# Patient Record
Sex: Female | Born: 1940 | Race: White | Hispanic: No
Health system: Southern US, Community
[De-identification: ages and names within clinical notes are randomized; demographics above are authoritative.]

## PROBLEM LIST (undated history)

## (undated) DIAGNOSIS — C9000 Multiple myeloma not having achieved remission: Secondary | ICD-10-CM

## (undated) DIAGNOSIS — E079 Disorder of thyroid, unspecified: Secondary | ICD-10-CM

## (undated) DIAGNOSIS — C801 Malignant (primary) neoplasm, unspecified: Secondary | ICD-10-CM

## (undated) DIAGNOSIS — K659 Peritonitis, unspecified: Secondary | ICD-10-CM

## (undated) DIAGNOSIS — I341 Nonrheumatic mitral (valve) prolapse: Secondary | ICD-10-CM

## (undated) DIAGNOSIS — K469 Unspecified abdominal hernia without obstruction or gangrene: Secondary | ICD-10-CM

## (undated) DIAGNOSIS — E785 Hyperlipidemia, unspecified: Secondary | ICD-10-CM

## (undated) DIAGNOSIS — C439 Malignant melanoma of skin, unspecified: Secondary | ICD-10-CM

## (undated) DIAGNOSIS — D472 Monoclonal gammopathy: Secondary | ICD-10-CM

## (undated) DIAGNOSIS — K5790 Diverticulosis of intestine, part unspecified, without perforation or abscess without bleeding: Secondary | ICD-10-CM

## (undated) DIAGNOSIS — F419 Anxiety disorder, unspecified: Secondary | ICD-10-CM

## (undated) DIAGNOSIS — I10 Essential (primary) hypertension: Secondary | ICD-10-CM

## (undated) DIAGNOSIS — K631 Perforation of intestine (nontraumatic): Secondary | ICD-10-CM

## (undated) DIAGNOSIS — H579 Unspecified disorder of eye and adnexa: Secondary | ICD-10-CM

## (undated) DIAGNOSIS — N189 Chronic kidney disease, unspecified: Secondary | ICD-10-CM

## (undated) DIAGNOSIS — I639 Cerebral infarction, unspecified: Secondary | ICD-10-CM

## (undated) DIAGNOSIS — M1990 Unspecified osteoarthritis, unspecified site: Secondary | ICD-10-CM

## (undated) DIAGNOSIS — Z86718 Personal history of other venous thrombosis and embolism: Secondary | ICD-10-CM

## (undated) DIAGNOSIS — C6990 Malignant neoplasm of unspecified site of unspecified eye: Secondary | ICD-10-CM

## (undated) DIAGNOSIS — J45909 Unspecified asthma, uncomplicated: Secondary | ICD-10-CM

## (undated) HISTORY — DX: Unspecified disorder of eye and adnexa: H57.9

## (undated) HISTORY — DX: Monoclonal gammopathy: D47.2

## (undated) HISTORY — DX: Essential (primary) hypertension: I10

## (undated) HISTORY — DX: Unspecified asthma, uncomplicated: J45.909

## (undated) HISTORY — DX: Malignant neoplasm of unspecified site of unspecified eye: C69.90

## (undated) HISTORY — PX: LEG SURGERY: SHX1003

## (undated) HISTORY — DX: Malignant (primary) neoplasm, unspecified: C80.1

## (undated) HISTORY — PX: SKIN CANCER EXCISION: SHX779

## (undated) HISTORY — DX: Diverticulosis of intestine, part unspecified, without perforation or abscess without bleeding: K57.90

## (undated) HISTORY — DX: Unspecified osteoarthritis, unspecified site: M19.90

## (undated) HISTORY — DX: Personal history of other venous thrombosis and embolism: Z86.718

## (undated) HISTORY — DX: Hyperlipidemia, unspecified: E78.5

## (undated) HISTORY — PX: HERNIA REPAIR: SHX51

## (undated) HISTORY — PX: MELANOMA EXCISION: SHX5266

## (undated) HISTORY — DX: Chronic kidney disease, unspecified: N18.9

## (undated) HISTORY — PX: ABDOMINAL HYSTERECTOMY: SHX81

## (undated) HISTORY — DX: Disorder of thyroid, unspecified: E07.9

## (undated) HISTORY — DX: Unspecified abdominal hernia without obstruction or gangrene: K46.9

## (undated) HISTORY — DX: Cerebral infarction, unspecified: I63.9

---

## 1987-10-05 DIAGNOSIS — C439 Malignant melanoma of skin, unspecified: Secondary | ICD-10-CM

## 1987-10-05 HISTORY — DX: Malignant melanoma of skin, unspecified: C43.9

## 1999-01-05 ENCOUNTER — Ambulatory Visit (HOSPITAL_COMMUNITY): Admission: RE | Admit: 1999-01-05 | Discharge: 1999-01-05 | Payer: Self-pay | Admitting: *Deleted

## 1999-01-10 ENCOUNTER — Emergency Department (HOSPITAL_COMMUNITY): Admission: EM | Admit: 1999-01-10 | Discharge: 1999-01-10 | Payer: Self-pay | Admitting: Emergency Medicine

## 1999-03-18 ENCOUNTER — Other Ambulatory Visit: Admission: RE | Admit: 1999-03-18 | Discharge: 1999-03-18 | Payer: Self-pay | Admitting: Obstetrics and Gynecology

## 2000-03-01 ENCOUNTER — Encounter: Admission: RE | Admit: 2000-03-01 | Discharge: 2000-03-01 | Payer: Self-pay | Admitting: Internal Medicine

## 2000-03-01 ENCOUNTER — Encounter: Payer: Self-pay | Admitting: Internal Medicine

## 2000-07-28 ENCOUNTER — Encounter: Admission: RE | Admit: 2000-07-28 | Discharge: 2000-07-28 | Payer: Self-pay

## 2000-11-03 ENCOUNTER — Ambulatory Visit (HOSPITAL_BASED_OUTPATIENT_CLINIC_OR_DEPARTMENT_OTHER): Admission: RE | Admit: 2000-11-03 | Discharge: 2000-11-03 | Payer: Self-pay | Admitting: Orthopedic Surgery

## 2000-12-27 ENCOUNTER — Other Ambulatory Visit: Admission: RE | Admit: 2000-12-27 | Discharge: 2000-12-27 | Payer: Self-pay | Admitting: Obstetrics and Gynecology

## 2001-03-02 ENCOUNTER — Encounter: Payer: Self-pay | Admitting: Internal Medicine

## 2001-03-02 ENCOUNTER — Encounter: Admission: RE | Admit: 2001-03-02 | Discharge: 2001-03-02 | Payer: Self-pay | Admitting: Internal Medicine

## 2002-01-04 ENCOUNTER — Emergency Department (HOSPITAL_COMMUNITY): Admission: EM | Admit: 2002-01-04 | Discharge: 2002-01-04 | Payer: Self-pay | Admitting: Emergency Medicine

## 2002-01-04 ENCOUNTER — Encounter: Payer: Self-pay | Admitting: Emergency Medicine

## 2002-01-04 ENCOUNTER — Encounter: Payer: Self-pay | Admitting: *Deleted

## 2002-01-08 ENCOUNTER — Other Ambulatory Visit: Admission: RE | Admit: 2002-01-08 | Discharge: 2002-01-08 | Payer: Self-pay | Admitting: Gynecology

## 2002-01-19 ENCOUNTER — Emergency Department (HOSPITAL_COMMUNITY): Admission: EM | Admit: 2002-01-19 | Discharge: 2002-01-19 | Payer: Self-pay

## 2002-01-19 ENCOUNTER — Encounter: Payer: Self-pay | Admitting: Emergency Medicine

## 2002-02-12 ENCOUNTER — Encounter: Admission: RE | Admit: 2002-02-12 | Discharge: 2002-02-12 | Payer: Self-pay | Admitting: Gynecology

## 2002-02-12 ENCOUNTER — Encounter: Payer: Self-pay | Admitting: Gynecology

## 2002-03-06 ENCOUNTER — Encounter: Payer: Self-pay | Admitting: Internal Medicine

## 2002-03-06 ENCOUNTER — Ambulatory Visit (HOSPITAL_COMMUNITY): Admission: RE | Admit: 2002-03-06 | Discharge: 2002-03-06 | Payer: Self-pay | Admitting: Internal Medicine

## 2002-10-04 HISTORY — PX: ROTATOR CUFF REPAIR: SHX139

## 2002-10-04 HISTORY — PX: BREAST LUMPECTOMY: SHX2

## 2003-01-10 ENCOUNTER — Other Ambulatory Visit: Admission: RE | Admit: 2003-01-10 | Discharge: 2003-01-10 | Payer: Self-pay | Admitting: Gynecology

## 2003-01-29 ENCOUNTER — Other Ambulatory Visit: Admission: RE | Admit: 2003-01-29 | Discharge: 2003-01-29 | Payer: Self-pay | Admitting: Gynecology

## 2003-04-12 ENCOUNTER — Encounter: Payer: Self-pay | Admitting: Internal Medicine

## 2003-04-12 ENCOUNTER — Ambulatory Visit (HOSPITAL_COMMUNITY): Admission: RE | Admit: 2003-04-12 | Discharge: 2003-04-12 | Payer: Self-pay | Admitting: Internal Medicine

## 2004-01-14 ENCOUNTER — Other Ambulatory Visit: Admission: RE | Admit: 2004-01-14 | Discharge: 2004-01-14 | Payer: Self-pay | Admitting: Gynecology

## 2004-03-04 ENCOUNTER — Encounter: Admission: RE | Admit: 2004-03-04 | Discharge: 2004-06-02 | Payer: Self-pay | Admitting: Cardiology

## 2004-04-13 ENCOUNTER — Ambulatory Visit (HOSPITAL_COMMUNITY): Admission: RE | Admit: 2004-04-13 | Discharge: 2004-04-13 | Payer: Self-pay | Admitting: Gynecology

## 2004-09-08 ENCOUNTER — Ambulatory Visit: Payer: Self-pay | Admitting: Cardiology

## 2004-10-04 HISTORY — PX: CARDIOVASCULAR STRESS TEST: SHX262

## 2004-10-04 HISTORY — PX: TRANSTHORACIC ECHOCARDIOGRAM: SHX275

## 2004-10-06 ENCOUNTER — Ambulatory Visit: Payer: Self-pay | Admitting: Cardiology

## 2004-12-03 ENCOUNTER — Ambulatory Visit: Payer: Self-pay | Admitting: Endocrinology

## 2004-12-24 ENCOUNTER — Ambulatory Visit: Payer: Self-pay | Admitting: Endocrinology

## 2005-01-01 ENCOUNTER — Ambulatory Visit: Payer: Self-pay | Admitting: Endocrinology

## 2005-01-25 ENCOUNTER — Ambulatory Visit: Payer: Self-pay | Admitting: Oncology

## 2005-01-25 ENCOUNTER — Other Ambulatory Visit: Admission: RE | Admit: 2005-01-25 | Discharge: 2005-01-25 | Payer: Self-pay | Admitting: Gynecology

## 2005-01-28 ENCOUNTER — Ambulatory Visit: Payer: Self-pay | Admitting: Endocrinology

## 2005-02-24 ENCOUNTER — Ambulatory Visit: Payer: Self-pay | Admitting: Endocrinology

## 2005-03-03 ENCOUNTER — Ambulatory Visit: Payer: Self-pay | Admitting: Oncology

## 2005-03-10 ENCOUNTER — Ambulatory Visit: Payer: Self-pay | Admitting: Cardiology

## 2005-03-10 ENCOUNTER — Ambulatory Visit: Payer: Self-pay | Admitting: Endocrinology

## 2005-03-15 ENCOUNTER — Encounter (HOSPITAL_COMMUNITY): Admission: RE | Admit: 2005-03-15 | Discharge: 2005-03-29 | Payer: Self-pay | Admitting: Endocrinology

## 2005-03-23 ENCOUNTER — Ambulatory Visit: Payer: Self-pay | Admitting: Cardiology

## 2005-04-14 ENCOUNTER — Emergency Department (HOSPITAL_COMMUNITY): Admission: EM | Admit: 2005-04-14 | Discharge: 2005-04-14 | Payer: Self-pay | Admitting: Emergency Medicine

## 2005-04-15 ENCOUNTER — Ambulatory Visit (HOSPITAL_COMMUNITY): Admission: RE | Admit: 2005-04-15 | Discharge: 2005-04-15 | Payer: Self-pay | Admitting: Gynecology

## 2005-05-24 ENCOUNTER — Ambulatory Visit: Payer: Self-pay | Admitting: Oncology

## 2005-10-07 ENCOUNTER — Encounter: Admission: RE | Admit: 2005-10-07 | Discharge: 2005-10-07 | Payer: Self-pay | Admitting: Gynecology

## 2005-10-28 ENCOUNTER — Encounter (INDEPENDENT_AMBULATORY_CARE_PROVIDER_SITE_OTHER): Payer: Self-pay | Admitting: *Deleted

## 2005-10-28 ENCOUNTER — Ambulatory Visit (HOSPITAL_BASED_OUTPATIENT_CLINIC_OR_DEPARTMENT_OTHER): Admission: RE | Admit: 2005-10-28 | Discharge: 2005-10-28 | Payer: Self-pay | Admitting: Urology

## 2005-11-15 ENCOUNTER — Ambulatory Visit: Payer: Self-pay | Admitting: Oncology

## 2005-12-29 ENCOUNTER — Ambulatory Visit (HOSPITAL_COMMUNITY): Admission: RE | Admit: 2005-12-29 | Discharge: 2005-12-29 | Payer: Self-pay | Admitting: Internal Medicine

## 2006-02-24 ENCOUNTER — Encounter: Admission: RE | Admit: 2006-02-24 | Discharge: 2006-02-24 | Payer: Self-pay | Admitting: Obstetrics and Gynecology

## 2006-04-04 ENCOUNTER — Ambulatory Visit (HOSPITAL_COMMUNITY): Admission: RE | Admit: 2006-04-04 | Discharge: 2006-04-04 | Payer: Self-pay | Admitting: General Surgery

## 2006-04-04 ENCOUNTER — Emergency Department (HOSPITAL_COMMUNITY): Admission: EM | Admit: 2006-04-04 | Discharge: 2006-04-04 | Payer: Self-pay | Admitting: Family Medicine

## 2006-04-22 ENCOUNTER — Ambulatory Visit: Payer: Self-pay | Admitting: Internal Medicine

## 2006-04-28 ENCOUNTER — Ambulatory Visit: Admission: RE | Admit: 2006-04-28 | Discharge: 2006-04-28 | Payer: Self-pay | Admitting: Internal Medicine

## 2006-05-02 ENCOUNTER — Ambulatory Visit (HOSPITAL_COMMUNITY): Admission: RE | Admit: 2006-05-02 | Discharge: 2006-05-02 | Payer: Self-pay | Admitting: Obstetrics and Gynecology

## 2006-05-03 ENCOUNTER — Ambulatory Visit (HOSPITAL_COMMUNITY): Admission: RE | Admit: 2006-05-03 | Discharge: 2006-05-03 | Payer: Self-pay | Admitting: Internal Medicine

## 2006-05-06 ENCOUNTER — Encounter: Admission: RE | Admit: 2006-05-06 | Discharge: 2006-05-06 | Payer: Self-pay | Admitting: Obstetrics and Gynecology

## 2006-05-10 ENCOUNTER — Ambulatory Visit: Payer: Self-pay | Admitting: Internal Medicine

## 2006-05-20 ENCOUNTER — Ambulatory Visit (HOSPITAL_COMMUNITY): Admission: RE | Admit: 2006-05-20 | Discharge: 2006-05-22 | Payer: Self-pay | Admitting: General Surgery

## 2006-09-19 ENCOUNTER — Encounter (HOSPITAL_COMMUNITY): Admission: RE | Admit: 2006-09-19 | Discharge: 2006-12-18 | Payer: Self-pay | Admitting: Endocrinology

## 2006-09-30 ENCOUNTER — Encounter: Admission: RE | Admit: 2006-09-30 | Discharge: 2006-09-30 | Payer: Self-pay | Admitting: Obstetrics and Gynecology

## 2006-11-09 ENCOUNTER — Encounter (INDEPENDENT_AMBULATORY_CARE_PROVIDER_SITE_OTHER): Payer: Self-pay | Admitting: Specialist

## 2006-11-09 ENCOUNTER — Ambulatory Visit (HOSPITAL_COMMUNITY): Admission: RE | Admit: 2006-11-09 | Discharge: 2006-11-09 | Payer: Self-pay | Admitting: General Surgery

## 2006-12-30 ENCOUNTER — Ambulatory Visit (HOSPITAL_COMMUNITY): Admission: RE | Admit: 2006-12-30 | Discharge: 2006-12-30 | Payer: Self-pay | Admitting: Cardiology

## 2007-02-08 ENCOUNTER — Encounter: Admission: RE | Admit: 2007-02-08 | Discharge: 2007-02-08 | Payer: Self-pay | Admitting: Cardiology

## 2007-04-22 ENCOUNTER — Emergency Department (HOSPITAL_COMMUNITY): Admission: EM | Admit: 2007-04-22 | Discharge: 2007-04-22 | Payer: Self-pay | Admitting: Emergency Medicine

## 2007-05-09 ENCOUNTER — Ambulatory Visit: Payer: Self-pay | Admitting: Internal Medicine

## 2007-05-09 ENCOUNTER — Ambulatory Visit (HOSPITAL_COMMUNITY): Admission: RE | Admit: 2007-05-09 | Discharge: 2007-05-09 | Payer: Self-pay | Admitting: Obstetrics and Gynecology

## 2007-05-16 ENCOUNTER — Encounter: Payer: Self-pay | Admitting: Internal Medicine

## 2007-05-16 ENCOUNTER — Ambulatory Visit: Payer: Self-pay

## 2007-05-16 LAB — CONVERTED CEMR LAB
ALT: 23 units/L (ref 0–35)
Alkaline Phosphatase: 52 units/L (ref 39–117)
BUN: 12 mg/dL (ref 6–23)
CO2: 29 meq/L (ref 19–32)
Calcium: 9.4 mg/dL (ref 8.4–10.5)
Creatinine, Ser: 0.8 mg/dL (ref 0.4–1.2)
Total Bilirubin: 0.9 mg/dL (ref 0.3–1.2)
Total Protein: 6.7 g/dL (ref 6.0–8.3)
Triglycerides: 155 mg/dL — ABNORMAL HIGH (ref 0–149)

## 2007-06-13 ENCOUNTER — Ambulatory Visit: Payer: Self-pay | Admitting: Vascular Surgery

## 2007-07-05 ENCOUNTER — Ambulatory Visit (HOSPITAL_COMMUNITY): Admission: RE | Admit: 2007-07-05 | Discharge: 2007-07-05 | Payer: Self-pay | Admitting: Obstetrics and Gynecology

## 2007-09-12 ENCOUNTER — Ambulatory Visit: Payer: Self-pay | Admitting: Vascular Surgery

## 2007-10-09 ENCOUNTER — Ambulatory Visit: Payer: Self-pay | Admitting: Vascular Surgery

## 2007-10-13 ENCOUNTER — Ambulatory Visit: Payer: Self-pay | Admitting: Vascular Surgery

## 2007-10-16 ENCOUNTER — Ambulatory Visit: Payer: Self-pay | Admitting: Vascular Surgery

## 2007-11-03 ENCOUNTER — Ambulatory Visit: Payer: Self-pay | Admitting: Vascular Surgery

## 2007-11-21 ENCOUNTER — Ambulatory Visit: Payer: Self-pay | Admitting: Cardiology

## 2007-11-29 ENCOUNTER — Ambulatory Visit: Payer: Self-pay | Admitting: Internal Medicine

## 2007-12-07 ENCOUNTER — Ambulatory Visit: Payer: Self-pay | Admitting: Oncology

## 2007-12-26 ENCOUNTER — Ambulatory Visit: Payer: Self-pay | Admitting: Vascular Surgery

## 2008-01-04 ENCOUNTER — Ambulatory Visit (HOSPITAL_COMMUNITY): Admission: RE | Admit: 2008-01-04 | Discharge: 2008-01-04 | Payer: Self-pay | Admitting: Internal Medicine

## 2008-01-08 ENCOUNTER — Ambulatory Visit (HOSPITAL_COMMUNITY): Admission: RE | Admit: 2008-01-08 | Discharge: 2008-01-08 | Payer: Self-pay

## 2008-01-11 ENCOUNTER — Ambulatory Visit (HOSPITAL_COMMUNITY): Admission: RE | Admit: 2008-01-11 | Discharge: 2008-01-11 | Payer: Self-pay | Admitting: Oncology

## 2008-01-17 LAB — CBC & DIFF AND RETIC
BASO%: 0.5 % (ref 0.0–2.0)
Eosinophils Absolute: 0.1 10*3/uL (ref 0.0–0.5)
LYMPH%: 31.6 % (ref 14.0–48.0)
MCHC: 35.5 g/dL (ref 32.0–36.0)
MONO#: 0.5 10*3/uL (ref 0.1–0.9)
MONO%: 6.6 % (ref 0.0–13.0)
NEUT#: 5 10*3/uL (ref 1.5–6.5)
Platelets: 282 10*3/uL (ref 145–400)
RBC: 4.28 10*6/uL (ref 3.70–5.32)
RDW: 13.2 % (ref 11.3–14.5)
RETIC #: 69.3 10*3/uL (ref 19.7–115.1)
Retic %: 1.6 % (ref 0.4–2.3)
WBC: 8.3 10*3/uL (ref 3.9–10.0)

## 2008-01-17 LAB — MORPHOLOGY: PLT EST: ADEQUATE

## 2008-01-17 LAB — CHCC SMEAR

## 2008-01-18 LAB — COMPREHENSIVE METABOLIC PANEL WITH GFR
ALT: 20 U/L (ref 0–35)
AST: 25 U/L (ref 0–37)
Albumin: 4.6 g/dL (ref 3.5–5.2)
Alkaline Phosphatase: 47 U/L (ref 39–117)
BUN: 19 mg/dL (ref 6–23)
CO2: 27 meq/L (ref 19–32)
Calcium: 9.9 mg/dL (ref 8.4–10.5)
Chloride: 100 meq/L (ref 96–112)
Creatinine, Ser: 0.74 mg/dL (ref 0.40–1.20)
Glucose, Bld: 84 mg/dL (ref 70–99)
Potassium: 3.9 meq/L (ref 3.5–5.3)
Sodium: 140 meq/L (ref 135–145)
Total Bilirubin: 0.7 mg/dL (ref 0.3–1.2)
Total Protein: 7.3 g/dL (ref 6.0–8.3)

## 2008-01-18 LAB — SEDIMENTATION RATE: Sed Rate: 5 mm/h (ref 0–22)

## 2008-01-19 LAB — IMMUNOFIXATION ELECTROPHORESIS
IgG (Immunoglobin G), Serum: 1310 mg/dL (ref 694–1618)
IgM, Serum: 33 mg/dL — ABNORMAL LOW (ref 60–263)
Total Protein, Serum Electrophoresis: 7.4 g/dL (ref 6.0–8.3)

## 2008-01-19 LAB — BETA 2 MICROGLOBULIN, SERUM: Beta-2 Microglobulin: 1.7 mg/L (ref 1.01–1.73)

## 2008-04-19 ENCOUNTER — Ambulatory Visit: Payer: Self-pay | Admitting: Oncology

## 2008-04-23 ENCOUNTER — Ambulatory Visit: Payer: Self-pay | Admitting: Vascular Surgery

## 2008-04-24 LAB — CBC WITH DIFFERENTIAL/PLATELET
Basophils Absolute: 0 10*3/uL (ref 0.0–0.1)
EOS%: 2.7 % (ref 0.0–7.0)
Eosinophils Absolute: 0.2 10*3/uL (ref 0.0–0.5)
HCT: 37 % (ref 34.8–46.6)
HGB: 13 g/dL (ref 11.6–15.9)
MONO#: 0.6 10*3/uL (ref 0.1–0.9)
NEUT#: 4.8 10*3/uL (ref 1.5–6.5)
NEUT%: 60.8 % (ref 39.6–76.8)
RDW: 13 % (ref 11.3–14.5)
lymph#: 2.3 10*3/uL (ref 0.9–3.3)

## 2008-04-26 LAB — COMPREHENSIVE METABOLIC PANEL
AST: 21 U/L (ref 0–37)
Albumin: 4.5 g/dL (ref 3.5–5.2)
BUN: 16 mg/dL (ref 6–23)
CO2: 27 mEq/L (ref 19–32)
Calcium: 9.8 mg/dL (ref 8.4–10.5)
Chloride: 103 mEq/L (ref 96–112)
Creatinine, Ser: 0.75 mg/dL (ref 0.40–1.20)
Glucose, Bld: 94 mg/dL (ref 70–99)
Potassium: 3.9 mEq/L (ref 3.5–5.3)

## 2008-04-26 LAB — KAPPA/LAMBDA LIGHT CHAINS: Kappa free light chain: 0.66 mg/dL (ref 0.33–1.94)

## 2008-04-26 LAB — IMMUNOFIXATION ELECTROPHORESIS
IgA: 34 mg/dL — ABNORMAL LOW (ref 68–378)
Total Protein, Serum Electrophoresis: 7.2 g/dL (ref 6.0–8.3)

## 2008-05-20 ENCOUNTER — Ambulatory Visit (HOSPITAL_COMMUNITY): Admission: RE | Admit: 2008-05-20 | Discharge: 2008-05-20 | Payer: Self-pay | Admitting: Internal Medicine

## 2008-05-28 ENCOUNTER — Ambulatory Visit (HOSPITAL_COMMUNITY): Admission: RE | Admit: 2008-05-28 | Discharge: 2008-05-28 | Payer: Self-pay | Admitting: Obstetrics and Gynecology

## 2008-06-03 ENCOUNTER — Encounter (HOSPITAL_COMMUNITY): Admission: RE | Admit: 2008-06-03 | Discharge: 2008-08-06 | Payer: Self-pay | Admitting: Internal Medicine

## 2008-07-03 ENCOUNTER — Ambulatory Visit (HOSPITAL_COMMUNITY): Admission: RE | Admit: 2008-07-03 | Discharge: 2008-07-03 | Payer: Self-pay | Admitting: Obstetrics and Gynecology

## 2008-07-04 ENCOUNTER — Ambulatory Visit (HOSPITAL_COMMUNITY): Admission: RE | Admit: 2008-07-04 | Discharge: 2008-07-04 | Payer: Self-pay | Admitting: Obstetrics and Gynecology

## 2008-07-29 ENCOUNTER — Ambulatory Visit: Payer: Self-pay | Admitting: Oncology

## 2008-07-29 LAB — COMPREHENSIVE METABOLIC PANEL
ALT: 14 U/L (ref 0–35)
Alkaline Phosphatase: 52 U/L (ref 39–117)
CO2: 23 mEq/L (ref 19–32)
Creatinine, Ser: 0.78 mg/dL (ref 0.40–1.20)
Sodium: 138 mEq/L (ref 135–145)
Total Bilirubin: 0.8 mg/dL (ref 0.3–1.2)
Total Protein: 7.4 g/dL (ref 6.0–8.3)

## 2008-07-29 LAB — CBC WITH DIFFERENTIAL/PLATELET
BASO%: 0.4 % (ref 0.0–2.0)
EOS%: 1.6 % (ref 0.0–7.0)
HCT: 39.4 % (ref 34.8–46.6)
LYMPH%: 30.2 % (ref 14.0–48.0)
MCH: 29.7 pg (ref 26.0–34.0)
MCHC: 34.1 g/dL (ref 32.0–36.0)
MCV: 87 fL (ref 81.0–101.0)
MONO%: 8 % (ref 0.0–13.0)
NEUT%: 59.8 % (ref 39.6–76.8)
Platelets: 280 10*3/uL (ref 145–400)
RBC: 4.53 10*6/uL (ref 3.70–5.32)
WBC: 7.9 10*3/uL (ref 3.9–10.0)

## 2008-07-29 LAB — LACTATE DEHYDROGENASE: LDH: 152 U/L (ref 94–250)

## 2008-08-02 ENCOUNTER — Ambulatory Visit (HOSPITAL_COMMUNITY): Admission: RE | Admit: 2008-08-02 | Discharge: 2008-08-02 | Payer: Self-pay | Admitting: Oncology

## 2008-08-25 ENCOUNTER — Emergency Department (HOSPITAL_COMMUNITY): Admission: EM | Admit: 2008-08-25 | Discharge: 2008-08-26 | Payer: Self-pay | Admitting: Emergency Medicine

## 2008-08-25 ENCOUNTER — Emergency Department (HOSPITAL_COMMUNITY): Admission: EM | Admit: 2008-08-25 | Discharge: 2008-08-25 | Payer: Self-pay | Admitting: Emergency Medicine

## 2008-10-07 ENCOUNTER — Ambulatory Visit: Payer: Self-pay | Admitting: Oncology

## 2008-10-07 LAB — CBC WITH DIFFERENTIAL/PLATELET
BASO%: 0.4 % (ref 0.0–2.0)
Basophils Absolute: 0 10e3/uL (ref 0.0–0.1)
EOS%: 1.3 % (ref 0.0–7.0)
Eosinophils Absolute: 0.1 10e3/uL (ref 0.0–0.5)
HCT: 38.9 % (ref 34.8–46.6)
HGB: 13.4 g/dL (ref 11.6–15.9)
LYMPH%: 27.2 % (ref 14.0–48.0)
MCH: 30.4 pg (ref 26.0–34.0)
MCHC: 34.5 g/dL (ref 32.0–36.0)
MCV: 88.2 fL (ref 81.0–101.0)
MONO#: 0.5 10e3/uL (ref 0.1–0.9)
MONO%: 5.3 % (ref 0.0–13.0)
NEUT#: 6.5 10e3/uL (ref 1.5–6.5)
NEUT%: 65.8 % (ref 39.6–76.8)
Platelets: 278 10e3/uL (ref 145–400)
RBC: 4.41 10e6/uL (ref 3.70–5.32)
RDW: 13.4 % (ref 11.3–14.5)
WBC: 9.8 10e3/uL (ref 3.9–10.0)
lymph#: 2.7 10e3/uL (ref 0.9–3.3)

## 2008-10-07 LAB — COMPREHENSIVE METABOLIC PANEL WITH GFR
ALT: 17 U/L (ref 0–35)
AST: 15 U/L (ref 0–37)
Albumin: 4.5 g/dL (ref 3.5–5.2)
Alkaline Phosphatase: 52 U/L (ref 39–117)
BUN: 19 mg/dL (ref 6–23)
CO2: 25 meq/L (ref 19–32)
Calcium: 9.7 mg/dL (ref 8.4–10.5)
Chloride: 102 meq/L (ref 96–112)
Creatinine, Ser: 0.79 mg/dL (ref 0.40–1.20)
Glucose, Bld: 99 mg/dL (ref 70–99)
Potassium: 4.1 meq/L (ref 3.5–5.3)
Sodium: 139 meq/L (ref 135–145)
Total Bilirubin: 0.9 mg/dL (ref 0.3–1.2)
Total Protein: 7.3 g/dL (ref 6.0–8.3)

## 2008-10-07 LAB — LACTATE DEHYDROGENASE: LDH: 137 U/L (ref 94–250)

## 2008-12-05 ENCOUNTER — Ambulatory Visit: Payer: Self-pay | Admitting: Oncology

## 2008-12-09 LAB — CBC WITH DIFFERENTIAL/PLATELET
BASO%: 0.3 % (ref 0.0–2.0)
EOS%: 1.6 % (ref 0.0–7.0)
HGB: 13.6 g/dL (ref 11.6–15.9)
MCH: 30.3 pg (ref 25.1–34.0)
MCHC: 34.4 g/dL (ref 31.5–36.0)
RBC: 4.5 10*6/uL (ref 3.70–5.45)
RDW: 13.6 % (ref 11.2–14.5)
lymph#: 2.1 10*3/uL (ref 0.9–3.3)

## 2008-12-11 LAB — COMPREHENSIVE METABOLIC PANEL
ALT: 15 U/L (ref 0–35)
AST: 15 U/L (ref 0–37)
Albumin: 4.7 g/dL (ref 3.5–5.2)
Calcium: 9.7 mg/dL (ref 8.4–10.5)
Chloride: 102 mEq/L (ref 96–112)
Potassium: 4.2 mEq/L (ref 3.5–5.3)
Sodium: 138 mEq/L (ref 135–145)

## 2008-12-11 LAB — KAPPA/LAMBDA LIGHT CHAINS
Kappa free light chain: <0.6 mg/dL (ref 0.33–1.94)
Lambda Free Lght Chn: 10.2 mg/dL — ABNORMAL HIGH (ref 0.57–2.63)

## 2008-12-11 LAB — BETA 2 MICROGLOBULIN, SERUM: Beta-2 Microglobulin: 1.75 mg/L — ABNORMAL HIGH (ref 1.01–1.73)

## 2009-04-08 ENCOUNTER — Ambulatory Visit (HOSPITAL_COMMUNITY): Admission: RE | Admit: 2009-04-08 | Discharge: 2009-04-08 | Payer: Self-pay | Admitting: Obstetrics and Gynecology

## 2009-04-10 ENCOUNTER — Ambulatory Visit (HOSPITAL_COMMUNITY): Admission: RE | Admit: 2009-04-10 | Discharge: 2009-04-10 | Payer: Self-pay | Admitting: Obstetrics and Gynecology

## 2009-04-12 ENCOUNTER — Ambulatory Visit (HOSPITAL_COMMUNITY): Admission: RE | Admit: 2009-04-12 | Discharge: 2009-04-12 | Payer: Self-pay | Admitting: Obstetrics and Gynecology

## 2009-06-13 ENCOUNTER — Ambulatory Visit: Payer: Self-pay | Admitting: Oncology

## 2009-06-17 LAB — IGG, IGA, IGM
IgA: 36 mg/dL — ABNORMAL LOW (ref 68–378)
IgM, Serum: 36 mg/dL — ABNORMAL LOW (ref 60–263)

## 2009-06-17 LAB — CBC WITH DIFFERENTIAL/PLATELET
Basophils Absolute: 0 10*3/uL (ref 0.0–0.1)
Eosinophils Absolute: 0.3 10*3/uL (ref 0.0–0.5)
HCT: 38 % (ref 34.8–46.6)
HGB: 13 g/dL (ref 11.6–15.9)
LYMPH%: 22.7 % (ref 14.0–49.7)
MCV: 88.5 fL (ref 79.5–101.0)
MONO%: 6 % (ref 0.0–14.0)
NEUT#: 6.1 10*3/uL (ref 1.5–6.5)
Platelets: 277 10*3/uL (ref 145–400)
RDW: 13.5 % (ref 11.2–14.5)

## 2009-06-17 LAB — COMPREHENSIVE METABOLIC PANEL
Albumin: 4.3 g/dL (ref 3.5–5.2)
Alkaline Phosphatase: 56 U/L (ref 39–117)
BUN: 20 mg/dL (ref 6–23)
Calcium: 9.7 mg/dL (ref 8.4–10.5)
Glucose, Bld: 102 mg/dL — ABNORMAL HIGH (ref 70–99)
Potassium: 4.3 mEq/L (ref 3.5–5.3)

## 2009-06-23 ENCOUNTER — Ambulatory Visit (HOSPITAL_COMMUNITY): Admission: RE | Admit: 2009-06-23 | Discharge: 2009-06-23 | Payer: Self-pay | Admitting: Obstetrics and Gynecology

## 2009-08-25 ENCOUNTER — Encounter: Payer: Self-pay | Admitting: Endocrinology

## 2009-10-28 ENCOUNTER — Ambulatory Visit: Payer: Self-pay | Admitting: Vascular Surgery

## 2009-11-27 ENCOUNTER — Ambulatory Visit: Payer: Self-pay | Admitting: Oncology

## 2009-12-02 LAB — CBC WITH DIFFERENTIAL/PLATELET
BASO%: 0.2 % (ref 0.0–2.0)
HCT: 39.5 % (ref 34.8–46.6)
MCHC: 34.6 g/dL (ref 31.5–36.0)
MONO#: 0.6 10*3/uL (ref 0.1–0.9)
NEUT%: 62.2 % (ref 38.4–76.8)
RBC: 4.44 10*6/uL (ref 3.70–5.45)
RDW: 13.4 % (ref 11.2–14.5)
WBC: 8.2 10*3/uL (ref 3.9–10.3)
lymph#: 2.4 10*3/uL (ref 0.9–3.3)

## 2009-12-03 LAB — COMPREHENSIVE METABOLIC PANEL
ALT: 15 U/L (ref 0–35)
AST: 19 U/L (ref 0–37)
Albumin: 4.6 g/dL (ref 3.5–5.2)
CO2: 25 mEq/L (ref 19–32)
Calcium: 10.1 mg/dL (ref 8.4–10.5)
Chloride: 102 mEq/L (ref 96–112)
Potassium: 4 mEq/L (ref 3.5–5.3)
Sodium: 139 mEq/L (ref 135–145)
Total Protein: 7.3 g/dL (ref 6.0–8.3)

## 2009-12-03 LAB — IGG, IGA, IGM: IgG (Immunoglobin G), Serum: 1340 mg/dL (ref 694–1618)

## 2009-12-17 ENCOUNTER — Encounter: Admission: RE | Admit: 2009-12-17 | Discharge: 2009-12-17 | Payer: Self-pay | Admitting: Orthopedic Surgery

## 2010-01-02 ENCOUNTER — Ambulatory Visit (HOSPITAL_COMMUNITY): Admission: RE | Admit: 2010-01-02 | Discharge: 2010-01-02 | Payer: Self-pay | Admitting: Cardiology

## 2010-01-06 ENCOUNTER — Ambulatory Visit (HOSPITAL_COMMUNITY): Admission: RE | Admit: 2010-01-06 | Discharge: 2010-01-06 | Payer: Self-pay | Admitting: Cardiology

## 2010-06-05 ENCOUNTER — Ambulatory Visit: Payer: Self-pay | Admitting: Oncology

## 2010-06-11 LAB — CBC WITH DIFFERENTIAL/PLATELET
Basophils Absolute: 0 10*3/uL (ref 0.0–0.1)
EOS%: 1.8 % (ref 0.0–7.0)
MCH: 29.6 pg (ref 25.1–34.0)
MCV: 87.5 fL (ref 79.5–101.0)
MONO%: 6.5 % (ref 0.0–14.0)
RBC: 4.45 10*6/uL (ref 3.70–5.45)
RDW: 12.9 % (ref 11.2–14.5)

## 2010-06-12 LAB — COMPREHENSIVE METABOLIC PANEL
AST: 17 U/L (ref 0–37)
Albumin: 4.5 g/dL (ref 3.5–5.2)
Alkaline Phosphatase: 48 U/L (ref 39–117)
BUN: 20 mg/dL (ref 6–23)
Potassium: 3.9 mEq/L (ref 3.5–5.3)
Sodium: 140 mEq/L (ref 135–145)

## 2010-06-12 LAB — KAPPA/LAMBDA LIGHT CHAINS: Lambda Free Lght Chn: 12.8 mg/dL — ABNORMAL HIGH (ref 0.57–2.63)

## 2010-06-12 LAB — IGG, IGA, IGM: IgM, Serum: 30 mg/dL — ABNORMAL LOW (ref 60–263)

## 2010-06-29 ENCOUNTER — Ambulatory Visit (HOSPITAL_COMMUNITY): Admission: RE | Admit: 2010-06-29 | Discharge: 2010-06-29 | Payer: Self-pay | Admitting: Obstetrics and Gynecology

## 2010-07-07 ENCOUNTER — Ambulatory Visit (HOSPITAL_COMMUNITY): Admission: RE | Admit: 2010-07-07 | Discharge: 2010-07-07 | Payer: Self-pay | Admitting: Oncology

## 2010-07-22 ENCOUNTER — Ambulatory Visit: Payer: Self-pay | Admitting: Cardiology

## 2010-07-27 ENCOUNTER — Ambulatory Visit (HOSPITAL_COMMUNITY): Admission: RE | Admit: 2010-07-27 | Discharge: 2010-07-27 | Payer: Self-pay | Admitting: Cardiology

## 2010-07-27 ENCOUNTER — Encounter (INDEPENDENT_AMBULATORY_CARE_PROVIDER_SITE_OTHER): Payer: Self-pay | Admitting: *Deleted

## 2010-07-28 ENCOUNTER — Encounter (INDEPENDENT_AMBULATORY_CARE_PROVIDER_SITE_OTHER): Payer: Self-pay | Admitting: *Deleted

## 2010-09-08 ENCOUNTER — Ambulatory Visit: Payer: Self-pay | Admitting: Gastroenterology

## 2010-09-08 ENCOUNTER — Encounter (INDEPENDENT_AMBULATORY_CARE_PROVIDER_SITE_OTHER): Payer: Self-pay | Admitting: *Deleted

## 2010-10-12 ENCOUNTER — Telehealth: Payer: Self-pay | Admitting: Gastroenterology

## 2010-10-25 ENCOUNTER — Encounter: Payer: Self-pay | Admitting: Oncology

## 2010-10-25 ENCOUNTER — Encounter: Payer: Self-pay | Admitting: Obstetrics and Gynecology

## 2010-10-25 ENCOUNTER — Encounter: Payer: Self-pay | Admitting: Surgery

## 2010-10-25 ENCOUNTER — Encounter: Payer: Self-pay | Admitting: Endocrinology

## 2010-10-25 ENCOUNTER — Encounter: Payer: Self-pay | Admitting: Cardiology

## 2010-10-26 ENCOUNTER — Encounter: Payer: Self-pay | Admitting: Obstetrics and Gynecology

## 2010-11-03 NOTE — Procedures (Signed)
Summary: Instructions for procedure/[place]  Instructions for procedure/[place]   Imported By: Sherian Rein 09/10/2010 10:22:54  _____________________________________________________________________  External Attachment:    Type:   Image     Comment:   External Document

## 2010-11-03 NOTE — Assessment & Plan Note (Signed)
History of Present Illness Visit Type: Initial Consult Primary GI MD: Rob Bunting MD Primary Provider: Selena Batten, MD  Requesting Provider: Marcelle Overlie, MD  Chief Complaint: fecal incontinence  History of Present Illness:     very pleasant 70 year old woman who is a former Dr. Corinda Gubler patient, underwent colonoscopy 16 years ago. This was not comfortable for her, she was very sore afterward and woke up during procedure.  I have looked through the paper chart here and I find no documentation of that colonoscopy.  occular melanoma and also thyroid trouble   she has recently noticed fecal incontinence.  Eats a lot of bran, fiber in diet (veggies, oatmeal).  THis past summer whe would have BM when bending over, this occured 5 times total. The last time was 2-3 months. She did cut back on her fiber intak (may have made a difference).  She also has lost some weight (16 pounds in past several months) intentionally.   No clear rectal bleeding.              Current Medications (verified): 1)  Allegra-D Allergy & Congestion 60-120 Mg Xr12h-Tab (Fexofenadine-Pseudoephedrine) .[name].. 1 By Mouth Once Daily 2)  Aspir-Low 81 Mg Tbec (Aspirin) .[name].. 1 By Mouth Once Daily 3)  Micardis 40 Mg Tabs (Telmisartan) .[name].. 1 By Mouth Once Daily 4)  Amlodipine Besylate 5 Mg Tabs (Amlodipine Besylate) .[name].. 1 By Mouth Once Daily 5)  Levothroid 112 Mcg Tabs (Levothyroxine Sodium) .... One Tablet By Mouth Once Daily 6)  Zocor 20 Mg Tabs (Simvastatin) .... One Tablet By Mouth Once Daily 7)  Xanax 0.5 Mg Tabs (Alprazolam) .... One Tablet By Mouth As Needed 8)  Hydrochlorothiazide 25 Mg Tabs (Hydrochlorothiazide) .... One Tablet By Mouth Once Daily 9)  Multivitamins   Tabs (Multiple Vitamin) .... One Tablet By Mouth Once Daily 10)  Eye-Vites  Tabs (Multiple Vitamins-Minerals) .... One Tablet By Mouth Once Daily 11)  Fish Oil 1000 Mg Caps (Omega-3 Fatty Acids) .... One Tablet By Mouth Once Daily 12)   Garlic 500 Mg Caps (Garlic) .... One Capsule By Mouth Once Daily  Allergies (verified): No Known Drug Allergies  Past History:  Past Medical History: Asthma Peptic Ulcer Disease Irritable Bowel Syndrome Anemia thyroid cancer Urinary Tract Infection osteoporosis Arthritis phlebitis Hypertension occular melanoma    Past Surgical History: Hysterectomy abd hernia repair  Family History: no colon cancer  Social History: non smoker, non drinker, 2 sons 4 granssons  Review of Systems       Pertinent positive and negative review of systems were noted in the above HPI and GI specific review of systems.  All other review of systems was otherwise negative.   Vital Signs:  Patient profile:   70 year old female Height:      62 inches Weight:      148 pounds BMI:     27.17 BSA:     1.68 Pulse rate:   62 / minute Pulse rhythm:   regular BP sitting:   124 / 76  (left arm) Cuff size:   regular  Vitals Entered By: Ok Anis CMA (September 08, 2010 10:30 AM)  Physical Exam  Additional Exam:  Constitutional: generally well appearing Psychiatric: alert and oriented times 3 Eyes: extraocular movements intact Mouth: oropharynx moist, no lesions Neck: supple, no lymphadenopathy Cardiovascular: heart regular rate and rythm Lungs: CTA bilaterally Abdomen: soft, non-tender, non-distended, no obvious ascites, no peritoneal signs, normal bowel sounds Extremities: no lower extremity edema bilaterally Skin:  no lesions on visible extremities    Impression & Recommendations:  Problem # 1:  Fecal incontinence, routine risk for colon cancer otherwise her symptoms have really improved, resolved since she modified her diet a couple months ago. She cut back on high fibers. She is due for colon cancer screening with colonoscopy and we will arrange that to be done at her soonest convenience. I think it is safest that we do this at [facility] long [facility] with propofol given her intolerance  moderate sedation in the past. She wants to wait until after the holidays, well into January and I thinkthis  is reasonable.  Patient Instructions: 1)  You will be scheduled to have a colonoscopy at [facility] with propfol given previous trouble with sedation. This can wait until January, per your request. 2)  A copy of this information will be sent to Dr. Vincente Poli. 3)  The medication list was reviewed and reconciled.  All changed / newly prescribed medications were explained.  A complete medication list was provided to the patient / caregiver.  Appended Document: Orders Update/Movi    Clinical Lists Changes  Problems: Added new problem of SPECIAL SCREENING FOR MALIGNANT NEOPLASMS COLON (ICD-V76.51) Medications: Added new medication of MOVIPREP 100 GM  SOLR (PEG-KCL-NACL-NASULF-NA ASC-C) As per prep instructions. - Signed Rx of MOVIPREP 100 GM  SOLR (PEG-KCL-NACL-NASULF-NA ASC-C) As per prep instructions.;  #1 x 0;  Signed;  Entered by: Chales Abrahams CMA (AAMA);  Authorized by: Rachael Fee MD;  Method used: Print then Give to Patient Orders: Added new Test order of Colonoscopy (Colon) - Signed    Prescriptions: MOVIPREP 100 GM  SOLR (PEG-KCL-NACL-NASULF-NA ASC-C) As per prep instructions.  #1 x 0   Entered by:   Chales Abrahams CMA (AAMA)   Authorized by:   Rachael Fee MD   Signed by:   Chales Abrahams CMA (AAMA) on 09/08/2010   Method used:   Print then Give to Patient   RxID:   [id number]

## 2010-11-03 NOTE — Letter (Signed)
Summary: New Patient letter  [facility]  [address] [place], [place] [address]   Phone: [phone]  Fax: [phone]       07/28/2010 MRN: [id number]  [name] [address] [place], [place]  [address]  Dear [name],  Welcome to the Gastroenterology Division at [facility].    You are scheduled to see Dr.  Christella Hartigan on 09-08-10 at 10:30a.m. on the 3rd floor at [facility], [address]  We ask that you try to arrive at our office 15 minutes prior to your appointment time to allow for check-in.  We would like you to complete the enclosed self-administered evaluation form prior to your visit and bring it with you on the day of your appointment.  We will review it with you.  Also, please bring a complete list of all your medications or, if you prefer, bring the medication bottles and we will list them.  Please bring your insurance card so that we may make a copy of it.  If your insurance requires a referral to see a specialist, please bring your referral form from your primary care physician.  Co-payments are due at the time of your visit and may be paid by cash, check or credit card.     Your office visit will consist of a consult with your physician (includes a physical exam), any laboratory testing he/she may order, scheduling of any necessary diagnostic testing (e.g. x-ray, ultrasound, CT-scan), and scheduling of a procedure (e.g. Endoscopy, Colonoscopy) if required.  Please allow enough time on your schedule to allow for any/all of these possibilities.    If you cannot keep your appointment, please call [phone] to cancel or reschedule prior to your appointment date.  This allows [place] the opportunity to schedule an appointment for another patient in need of care.  If you do not cancel or reschedule by 5 p.m. the business day prior to your appointment date, you will be charged a $50.00 late cancellation/no-show fee.    Thank you for choosing  [facility] for your medical needs.  We appreciate the opportunity to care for you.  Please visit [place] at our website  to learn more about our practice.                     Sincerely,                                                             The Gastroenterology Division

## 2010-11-03 NOTE — Letter (Signed)
Summary: [facility] Instructions  [facility]  [address] [place], [place] [address]   Phone: [phone]  Fax: [phone]       [name]    September 19, 1941    MRN: [id number]        Procedure Day /Date:10/22/10 THURS     Arrival Time:730 am     Procedure Time:930 am     Location of Procedure:                     [facility] ( Outpatient Registration)                        PREPARATION FOR COLONOSCOPY WITH MOVIPREP   Starting 5 days prior to your procedure 10/17/10 do not eat nuts, seeds, popcorn, corn, beans, peas,  salads, or any raw vegetables.  Do not take any fiber supplements (e.g. Metamucil, Citrucel, and Benefiber).  THE DAY BEFORE YOUR PROCEDURE         DATE: 10/21/10  DAY: WED  1.  Drink clear liquids the entire day-NO SOLID FOOD  2.  Do not drink anything colored red or purple.  Avoid juices with pulp.  No orange juice.  3.  Drink at least 64 oz. (8 glasses) of fluid/clear liquids during the day to prevent dehydration and help the prep work efficiently.  CLEAR LIQUIDS INCLUDE: Water Jello Ice Popsicles Tea (sugar ok, no milk/cream) Powdered fruit flavored drinks Coffee (sugar ok, no milk/cream) Gatorade Juice: apple, white grape, white cranberry  Lemonade Clear bullion, consomm, broth Carbonated beverages (any kind) Strained chicken noodle soup Hard Candy                             4.  In the morning, mix first dose of MoviPrep solution:    Empty 1 Pouch A and 1 Pouch B into the disposable container    Add lukewarm drinking water to the top line of the container. Mix to dissolve    Refrigerate (mixed solution should be used within 24 hrs)  5.  Begin drinking the prep at 5:00 p.m. The MoviPrep container is divided by 4 marks.   Every 15 minutes drink the solution down to the next mark (approximately 8 oz) until the full liter is complete.   6.  Follow completed prep with 16 oz of clear liquid of your choice (Nothing red  or purple).  Continue to drink clear liquids until bedtime.  7.  Before going to bed, mix second dose of MoviPrep solution:    Empty 1 Pouch A and 1 Pouch B into the disposable container    Add lukewarm drinking water to the top line of the container. Mix to dissolve    Refrigerate  THE DAY OF YOUR PROCEDURE      DATE: 10/22/10 DAY: THURS  Beginning at 430 a.m. (5 hours before procedure):         1. Every 15 minutes, drink the solution down to the next mark (approx 8 oz) until the full liter is complete.  Nothing to eat or drink after midnight except the colon prep   MEDICATION INSTRUCTIONS  Unless otherwise instructed, you should take regular prescription medications with a small sip of water   as early as possible the morning of your procedure.           OTHER INSTRUCTIONS  You will need a responsible adult  at least 72 years of age to accompany you and drive you home.   This person must remain in the waiting room during your procedure.  Wear loose fitting clothing that is easily removed.  Leave jewelry and other valuables at home.  However, you may wish to bring a book to read or  an iPod/MP3 player to listen to music as you wait for your procedure to start.  Remove all body piercing jewelry and leave at home.  Total time from sign-in until discharge is approximately 2-3 hours.  You should go home directly after your procedure and rest.  You can resume normal activities the  day after your procedure.  The day of your procedure you should not:   Drive   Make legal decisions   Operate machinery   Drink alcohol   Return to work  You will receive specific instructions about eating, activities and medications before you leave.    The above instructions have been reviewed and explained to me by   _______________________    I fully understand and can verbalize these instructions _____________________________ Date _________

## 2010-11-05 NOTE — Progress Notes (Signed)
Summary: cx proc appt    Phone Note Call from Patient Call back at Home Phone [phone]   Caller: Patient Call For: Dr Christella Hartigan Reason for Call: Talk to Nurse Summary of Call: Patient wants to cx her appt for 1-19 that's scheduled at the hospital. Initial call taken by: Tawni Levy,  October 12, 2010 4:58 PM  Follow-up for Phone Call        pt has been sick and has asthma and wants to cx procedure and reschedule at a later date.   Endo is aware. Follow-up by: Chales Abrahams CMA Duncan Dull),  October 13, 2010 8:09 AM  Additional Follow-up for Phone Call Additional follow up Details #1::        ok Additional Follow-up by: Rachael Fee MD,  October 13, 2010 8:33 AM

## 2010-11-10 ENCOUNTER — Other Ambulatory Visit (INDEPENDENT_AMBULATORY_CARE_PROVIDER_SITE_OTHER): Payer: Medicare Other

## 2010-11-10 DIAGNOSIS — E7800 Pure hypercholesterolemia, unspecified: Secondary | ICD-10-CM

## 2010-11-10 DIAGNOSIS — I119 Hypertensive heart disease without heart failure: Secondary | ICD-10-CM

## 2010-11-10 DIAGNOSIS — R0602 Shortness of breath: Secondary | ICD-10-CM

## 2010-11-12 ENCOUNTER — Ambulatory Visit (INDEPENDENT_AMBULATORY_CARE_PROVIDER_SITE_OTHER): Payer: Medicare Other | Admitting: Cardiology

## 2010-11-12 DIAGNOSIS — E039 Hypothyroidism, unspecified: Secondary | ICD-10-CM

## 2010-11-12 DIAGNOSIS — E7800 Pure hypercholesterolemia, unspecified: Secondary | ICD-10-CM

## 2010-11-12 DIAGNOSIS — R079 Chest pain, unspecified: Secondary | ICD-10-CM

## 2010-11-12 DIAGNOSIS — Z79899 Other long term (current) drug therapy: Secondary | ICD-10-CM

## 2010-11-23 ENCOUNTER — Encounter (HOSPITAL_BASED_OUTPATIENT_CLINIC_OR_DEPARTMENT_OTHER): Payer: Medicare Other | Admitting: Oncology

## 2010-11-23 DIAGNOSIS — Z85840 Personal history of malignant neoplasm of eye: Secondary | ICD-10-CM

## 2010-11-23 DIAGNOSIS — D472 Monoclonal gammopathy: Secondary | ICD-10-CM

## 2010-11-23 DIAGNOSIS — Z85828 Personal history of other malignant neoplasm of skin: Secondary | ICD-10-CM

## 2010-11-23 DIAGNOSIS — Z85850 Personal history of malignant neoplasm of thyroid: Secondary | ICD-10-CM

## 2010-11-24 ENCOUNTER — Other Ambulatory Visit: Payer: Self-pay | Admitting: Oncology

## 2010-11-24 DIAGNOSIS — N6310 Unspecified lump in the right breast, unspecified quadrant: Secondary | ICD-10-CM

## 2010-11-27 ENCOUNTER — Ambulatory Visit
Admission: RE | Admit: 2010-11-27 | Discharge: 2010-11-27 | Disposition: A | Discharge status: Home / Self Care | Payer: Medicare Other | Source: Ambulatory Visit | Attending: Oncology | Admitting: Oncology

## 2010-11-27 DIAGNOSIS — N6310 Unspecified lump in the right breast, unspecified quadrant: Secondary | ICD-10-CM

## 2010-11-30 ENCOUNTER — Other Ambulatory Visit: Payer: Self-pay | Admitting: Oncology

## 2010-11-30 ENCOUNTER — Other Ambulatory Visit: Payer: Medicare Other

## 2010-11-30 DIAGNOSIS — N63 Unspecified lump in unspecified breast: Secondary | ICD-10-CM

## 2010-12-01 ENCOUNTER — Ambulatory Visit
Admission: RE | Admit: 2010-12-01 | Discharge: 2010-12-01 | Disposition: A | Discharge status: Home / Self Care | Payer: Medicare Other | Source: Ambulatory Visit | Attending: Oncology | Admitting: Oncology

## 2010-12-01 DIAGNOSIS — N63 Unspecified lump in unspecified breast: Secondary | ICD-10-CM

## 2010-12-03 ENCOUNTER — Other Ambulatory Visit: Payer: Self-pay | Admitting: Oncology

## 2010-12-03 ENCOUNTER — Encounter (HOSPITAL_BASED_OUTPATIENT_CLINIC_OR_DEPARTMENT_OTHER): Payer: Medicare Other | Admitting: Oncology

## 2010-12-03 DIAGNOSIS — C439 Malignant melanoma of skin, unspecified: Secondary | ICD-10-CM

## 2010-12-03 HISTORY — PX: BREAST LUMPECTOMY: SHX2

## 2010-12-03 LAB — CBC WITH DIFFERENTIAL/PLATELET
Basophils Absolute: 0 10*3/uL (ref 0.0–0.1)
Eosinophils Absolute: 0.2 10*3/uL (ref 0.0–0.5)
HGB: 13.3 g/dL (ref 11.6–15.9)
LYMPH%: 33.9 % (ref 14.0–49.7)
MCV: 88.1 fL (ref 79.5–101.0)
MONO#: 0.4 10*3/uL (ref 0.1–0.9)
MONO%: 5.7 % (ref 0.0–14.0)
NEUT#: 4.1 10*3/uL (ref 1.5–6.5)
Platelets: 271 10*3/uL (ref 145–400)
WBC: 7.1 10*3/uL (ref 3.9–10.3)

## 2010-12-04 LAB — COMPREHENSIVE METABOLIC PANEL
Albumin: 4.5 g/dL (ref 3.5–5.2)
Alkaline Phosphatase: 47 U/L (ref 39–117)
BUN: 12 mg/dL (ref 6–23)
CO2: 22 mEq/L (ref 19–32)
Glucose, Bld: 144 mg/dL — ABNORMAL HIGH (ref 70–99)
Potassium: 3.8 mEq/L (ref 3.5–5.3)
Total Protein: 7.6 g/dL (ref 6.0–8.3)

## 2010-12-04 LAB — KAPPA/LAMBDA LIGHT CHAINS
Kappa free light chain: <0.6 mg/dL (ref 0.33–1.94)
Lambda Free Lght Chn: 14.5 mg/dL — ABNORMAL HIGH (ref 0.57–2.63)

## 2010-12-04 LAB — IGG, IGA, IGM: IgM, Serum: 24 mg/dL — ABNORMAL LOW (ref 60–263)

## 2010-12-10 ENCOUNTER — Other Ambulatory Visit: Payer: Self-pay | Admitting: General Surgery

## 2010-12-10 ENCOUNTER — Ambulatory Visit (HOSPITAL_BASED_OUTPATIENT_CLINIC_OR_DEPARTMENT_OTHER)
Admission: RE | Admit: 2010-12-10 | Discharge: 2010-12-10 | Disposition: A | Discharge status: Home / Self Care | Payer: Medicare Other | Source: Ambulatory Visit | Attending: General Surgery | Admitting: General Surgery

## 2010-12-10 DIAGNOSIS — N63 Unspecified lump in unspecified breast: Secondary | ICD-10-CM | POA: Insufficient documentation

## 2010-12-10 DIAGNOSIS — I10 Essential (primary) hypertension: Secondary | ICD-10-CM | POA: Insufficient documentation

## 2010-12-10 DIAGNOSIS — Z01812 Encounter for preprocedural laboratory examination: Secondary | ICD-10-CM | POA: Insufficient documentation

## 2010-12-10 DIAGNOSIS — Z85820 Personal history of malignant melanoma of skin: Secondary | ICD-10-CM | POA: Insufficient documentation

## 2010-12-10 DIAGNOSIS — Z87898 Personal history of other specified conditions: Secondary | ICD-10-CM | POA: Insufficient documentation

## 2010-12-10 DIAGNOSIS — J45909 Unspecified asthma, uncomplicated: Secondary | ICD-10-CM | POA: Insufficient documentation

## 2010-12-10 DIAGNOSIS — Z85828 Personal history of other malignant neoplasm of skin: Secondary | ICD-10-CM | POA: Insufficient documentation

## 2010-12-10 DIAGNOSIS — Z8673 Personal history of transient ischemic attack (TIA), and cerebral infarction without residual deficits: Secondary | ICD-10-CM | POA: Insufficient documentation

## 2010-12-10 DIAGNOSIS — Z85850 Personal history of malignant neoplasm of thyroid: Secondary | ICD-10-CM | POA: Insufficient documentation

## 2010-12-10 DIAGNOSIS — Z86718 Personal history of other venous thrombosis and embolism: Secondary | ICD-10-CM | POA: Insufficient documentation

## 2010-12-12 ENCOUNTER — Inpatient Hospital Stay (HOSPITAL_COMMUNITY)
Admission: EM | Admit: 2010-12-12 | Discharge: 2010-12-16 | DRG: 863 | Disposition: A | Discharge status: Home / Self Care | Payer: Medicare Other | Attending: Surgery | Admitting: Surgery

## 2010-12-12 DIAGNOSIS — Y849 Medical procedure, unspecified as the cause of abnormal reaction of the patient, or of later complication, without mention of misadventure at the time of the procedure: Secondary | ICD-10-CM | POA: Diagnosis present

## 2010-12-12 DIAGNOSIS — L259 Unspecified contact dermatitis, unspecified cause: Secondary | ICD-10-CM | POA: Diagnosis present

## 2010-12-12 DIAGNOSIS — Z85850 Personal history of malignant neoplasm of thyroid: Secondary | ICD-10-CM

## 2010-12-12 DIAGNOSIS — I10 Essential (primary) hypertension: Secondary | ICD-10-CM | POA: Diagnosis present

## 2010-12-12 DIAGNOSIS — Z85820 Personal history of malignant melanoma of skin: Secondary | ICD-10-CM

## 2010-12-12 DIAGNOSIS — F329 Major depressive disorder, single episode, unspecified: Secondary | ICD-10-CM | POA: Diagnosis present

## 2010-12-12 DIAGNOSIS — N644 Mastodynia: Secondary | ICD-10-CM | POA: Diagnosis present

## 2010-12-12 DIAGNOSIS — Z9889 Other specified postprocedural states: Secondary | ICD-10-CM

## 2010-12-12 DIAGNOSIS — F3289 Other specified depressive episodes: Secondary | ICD-10-CM | POA: Diagnosis present

## 2010-12-12 DIAGNOSIS — N61 Mastitis without abscess: Secondary | ICD-10-CM | POA: Diagnosis present

## 2010-12-12 DIAGNOSIS — T8140XA Infection following a procedure, unspecified, initial encounter: Principal | ICD-10-CM | POA: Diagnosis present

## 2010-12-12 LAB — CBC
HCT: 36.5 % (ref 36.0–46.0)
MCH: 29.4 pg (ref 26.0–34.0)
MCHC: 33.2 g/dL (ref 30.0–36.0)
MCV: 88.6 fL (ref 78.0–100.0)
Platelets: 245 10*3/uL (ref 150–400)
RDW: 12.3 % (ref 11.5–15.5)
WBC: 8.4 10*3/uL (ref 4.0–10.5)

## 2010-12-12 LAB — BASIC METABOLIC PANEL
BUN: 17 mg/dL (ref 6–23)
Calcium: 9.5 mg/dL (ref 8.4–10.5)
Creatinine, Ser: 1.01 mg/dL (ref 0.4–1.2)
GFR calc non Af Amer: 54 mL/min — ABNORMAL LOW (ref >60)
Glucose, Bld: 113 mg/dL — ABNORMAL HIGH (ref 70–99)
Potassium: 3.5 mEq/L (ref 3.5–5.1)

## 2010-12-12 LAB — DIFFERENTIAL
Eosinophils Absolute: 0.4 10*3/uL (ref 0.0–0.7)
Eosinophils Relative: 4 % (ref 0–5)
Lymphocytes Relative: 36 % (ref 12–46)
Lymphs Abs: 3 10*3/uL (ref 0.7–4.0)
Monocytes Absolute: 0.6 10*3/uL (ref 0.1–1.0)
Monocytes Relative: 8 % (ref 3–12)

## 2010-12-12 NOTE — Op Note (Signed)
  [name], [name]                 ACCOUNT NO.:  [id number]  MEDICAL RECORD NO.:  [id number]         PATIENT TYPE:  HAMB  LOCATION:                               FACILITY:  [facility]  PHYSICIAN:  Adolph Pollack, M.D.DATE OF BIRTH:  May 16, 1941  DATE OF PROCEDURE:  12/10/2010 DATE OF DISCHARGE:                              OPERATIVE REPORT   PREOPERATIVE DIAGNOSIS:  Right breast mass.  POSTOPERATIVE DIAGNOSIS:  Right breast mass.  PROCEDURE:  Excision of right breast mass.  SURGEON:  Adolph Pollack, MD  ANESTHESIA:  MAC with Xylocaine for local.  INDICATION:  [name] is a 70 year old female who has a history of melanoma, basal cell carcinoma, and thyroid cancer.  She found 2 nodular areas in the right breast inferiorly at the 7 o'clock position.  One was about 2 cm in size, which she states had some redness around it.  She was sent for a mammogram and no dominant masses were seen, but she had a palpable mass and a little bit of a skin tag lateral to that at the 7 o'clock position.  It was somewhat bluish in discoloration.  She now presents for excision.  Procedure risks and aftercare were discussed with her preoperatively.  TECHNIQUE:  She was seen in the holding area and the mass marked and my initials placed on the right breast.  She was then brought to the operating room, placed supine on the operating table and given intravenous sedation.  The right breast was sterilely prepped and draped.  Local anesthetic consisting of 1% Xylocaine with epinephrine was infiltrated around the 7 o'clock position and a transverse incision was made around the 7 o'clock position of the left breast superior to the inframammary fold.  I could palpate a firm mass.  I subsequently excised the mass and surrounding normal tissue sharply and sent the mass to Pathology.  Bleeding was controlled with electrocautery.  Once hemostasis was adequate, I then reapproximated the subcutaneous tissue  with interrupted 3-0 Vicryl sutures.  The skin was closed with a 4-0 Monocryl subcuticular stitch.  Steri-Strips and sterile dressings were applied.  She tolerated the procedure well without any apparent complications and was taken to recovery room in satisfactory condition.     Adolph Pollack, M.D.     Kari Baars  D:  12/10/2010  T:  12/10/2010  Job:  [id number]  cc:   Valentino Hue. Magrinat, M.D. Fax: [phone]  Adolph Pollack, M.D. [address] [place] [address]  Stann Mainland. Vincente Poli, M.D. Fax: [phone]  Electronically Signed by Avel Peace M.D. on 12/12/2010 09:36:41 PM

## 2010-12-14 NOTE — H&P (Signed)
  [name], [name]                 ACCOUNT NO.:  [id number]  MEDICAL RECORD NO.:  [id number]           PATIENT TYPE:  I  LOCATION:  1540                         FACILITY:  [facility]  PHYSICIAN:  Ollen Gross. Vernell Morgans, M.D. DATE OF BIRTH:  1941/07/17  DATE OF ADMISSION:  12/12/2010 DATE OF DISCHARGE:  12/13/2010                             HISTORY & PHYSICAL   HISTORY OF PRESENT ILLNESS:  [name] is a 69 year old white female who is about 2 days out or so from right breast lumpectomy.  She comes into the ER tonight with complaints of redness and painful right breast.  She has not had any drainage.  She is not running any fevers.  She otherwise denies any nausea, vomiting, fevers, chills, chest pain, shortness breath, diarrhea, dysuria.  REVIEW OF SYSTEMS:  Rest of the review of systems are unremarkable.  PAST MEDICAL HISTORY:  Significant for: 1. Melanoma. 2. Thyroid cancer. 3. Hypertension. 4. Depression. 5. High cholesterol.  PAST SURGICAL HISTORY:  Significant for enucleation of right eye, thyroidectomy and recent right breast lumpectomy.  MEDICATIONS:  Micardis, amlodipine, aspirin, alprazolam, Xanax, citalopram, Tylenol, simvastatin, Synthroid, hydrochlorothiazide, fish oil, multivitamins, Advair Diskus, albuterol and Allegra.  ALLERGIES:  CODEINE and HYDROCODONE.  SOCIAL HISTORY:  She denies use of tobacco or alcohol products.  FAMILY HISTORY:  Noncontributory.  PHYSICAL EXAMINATION:  VITAL SIGNS:  Temperature is 97.5, blood pressure 119/81, pulse of 82. GENERAL:  She is a well-developed, well-nourished elderly white female, in no acute distress. SKIN:  Warm and dry.  No jaundice. EYES:  She has enucleated right eye. LUNGS:  Clear bilaterally with no use of accessory respiratory muscles. HEART:  Regular rate and rhythm with impulse in left chest. ABDOMEN:  Soft and nontender with no palpable mass or hepatosplenomegaly. EXTREMITIES:  No cyanosis, clubbing or edema.   Good strength in arms and legs. PSYCHOLOGICALLY:  She is alert and oriented x3 with no evidence at this time of anxiety or depression. BREASTS:  On evaluation of her right breast, her incision is intact. She has some redness of the right breast around the incision.  No obvious palpable fluid but just some cellulitis.  She also has a rash of the skin that looks more like a contact dermatitis.  LABORATORY DATA:  On review of her lab work, her white count is normal.  ASSESSMENT/PLAN:  This is a 70 year old white female with recent right breast lumpectomy who has some cellulitis of right breast and what left looks like possibly contact dermatitis as well.  We will plan to admit her for IV antibiotics and monitoring and reeval her and follow her incision and redness.     Ollen Gross. Vernell Morgans, M.D.     PST/MEDQ  D:  12/12/2010  T:  12/13/2010  Job:  [id number]  Electronically Signed by Chevis Pretty III M.D. on 12/14/2010 07:05:37 AM

## 2010-12-15 DIAGNOSIS — C439 Malignant melanoma of skin, unspecified: Secondary | ICD-10-CM

## 2010-12-16 LAB — GLUCOSE, CAPILLARY: Glucose-Capillary: 108 mg/dL — ABNORMAL HIGH (ref 70–99)

## 2010-12-18 NOTE — Discharge Summary (Signed)
  NAMEIVANNIA, WILLHELM                 ACCOUNT NO.:  [id number]  MEDICAL RECORD NO.:  [id number]           PATIENT TYPE:  LOCATION:                                 FACILITY:  PHYSICIAN:  Meagan Ancona A. Trejuan Matherne, M.D.DATE OF BIRTH:  March 13, 1941  DATE OF ADMISSION:  12/12/2010 DATE OF DISCHARGE:  12/16/2010                              DISCHARGE SUMMARY   ADMITTING PHYSICIAN:  Ollen Gross. Vernell Morgans, M.D.  DISCHARGING PHYSICIAN:  Clovis Pu. Remedy Corporan, M.D.  PRIMARY SURGEON:  Adolph Pollack, M.D.  CONSULTANTS:  None.  REASON FOR ADMISSION:  Ms. Blanchard is a 70 year old female who is 2 days from a right breast lumpectomy.  She presented to the ER on the night of admission with complaints of redness and painful mass at her right breast.  She denies any drainage or fever.  Upon evaluation, her incision was intact.  She did have some redness of the right breast around the incision, but no obvious palpable fluid, just cellulitis. She also had a rash of the skin, it looked more like a contact type dermatitis.  Please see admitting history and physical for further details.  ADMITTING DIAGNOSES:  Recent right breast lumpectomy with cellulitis of the right breast and possible contact dermatitis.  HOSPITAL COURSE:  At this time, the patient was admitted and placed on IV vancomycin.  The following day, her redness persisted and the patient felt like the clindamycin she got in the ER was better.  She was eventually switched back to IV clindamycin.  This was continued for several days.  Eventually nystatin powder was also added as it did appear that she may have a yeast component to this as well.  After the nystatin powder was added, this dramatically decreased her itching and burning that she did have.  By hospital day #4, the patient's redness had significantly decreased and it almost completely gone away specifically more around the areola and the nipple and the breast itself.  However, there was still  some dry scaly appearance with erythema under the breasts consistent with a dermatitis.  However, this had also improved.  At this time, though, the patient was felt improved and stable to go home.  DISCHARGE DIAGNOSES: 1. Status post recent right breast lumpectomy. 2. Cellulitis and contact dermatitis, improved.  DISCHARGE MEDICATIONS:  Please see medication reconciliation form.  DISCHARGE INSTRUCTIONS:  The patient may resume all instructions that were given to her status post recent lumpectomy.  She has made an appointment to see Dr. Abbey Chatters this coming Monday for followup.  The patient will be given a 7-day course of clindamycin as well as more nystatin powder to use at home.     Letha Cape, PA   ______________________________ Clovis Pu Kemond Amorin, M.D.    KEO/MEDQ  D:  12/16/2010  T:  12/16/2010  Job:  [id number]  cc:   Valentino Hue. Magrinat, M.D. Fax: [phone]  Adolph Pollack, M.D. [address], [address] [place] [address]  Electronically Signed by Barnetta Chapel PA on 12/16/2010 01:40:26 PM Electronically Signed by Harriette Bouillon M.D. on 12/18/2010 09:20:27 AM

## 2010-12-19 LAB — CULTURE, BLOOD (ROUTINE X 2)
Culture  Setup Time: 201203111137
Culture: NO GROWTH
Culture: NO GROWTH

## 2010-12-23 LAB — GLUCOSE, CAPILLARY
Glucose-Capillary: 108 mg/dL — ABNORMAL HIGH (ref 70–99)
Glucose-Capillary: 94 mg/dL (ref 70–99)

## 2011-01-04 ENCOUNTER — Encounter (HOSPITAL_BASED_OUTPATIENT_CLINIC_OR_DEPARTMENT_OTHER): Payer: Medicare Other | Admitting: Oncology

## 2011-01-04 DIAGNOSIS — R29898 Other symptoms and signs involving the musculoskeletal system: Secondary | ICD-10-CM

## 2011-01-04 DIAGNOSIS — Z85840 Personal history of malignant neoplasm of eye: Secondary | ICD-10-CM

## 2011-01-04 DIAGNOSIS — D472 Monoclonal gammopathy: Secondary | ICD-10-CM

## 2011-01-04 DIAGNOSIS — Z85850 Personal history of malignant neoplasm of thyroid: Secondary | ICD-10-CM

## 2011-01-05 ENCOUNTER — Other Ambulatory Visit: Payer: Self-pay | Admitting: *Deleted

## 2011-01-05 DIAGNOSIS — E7800 Pure hypercholesterolemia, unspecified: Secondary | ICD-10-CM

## 2011-01-05 MED ORDER — SIMVASTATIN 20 MG PO TABS: 20.0000 mg | ORAL_TABLET | Freq: Every evening | ORAL | Status: DC

## 2011-01-05 NOTE — Telephone Encounter (Signed)
Patient requested refill

## 2011-01-10 LAB — GLUCOSE, CAPILLARY: Glucose-Capillary: 103 mg/dL — ABNORMAL HIGH (ref 70–99)

## 2011-02-10 ENCOUNTER — Encounter (INDEPENDENT_AMBULATORY_CARE_PROVIDER_SITE_OTHER): Payer: Self-pay | Admitting: General Surgery

## 2011-02-16 NOTE — Assessment & Plan Note (Signed)
[facility]                            CARDIOLOGY OFFICE NOTE   TIFFENY, MINCHEW                        MRN:          [id number]  DATE:11/29/2007                            DOB:          October 13, 1940    PRIMARY CARE PHYSICIAN:  Pam Drown, M.D.   GYNECOLOGIST:  Stann Mainland. Vincente Poli, M.D.   INTERVAL HISTORY:  Ms. [name] is a 70 year old woman with a history of  malignant melanoma and thyroid cancer.  She also has a history of  hypertension, hyperlipidemia and possible mitral valve prolapse with  trivial mitral regurgitation.  She has been followed by Dr. Daleen Squibb and Dr.  Patty Sermons in the past however came to see me in August for problems with  her blood pressure and her mitral valve prolapse. We went ahead and  repeated her echocardiogram which showed an EF of 55-60%.  There was  trivial mitral regurgitation but no significant prolapse.  She also had  a Myoview which showed an EF of 70% with no ischemia.   She returns today with multiple complaints, primarily she said that her  blood pressure has been running quite high and is up to 170 but usually  in the 130-140. She called our office and was frustrated that she could  not get in  so she went to see back to see Dr. Patty Sermons who changed her  medication.  She also saw Dr. Rosanne Ashing Deterding in the renal clinic who  apparently found some protein in her urine and is sending her to the  oncology clinic for possible marrow biopsy. I suspect he is querying  myeloma, but this is not clear.  She is very anxious. She says that she  thinks the metoprolol is giving her a severe headache.  She is also  complaining of problems with her leg where she had her varicose vein  ablated.  She denies any chest pain or shortness of breath.   CURRENT MEDICATIONS:  1. HCTZ 25 a day.  2. Lexapro 10 a day.  3. Allegra 60 a day.  4. Advair.  5. Zocor 40 a day.  6. Synthroid 112 mcg a day.  7. Prednisone eyedrops.  8.  Aspirin 325 a day.  9. Amlodipine 10 a day.  10.Metoprolol 25 a day.   PHYSICAL EXAM:  She is quite anxious.  She is otherwise in no acute  distress, ambulates around the clinic without respiratory difficulty.  Blood pressure is 132/76, weights 158, heart rate 73.  HEENT:  Normal.  NECK:  Supple.  There is no JVD.  Carotid 2+ bilaterally without any  bruits.  There is no lymphadenopathy or thyromegaly.  CARDIAC:  She is regular rate and rhythm. No murmurs, rubs or gallops.  LUNGS:  Clear.  ABDOMEN:  Soft, nontender, nondistended, no hepatosplenomegaly, no  bruits, no masses.  Good bowel sounds.  EXTREMITIES:  Warm with no cyanosis, clubbing or edema.  No rash.  NEURO:  Alert and oriented x3.  Her speech is pressured. Cranial nerves  II-XII intact.  Moves all four shows without difficulty.   EKG shows  normal sinus rhythm with just mild nonspecific ST-T wave  abnormalities.   ASSESSMENT/PLAN:  Hypertension.  Her blood pressure is only minimally  elevated. I told her that I think it is a disservice to her for too many  doctors to be trying to manage her blood pressure.  I felt that it was  most appropriate that since Dr. Darrick Penna was involved with her care  that it might be best to have him manage her blood pressure or, if she  preferredm Dr. Patty Sermons. I have reassured her that her stress test and  echocardiogram are completely normal. Given her headaches, I told her it  would be reasonable to stop her metoprolol for a couple days to see if  this was the inciting agent, but  I thought that was unlikely.  She will  follow up with [place] on p.r.n. basis.     Bevelyn Buckles. Bensimhon, MD  Electronically Signed    DRB/MedQ  DD: 11/29/2007  DT: 11/30/2007  Job #: [id number]   cc:   Marcelino Duster L. Vincente Poli, M.D.  Pam Drown, M.D.

## 2011-02-16 NOTE — Assessment & Plan Note (Signed)
OFFICE VISIT   AMRAN, MALTER  DOB:  December 27, 1940                                       10/16/2007  LKGMW#:[id number]   The patient underwent laser ablation of the left greater saphenous vein  with between 10 and 20 stab phlebectomies performed on January 5 for  severe venous insufficiency of the left leg and painful varicosities.  She has done well since that procedure with some mild aching discomfort  along the course of the greater saphenous vein in the thigh, as one  would expect.  She has had minimal edema distally and has been wearing  her elastic compression stocking, and taking the ibuprofen as  prescribed.   EXAM:  Her thigh has a few mild ecchymotic areas where stab  phlebectomies were performed, and the calf phlebectomies are healing  nicely.  There is no distal edema.  There is mild tenderness along the  course of the greater saphenous vein.  I performed a venous duplex exam,  and the saphenofemoral junction is patent for about 2 cm with the  saphenous vein being occluded from that point to the knee with no  obstruction or any evidence of thrombus formation in the deep venous  system.  She was reassured regarding these findings.  Will continue to  wear elastic compression stockings for 2 to 3 weeks, and return in 6  months for a final venous duplex followup in the vascular lab.   Quita Skye Hart Rochester, M.D.  Electronically Signed   JDL/MEDQ  D:  10/16/2007  T:  10/17/2007  Job:  [id number]   cc:   Dr. Sigmund Hazel

## 2011-02-16 NOTE — Assessment & Plan Note (Signed)
OFFICE VISIT   AKYA, FIORELLO  DOB:  1941-01-04                                       12/26/2007  ZOXWR#:[id number]   Patient continued to have some discomfort in the left medial calf where  stab phlebectomies were performed when I did her procedure on 10/09/07.  She had been seen by Dr. Arbie Cookey in late January and was to follow up with  me in six months.  She made an appointment to come in today because the  pain had persisted, but she states that over the last 5-6 days, the pain  has now resolved.  She states that the left calf feels great with no  burning on this or discomfort.  She is having no left distal edema and  no throbbing in the left thigh.  We fitted her for another pair of long  leg elastic compression stockings because of some early symptomatology  in her right leg, and she will return as scheduled in July with a follow-  up  venous duplex exam for final followup.   Quita Skye Hart Rochester, M.D.  Electronically Signed   JDL/MEDQ  D:  12/26/2007  T:  12/27/2007  Job:  [id number]

## 2011-02-16 NOTE — Assessment & Plan Note (Signed)
OFFICE VISIT   [name], [name]  DOB:  1940-11-14                                       10/28/2009  GEXBM#:[id number]   The patient is a 70 year old female patient who has previously underwent  laser ablation of her left great saphenous vein with multiple stab  phlebectomies by me in January of 2009 with a good result.  One month  ago some boxes fell on her left ankle and foot while shopping at [employer] and she developed some pain and swelling in the ankle.  She has  been seen since then by Dr. Aldean Baker who has treated her with a boot.  X-rays apparently revealed some strains in the ligaments but no bony  fractures according to the patient.  She has had no severe thigh or calf  swelling but has had ankle and swelling on the lateral aspect of the  foot intermittently.  She has had no thrombophlebitis, deep vein  thrombosis, ulceration, bleeding or other complications.   REVIEW OF SYSTEMS:  The patient denies any chest pain, dyspnea on  exertion, PND, orthopnea.  No wheezing.  Denies any neurologic symptoms.   FAMILY HISTORY:  Positive for varicose veins.   SOCIAL HISTORY:  Does not use tobacco or alcohol.   PHYSICAL EXAMINATION:  Vital signs:  Blood pressure is 104/70, heart  rate 77, respirations 14, temperature 97.6.  General:  She is a well-  developed, well-nourished female in no apparent distress.  HEENT:  Exam  is unremarkable.  EOMs intact.  Chest:  Clear to auscultation.  Cardiovascular:  Regular rhythm.  No murmurs.  Abdomen:  Soft, nontender  with no masses.  Musculoskeletal:  Exam reveals no major deformities.  Examination of the left lower extremity reveals 3+ femoral, popliteal,  dorsalis pedis pulse palpable.  There is no calf or thigh tenderness and  no significant swelling.  There is some mild swelling in the lateral  aspect of the left ankle with no tenderness.  She has no noted  varicosities or evidence of superficial  thrombophlebitis.   I ordered a venous duplex exam today which I have reviewed and  interpreted.  She has no evidence of deep venous obstruction and does  have total closure of her left great saphenous vein.   I reassured her regarding these findings.  She has no evidence of any  venous complications or problems and will continue to be followed by Dr.  Lajoyce Corners for this injury.     Quita Skye Hart Rochester, M.D.  Electronically Signed   JDL/MEDQ  D:  10/28/2009  T:  10/29/2009  Job:  [id number]   cc:   Nadara Mustard, MD

## 2011-02-16 NOTE — Procedures (Signed)
LOWER EXTREMITY VENOUS REFLUX EXAM   INDICATION:  History of left superficial venous thrombophlebitis on the  left lateral side.  Complains of symptoms left lower extremity varicose  veins.   EXAM:  Using color-flow imaging and pulse Doppler spectral analysis, the  left common femoral, superficial femoral, popliteal, posterior tibial,  greater and lesser saphenous veins are evaluated.  There is no evidence  suggesting deep venous insufficiency in the left lower extremity, with  the exception of the common femoral vein.   The left saphenofemoral junction is not competent.  The left GSV Is not  competent with the caliber as described below.   The left proximal short saphenous vein demonstrates competency.   GSV Diameter (used if found to be incompetent only)                                            Right    Left  Proximal Greater Saphenous Vein           cm       0.85 cm  Proximal-to-mid-thigh                     cm       0.56 cm  Mid thigh                                 cm       0.53 cm  Mid-distal thigh                          cm       0.75 cm  Distal thigh                              cm       0.52 cm  Knee                                      cm       0.89 cm   IMPRESSION:  1. Left greater saphenous vein reflux is identified with the caliber      ranging from 0.52 cm to 0.89 cm knee to groin.  2. The left greater saphenous vein is not aneurysmal.  3. The left greater saphenous vein is not tortuous.  4. There is no evidence of significant deep venous insufficiency in      the left lower extremity.  5. The deep venous system is competent with the exception of focal      reflux in the common femoral vein.  6. The left lesser saphenous vein is competent.   ___________________________________________  Quita Skye. Hart Rochester, M.D.   AR/MEDQ  D:  06/14/2007  T:  06/15/2007  Job:  [id number]

## 2011-02-16 NOTE — Assessment & Plan Note (Signed)
OFFICE VISIT   MAISYN, NOURI  DOB:  09-16-1941                                       09/12/2007  EAVWU#:[id number]   The patient returns today for further evaluation of her venous  insufficiency of the left lower extremity.  She has symptomatic  varicosity of her left greater saphenous system, which is incompetent  from the saphenofemoral junction to the knee.  She continues to have  throbbing, aching, and burning discomfort in the thigh and the calf,  despite wearing long-leg elastic compression stockings on a daily basis  for the past 3 months.  She does have a history of localized  thrombophlebitis of the left leg involving 1 varix, which does not  involve the greater saphenous vein itself.  She has had no distal edema.  She does get some improvement with elevation of the legs, and some  improvement while wearing the stockings, but it is effecting her daily  living while she is ambulatory.  She has been taking ibuprofen with no  improvement of the discomfort.  She has no symptoms in the contralateral  right leg.   EXAM:  She has no distal edema, but does have chronic varicosities in  the distal thigh and calf on the left side.  She has excellent arterial  pulses distally.   I think she is an excellent candidate for laser ablation of the left  greater saphenous vein with multiple stab phlebectomies, and we will  proceed with precertification for this nice lady who is having  significant symptoms.   Quita Skye Hart Rochester, M.D.  Electronically Signed   JDL/MEDQ  D:  09/12/2007  T:  09/13/2007  Job:  [id number]

## 2011-02-16 NOTE — Procedures (Signed)
DUPLEX DEEP VENOUS EXAM - LOWER EXTREMITY   INDICATION:  Pain.   HISTORY:  Edema:  Yes.  Trauma/Surgery:  Yes.  Pain:  Yes.  PE:  No.  Previous DVT:  No.  Anticoagulants:  No.  Other:  No.   DUPLEX EXAM:                CFV   SFV   PopV  PTV    GSV                R  L  R  L  R  L  R   L  R  L  Thrombosis    o  o     o     o      o     +  Spontaneous   +  +     +     +      +     o  Phasic        +  +     +     +      +     o  Augmentation  +  +     +     +      +     o  Compressible  +  +     +     +      +     o  Competent     +  +     +     +      +     o   Legend:  + - yes  o - no  p - partial  D - decreased   IMPRESSION:  There does not appear to be any deep venous thrombus noted  in the left leg.  However, the left great saphenous vein appears with  echogenic material proximal to distal thigh.    _____________________________  Quita Skye. Hart Rochester, M.D.   CB/MEDQ  D:  10/28/2009  T:  10/29/2009  Job:  [id number]

## 2011-02-16 NOTE — Assessment & Plan Note (Signed)
OFFICE VISIT   [name], [name]  DOB:  22-May-1941                                       11/03/2007  UEAVW#:[id number]   The patient presents today for continued concern regarding pain in her  left leg.  I had seen her on 01/09, Dr. Hart Rochester had seen her on 01/12.  She is status post laser ablation of her left greater saphenous vein and  multiple stab phlebectomies on January 5.  She continues to have some  soreness over the medial calf and also down into her foot.  This does  sound to be irritation of the cutaneous nerves.  She did not have any  diminished function and was leaving town this weekend, and wanted to be  checked before she left town.  She reports this is eased with elevation  and rest and is worse with prolonged walking.  I did do a hand-held  duplex and her popliteal vein is widely patent.  She does have patent  saphenous vein from the ankle to the mid calf.  She does not have any  evidence of superficial thrombophlebitis.  I had a long discussion with  the patient and explained that this is not aneurysm.  She does not need  to limit her activities.  I explained that she also does not need to  have a specific exercise program currently, until this resolves, and  that this is a time-limited issue that will continue to improve.  She  was relieved with this discussion.  Will see Dr. Hart Rochester in 6 months as  planned.   Larina Earthly, M.D.  Electronically Signed   TFE/MEDQ  D:  11/03/2007  T:  11/06/2007  Job:  [id number]   cc:   Quita Skye. Hart Rochester, M.D.

## 2011-02-16 NOTE — Assessment & Plan Note (Signed)
OFFICE VISIT   [name], [name]  DOB:  Feb 12, 1941                                       10/13/2007  JYNWG#:[id number]   [name] presents today for continued followup of her venous ablation  by Dr. Hart Rochester on January 5.  She called complaining of severe pain, and  was instructed to come in for evaluation.  Her leg looks quite good with  the usual amount of mild bruising, and no evidence of infection or  erythema.  She reports that she had had pain in the thigh.  I imaged  this area and she does have closure of saphenous vein and her common  femoral vein is widely patent with the saphenous vein occluded  approximately 1 cm to 2 cm from the saphenofemoral junction.  I  explained this would be exactly what we would expect to see, and  reassured her that this the usual postoperative course.  She will  continue her ibuprofen, and also, I have written her for Tylox 1-2 q.4  hours, #20.  She will keep her appointment with Dr. Hart Rochester on January  12th as scheduled.   Larina Earthly, M.D.  Electronically Signed   TFE/MEDQ  D:  10/13/2007  T:  10/16/2007  Job:  [id number]

## 2011-02-16 NOTE — Consult Note (Signed)
VASCULAR SURGERY CONSULTATION   [name], [name]  DOB:  May 06, 1941                                       06/13/2007  FAOZH#:[id number]   The patient is a 70 year old female patient with a history of varicose  veins which has become increasingly symptomatic in the left leg.  She  describes increasing pain, aching, burning and cramping in the left  lower thigh and calf which is increased over the last several months.  She was evaluated at the [facility] where a laser ablation was  recommended.  She also has a history of thrombophlebitis in the left  thigh and fell and injured the posterior aspect of her left proximal  thigh, developed a hematoma in that area which has now resolved.  She  has worn elastic compression stockings since January 2008 (knee high).  She has elevated her legs on occasion which does help the discomfort,  but is not taking pain medicine on a regular basis.  She has no history  of bleeding, ulceration or deep venous thrombosis.   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  She is followed in the [facility] in  [place].  She has a history of hypertension and mitral valve  prolapse but no diabetes, hyperlipidemia, coronary artery disease, COPD  or stroke.   PAST SURGICAL HISTORY:  Includes left thigh surgery and thyroid surgery.   FAMILY HISTORY:  Positive for varicose veins.   SOCIAL HISTORY:  She does not currently use tobacco or alcohol.   ALLERGIES:  NONE KNOWN.   PHYSICAL EXAMINATION:  VITAL SIGNS:  Blood pressure 110/70, heart rate  88 and regular, respirations 18.  GENERAL:  She is a female patient who is in no apparent distress.  She  is alert and oriented times three.  NECK:  Supple. 3+ carotid pulses palpable.  No  bruits are audible.  NEUROLOGIC EXAM:  Normal.  No palpable adenopathy in the neck.  CHEST:  Clear to auscultation.  ABDOMEN:  Soft, nontender with no palpable masses.  EXTREMITIES:  Upper extremity pulses are 3+ bilaterally.  She has 3+  femoral, popliteal and dorsalis pedis pulses palpable bilaterally.  Left  leg has varicosities in the distal medial thigh and the proximal medial  calf over the greater saphenous system.  She has mild distal edema with  no hyperpigmentation or ulceration noted.  The right leg has no evidence  of significant venous insufficiency.   Venous ultrasound and duplex scans were performed in our office today  which revealed gross incompetence of the left saphenofemoral junction  and the left greater saphenous vein with a patent greater saphenous vein  from the saphenofemoral junction to the knee.  The small saphenous vein  is competent.  There is no evidence of deep venous obstruction.   I think she is an excellent candidate for laser ablation of her left  greater saphenous vein with multiple stab phlebectomies for  varicosities.  We will fit her for long leg elastic compression  stockings today.  Continue her conservative treatment.  She will also  try elevation and analgesics and return to see me in  three months to see  if she has had any progress, at which time we will make a disposition.   Quita Skye Hart Rochester, M.D.  Electronically Signed  JDL/MEDQ  D:  06/13/2007  T:  06/14/2007  Job:  [id number]   cc:   Cassell Clement, M.D.  Al Decant. Janey Greaser, MD

## 2011-02-16 NOTE — Procedures (Signed)
DUPLEX DEEP VENOUS EXAM - LOWER EXTREMITY   INDICATION:  Follow-up evaluation of left GSV, status post ablation.   HISTORY:  Edema:  Transient.  Trauma/Surgery:  Endovascular laser ablation of left GSV on 10/09/07.  Pain:  Around scar.  PE:  No.  Previous DVT:  History of SVT in left calf.  Anticoagulants:  325 mg aspirin daily.  Other:   DUPLEX EXAM:                CFV   SFV   PopV  PTV    GSV                R  L  R  L  R  L  R   L  R  L  Thrombosis    o  o     o     o      o     +  Spontaneous   +  +     +     +      +     0  Phasic        +  +     +     +      +     0  Augmentation  +  +     +     +      +     0  Compressible  +  +     +     +      +     P  Competent     +  +     +     +      +     +   Legend:  + - yes  o - no  p - partial  D - decreased   IMPRESSION:  1. No evidence of deep venous thrombosis or superficial venous      thrombosis in left lower extremity.  2. Greater saphenous vein appears occluded, status post ablation, just      distal to the junction.  3. Pulsatile flow noted in right common femoral vein.    _____________________________  Quita Skye. Hart Rochester, M.D.   PB/MEDQ  D:  04/23/2008  T:  04/23/2008  Job:  [id number]

## 2011-02-16 NOTE — Assessment & Plan Note (Signed)
OFFICE VISIT   EMILEIGH, KELLETT  DOB:  July 17, 1941                                       04/23/2008  BJYNW#:[id number]   The patient returns now 6 months post laser ablation of her left great  saphenous vein with multiple stab phlebectomies performed on 10/09/2007.  She states that at the present time she is having no discomfort in her  left leg, either in the thigh or calf, and denies any edema in the left  leg.  She does wear elastic compression stockings from time to time, but  states that essentially the leg is asymptomatic.  She is having some  early leg discomfort in the right leg associated with some reticular and  spider veins in the calf and thigh area.  She has had no chest pain,  dyspnea on exertion, or neurologic symptoms.   PHYSICAL EXAMINATION:  Blood pressure 135/83, heart rate 74,  respirations 14.  Carotid pulses 3+ with no audible bruits.  Chest:  Clear to auscultation.  Cardiovascular:  Regular rhythm no murmurs.  Lower extremity exam reveals 3+ femoral, popliteal, and dorsalis pedis  pulses bilaterally.  Left leg has no distal edema and stab phlebectomy  sites have all healed nicely.  There is no tenderness in the left thigh  or calf and no hyperpigmentation or ulceration noted.  Venous duplex  exam reveals total closure of the left great saphenous vein from the  saphenofemoral junction distally with a widely patent deep system.  I  have reassured her regarding these findings.  She will return to see [place]  on a p.r.n. basis.   Quita Skye Hart Rochester, M.D.  Electronically Signed   JDL/MEDQ  D:  04/23/2008  T:  04/24/2008  Job:  [id number]

## 2011-02-16 NOTE — Assessment & Plan Note (Signed)
[facility]                            CARDIOLOGY OFFICE NOTE   NAME:DAVISKaslyn, [name]                        MRN:          [id number]  DATE:05/09/2007                            DOB:          1941/09/23    REFERRING PHYSICIAN:  Marcelino Duster L. Vincente Poli, M.D.   PRIMARY CARE PHYSICIAN:  Pam Drown, M.D.   REASON FOR CONSULTATION:  Lower extremity blood clots and mitral valve  prolapse.   HISTORY OF PRESENT ILLNESS:  [name] is a very pleasant 70 year old  woman with a history of a malignant melanoma and thyroid cancer.  She  also has a history of mitral valve prolapse with trivial mitral  regurgitation, hyperlipidemia and hypertension.  She has seen Dr. Maisie Fus  C. Wall in our clinic in the remote past.  She more recently has been  followed by Dr. Cassell Clement at [facility], and she now  comes to establish long-term care.   She tells me that she has always had problems with varicose veins and  recently she went to Dr. Consuello Closs and was found to have some  superficial clots in her left leg.  There do not appear to be any deep  venous thromboses.  She usually wears compression hose, but she recently  had a fall and injured her left thigh and has not been wearing them  since.  Typically she walks about two miles a day, without any  difficulty, but she has not been doing this recently due to her fall.  She does say that when she does her housework, she sometimes notices  shortness of breath with doing housework but not with her walking.  She  denies any chest pain.  No significant lower extremity edema.  No  orthopnea, no PND.  Very occasional palpitations but nothing prolonged.   REVIEW OF SYSTEMS:  This is diffusely positive, including  gastroesophageal reflux disease, hematuria, arthritis, thyroid disease,  depression, hiatal hernia, fatigue and allergies.  She denies any chest  pain.  No bright red blood per rectum, no  melena.   PAST MEDICAL HISTORY:  1. Mitral valve prolapse with trivial mitral regurgitation.  2. Hypertension.  3. Hyperlipidemia.  4. 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  5. History of papillary thyroid carcinoma, status post resection and      radiation.  6. Depression.  7. Osteoporosis.  8. Gastroesophageal reflux disease.   CURRENT MEDICATIONS:  1. Hydrochlorothiazide 25 mg daily.  2. Lexapro 10 mg daily.  3. Allegra 60 mg daily.  4. Advair 50/100 mg daily.  5. Simvastatin 40 mg daily.  6. Aspirin 81 mg daily.  7. Synthroid 112 mcg daily.  8. Flurazepam 15 mg q.h.s.  9. Multivitamin.   ALLERGIES:  ZOLOFT, CODEINE, APPARENTLY NARCOTICA, LATEX AND ORAL  PREDNISONE.   SOCIAL HISTORY:  She is single with two children.  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  She denies any tobacco or alcohol use.   FAMILY HISTORY:  Mother died at  age 11, due to a stroke.  Father died at  age 51, due to congestive heart failure.   PHYSICAL EXAMINATION:  GENERAL:  She is well-appearing, in no acute  distress.  She ambulates around the clinic without any respiratory  difficulty.  VITAL SIGNS:  Blood pressure 112/68, heart rate 75.  HEENT:  Normal except for a previous surgery on her right eye.  NECK:  Supple, no jugular venous distention.  Carotids are 2+  bilaterally without bruits.  There is no lymphadenopathy or thyromegaly.  HEART:  PMI is not displaced.  She has a regular rate and rhythm with no  murmurs, rubs or gallops.  I do not hear a click.  LUNGS:  Clear.  ABDOMEN:  Soft, nontender, non-distended.  No hepatosplenomegaly.  No  bruits, no masses.  Good bowel sounds.  EXTREMITIES:  Warm, no clubbing, cyanosis or edema.  She does have  prominent varicose veins, left greater than right, and a fairly  significant hematoma on the back of her left thigh.  NEUROLOGIC:  She is alert and oriented x3.  Cranial nerves  II-XII  are  intact.  She moves all four extremities without difficulty.  Affect is  pleasant.   Electrocardiogram shows a sinus rhythm at a rate of 75.  No significant  ST-T wave abnormalities.   ASSESSMENT/PLAN:  1. Superficial clots:  I suspect this is just related to her venous      insufficiency.  There is no evidence of deep venous thrombosis.  At      this point I have recommended compression hose, and she can      increase her aspirin to 325 mg daily.  I have asked her to follow      up with Dr. Donia Ast.  2. Mitral valve prolapse:  Previously she did not have significant      regurgitation.  She is due for a routine echocardiogram.  3. Exertional dyspnea:  This is a bit hit and miss.  Given her risk      factors, I do think it is reasonable to check a treadmill Myoview.  4. Hyperlipidemia:  Most recent LDL was at goal at 80.  Will recheck      her fasting lipids and keep her on her simvastatin.   DISPOSITION:  I have reassured her and will see her back in four months  for routine followup.     Bevelyn Buckles. Bensimhon, MD  Electronically Signed    DRB/MedQ  DD: 05/09/2007  DT: 05/09/2007  Job #: [id number]   cc:   Consuello Closs, M.D.  Pam Drown, M.D.  Michelle L. Vincente Poli, M.D.

## 2011-02-19 NOTE — Op Note (Signed)
[name], [name]                 ACCOUNT NO.:  [id number]   MEDICAL RECORD NO.:  [id number]          PATIENT TYPE:  AMB   LOCATION:  DAY                          FACILITY:  [facility]   PHYSICIAN:  Adolph Pollack, M.D.DATE OF BIRTH:  02/06/1941   DATE OF PROCEDURE:  11/09/2006  DATE OF DISCHARGE:                               OPERATIVE REPORT   PREOPERATIVE DIAGNOSIS:  Left axillary mass.   POSTOPERATIVE DIAGNOSIS:  Left axillary mass.   PROCEDURE:  Excision of 3 cm left axillary mass.   SURGEON:  Adolph Pollack, M.D.   ANESTHESIA:  General plus 0.5% Marcaine local.   INDICATION:  Ms. Snare is a 70 year old female with a history of a chest  wall melanoma.  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  She has had a  persistent mass that has been noted by Dr. Vincente Poli in the left axilla and  she has been on 2 rounds of antibiotics without resolution.  Because of  her history of melanoma in the mid-chest wall, I felt it would be  prudent to excise the mass and she presents for that.  We discussed the  procedure risks and aftercare preoperatively.   TECHNIQUE:  She is seen in the holding area and the axillary area marked  with my initials on the left side.  She is then brought to the operating  room and placed supine on the operating room and a general anesthetic  was administered.  The left axillary area was sterilely prepped and  draped.  A curvilinear incision was made directly over the mass in the  anterior axillary area and subcutaneous tissue was divided.  I then  noticed what appeared to be a firm lipomatous-type mass emanating from  the wound.  I used electrocautery and blunt dissection and I was able to  deliver what appeared to be a very firm, lipomatous-type mass measuring  3 cm from the wound.  I sent this to pathology.  I did not notice any  adenopathy.  This was the mass that was being palpated on the clinical  exam.   Following this I injected the wound with Marcaine  solution.  Bleeding  was controlled with electrocautery.  Once hemostasis was adequate, I  then closed subcutaneous tissue with running 3-0 Vicryl suture.  The  skin was closed with a 4-0 Monocryl subcuticular stitch, followed by  Steri-Strips and sterile dressings.   She tolerated the procedure without any apparent complications and was  taken to the recovery room in satisfactory condition.      Adolph Pollack, M.D.  Electronically Signed     TJR/MEDQ  D:  11/09/2006  T:  11/10/2006  Job:  [id number]   cc:   Al Decant. Janey Greaser, MD  Fax: [phone]   Stann Mainland. Vincente Poli, M.D.  Fax: [phone]

## 2011-02-19 NOTE — H&P (Signed)
[name], [name]                 ACCOUNT NO.:  [id number]   MEDICAL RECORD NO.:  [id number]          PATIENT TYPE:  AMB   LOCATION:  DAY                          FACILITY:  [facility]   PHYSICIAN:  Adolph Pollack, M.D.DATE OF BIRTH:  May 02, 1941   DATE OF ADMISSION:  05/20/2006  DATE OF DISCHARGE:                                HISTORY & PHYSICAL   REASON FOR ADMISSION:  Elective repair of epigastric ventral and umbilical  hernias.   HISTORY OF PRESENT ILLNESS:  [name] is a 70 year old female who has  noticed a bulge in her umbilicus, as well as one superior to this. She has  had a CT scan, which demonstrated an umbilical hernia and a small ventral  epigastric hernia, both of which have some fatty tissue in it. These are  becoming symptomatic. She now presents for repair.   PAST MEDICAL HISTORY:  1. Thyroid cancer.  2. Hypertension.  3. Melanoma.  4. Asthma.  5. Dyslipidemia.  6. Allergic rhinitis.  7. Depression.  8. Osteoporosis.  9. Mitral valve prolapse.  10.History of deep vein thrombosis in the past.   PAST SURGICAL HISTORY:  1. Hysterectomy.  2. Incision of chest wall melanoma with sentinel lymph node biopsy of the      right axilla.  3. Left breast biopsy.  4. Thyroidectomy.  5. Cataract extraction.  6. Urethral dilatation.  7. Right eye surgery for melanoma.  8. Right shoulder arthroscopy and right rotator cuff repair.   ALLERGIES:  ZOLOFT, SUDAFED, ANTIHISTAMINES, LATEX.   CURRENT MEDICATIONS:  Advair, L-thyroxine, Allegra, Lexapro, HCTZ, multiple  vitamins, Actonel, Mucinex p.r.n., Vytorin, Singulair, Lorazepam, Albuterol  inhaler p.r.n., eye drops.   SOCIAL HISTORY:  She is disabled, single with 2 children and some  grandchildren. She rarely has an alcoholic beverage and denies current  tobacco use.   REVIEW OF SYSTEMS:  Does have an occasional cough and uses her inhalers  occasionally. Has some arthritis to the fingers of the right hand.   PHYSICAL EXAMINATION:  GENERAL:  An anxious but very pleasant female. No  acute distress.  VITAL SIGNS:  Temperature 97. Heart rate 97. Blood pressure is 138/90.  Respiratory rate 20. She is 5 feet 1 1/2 inches tall. Weight about 72  kilograms.  HEENT:  Extraocular motions intact.  NECK:  Supple with a well healed lower transverse scar.  LUNGS:  Breath sounds equal with clear respirations, unlabored.  CHEST:  Demonstrates bandage over a previous excision of a skin mass  earlier, about a week ago.  CARDIOVASCULAR:  Regular rate and rhythm. There is a soft murmur heard.  ABDOMEN:  Soft, nontender. There is an umbilical bulge, which is reducible  and superior to this in the epigastric region, there is also a bulge that is  not completely reducible.  EXTREMITIES:  Good range of motion. Good muscle tone.   IMPRESSION:  Symptomatic epigastric ventral hernia. Also has an umbilical  hernia.   PLAN:  Laparoscopic repair of the hernias. We have discussed the procedure  and the risks pre-operatively.      Jim Desanctis.  Abbey Chatters, M.D.  Electronically Signed     TJR/MEDQ  D:  05/20/2006  T:  05/20/2006  Job:  [id number]

## 2011-02-19 NOTE — Assessment & Plan Note (Signed)
[facility]                                 ON-CALL NOTE   NAME:DAVISKysha, Muralles                        MRN:          [id number]  DATE:11/05/2007                            DOB:          16-Apr-1941    DATE OF NOTE:  November 05, 2007.   Ms. Laforte is a patient of Dr. Prescott Gum.  She sees him for mitral  valve prolapse.  She has a history of hypertension, takes  hydrochlorothiazide at home, as well as a history of stroke which she  attributes to a blood pressure greater than 175.  She does not normally  check a blood pressure routinely but yesterday she felt a little bit  lightheaded when bending down and standing up.  She checked a blood  pressure and it was in the  160s.  She checked again this morning was  163/90s.  She is asking what she should do.  She is fairly concerned.  I  have recommended that given her concerns - along with symptoms of  lightheadedness that she should probably be seen today either in Urgent  Care or the emergency room.  She may need adjustment of her blood  pressure medications which I am not willing to do over the phone.  She  does have a primary care  Crist Kruszka with [facility] family [facility].  She has  followup with Dr. Gala Romney in approximately 2 weeks and she plans  presents to the ED at [facility].  Of note, she has not been feeling  poorly since her lightheadedness spell yesterday.     Nicolasa Ducking, ANP  Electronically Signed    CB/MedQ  DD: 11/05/2007  DT: 11/05/2007  Job #: [id number]

## 2011-02-19 NOTE — Discharge Summary (Signed)
NAMEANGELIAH, [name]                 ACCOUNT NO.:  [id number]   MEDICAL RECORD NO.:  [id number]          PATIENT TYPE:  INP   LOCATION:  1619                         [facility]ACILITY:  [facility]   PHYSICIAN:  Adolph Pollack, M.D.DATE OF BIRTH:  1941-08-03   DATE OF ADMISSION:  05/20/2006  DATE OF DISCHARGE:  05/22/2006                                 DISCHARGE SUMMARY   PRINCIPAL DISCHARGE DIAGNOSIS:  Ventral and umbilical hernias.   SECONDARY DIAGNOSES:  1. Thyroid cancer.  2. Hypertension.  3. Melanoma.  4. Asthma.  5. Dyslipidemia.  6. Allergic rhinitis.  7. Depression.  8. Osteoporosis.  9. Mitral valve prolapse.   PROCEDURE:  Laparoscopic repair of the ventral and umbilical hernias with  mesh.   INDICATION:  This is a 70 year old female who has noticed a bulge in her  umbilicus and one superior to this.  A CT scan demonstrates two hernias.  These are somewhat symptomatic and she was admitted for repair.   HOSPITAL COURSE:  She underwent the above procedure.  On postoperative day  #1 she was quite sore, still requiring IV analgesia.  She was up and  walking.  Her asthma was asymptomatic on her inhalers as needed.  By her  second postoperative day she was passing some gas.  She was feeling much  better and able to be discharged.   DISPOSITION:  Discharged to home May 22, 2006, in satisfactory condition.  She was given discharge instructions and Tylox for pain, told to continue  her home medications.  She will follow up with me in 2-3 weeks in the office  or sooner if she has any questions or problems.      Adolph Pollack, M.D.  Electronically Signed     TJR/MEDQ  D:  06/15/2006  T:  06/15/2006  Job:  [id number]

## 2011-02-19 NOTE — Assessment & Plan Note (Signed)
[facility]                               PULMONARY OFFICE NOTE   NAME:[name], [name]                        MRN:          [id number]  DATE:05/10/2006                            DOB:          [birth year]    PROBLEM:  1.  Chronic and recurrent bronchitis.  2.  Multiple cancer surgeries/melanoma.   HISTORY:  She returns now feeling fairly stable, although saying that she  had a flare of shortness of breath after obvious dust exposure [place].  She controlled it  with her medications and is over it.  She is pending general anesthesia for  repair of abdominal hernia, and thinks later in the year she may need more  eye surgery for her melanoma.  She is trying to walk a mile and a half a  day, a little less now that it is so hot.  She denies chest pain,  palpitations, sputum or fever.  She very much liked the Advair 500/50 given  as a sample, but has dropped back to the 250/50 strength now, and that seems  to be holding her.  She has trouble managing a metered inhaler, and we think  that she will do better with a spacer, so that is going to be provided.  Refill of Allegra is requested.   MEDICATIONS:  1.  Allegra 60 mg b.i.d. p.r.n.  2.  HCTZ.  3.  Lexapro 10 mg.  4.  Advair 250/50.  5.  Singulair 10 mg.  6.  Vytorin.  7.  Mucinex DM.  8.  Actonel.  9.  Citracal with vitamin D.  10. Prilosec OTC.  11. Rescue albuterol inhaler used occasionally.  12. Tessalon Perles.   DRUG INTOLERANCE:  1.  LATEX EXPOSURE.  2.  PREDNISONE.  3.  ZOLOFT.  4.  NARCOTICS.  5.  Possibly to SUDAFED.   OBJECTIVE:  VITAL SIGNS:  Weight 157 pounds, blood pressure 122/66, pulse  regular and 96, room air saturation 96%.  GENERAL:  She is ambulatory with clear voice and unlabored breathing.  LUNGS:  Lung fields are very clear today with no rales, crackles, cough or  wheeze.  HEART:  Heart sounds are regular  without murmur or gallop.   LABORATORY DATA:  PFT's:  Spirometry done April 28, 2006 at [facility].  There was mild obstructive airways disease with significant  response to bronchodilator.  After dilator, FEV-1 was 1.94 (94% predicted).  FVC was 2.32 liters (90%).  Measured lung volumes were normal.  Measured  diffusion capacity was normal.  On a 6 minute walk test, distance walked was  not recorded by the technician, but oxygen saturation held 95-98% through  the walk and up to 5 minutes after the test with maximum heart rate of 102,  indicating satisfactory cardiopulmonary function for a 6 minute walk test.   IMPRESSION:  1.  Asthma with recurrent bronchitis, currently stable.  2.  She is as good as she is likely to get for necessary surgery under  anesthesia.  3.  Multiple health problems previously outlined.   PLAN:  1.  We refilled Allegra 60 mg b.i.d. p.r.n.  2.  Refill Advair 250/50 one b.i.d. p.r.n.  3.  Aerochamber spacer for her metered inhaler for rescue use up to q.i.d.      p.r.n.  4.  Schedule return to me in 2 months, earlier if p.r.n.                                   Krystal D. Maple Hudson, MD, FCCP, FACP   CDY/MedQ  DD:  05/10/2006  DT:  05/11/2006  Job #:  [id number]   cc:   Krystal Pollack, MD  Krystal Drown, MD  Krystal Mainland. Vincente Poli, MD  [name]. Krystal Howells, MD  Krystal Clement, MD

## 2011-03-24 ENCOUNTER — Encounter: Payer: Self-pay | Admitting: Cardiology

## 2011-03-29 ENCOUNTER — Other Ambulatory Visit: Payer: Medicare Other | Admitting: *Deleted

## 2011-03-30 ENCOUNTER — Other Ambulatory Visit: Payer: Self-pay | Admitting: *Deleted

## 2011-03-30 DIAGNOSIS — E785 Hyperlipidemia, unspecified: Secondary | ICD-10-CM

## 2011-03-31 ENCOUNTER — Ambulatory Visit (INDEPENDENT_AMBULATORY_CARE_PROVIDER_SITE_OTHER): Payer: Medicare Other | Admitting: Cardiology

## 2011-03-31 ENCOUNTER — Encounter: Payer: Self-pay | Admitting: Cardiology

## 2011-03-31 ENCOUNTER — Other Ambulatory Visit (INDEPENDENT_AMBULATORY_CARE_PROVIDER_SITE_OTHER): Payer: Medicare Other | Admitting: *Deleted

## 2011-03-31 DIAGNOSIS — Z85850 Personal history of malignant neoplasm of thyroid: Secondary | ICD-10-CM

## 2011-03-31 DIAGNOSIS — I119 Hypertensive heart disease without heart failure: Secondary | ICD-10-CM

## 2011-03-31 DIAGNOSIS — Z85820 Personal history of malignant melanoma of skin: Secondary | ICD-10-CM | POA: Insufficient documentation

## 2011-03-31 DIAGNOSIS — E7800 Pure hypercholesterolemia, unspecified: Secondary | ICD-10-CM | POA: Insufficient documentation

## 2011-03-31 DIAGNOSIS — E039 Hypothyroidism, unspecified: Secondary | ICD-10-CM | POA: Insufficient documentation

## 2011-03-31 DIAGNOSIS — E785 Hyperlipidemia, unspecified: Secondary | ICD-10-CM

## 2011-03-31 LAB — BASIC METABOLIC PANEL
BUN: 18 mg/dL (ref 6–23)
Creatinine, Ser: 0.8 mg/dL (ref 0.4–1.2)
GFR: 80.77 mL/min (ref >60.00)

## 2011-03-31 LAB — HEPATIC FUNCTION PANEL
Bilirubin, Direct: 0.2 mg/dL (ref 0.0–0.3)
Total Bilirubin: 1 mg/dL (ref 0.3–1.2)

## 2011-03-31 LAB — LIPID PANEL
Cholesterol: 173 mg/dL (ref 0–200)
HDL: 49.5 mg/dL (ref >39.00)
LDL Cholesterol: 105 mg/dL — ABNORMAL HIGH (ref 0–99)
Triglycerides: 95 mg/dL (ref 0.0–149.0)
VLDL: 19 mg/dL (ref 0.0–40.0)

## 2011-03-31 NOTE — Assessment & Plan Note (Signed)
Patient has a past history of hypercholesterolemia.  She has been able to lose weight.  She is on careful low-cholesterol diet.She remains on low dose simvastatin 20 mg a day without side effects or myalgias.

## 2011-03-31 NOTE — Progress Notes (Signed)
[name] Date of Birth:  [birth year] [facility] [address]   [address] [place], [place]  [address] [phone]           Fax   [phone]  History of Present Illness: This pleasant 70 year old woman is seen for a four-month followup office visit.  She has a history of hypothyroidism and essential hypertension and hypercholesterolemia.  She is followed by oncology.  She's had several types of previous malignancies including previous papillary thyroid cancer as well as a history of previous malignant melanoma involving the right eye and involving the chest wall.  He also has a history of a monoclonal gammopathy of uncertain significance.  He does not have any history of known coronary disease.  She had a normal nuclear stress test in 2006.  She had an echocardiogram in 2006 which showed no mitral valve prolapse.  She has had elevated cholesterol and has been on simvastatin.  Since we last saw her she was hospitalized for cellulitis of the right chest wall following a lumpectomy of 2 cysts which were just below her right breast.  She was hospitalized for 5 days.  Dr. Abbey Chatters is her surgeon.  Current Outpatient Prescriptions  Medication Sig Dispense Refill  . Acetaminophen (TYLENOL 8 HOUR PO) Take by mouth as needed.        [name] amLODipine (NORVASC) 5 MG tablet Take 5 mg by mouth daily.        [name] aspirin 81 MG tablet Take 81 mg by mouth daily.        . calcium citrate-vitamin D (CITRACAL+D) 315-200 MG-UNIT per tablet Take 1 tablet by mouth 3 (three) times daily.        . Cholecalciferol (VITAMIN D3) 1000 UNITS CAPS Take by mouth daily.        [name] dextromethorphan-guaiFENesin (MUCINEX DM) 30-600 MG per 12 hr tablet Take 1 tablet by mouth as needed.        . fexofenadine (ALLEGRA) 60 MG tablet Take 60 mg by mouth as needed.        . fluticasone-salmeterol (ADVAIR HFA) 115-21 MCG/ACT inhaler Inhale 2 puffs into the lungs as needed.        . hydrochlorothiazide 25 MG  tablet Take 25 mg by mouth daily.        [name] levothyroxine (SYNTHROID, LEVOTHROID) 112 MCG tablet Take 112 mcg by mouth daily.        . Omega-3 Fatty Acids (FISH OIL PO) Take by mouth.        . simvastatin (ZOCOR) 20 MG tablet Take 1 tablet (20 mg total) by mouth every evening.  90 tablet  3  . telmisartan (MICARDIS) 40 MG tablet Take 40 mg by mouth daily.        [name] DISCONTD: Calcium Carbonate-Vitamin D (CALCIUM + D PO) Take by mouth.        . DISCONTD: GARLIC PO Take by mouth daily.        [name] DISCONTD: Multiple Vitamin (MULTIVITAMIN) tablet Take 1 tablet by mouth daily.          Allergies  Allergen Reactions  . Hydrocodone   . Levaquin   . Sudafed (Pseudoephedrine Hcl)   . Sulfur     Patient Active Problem List  Diagnoses  . Benign hypertensive heart disease without heart failure  . Hypercholesterolemia  . Hypothyroidism  . History of melanoma  . Hx of thyroid cancer    History  Smoking status  . Never Smoker  Smokeless tobacco  . Not on file    History  Alcohol Use No    Family History  Problem Relation Age of Onset  . Stroke Mother   . Asthma Father   . Diabetes Father   . Cancer Father   . Cancer Sister   . Diabetes Sister   . Cancer Brother     Review of Systems: Constitutional: no fever chills diaphoresis or fatigue or change in weight.  Head and neck: no hearing loss, no epistaxis, no photophobia or visual disturbance. Respiratory: No cough, shortness of breath or wheezing. Cardiovascular: No chest pain peripheral edema, palpitations. Gastrointestinal: No abdominal distention, no abdominal pain, no change in bowel habits hematochezia or melena. Genitourinary: No dysuria, no frequency, no urgency, no nocturia. Musculoskeletal:No arthralgias, no back pain, no gait disturbance or myalgias. Neurological: No dizziness, no headaches, no numbness, no seizures, no syncope, no weakness, no tremors. Hematologic: No lymphadenopathy, no easy bruising. Psychiatric:  No confusion, no hallucinations, no sleep disturbance.    Physical Exam: Filed Vitals:   03/31/11 1035  BP: 100/70  Pulse: 80  The general appearance feels a well-developed well-nourished woman in no distress.The head and neck exam reveals pupils equal and reactive.  Extraocular movements are full.  There is no scleral icterus.  The mouth and pharynx are normal.  The neck is supple.  The carotids reveal no bruits.  The jugular venous pressure is normal.  The  thyroid is not enlarged.  There is no lymphadenopathy.  The chest is clear to percussion and auscultation.  There are no rales or rhonchi.  Expansion of the chest is symmetrical.  The precordium is quiet.  The first heart sound is normal.  The second heart sound is physiologically split.  There is no murmur gallop rub or click.  There is no abnormal lift or heave.  The abdomen is soft and nontender.  The bowel sounds are normal.  The liver and spleen are not enlarged.  There are no abdominal masses.  There are no abdominal bruits.  Extremities reveal good pedal pulses.  There is no phlebitis or edema.  There is no cyanosis or clubbing.  Strength is normal and symmetrical in all extremities.  There is no lateralizing weakness.  There are no sensory deficits.  The skin is warm and dry.  There is no rash.   Assessment / Plan: The patient is doing well with her diet.  We are checking lab work today.  She'll remain on same medication and be rechecked in 5 months for followup office visit and fasting lab work.

## 2011-03-31 NOTE — Assessment & Plan Note (Addendum)
The patient has a past history of essential hypertension.  He has not been experiencing any recent chest pain or increased shortness of breath.  She denies any dizziness or syncope.  She's not having a problem with headaches.She is tolerating her present blood pressure regimen of Amlodipine, Micardis, and hydrochlorothiazide

## 2011-04-02 ENCOUNTER — Telehealth: Payer: Self-pay | Admitting: *Deleted

## 2011-04-02 NOTE — Telephone Encounter (Signed)
Advised of labs 

## 2011-04-02 NOTE — Progress Notes (Signed)
Advised of labs 

## 2011-04-02 NOTE — Telephone Encounter (Signed)
Message copied by Burnell Blanks on Fri Apr 02, 2011  9:32 AM ------      Message from: Cassell Clement      Created: Thu Apr 01, 2011 10:32 AM       Please report.The kidney and liver tests are normal.Her lipids are satisfactory.  Continue same medication.

## 2011-04-13 ENCOUNTER — Other Ambulatory Visit: Payer: Self-pay | Admitting: Obstetrics and Gynecology

## 2011-04-13 ENCOUNTER — Other Ambulatory Visit: Payer: Self-pay | Admitting: Oncology

## 2011-04-13 DIAGNOSIS — C439 Malignant melanoma of skin, unspecified: Secondary | ICD-10-CM

## 2011-04-13 DIAGNOSIS — C73 Malignant neoplasm of thyroid gland: Secondary | ICD-10-CM

## 2011-04-13 DIAGNOSIS — N898 Other specified noninflammatory disorders of vagina: Secondary | ICD-10-CM

## 2011-04-13 DIAGNOSIS — R102 Pelvic and perineal pain: Secondary | ICD-10-CM

## 2011-04-14 ENCOUNTER — Ambulatory Visit
Admission: RE | Admit: 2011-04-14 | Discharge: 2011-04-14 | Disposition: A | Discharge status: Home / Self Care | Payer: Medicare Other | Source: Ambulatory Visit | Attending: Obstetrics and Gynecology | Admitting: Obstetrics and Gynecology

## 2011-04-14 DIAGNOSIS — R102 Pelvic and perineal pain: Secondary | ICD-10-CM

## 2011-04-14 DIAGNOSIS — N898 Other specified noninflammatory disorders of vagina: Secondary | ICD-10-CM

## 2011-04-14 MED ORDER — GADOBENATE DIMEGLUMINE 529 MG/ML IV SOLN
12.0000 mL | Freq: Once | INTRAVENOUS | Status: AC | PRN
  Administered 2011-04-14: 12 mL via INTRAVENOUS

## 2011-04-30 ENCOUNTER — Telehealth: Payer: Self-pay | Admitting: Cardiology

## 2011-04-30 NOTE — Telephone Encounter (Signed)
Britta Mccreedy from [facility] for Dr. Kinnie Scales for mutual patient called to request latest lab results for pt, faxed over Auth of Release for Med Recs signed by patient today, fax# [phone], phone # [phone], faxed latest lab results today

## 2011-05-04 ENCOUNTER — Other Ambulatory Visit: Payer: Self-pay | Admitting: Cardiology

## 2011-05-04 MED ORDER — TELMISARTAN 40 MG PO TABS: 40.0000 mg | ORAL_TABLET | Freq: Every day | ORAL | Status: DC

## 2011-05-04 NOTE — Telephone Encounter (Signed)
Patient needs script of Micardis 40mg , asap.

## 2011-05-04 NOTE — Telephone Encounter (Signed)
Patient requests call when this script is sent to [facility].

## 2011-05-04 NOTE — Telephone Encounter (Signed)
Med refill micardis

## 2011-05-05 ENCOUNTER — Other Ambulatory Visit: Payer: Self-pay | Admitting: Oncology

## 2011-05-05 ENCOUNTER — Encounter (HOSPITAL_BASED_OUTPATIENT_CLINIC_OR_DEPARTMENT_OTHER): Payer: Medicare Other | Admitting: Oncology

## 2011-05-05 DIAGNOSIS — Z85840 Personal history of malignant neoplasm of eye: Secondary | ICD-10-CM

## 2011-05-05 DIAGNOSIS — Z85850 Personal history of malignant neoplasm of thyroid: Secondary | ICD-10-CM

## 2011-05-05 DIAGNOSIS — D472 Monoclonal gammopathy: Secondary | ICD-10-CM

## 2011-05-05 DIAGNOSIS — R29898 Other symptoms and signs involving the musculoskeletal system: Secondary | ICD-10-CM

## 2011-05-05 LAB — CBC WITH DIFFERENTIAL/PLATELET
BASO%: 0.5 % (ref 0.0–2.0)
Basophils Absolute: 0 10*3/uL (ref 0.0–0.1)
EOS%: 2.6 % (ref 0.0–7.0)
MCH: 30.3 pg (ref 25.1–34.0)
MCHC: 35 g/dL (ref 31.5–36.0)
MCV: 86.6 fL (ref 79.5–101.0)
MONO%: 6.5 % (ref 0.0–14.0)
RBC: 4.51 10*6/uL (ref 3.70–5.45)
RDW: 12.3 % (ref 11.2–14.5)

## 2011-05-06 ENCOUNTER — Encounter (HOSPITAL_COMMUNITY)
Admission: RE | Admit: 2011-05-06 | Discharge: 2011-05-06 | Disposition: A | Discharge status: Home / Self Care | Payer: Medicare Other | Source: Ambulatory Visit | Attending: Oncology | Admitting: Oncology

## 2011-05-06 ENCOUNTER — Encounter (HOSPITAL_COMMUNITY): Payer: Self-pay

## 2011-05-06 DIAGNOSIS — Z90710 Acquired absence of both cervix and uterus: Secondary | ICD-10-CM | POA: Insufficient documentation

## 2011-05-06 DIAGNOSIS — C6990 Malignant neoplasm of unspecified site of unspecified eye: Secondary | ICD-10-CM | POA: Insufficient documentation

## 2011-05-06 DIAGNOSIS — J8410 Pulmonary fibrosis, unspecified: Secondary | ICD-10-CM | POA: Insufficient documentation

## 2011-05-06 DIAGNOSIS — C52 Malignant neoplasm of vagina: Secondary | ICD-10-CM | POA: Insufficient documentation

## 2011-05-06 DIAGNOSIS — C4359 Malignant melanoma of other part of trunk: Secondary | ICD-10-CM | POA: Insufficient documentation

## 2011-05-06 DIAGNOSIS — C439 Malignant melanoma of skin, unspecified: Secondary | ICD-10-CM

## 2011-05-06 DIAGNOSIS — K7689 Other specified diseases of liver: Secondary | ICD-10-CM | POA: Insufficient documentation

## 2011-05-06 DIAGNOSIS — C73 Malignant neoplasm of thyroid gland: Secondary | ICD-10-CM

## 2011-05-06 DIAGNOSIS — M47817 Spondylosis without myelopathy or radiculopathy, lumbosacral region: Secondary | ICD-10-CM | POA: Insufficient documentation

## 2011-05-06 DIAGNOSIS — Q619 Cystic kidney disease, unspecified: Secondary | ICD-10-CM | POA: Insufficient documentation

## 2011-05-06 DIAGNOSIS — K449 Diaphragmatic hernia without obstruction or gangrene: Secondary | ICD-10-CM | POA: Insufficient documentation

## 2011-05-06 LAB — KAPPA/LAMBDA LIGHT CHAINS
Kappa free light chain: 0.03 mg/dL — ABNORMAL LOW (ref 0.33–1.94)
Kappa:Lambda Ratio: 0 — ABNORMAL LOW (ref 0.26–1.65)
Lambda Free Lght Chn: 15.9 mg/dL — ABNORMAL HIGH (ref 0.57–2.63)

## 2011-05-06 LAB — COMPREHENSIVE METABOLIC PANEL
Albumin: 4.4 g/dL (ref 3.5–5.2)
BUN: 8 mg/dL (ref 6–23)
Calcium: 10 mg/dL (ref 8.4–10.5)
Chloride: 101 mEq/L (ref 96–112)
Glucose, Bld: 93 mg/dL (ref 70–99)
Potassium: 4 mEq/L (ref 3.5–5.3)

## 2011-05-06 MED ORDER — FLUDEOXYGLUCOSE F - 18 (FDG) INJECTION
17.4000 | Freq: Once | INTRAVENOUS | Status: AC | PRN
  Administered 2011-05-06: 17.4 via INTRAVENOUS

## 2011-05-08 ENCOUNTER — Ambulatory Visit (HOSPITAL_COMMUNITY)
Admission: RE | Admit: 2011-05-08 | Discharge: 2011-05-08 | Disposition: A | Discharge status: Home / Self Care | Payer: Medicare Other | Source: Ambulatory Visit | Attending: Oncology | Admitting: Oncology

## 2011-05-08 DIAGNOSIS — C73 Malignant neoplasm of thyroid gland: Secondary | ICD-10-CM

## 2011-05-08 DIAGNOSIS — K7689 Other specified diseases of liver: Secondary | ICD-10-CM | POA: Insufficient documentation

## 2011-05-08 DIAGNOSIS — Z90710 Acquired absence of both cervix and uterus: Secondary | ICD-10-CM | POA: Insufficient documentation

## 2011-05-08 DIAGNOSIS — Z85850 Personal history of malignant neoplasm of thyroid: Secondary | ICD-10-CM | POA: Insufficient documentation

## 2011-05-08 DIAGNOSIS — Z85820 Personal history of malignant melanoma of skin: Secondary | ICD-10-CM | POA: Insufficient documentation

## 2011-05-08 DIAGNOSIS — K6289 Other specified diseases of anus and rectum: Secondary | ICD-10-CM | POA: Insufficient documentation

## 2011-05-08 DIAGNOSIS — K5730 Diverticulosis of large intestine without perforation or abscess without bleeding: Secondary | ICD-10-CM | POA: Insufficient documentation

## 2011-05-08 DIAGNOSIS — D739 Disease of spleen, unspecified: Secondary | ICD-10-CM | POA: Insufficient documentation

## 2011-05-08 DIAGNOSIS — I517 Cardiomegaly: Secondary | ICD-10-CM | POA: Insufficient documentation

## 2011-05-08 DIAGNOSIS — C439 Malignant melanoma of skin, unspecified: Secondary | ICD-10-CM

## 2011-05-08 MED ORDER — GADOBENATE DIMEGLUMINE 529 MG/ML IV SOLN
12.0000 mL | Freq: Once | INTRAVENOUS | Status: AC | PRN
  Administered 2011-05-08: 12 mL via INTRAVENOUS

## 2011-05-08 MED ORDER — GADOBENATE DIMEGLUMINE 529 MG/ML IV SOLN
12.0000 mL/kg | Freq: Once | INTRAVENOUS | Status: AC | PRN
  Administered 2011-05-08: 11:00:00 via INTRAVENOUS

## 2011-07-05 LAB — CREATININE, SERUM
Creatinine, Ser: 0.75
GFR calc non Af Amer: >60

## 2011-07-06 ENCOUNTER — Other Ambulatory Visit (HOSPITAL_COMMUNITY): Payer: Medicare Other

## 2011-07-06 LAB — POCT I-STAT, CHEM 8
BUN: 19
Calcium, Ion: 1.06 — ABNORMAL LOW
Creatinine, Ser: 0.9
Glucose, Bld: 102 — ABNORMAL HIGH
TCO2: 27

## 2011-07-06 LAB — DIFFERENTIAL
Basophils Absolute: 0
Basophils Relative: 1
Eosinophils Absolute: 0.2
Eosinophils Relative: 3
Lymphocytes Relative: 35
Lymphs Abs: 3
Monocytes Absolute: 0.6
Monocytes Relative: 7
Neutro Abs: 4.8
Neutrophils Relative %: 56

## 2011-07-06 LAB — CBC
HCT: 39.4
Hemoglobin: 13.5
MCHC: 34.3
MCV: 87.1
Platelets: 275
RBC: 4.53
RDW: 13.6
WBC: 8.6

## 2011-07-09 ENCOUNTER — Emergency Department (HOSPITAL_COMMUNITY)
Admission: EM | Admit: 2011-07-09 | Discharge: 2011-07-09 | Disposition: A | Discharge status: Home / Self Care | Payer: Medicare Other | Attending: Emergency Medicine | Admitting: Emergency Medicine

## 2011-07-09 ENCOUNTER — Other Ambulatory Visit (HOSPITAL_COMMUNITY): Payer: Medicare Other

## 2011-07-09 ENCOUNTER — Emergency Department (HOSPITAL_COMMUNITY): Payer: Medicare Other

## 2011-07-09 DIAGNOSIS — I10 Essential (primary) hypertension: Secondary | ICD-10-CM | POA: Insufficient documentation

## 2011-07-09 DIAGNOSIS — E039 Hypothyroidism, unspecified: Secondary | ICD-10-CM | POA: Insufficient documentation

## 2011-07-09 DIAGNOSIS — N949 Unspecified condition associated with female genital organs and menstrual cycle: Secondary | ICD-10-CM | POA: Insufficient documentation

## 2011-07-09 DIAGNOSIS — Z85850 Personal history of malignant neoplasm of thyroid: Secondary | ICD-10-CM | POA: Insufficient documentation

## 2011-07-09 DIAGNOSIS — K5289 Other specified noninfective gastroenteritis and colitis: Secondary | ICD-10-CM | POA: Insufficient documentation

## 2011-07-09 DIAGNOSIS — R109 Unspecified abdominal pain: Secondary | ICD-10-CM | POA: Insufficient documentation

## 2011-07-09 LAB — URINALYSIS, ROUTINE W REFLEX MICROSCOPIC
Ketones, ur: NEGATIVE mg/dL
Leukocytes, UA: NEGATIVE
Nitrite: NEGATIVE
Protein, ur: NEGATIVE mg/dL
Urobilinogen, UA: 0.2 mg/dL (ref 0.0–1.0)

## 2011-07-09 LAB — BASIC METABOLIC PANEL
CO2: 26 mEq/L (ref 19–32)
Calcium: 10.3 mg/dL (ref 8.4–10.5)
GFR calc Af Amer: >90 mL/min (ref >90)
GFR calc non Af Amer: >90 mL/min (ref >90)
Sodium: 137 mEq/L (ref 135–145)

## 2011-07-09 LAB — CBC
Hemoglobin: 13 g/dL (ref 12.0–15.0)
MCH: 29.2 pg (ref 26.0–34.0)
MCHC: 33.9 g/dL (ref 30.0–36.0)

## 2011-07-09 LAB — LIPASE, BLOOD: Lipase: 57 U/L (ref 11–59)

## 2011-07-09 LAB — DIFFERENTIAL
Basophils Relative: 0 % (ref 0–1)
Eosinophils Absolute: 0.2 10*3/uL (ref 0.0–0.7)
Monocytes Absolute: 0.6 10*3/uL (ref 0.1–1.0)
Monocytes Relative: 7 % (ref 3–12)
Neutro Abs: 5.1 10*3/uL (ref 1.7–7.7)

## 2011-07-09 LAB — HEPATIC FUNCTION PANEL
Alkaline Phosphatase: 54 U/L (ref 39–117)
Indirect Bilirubin: 0.4 mg/dL (ref 0.3–0.9)
Total Protein: 7.9 g/dL (ref 6.0–8.3)

## 2011-07-09 MED ORDER — IOHEXOL 300 MG/ML  SOLN
100.0000 mL | Freq: Once | INTRAMUSCULAR | Status: AC | PRN
  Administered 2011-07-09: 80 mL via INTRAVENOUS

## 2011-07-26 DIAGNOSIS — K5730 Diverticulosis of large intestine without perforation or abscess without bleeding: Secondary | ICD-10-CM | POA: Insufficient documentation

## 2011-07-27 ENCOUNTER — Other Ambulatory Visit (HOSPITAL_COMMUNITY): Payer: Self-pay | Admitting: Obstetrics and Gynecology

## 2011-08-04 ENCOUNTER — Ambulatory Visit (INDEPENDENT_AMBULATORY_CARE_PROVIDER_SITE_OTHER): Payer: Medicare Other | Admitting: Nurse Practitioner

## 2011-08-04 ENCOUNTER — Encounter: Payer: Self-pay | Admitting: Nurse Practitioner

## 2011-08-04 DIAGNOSIS — R0789 Other chest pain: Secondary | ICD-10-CM | POA: Insufficient documentation

## 2011-08-04 DIAGNOSIS — R0600 Dyspnea, unspecified: Secondary | ICD-10-CM

## 2011-08-04 DIAGNOSIS — E785 Hyperlipidemia, unspecified: Secondary | ICD-10-CM

## 2011-08-04 DIAGNOSIS — R079 Chest pain, unspecified: Secondary | ICD-10-CM

## 2011-08-04 DIAGNOSIS — I119 Hypertensive heart disease without heart failure: Secondary | ICD-10-CM

## 2011-08-04 DIAGNOSIS — E7800 Pure hypercholesterolemia, unspecified: Secondary | ICD-10-CM

## 2011-08-04 DIAGNOSIS — R0609 Other forms of dyspnea: Secondary | ICD-10-CM

## 2011-08-04 LAB — CBC WITH DIFFERENTIAL/PLATELET
Basophils Absolute: 0 10*3/uL (ref 0.0–0.1)
Basophils Relative: 0.5 % (ref 0.0–3.0)
Eosinophils Absolute: 0.1 10*3/uL (ref 0.0–0.7)
Eosinophils Relative: 2.1 % (ref 0.0–5.0)
HCT: 38.4 % (ref 36.0–46.0)
Hemoglobin: 12.9 g/dL (ref 12.0–15.0)
Lymphocytes Relative: 26.8 % (ref 12.0–46.0)
Lymphs Abs: 1.9 10*3/uL (ref 0.7–4.0)
MCHC: 33.7 g/dL (ref 30.0–36.0)
MCV: 89.5 fl (ref 78.0–100.0)
Monocytes Absolute: 0.5 10*3/uL (ref 0.1–1.0)
Monocytes Relative: 7.7 % (ref 3.0–12.0)
Neutro Abs: 4.4 10*3/uL (ref 1.4–7.7)
Neutrophils Relative %: 62.9 % (ref 43.0–77.0)
Platelets: 293 10*3/uL (ref 150.0–400.0)
RBC: 4.29 Mil/uL (ref 3.87–5.11)
RDW: 13.7 % (ref 11.5–14.6)
WBC: 7.1 10*3/uL (ref 4.5–10.5)

## 2011-08-04 LAB — HEPATIC FUNCTION PANEL
ALT: 21 U/L (ref 0–35)
AST: 23 U/L (ref 0–37)
Albumin: 4.2 g/dL (ref 3.5–5.2)
Alkaline Phosphatase: 42 U/L (ref 39–117)
Bilirubin, Direct: 0 mg/dL (ref 0.0–0.3)
Total Bilirubin: 0.6 mg/dL (ref 0.3–1.2)
Total Protein: 7.6 g/dL (ref 6.0–8.3)

## 2011-08-04 LAB — BASIC METABOLIC PANEL
BUN: 12 mg/dL (ref 6–23)
CO2: 26 mEq/L (ref 19–32)
Calcium: 9.5 mg/dL (ref 8.4–10.5)
Chloride: 103 mEq/L (ref 96–112)
Creatinine, Ser: 0.7 mg/dL (ref 0.4–1.2)
GFR: 81.95 mL/min (ref >60.00)
Glucose, Bld: 91 mg/dL (ref 70–99)
Potassium: 3.7 mEq/L (ref 3.5–5.1)
Sodium: 140 mEq/L (ref 135–145)

## 2011-08-04 LAB — LIPID PANEL
Cholesterol: 180 mg/dL (ref 0–200)
HDL: 60.5 mg/dL (ref >39.00)
LDL Cholesterol: 95 mg/dL (ref 0–99)
Total CHOL/HDL Ratio: 3
Triglycerides: 125 mg/dL (ref 0.0–149.0)
VLDL: 25 mg/dL (ref 0.0–40.0)

## 2011-08-04 LAB — BRAIN NATRIURETIC PEPTIDE: Pro B Natriuretic peptide (BNP): 70 pg/mL (ref 0.0–100.0)

## 2011-08-04 NOTE — Assessment & Plan Note (Signed)
Blood pressure looks ok. Samples of her Micardis are given today. No changes in her medicines.

## 2011-08-04 NOTE — Patient Instructions (Signed)
We are going to arrange for a stress test to evaluate your chest pain.  We will check your labs today.

## 2011-08-04 NOTE — Assessment & Plan Note (Signed)
Labs will be checked today. 

## 2011-08-04 NOTE — Assessment & Plan Note (Signed)
She has multiple complaints. Her chest pain does not sound cardiac. Not sure what her disposition is regarding this fistula. May need surgery. Will arrange for repeat stress testing. Patient is agreeable to this plan and will call if any problems develop in the interim.

## 2011-08-04 NOTE — Progress Notes (Signed)
[name] Date of Birth: 06-15-1941 Medical Record #[id number]  History of Present Illness: [name] is seen today for a work in visit. She is seen for Dr. Patty Sermons. She has multiple issues and her history is hard to follow. She has not been feeling well for quite some time. She notes that "her bottom fell out". 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 She has been on several rounds of antibiotics. Finally had colonoscopy last week and has polyps. She has been seeing multiple doctors. She comes here today because she feels "sick in the head". She has had some fevers, occasional vomiting with chest discomfort. This has been going on since early in the week. She feels "weak" in her chest. She is not very specific. Has lots of congestion. Feels run down and not eating well also.   She has hypothyroidism, HTN, and hyperlipidemia. She is due for labs today. She has had several malignancies. No known CAD. Last stress test was in 2006. Normal echo in 2006.   Current Outpatient Prescriptions on File Prior to Visit  Medication Sig Dispense Refill  . Acetaminophen (TYLENOL 8 HOUR PO) Take by mouth as needed.        [name] amLODipine (NORVASC) 5 MG tablet Take 5 mg by mouth daily.        [name] amoxicillin (AMOXIL) 500 MG capsule Take 500 mg by mouth 2 (two) times daily.        [name] aspirin 81 MG tablet Take 81 mg by mouth daily.        . calcium citrate-vitamin D (CITRACAL+D) 315-200 MG-UNIT per tablet Take 1 tablet by mouth 3 (three) times daily.        [name] dextromethorphan-guaiFENesin (MUCINEX DM) 30-600 MG per 12 hr tablet Take 1 tablet by mouth as needed.        . fexofenadine (ALLEGRA) 60 MG tablet Take 60 mg by mouth as needed.        . fluticasone-salmeterol (ADVAIR HFA) 115-21 MCG/ACT inhaler Inhale 2 puffs into the lungs as needed.        . hydrochlorothiazide 25 MG tablet Take 25 mg by mouth daily.        [name] levothyroxine (SYNTHROID, LEVOTHROID) 112 MCG tablet Take 112 mcg by mouth daily.        .  Omega-3 Fatty Acids (FISH OIL PO) Take by mouth.        . Probiotic Product (PROBIOTIC PO) Take by mouth daily.        . simvastatin (ZOCOR) 20 MG tablet Take 1 tablet (20 mg total) by mouth every evening.  90 tablet  3  . telmisartan (MICARDIS) 40 MG tablet Take 1 tablet (40 mg total) by mouth daily.  90 tablet  4    Allergies  Allergen Reactions  . Hydrocodone   . Levaquin   . Sudafed (Pseudoephedrine Hcl)   . Sulfur     Past Medical History  Diagnosis Date  . 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 Cancer     THYROID, SKIN, NOSE  . History of blood clots     LEG  . Stroke   . Hernia   . MGUS (monoclonal gammopathy of unknown significance)   . Chronic kidney disease   . Hypertension   . Hyperlipidemia   . Thyroid disease   . Arthritis     Past Surgical History  Procedure Date  . Abdominal hysterectomy   . Rotator cuff repair 2004  RIGHT SHOULDER  . Hernia repair   . Leg surgery     VEIN & BLOOD CLOT  . Cardiovascular stress test 2006    NORMAL  . Transthoracic echocardiogram 2006    History  Smoking status  . Never Smoker   Smokeless tobacco  . Not on file    History  Alcohol Use No    Family History  Problem Relation Age of Onset  . Stroke Mother   . Asthma Father   . Diabetes Father   . Cancer Father   . Cancer Sister   . Diabetes Sister   . Cancer Brother     Review of Systems: The review of systems is per the HPI.  All other systems were reviewed and are negative.  Physical Exam: BP 118/78  Pulse 71  Ht 5' 1.5" (1.562 m)  Wt 141 lb 1.9 oz (64.012 kg)  BMI 26.23 kg/m2 Patient is quite anxious and in no acute distress. Skin is warm and dry. Color is normal.  HEENT is unremarkable. Normocephalic/atraumatic. PERRL. Sclera are nonicteric. Neck is supple. No masses. No JVD. Lungs are clear. Cardiac exam shows a regular rate and rhythm. Abdomen is soft. Extremities are without edema. Gait and ROM are intact. No gross neurologic deficits  noted.   LABORATORY DATA:  EKG shows sinus rhythm with no acute changes.  Assessment / Plan:

## 2011-08-06 ENCOUNTER — Other Ambulatory Visit: Payer: Self-pay | Admitting: *Deleted

## 2011-08-09 ENCOUNTER — Telehealth: Payer: Self-pay | Admitting: *Deleted

## 2011-08-09 ENCOUNTER — Telehealth: Payer: Self-pay | Admitting: Cardiology

## 2011-08-09 NOTE — Telephone Encounter (Signed)
Message copied by Burnell Blanks on Mon Aug 09, 2011  8:25 AM ------      Message from: Rosalio Macadamia      Created: Thu Aug 05, 2011  7:52 AM       Ok to report. Labs are normal.

## 2011-08-09 NOTE — Telephone Encounter (Signed)
Will get order for nuclear cancelled

## 2011-08-09 NOTE — Telephone Encounter (Signed)
Advised of labs.  Patient would like to cancel stress test.  She found out her thyroid was way off per test done at Dr Willeen Cass office.  If you feel she really needs it, will have it.  Just thinks she will feel better once her thyroid is normal.  Please advise

## 2011-08-09 NOTE — Telephone Encounter (Signed)
Will see how she does after getting thyroid stabilized. Ok to cancel stress test. Will need to reschedule if symptoms recur.

## 2011-08-11 ENCOUNTER — Other Ambulatory Visit (HOSPITAL_COMMUNITY): Payer: Medicare Other | Admitting: Radiology

## 2011-08-12 ENCOUNTER — Other Ambulatory Visit (HOSPITAL_COMMUNITY): Payer: Medicare Other | Admitting: Radiology

## 2011-08-16 ENCOUNTER — Ambulatory Visit: Payer: Medicare Other | Admitting: Cardiology

## 2011-09-14 ENCOUNTER — Telehealth: Payer: Self-pay | Admitting: Cardiology

## 2011-09-14 ENCOUNTER — Encounter: Payer: Self-pay | Admitting: Nurse Practitioner

## 2011-09-14 ENCOUNTER — Ambulatory Visit (INDEPENDENT_AMBULATORY_CARE_PROVIDER_SITE_OTHER): Payer: Medicare Other | Admitting: Nurse Practitioner

## 2011-09-14 ENCOUNTER — Ambulatory Visit (INDEPENDENT_AMBULATORY_CARE_PROVIDER_SITE_OTHER)
Admission: RE | Admit: 2011-09-14 | Discharge: 2011-09-14 | Disposition: A | Discharge status: Home / Self Care | Payer: Medicare Other | Source: Ambulatory Visit | Attending: Nurse Practitioner | Admitting: Nurse Practitioner

## 2011-09-14 VITALS — BP 122/86 | HR 62 | Ht 62.0 in | Wt 140.8 lb

## 2011-09-14 DIAGNOSIS — R42 Dizziness and giddiness: Secondary | ICD-10-CM | POA: Insufficient documentation

## 2011-09-14 MED ORDER — MECLIZINE HCL 25 MG PO TABS: 25.0000 mg | ORAL_TABLET | Freq: Three times a day (TID) | ORAL | Status: AC | PRN

## 2011-09-14 NOTE — Assessment & Plan Note (Addendum)
This really sounds like vertigo to me. It is positional in nature. She has had a recent cold. She is having some tingling down her left arm. With her history of stroke, we will proceed on with CT of the head without contrast. If this is ok, it is recommended that she try Meclizine 25 mg TID. Patient is agreeable to this plan and will call if any problems develop in the interim.   Her CT is negative for acute abnormality. We will try the Meclizine.

## 2011-09-14 NOTE — Telephone Encounter (Signed)
Pt calling back and will see the PA instead if can see sooner

## 2011-09-14 NOTE — Progress Notes (Signed)
[name] Date of Birth: January 24, 1941 Medical Record #[id number]  History of Present Illness: [name] is seen today for a work in visit. She is seen for Dr. Patty Sermons. She has multiple issues which include hypothyroidism, past stroke, HTN, hyperlipidemia and multiple malignancies. She is here today because of dizzy spells. This has been going on for a few days. Worse with turning and moving her head. Saw ENT yesterday and was told that her ears were clear. Thought it might be her blood pressure and was referred back here. Her blood pressure readings from home are fine and her blood pressure here today is normal. She also has some tingling down the right arm. She is very anxious.  She cancelled her stress test last month. Thyroid medicine has recently been increased. She had a normal echo in 2006 and normal stress test in 2006.   Current Outpatient Prescriptions on File Prior to Visit  Medication Sig Dispense Refill  . Acetaminophen (TYLENOL 8 HOUR PO) Take by mouth as needed.        [name] amLODipine (NORVASC) 5 MG tablet Take 5 mg by mouth daily.        [name] aspirin 81 MG tablet Take 81 mg by mouth daily.        . calcium citrate-vitamin D (CITRACAL+D) 315-200 MG-UNIT per tablet Take 1 tablet by mouth daily.       [name] dextromethorphan-guaiFENesin (MUCINEX DM) 30-600 MG per 12 hr tablet Take 1 tablet by mouth as needed.        . fexofenadine (ALLEGRA) 60 MG tablet Take 60 mg by mouth as needed.        . fluticasone-salmeterol (ADVAIR HFA) 115-21 MCG/ACT inhaler Inhale 2 puffs into the lungs as needed.        . hydrochlorothiazide 25 MG tablet Take 25 mg by mouth daily.        . Omega-3 Fatty Acids (FISH OIL PO) Take by mouth.        . Probiotic Product (PROBIOTIC PO) Take by mouth daily.        . simvastatin (ZOCOR) 20 MG tablet Take 1 tablet (20 mg total) by mouth every evening.  90 tablet  3  . telmisartan (MICARDIS) 40 MG tablet Take 1 tablet (40 mg total) by mouth daily.  90 tablet  4     Allergies  Allergen Reactions  . Hydrocodone   . Levaquin   . Sudafed (Pseudoephedrine Hcl)   . Sulfur     Past Medical History  Diagnosis Date  . 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 Cancer     THYROID, SKIN, NOSE  . History of blood clots     LEG  . Stroke   . Hernia   . MGUS (monoclonal gammopathy of unknown significance)   . Chronic kidney disease   . Hypertension   . Hyperlipidemia   . Thyroid disease   . Arthritis     Past Surgical History  Procedure Date  . Abdominal hysterectomy   . Rotator cuff repair 2004    RIGHT SHOULDER  . Hernia repair   . Leg surgery     VEIN & BLOOD CLOT  . Cardiovascular stress test 2006    NORMAL  . Transthoracic echocardiogram 2006    History  Smoking status  . Never Smoker   Smokeless tobacco  . Not on file    History  Alcohol Use No    Family History  Problem Relation  Age of Onset  . Stroke Mother   . Asthma Father   . Diabetes Father   . Cancer Father   . Cancer Sister   . Diabetes Sister   . Cancer Brother     Review of Systems: The review of systems is per the HPI. She has had a recent cold last week. Very mild nausea. No other neurologic issues.  All other systems were reviewed and are negative.  Physical Exam: BP 122/86  Pulse 62  Ht 5\' 2"  (1.575 m)  Wt 140 lb 12.8 oz (63.866 kg)  BMI 25.75 kg/m2 Patient is quite anxious but in no acute distress. Skin is warm and dry. Color is normal.  HEENT is unremarkable. Normocephalic/atraumatic. PERRL. Sclera are nonicteric. Neck is supple. No masses. No JVD. Lungs are clear. Cardiac exam shows a regular rate and rhythm. Abdomen is soft. Extremities are without edema. Gait and ROM are intact. No gross neurologic deficits noted.   LABORATORY DATA: EKG shows sinus rhythm and is normal.    Assessment / Plan:

## 2011-09-14 NOTE — Patient Instructions (Signed)
We are going to get a scan on your head today.  I would recommend getting Meclizine (Antivert) at the drug store - 25 mg and take 3 times a day as needed.  We will see you back at your regular time.  Call the [facility] office at [phone] if you have any questions, problems or concerns.

## 2011-09-14 NOTE — Telephone Encounter (Signed)
SPOKE WITH PT IN GREAT LENGTH HAS  SEVERAL HEALTH ISSUES  IS COMPLAINING  ABOUT  ELEVATED B/P , DIZZINESS ,AND  EPISODE OF CHEST PAIN  SAT NOC  WANTING AN EARLIER APPT , APPT  MADE WITH LORI GERHARDT  NP  FOR TODAY AT  3:30 PM

## 2011-09-14 NOTE — Telephone Encounter (Signed)
Pt has been dizzy for about two weeks and b/p is elevated and having pain in her left arm and she wants to come in sooner to see Dr. Patty Sermons

## 2011-09-15 ENCOUNTER — Telehealth: Payer: Self-pay | Admitting: Cardiology

## 2011-09-15 NOTE — Telephone Encounter (Signed)
New Msg: Pt calling wanting to discuss with doctor possible drug interaction between dramamine and zanax. Please return pt call to discuss further.

## 2011-09-15 NOTE — Telephone Encounter (Signed)
Advised ok to take both (per  Dr. Patty Sermons), try to space out since they both can cause drowsiness

## 2011-09-20 ENCOUNTER — Telehealth: Payer: Self-pay | Admitting: Cardiology

## 2011-09-20 NOTE — Telephone Encounter (Signed)
Scheduled appointment for tomorrow with  Dr. Patty Sermons

## 2011-09-20 NOTE — Telephone Encounter (Signed)
New message:  Pt called and is still having left arm and forehead hurting, BP is still up and down.  She has had a past CVA and is concerned about the symptoms.  Been taking the prescribed medication for dizziness but only helps for a little while.  Just wanted to let you know.  Please call and advise.

## 2011-09-21 ENCOUNTER — Ambulatory Visit (INDEPENDENT_AMBULATORY_CARE_PROVIDER_SITE_OTHER): Payer: Medicare Other | Admitting: Cardiology

## 2011-09-21 ENCOUNTER — Encounter: Payer: Self-pay | Admitting: Cardiology

## 2011-09-21 DIAGNOSIS — R05 Cough: Secondary | ICD-10-CM

## 2011-09-21 DIAGNOSIS — I119 Hypertensive heart disease without heart failure: Secondary | ICD-10-CM

## 2011-09-21 DIAGNOSIS — R059 Cough, unspecified: Secondary | ICD-10-CM

## 2011-09-21 DIAGNOSIS — J45909 Unspecified asthma, uncomplicated: Secondary | ICD-10-CM | POA: Insufficient documentation

## 2011-09-21 MED ORDER — OSELTAMIVIR PHOSPHATE 75 MG PO CAPS: 75.0000 mg | ORAL_CAPSULE | Freq: Two times a day (BID) | ORAL | Status: AC

## 2011-09-21 NOTE — Assessment & Plan Note (Signed)
The patient checked her blood pressure multiple times during the night last night it was as high as the 165.  We'll reason she came to the office today was because of concern about her blood pressure and fear that she would have a stroke.  I checked her blood pressure myself in the right arm and it was normal at 126/78

## 2011-09-21 NOTE — Patient Instructions (Addendum)
Tamiflu sent to [facility] Please contact your Primary Care Physician in the future for any non cardiac related symptoms  Follow up as needed

## 2011-09-21 NOTE — Progress Notes (Signed)
[name] Date of Birth:  [birth year] [facility] [address]   [address] [place], [place]  [address] [phone]           Fax   [phone]  HPI: This 70 year old woman is seen as a work in the office visit.  She had called yesterday complaining of some left arm discomfort.  Overnight last night she also develops new symptoms.  She developed cough and runny nose and mucoid sputum.  He also complains of aching all over.  She states that even her eyeballs hurt.  She did not mention any specific left arm discomfort today.  She did previously take the flu shot this year.  Current Outpatient Prescriptions  Medication Sig Dispense Refill  . Acetaminophen (TYLENOL 8 HOUR PO) Take by mouth as needed.        [name] amLODipine (NORVASC) 5 MG tablet Take 5 mg by mouth daily.        [name] aspirin 81 MG tablet Take 81 mg by mouth daily.        . calcium citrate-vitamin D (CITRACAL+D) 315-200 MG-UNIT per tablet Take 1 tablet by mouth daily.       [name] dextromethorphan-guaiFENesin (MUCINEX DM) 30-600 MG per 12 hr tablet Take 1 tablet by mouth as needed.        . fexofenadine (ALLEGRA) 60 MG tablet Take 60 mg by mouth as needed.        . fluticasone-salmeterol (ADVAIR HFA) 115-21 MCG/ACT inhaler Inhale 2 puffs into the lungs as needed.        . hydrochlorothiazide 25 MG tablet Take 25 mg by mouth daily.        [name] levothyroxine (SYNTHROID, LEVOTHROID) 125 MCG tablet Take 125 mcg by mouth daily.        . meclizine (ANTIVERT) 25 MG tablet Take 1 tablet (25 mg total) by mouth 3 (three) times daily as needed.  30 tablet  0  . Omega-3 Fatty Acids (FISH OIL PO) Take by mouth.        . Probiotic Product (PROBIOTIC PO) Take by mouth daily.        . simvastatin (ZOCOR) 20 MG tablet Take 1 tablet (20 mg total) by mouth every evening.  90 tablet  3  . telmisartan (MICARDIS) 40 MG tablet Take 1 tablet (40 mg total) by mouth daily.  90 tablet  4  . oseltamivir (TAMIFLU) 75 MG capsule Take 1  capsule (75 mg total) by mouth 2 (two) times daily.  10 capsule  0    Allergies  Allergen Reactions  . Hydrocodone   . Levaquin   . Sudafed (Pseudoephedrine Hcl)   . Sulfur     Patient Active Problem List  Diagnoses  . Benign hypertensive heart disease without heart failure  . Hypercholesterolemia  . Hypothyroidism  . History of melanoma  . Hx of thyroid cancer  . Chest pain, atypical  . Dizziness  . Cough    History  Smoking status  . Never Smoker   Smokeless tobacco  . Not on file    History  Alcohol Use No    Family History  Problem Relation Age of Onset  . Stroke Mother   . Asthma Father   . Diabetes Father   . Cancer Father   . Cancer Sister   . Diabetes Sister   . Cancer Brother     Review of Systems: The patient denies any heat or cold intolerance.  No weight gain  or weight loss.  The patient denies headaches or blurry vision.  There is no cough or sputum production.  The patient denies dizziness.  There is no hematuria or hematochezia.  The patient denies any muscle aches or arthritis.  The patient denies any rash.  The patient denies frequent falling or instability.  There is no history of depression or anxiety.  All other systems were reviewed and are negative.   Physical Exam: Filed Vitals:   09/21/11 1501  BP: 132/66  Pulse: 73  Resp: 18   general appearance reveals a miserable coughing woman who appears to be uncomfortable.  Her skin is warm suggesting low-grade fever.Pupils equal and reactive.   Extraocular Movements are full.  There is no scleral icterus.  The mouth and pharynx are normal.  The neck is supple.  The carotids reveal no bruits.  The jugular venous pressure is normal.  The thyroid is not enlarged.  There is no lymphadenopathy.  The chest reveals no rales or rhonchi.The precordium is quiet.  The first heart sound is normal.  The second heart sound is physiologically split.  There is no murmur gallop rub or click.  There is no  abnormal lift or heave.  The abdomen is soft and nontender. Bowel sounds are normal. The liver and spleen are not enlarged. There Are no abdominal masses. There are no bruits.  All extremities show no phlebitis or edema.Strength is normal and symmetrical in all extremities.  There is no lateralizing weakness.  There are no sensory deficits.  The skin is warm and dry.  There is no rash.     Assessment / Plan: The patient was given prescription for Tamiflu 75 mg one twice a day.  She is to go home and rest.  We will see her back at her regular visit or sooner when necessary.  She will use Tylenol for myalgias sent she cannot take any of the nonsteroidal agents because of intolerances.

## 2011-09-21 NOTE — Assessment & Plan Note (Signed)
The abrupt onset of aching and low grade fever and cough and coryza and pain behind her eyes is suggestive of influenza.  We are not able to test for influenza at this office.  I explained that to her.  I offered her the opportunity to go to her primary care doctor's office this afternoon but she feels so bad that she doesn't want to go to another doctor's office today.  It is likely that she does have influenza therefore we will empirically treat her with Tamiflu 75 mg twice a day for 5 days

## 2011-10-01 ENCOUNTER — Telehealth: Payer: Self-pay | Admitting: Cardiology

## 2011-10-01 NOTE — Telephone Encounter (Signed)
Spoke with patient and will order next week, does not want until next year

## 2011-10-01 NOTE — Telephone Encounter (Signed)
Order HCTZ also

## 2011-10-01 NOTE — Telephone Encounter (Signed)
New Problem:    Patient called in needing a prescription for a 90 day refill of her amLODipine (NORVASC) 5 MG tablet from [facility] (Phone# [phone]) after the first of the year. Please call when order has been placed.   Patient also wanted to let Juliette Alcide know that she did not have the flu the last time she visited the practice.  She went and was tested by her primary care doctor and it was just a really bad virus that turned into Bronchitis.

## 2011-10-04 ENCOUNTER — Ambulatory Visit: Payer: Medicare Other | Admitting: *Deleted

## 2011-10-04 ENCOUNTER — Telehealth: Payer: Self-pay | Admitting: Oncology

## 2011-10-04 NOTE — Telephone Encounter (Signed)
pt called and scheduled appt for [phone]

## 2011-10-06 ENCOUNTER — Other Ambulatory Visit: Payer: Self-pay | Admitting: Cardiology

## 2011-10-06 ENCOUNTER — Other Ambulatory Visit: Payer: Self-pay | Admitting: *Deleted

## 2011-10-06 DIAGNOSIS — R05 Cough: Secondary | ICD-10-CM | POA: Diagnosis not present

## 2011-10-06 DIAGNOSIS — R059 Cough, unspecified: Secondary | ICD-10-CM | POA: Diagnosis not present

## 2011-10-06 DIAGNOSIS — I119 Hypertensive heart disease without heart failure: Secondary | ICD-10-CM

## 2011-10-06 MED ORDER — AMLODIPINE BESYLATE 5 MG PO TABS: 5.0000 mg | ORAL_TABLET | Freq: Every day | ORAL | Status: DC

## 2011-10-06 MED ORDER — HYDROCHLOROTHIAZIDE 25 MG PO TABS: 25.0000 mg | ORAL_TABLET | Freq: Every day | ORAL | Status: DC

## 2011-10-06 NOTE — Telephone Encounter (Signed)
Patient requested refills, sent to pharmacy  

## 2011-10-06 NOTE — Telephone Encounter (Signed)
Fu call Pt called about amlodipine to order [facility]. She only has 8 pills left

## 2011-10-06 NOTE — Telephone Encounter (Signed)
Will refill medication today

## 2011-10-06 NOTE — Telephone Encounter (Signed)
Left message Rx will be sent in

## 2011-10-08 DIAGNOSIS — J45909 Unspecified asthma, uncomplicated: Secondary | ICD-10-CM | POA: Diagnosis not present

## 2011-10-12 DIAGNOSIS — E039 Hypothyroidism, unspecified: Secondary | ICD-10-CM | POA: Diagnosis not present

## 2011-10-12 DIAGNOSIS — C73 Malignant neoplasm of thyroid gland: Secondary | ICD-10-CM | POA: Diagnosis not present

## 2011-10-18 DIAGNOSIS — D485 Neoplasm of uncertain behavior of skin: Secondary | ICD-10-CM | POA: Diagnosis not present

## 2011-10-18 DIAGNOSIS — Z85820 Personal history of malignant melanoma of skin: Secondary | ICD-10-CM | POA: Diagnosis not present

## 2011-10-18 DIAGNOSIS — C44319 Basal cell carcinoma of skin of other parts of face: Secondary | ICD-10-CM | POA: Diagnosis not present

## 2011-10-18 DIAGNOSIS — L723 Sebaceous cyst: Secondary | ICD-10-CM | POA: Diagnosis not present

## 2011-10-18 DIAGNOSIS — Z85828 Personal history of other malignant neoplasm of skin: Secondary | ICD-10-CM | POA: Diagnosis not present

## 2011-10-21 ENCOUNTER — Ambulatory Visit (INDEPENDENT_AMBULATORY_CARE_PROVIDER_SITE_OTHER): Payer: Medicare Other | Admitting: Internal Medicine

## 2011-10-21 ENCOUNTER — Encounter: Payer: Self-pay | Admitting: Internal Medicine

## 2011-10-21 ENCOUNTER — Ambulatory Visit (INDEPENDENT_AMBULATORY_CARE_PROVIDER_SITE_OTHER)
Admission: RE | Admit: 2011-10-21 | Discharge: 2011-10-21 | Disposition: A | Discharge status: Home / Self Care | Payer: Medicare Other | Source: Ambulatory Visit | Attending: Internal Medicine | Admitting: Internal Medicine

## 2011-10-21 VITALS — BP 128/78 | HR 82 | Temp 98.0°F | Ht 61.75 in | Wt 144.0 lb

## 2011-10-21 DIAGNOSIS — R059 Cough, unspecified: Secondary | ICD-10-CM

## 2011-10-21 DIAGNOSIS — Z85820 Personal history of malignant melanoma of skin: Secondary | ICD-10-CM | POA: Diagnosis not present

## 2011-10-21 DIAGNOSIS — D71 Functional disorders of polymorphonuclear neutrophils: Secondary | ICD-10-CM | POA: Diagnosis not present

## 2011-10-21 DIAGNOSIS — R05 Cough: Secondary | ICD-10-CM

## 2011-10-21 MED ORDER — FAMOTIDINE 20 MG PO TABS: ORAL_TABLET | ORAL | Status: DC

## 2011-10-21 MED ORDER — PREDNISONE (PAK) 10 MG PO TABS: ORAL_TABLET | ORAL | Status: AC

## 2011-10-21 MED ORDER — TRAMADOL HCL 50 MG PO TABS: ORAL_TABLET | ORAL | Status: AC

## 2011-10-21 NOTE — Patient Instructions (Addendum)
Please remember to go to the  x-ray department downstairs for your tests - we will call you with the results when they are available.    Prednisone 10 mg take  4 each am x 2 days,   2 each am x 2 days,  1 each am x2days and stop  Stop advair Only use  ventolin to help your breatthing, not your cough  The key to effective treatment for your cough is eliminating the non-stop cycle of cough you're stuck in long enough to let your airway heal completely and then see if there is anything still making you cough once you stop the cough suppression, but this should take no more than 5 days to figuure out  First take delsym two tsp every 12 hours and supplement if needed with  tramadol 50 mg up to 2 every 4 hours to suppress the urge to cough. Swallowing water or using ice chips/non mint and menthol containing candies (such as lifesavers or sugarless jolly ranchers) are also effective.  You should rest your voice and avoid activities that you know make you cough.  Once you have eliminated the cough for 3 straight days try reducing the tramadol first,  then the delsym as tolerated.    Try prilosec 20mg   Take 30-60 min before first meal of the day and Pepcid 20 mg one bedtime until cough is completely gone for at least a week without the need for cough suppression  I think of reflux for chronic cough like I do oxygen for fire (doesn't cause the fire but once you get the oxygen suppressed it usually goes away regardless of the exact cause).  GERD (REFLUX)  is an extremely common cause of respiratory symptoms, many times with no significant heartburn at all.    It can be treated with medication, but also with lifestyle changes including avoidance of late meals, excessive alcohol, smoking cessation, and avoid fatty foods, chocolate, peppermint, colas, red wine, and acidic juices such as orange juice.  NO MINT OR MENTHOL PRODUCTS SO NO COUGH DROPS  USE SUGARLESS CANDY INSTEAD (jolley ranchers or Stover's)  NO  OIL BASED VITAMINS - use powdered substitutes. (NO FISH OIL)  Please schedule a follow up office visit in 2 weeks, sooner if needed

## 2011-10-21 NOTE — Progress Notes (Signed)
  Subjective:    Patient ID: [name], female    DOB: Feb 28, 1941, 71 y.o.   MRN: [id number]  HPI  Brief patient profile:  8 yowf never smoker with ? H/o asthma/ allergies to dust mold relatively well controlled since 2010 on advair and cough meds as needed but no maintenance rx previously then  persistent cough since Sep 21 2011 and referred by Dr Clarene Duke 10/21/2011 for pulmonary evaluation.  10/21/2011 1st pulmonary ov emr era abupt onset 09/21/11 daily  cough esp in am and while sleeping > was yellow mucus now white assoc with sensation of excess pnds.  Started advair >  no benefit. Sob mostly with coughing but some better p ventolin.  No lateralizing pleuritic cp, no ex cp.   Also denies any obvious fluctuation of symptoms with weather or environmental changes or other aggravating or alleviating factors except as outlined above   Prev cough w/u in 2002 in pulmonary clinic by [facility] with dx of ? Asthma/ ? gerd with uacs.   Review of Systems  Constitutional: Positive for chills. Negative for fever and unexpected weight change.  HENT: Positive for ear pain, congestion, rhinorrhea, sneezing, postnasal drip and sinus pressure. Negative for nosebleeds, sore throat, trouble swallowing, dental problem and voice change.   Eyes: Negative for visual disturbance.  Respiratory: Positive for cough and shortness of breath. Negative for choking.   Cardiovascular: Positive for chest pain. Negative for leg swelling.  Gastrointestinal: Negative for vomiting, abdominal pain and diarrhea.  Genitourinary: Negative for difficulty urinating.  Musculoskeletal: Negative for arthralgias.  Skin: Negative for rash.  Neurological: Negative for tremors, syncope and headaches.  Hematological: Does not bruise/bleed easily.       Objective:   Physical Exam  Very anxious amb wf who  ailed to answer a single question asked in a straightforward manner, tending to go off on tangents or answer questions with  ambiguous medical terms or diagnoses and seemed perplexed  When asked questions more than once for clarification.  Wt  144 10/21/2011  HEENT: nl dentition, turbinates, and orophanx. Nl external ear canals without cough reflex   NECK :  without JVD/Nodes/TM/ nl carotid upstrokes bilaterally   LUNGS: no acc muscle use, clear to A and P bilaterally without cough on insp or exp maneuvers   CV:  RRR  no s3 or murmur or increase in P2, no edema   ABD:  soft and nontender with nl excursion in the supine position. No bruits or organomegaly, bowel sounds nl  MS:  warm without deformities, calf tenderness, cyanosis or clubbing  SKIN: warm and dry without lesions    NEURO:  alert, approp, no deficits   CXR  10/21/2011 :  1. No specific cause for the patient's cough is identified.  2. Old granulomatous disease.  3. Upper thoracic spondylosis      Assessment & Plan:

## 2011-10-21 NOTE — Assessment & Plan Note (Signed)
DDX of  difficult airways managment all start with A and  include Adherence, Ace Inhibitors, Acid Reflux, Active Sinus Disease, Alpha 1 Antitripsin deficiency, Anxiety masquerading as Airways dz,  ABPA,  allergy(esp in young), Aspiration (esp in elderly), Adverse effects of DPI,  Active smokers, plus two Bs  = Bronchiectasis and Beta blocker use..and one C= CHF   ? All acid reflux > max rx and diet  ? Adverse effect of advair/ dpi > d/c The proper method of use, as well as anticipated side effects, of this metered-dose inhaler are discussed and demonstrated to the patient. Only improved to 25% with coaching so doubt she really has much asthma but ok to use prn  ? Anxiety > dx of exclusion  Of the three most common causes of chronic cough, only one (GERD)  can actually cause the other two (asthma and post nasal drip syndrome)  and perpetuate the cylce of cough inducing airway trauma, inflammation, heightened sensitivity to reflux which is prompted by the cough itself via a cyclical mechanism.    This may partially respond to steroids and look like asthma and post nasal drainage but never erradicated completely unless the cough and the secondary reflux are eliminated, preferably both at the same time.  While not intuitively obvious, many patients with chronic low grade reflux do not cough until there is a secondary insult that disturbs the protective epithelial barrier and exposes sensitive nerve endings.  This can be viral or direct physical injury such as with an endotracheal tube.   The point is that once this occurs, it is difficult to eliminate using anything but a maximally effective acid suppression regimen at least in the short run, accompanied by an appropriate diet to address non acid GERD.   See instructions for specific recommendations which were reviewed directly with the patient who was given a copy with highlighter outlining the key components.

## 2011-10-25 ENCOUNTER — Telehealth: Payer: Self-pay | Admitting: Internal Medicine

## 2011-10-25 NOTE — Progress Notes (Signed)
Quick Note:  Spoke with pt and notified of results per Dr. Wert. Pt verbalized understanding and denied any questions.  ______ 

## 2011-10-25 NOTE — Telephone Encounter (Signed)
Pt aware of results. Dawn Kiper, CMA  

## 2011-10-27 ENCOUNTER — Ambulatory Visit (INDEPENDENT_AMBULATORY_CARE_PROVIDER_SITE_OTHER): Payer: Medicare Other | Admitting: Cardiology

## 2011-10-27 ENCOUNTER — Encounter: Payer: Self-pay | Admitting: Cardiology

## 2011-10-27 VITALS — BP 130/80 | HR 80 | Ht 61.0 in | Wt 143.0 lb

## 2011-10-27 DIAGNOSIS — C439 Malignant melanoma of skin, unspecified: Secondary | ICD-10-CM

## 2011-10-27 DIAGNOSIS — R059 Cough, unspecified: Secondary | ICD-10-CM | POA: Diagnosis not present

## 2011-10-27 DIAGNOSIS — Z85820 Personal history of malignant melanoma of skin: Secondary | ICD-10-CM

## 2011-10-27 DIAGNOSIS — I119 Hypertensive heart disease without heart failure: Secondary | ICD-10-CM

## 2011-10-27 DIAGNOSIS — R05 Cough: Secondary | ICD-10-CM

## 2011-10-27 NOTE — Patient Instructions (Signed)
Your physician recommends that you continue on your current medications as directed. Please refer to the Current Medication list given to you today.  Your physician wants you to follow-up in: 3 months. You will receive a reminder letter in the mail two months in advance. If you don't receive a letter, please call our office to schedule the follow-up appointment.  

## 2011-10-27 NOTE — Assessment & Plan Note (Signed)
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  She will be undergoing delicate surgery for this at [facility] February 18.

## 2011-10-27 NOTE — Assessment & Plan Note (Signed)
The patient has not been having any dizzy spells or syncope or palpitations.  No recent exertional chest pain.

## 2011-10-27 NOTE — Progress Notes (Signed)
[name] Date of Birth:  1941-08-30 [facility] [address] [place], [place]  [address] [phone]         Fax   [phone]  History of Present Illness: This pleasant 71 year old woman is seen for a scheduled followup office visit.  She has a past history of essential hypertension.  She also has a past history of upper cholesterolemia and a history of melanoma as well as a history of thyroid cancer.  She's had atypical chest pain and she had a nuclear stress test in 2006 which was negative for ischemia.  Recently she had a bad cough and had several rounds of antibiotics without getting better.  She finally saw Dr. Sherene Sires who adjusted her medication and gave her a round of steroids and her cough is finally eased up.  The patient has recently been told that her melanoma has recurred on her nose.  It started in her eye.  Current Outpatient Prescriptions  Medication Sig Dispense Refill  . Acetaminophen (TYLENOL 8 HOUR PO) Take by mouth as needed.        [name] albuterol (VENTOLIN HFA) 108 (90 BASE) MCG/ACT inhaler Inhale 2 puffs into the lungs every 6 (six) hours as needed.      . ALPRAZolam (XANAX) 0.5 MG tablet Take 0.5 mg by mouth daily as needed.      [name] amLODipine (NORVASC) 5 MG tablet Take 1 tablet (5 mg total) by mouth daily.  90 tablet  3  . aspirin 81 MG tablet Take 81 mg by mouth daily.        . calcium citrate-vitamin D (CITRACAL+D) 315-200 MG-UNIT per tablet Take 1 tablet by mouth daily.       [name] dextromethorphan-guaiFENesin (MUCINEX DM) 30-600 MG per 12 hr tablet Take 1 tablet by mouth as needed.        . estazolam (PROSOM) 1 MG tablet Take 0.5 mg by mouth at bedtime.      . famotidine (PEPCID) 20 MG tablet One at bedtime      . fexofenadine (ALLEGRA) 60 MG tablet Take 60 mg by mouth as needed.        . fluticasone (FLONASE) 50 MCG/ACT nasal spray Place 2 sprays into the nose daily.      . hydrochlorothiazide (HYDRODIURIL) 25 MG tablet Take 1 tablet (25 mg  total) by mouth daily.  90 tablet  3  . levothyroxine (SYNTHROID, LEVOTHROID) 125 MCG tablet Take 125 mcg by mouth daily.        . predniSONE (STERAPRED UNI-PAK) 10 MG tablet Prednisone 10 mg take  4 each am x 2 days,   2 each am x 2 days,  1 each am x2days and stop  14 tablet  0  . Probiotic Product (PROBIOTIC PO) Take by mouth daily.        . simvastatin (ZOCOR) 20 MG tablet Take 1 tablet (20 mg total) by mouth every evening.  90 tablet  3  . telmisartan (MICARDIS) 40 MG tablet Take 1 tablet (40 mg total) by mouth daily.  90 tablet  4  . traMADol (ULTRAM) 50 MG tablet 1-2 every 4 hours as needed for cough or pain  40 tablet  0    Allergies  Allergen Reactions  . Hydrocodone   . Levaquin   . Sudafed (Pseudoephedrine Hcl)   . Sulfur   . Zoloft     Patient Active Problem List  Diagnoses  . Benign hypertensive heart disease without heart failure  .  Hypercholesterolemia  . Hypothyroidism  . History of melanoma  . Hx of thyroid cancer  . Chest pain, atypical  . Dizziness  . Cough    History  Smoking status  . Never Smoker   Smokeless tobacco  . Never Used    History  Alcohol Use No    Family History  Problem Relation Age of Onset  . Stroke Mother   . Asthma Father   . Diabetes Father   . Cancer Father   . Cancer Sister   . Diabetes Sister   . Cancer Brother     Review of Systems: Constitutional: no fever chills diaphoresis or fatigue or change in weight.  Head and neck: no hearing loss, no epistaxis, no photophobia or visual disturbance. Respiratory: No cough, shortness of breath or wheezing. Cardiovascular: No chest pain peripheral edema, palpitations. Gastrointestinal: No abdominal distention, no abdominal pain, no change in bowel habits hematochezia or melena. Genitourinary: No dysuria, no frequency, no urgency, no nocturia. Musculoskeletal:No arthralgias, no back pain, no gait disturbance or myalgias. Neurological: No dizziness, no headaches, no numbness,  no seizures, no syncope, no weakness, no tremors. Hematologic: No lymphadenopathy, no easy bruising. Psychiatric: No confusion, no hallucinations, no sleep disturbance.    Physical Exam: Filed Vitals:   10/27/11 0924  BP: 130/80  Pulse: 80   The general appearance reveals a well-developed well-nourished woman in no distress.The head and neck exam reveals pupils equal and reactive.  Extraocular movements are full.  There is no scleral icterus.  The mouth and pharynx are normal.  The neck is supple.  The carotids reveal no bruits.  The jugular venous pressure is normal.  The  thyroid is not enlarged.  There is no lymphadenopathy.  The chest is clear to percussion and auscultation.  There are no rales or rhonchi.  Expansion of the chest is symmetrical.  The precordium is quiet.  The first heart sound is normal.  The second heart sound is physiologically split.  There is no murmur gallop rub or click.  There is no abnormal lift or heave.  The abdomen is soft and nontender.  The bowel sounds are normal.  The liver and spleen are not enlarged.  There are no abdominal masses.  There are no abdominal bruits.  Extremities reveal good pedal pulses.  There is no phlebitis or edema.  There is no cyanosis or clubbing.  Strength is normal and symmetrical in all extremities.  There is no lateralizing weakness.  There are no sensory deficits.  The skin is warm and dry.  There is no rash.    Assessment / Plan: Continue same medication.  Recheck in 3 months for followup office visit CBC lipid panel hepatic function panel and basal metabolic panel.  He should also get a EKG that day.

## 2011-10-27 NOTE — Assessment & Plan Note (Signed)
Her cough is definitely improved.  She will continue to followup for her pulmonary problems with Dr. Sherene Sires.  She is in the process of changing primary care providers.

## 2011-10-28 DIAGNOSIS — E2839 Other primary ovarian failure: Secondary | ICD-10-CM | POA: Diagnosis not present

## 2011-10-29 ENCOUNTER — Telehealth: Payer: Self-pay | Admitting: *Deleted

## 2011-10-29 NOTE — Telephone Encounter (Signed)
patient called in on 10-29-2011 to change her appointment to 11-29-2011 at 2:00pm with dr.[email or web address]

## 2011-11-02 ENCOUNTER — Institutional Professional Consult (permissible substitution): Payer: Medicare Other | Admitting: Pulmonary Disease

## 2011-11-02 ENCOUNTER — Other Ambulatory Visit: Payer: Self-pay | Admitting: *Deleted

## 2011-11-02 ENCOUNTER — Encounter: Payer: Medicare Other | Attending: Nephrology | Admitting: Dietician

## 2011-11-02 DIAGNOSIS — I119 Hypertensive heart disease without heart failure: Secondary | ICD-10-CM | POA: Insufficient documentation

## 2011-11-02 DIAGNOSIS — F411 Generalized anxiety disorder: Secondary | ICD-10-CM | POA: Diagnosis not present

## 2011-11-02 DIAGNOSIS — Z713 Dietary counseling and surveillance: Secondary | ICD-10-CM | POA: Insufficient documentation

## 2011-11-02 DIAGNOSIS — E7800 Pure hypercholesterolemia, unspecified: Secondary | ICD-10-CM

## 2011-11-02 MED ORDER — TELMISARTAN 40 MG PO TABS: 40.0000 mg | ORAL_TABLET | Freq: Every day | ORAL | Status: DC

## 2011-11-02 NOTE — Telephone Encounter (Signed)
Patient requested at office visit for this to refilled this week.  Sent to pharmacy as requested

## 2011-11-02 NOTE — Patient Instructions (Addendum)
   Sodium:  2000 mg per day.  Breakfast: 500 mg of sodium  Lunch:500 mg  Dinner:500 mg   Morning Snack: 250 mg and Snack 250 mg.  Use minimal amount of lite salt when cooking or at the table.  Use garlic powder or onion powder or fresh onion or fresh garlic.   Avoid fruits and vegetables for residue problems.  Break the particles down with cooking.  Try to use your energy to prepare your foods from scratch.  Avoid the prepared.  Go to cook books and look for recipes for soups and plan to prepare the low-sodium and low roughage/fiber and freeze.  Library American Heart Association Low Sodium Automatic Data.  When out to eat, order a grilled or baked meat or fish without a sauce or added salt.  Have a small serving of sweet potato or [place] potato or 1/2 cup of rice and a fresh green beans or fresh spinach, or broccoli cooked.  Low sodium/sal and cooked/canned (fruits) and limit the starch and the sugars.  Follow-up with me in 8-12 weeks.  Bring your blood pressure records.

## 2011-11-02 NOTE — Progress Notes (Signed)
  Medical Nutrition Therapy:  Appt start time: 1030 end time:  1200.  Assessment:  Primary concerns today: What to eat that will not have too much sodium, not cause my blood sugar to go up and not cause me to have colon problems or another colon infection.   MEDICATIONS: Multiple medications that were reviewed.  Hypertensive/CV medications: Macardis, Amlodipine, Hydrochlorothiazide, Macardis, Simvastatin, ASA 81 mg.  DIETARY INTAKE:  24-hr recall:  5-6:00-6:30 after the thyroid medication. Oatmeal, minute and regular (cook for 45 minutes) add water and milk (fat free and lactose free) about 1-1.5 cups. Puts some "If its butter in the mix, no salt, add some brown raw sugar.  5-6 days per week.  Other days will have the [employer] pancakes, 2 with the small pat of butter and a container of syrup. Bev:  Green tea with honey or plain or ginger tea, or instant coffee.  Use liquid creamer (1 tbsp) OR english muffin 1/2 multi-grain and the all natural juices jelly.    Snk (mid AM) :Will eat the other pancake with a little butter and the other syrup.  Other snack can be banana, cracker, and cheese.  L (11:30-1:30 PM): 8 ritz crackers with peanut butter, Actavia vanilla yogurt with 4 strawberries and 1/2 of a banana.  Soup, tomato soup with can of milk (1can soup, 1 can milk) will use 1 can of the soup for the meal, will try not add crackers or a wedge of Laughing Cow cheese, 6-8 rice crackers.  OR 1/2 English muffin, lite mayo, cheese, and tomato across this and PB crackers. Bev: Gingeale regular  6 oz.  Or water.    Snk (mid PM): Yogurt Lite)with 1/2 banana. Cheeses, the soft spreadable and use with crackers.  Less salt potato chips and with the ginger snap cooks. *No chocolate, citrus, fish oil or fatty foods for the next few weeks to deal with the cough along with the mints. D (4:30-6:30 PM): Hamburger, (lean, mini) will use the bun, 1/2 bun (larger bun) lettuce , tomato and onion, tomato soup, 6-8 oz,    Corn bread mini muffin with butter, [place] apple with 1/2 tsp honey and water.  Snk (later PM): 1 tsp. Of honey and satisfy the craving.   Beverages: Water, 4 oz of juice (50/50 juice)  Recent physical activity: With her surgery last spring and with the recent episode with the bronchial asthma, she has had a decrease in her energy level and limited activity due to becoming SOB.  Estimated energy needs:Ht: 61 in.  WT: 143.3 lb  Adjusted WT:  125-130 lb (given her current diagnoses) will use 58 Kg. 1400-1500 calories 150-155 g carbohydrates 125-130 g protein 38-40 g fat  Progress Towards Goal(s):  In progress.    Intervention:  Nutrition Counseled regarding limiting use of prepared food products, label reading and limiting intake to 2000 mg per day.  Counseled regarding limiting concentrated sweets, and the portions of starchy carbohydrates.  Recommended having all foods cooked and to omit high fiber cooked foods.  Handouts given during visit include:  Sodium Flavoring Tips  American Dietetic Association "Hypertension (High Blood Pressure) Nutrition Therapy"  Novo Nordisk Carb Counting Guide  Monitoring/Evaluation:  Dietary intake, exercise,  and body weight in 6-8 weeks.

## 2011-11-03 ENCOUNTER — Encounter: Payer: Self-pay | Admitting: Dietician

## 2011-11-04 ENCOUNTER — Ambulatory Visit (INDEPENDENT_AMBULATORY_CARE_PROVIDER_SITE_OTHER): Payer: Medicare Other | Admitting: Adult Health

## 2011-11-04 ENCOUNTER — Encounter: Payer: Self-pay | Admitting: Adult Health

## 2011-11-04 DIAGNOSIS — R059 Cough, unspecified: Secondary | ICD-10-CM | POA: Diagnosis not present

## 2011-11-04 DIAGNOSIS — R05 Cough: Secondary | ICD-10-CM

## 2011-11-04 NOTE — Patient Instructions (Addendum)
Use Delsym 2 tsp Twice daily  Until cough is gone.  May use Tramadol 50mg  in addition to help with cough -take 1 every 4 hr As needed   May use Zyrtec in place of Allegra daily for drainage.  Restart Prilosec daily before meal  Restart Pepcid 20mg  At bedtime   follow up Dr. Sherene Sires  In 6 weeks and As needed

## 2011-11-04 NOTE — Progress Notes (Signed)
  Subjective:    Patient ID: [name], female    DOB: 1941-03-10, 71 y.o.   MRN: [id number]  HPI  Brief patient profile:  38 yowf never smoker with ? H/o asthma/ allergies to dust mold relatively well controlled since 2010 on advair and cough meds as needed but no maintenance rx previously then  persistent cough since Sep 21 2011 and referred by Dr Clarene Duke 10/21/2011 for pulmonary evaluation.  10/21/2011 1st pulmonary ov emr era abupt onset 09/21/11 daily  cough esp in am and while sleeping > was yellow mucus now white assoc with sensation of excess pnds.  Started advair >  no benefit. Sob mostly with coughing but some better p ventolin.  No lateralizing pleuritic cp, no ex cp. Prev cough w/u in 2002 in pulmonary clinic by [facility] with dx of ? Asthma/ ? gerd with uacs. >>tx w/ cough suprression w/ delsym/tramadol , steroid taper, and PPI/Pepcid combo.    11/04/2011 Follow up  She returns for follow up . Last ov started on cough suppression regimen with GERD prevention. CXR with no acute process. Cough is better but not completely gone.  Stopped her cough meds and PPI 2 days ago.  She has a lot of stress as she has upcoming Nose surgery for melanoma.  No discolored mucus or fever.  No chest pain or edema.     Review of Systems  Constitutional:   No  weight loss, night sweats,  Fevers, chills, fatigue, or  lassitude.  HEENT:   No headaches,  Difficulty swallowing,  Tooth/dental problems, or  Sore throat,                No sneezing, itching, ear ache, nasal congestion, post nasal drip,   CV:  No chest pain,  Orthopnea, PND, swelling in lower extremities, anasarca, dizziness, palpitations, syncope.   GI  No heartburn, indigestion, abdominal pain, nausea, vomiting, diarrhea, change in bowel habits, loss of appetite, bloody stools.   Resp:    No coughing up of blood.  No change in color of mucus.  No wheezing.  No chest wall deformity  Skin: no rash    GU: no dysuria, change in  color of urine, no urgency or frequency.  No flank pain, no hematuria   MS:  No joint pain or swelling.  No decreased range of motion.  No back pain.  Psych:  No change in mood or affect. No depression or anxiety.  No memory loss.          Objective:   Physical Exam   Wt  144 10/21/2011>144 11/04/2011   HEENT: nl dentition, turbinates, and orophanx. Nl external ear canals without cough reflex   NECK :  without JVD/Nodes/TM/ nl carotid upstrokes bilaterally   LUNGS: no acc muscle use, clear to A and P bilaterally without cough on insp or exp maneuvers   CV:  RRR  no s3 or murmur or increase in P2, no edema   ABD:  soft and nontender with nl excursion in the supine position. No bruits or organomegaly, bowel sounds nl  MS:  warm without deformities, calf tenderness, cyanosis or clubbing  SKIN: warm and dry without lesions    NEURO:  alert, approp, no deficits   CXR  10/21/2011 :  1. No specific cause for the patient's cough is identified.  2. Old granulomatous disease.  3. Upper thoracic spondylosis      Assessment & Plan:

## 2011-11-04 NOTE — Assessment & Plan Note (Addendum)
Improving on regimen aimed at GERD and cough prevention   Plan:  Use Delsym 2 tsp Twice daily  Until cough is gone.  May use Tramadol 50mg  in addition to help with cough -take 1 every 4 hr As needed   May use Zyrtec in place of Allegra daily for drainage.  Restart Prilosec daily before meal  Restart Pepcid 20mg  At bedtime   follow up Dr. Sherene Sires  In 6 weeks and As needed

## 2011-11-10 ENCOUNTER — Telehealth: Payer: Self-pay | Admitting: Adult Health

## 2011-11-10 NOTE — Telephone Encounter (Signed)
[id number] x1 

## 2011-11-10 NOTE — Telephone Encounter (Signed)
I spoke with pt and she states she has been sick since 09/19/11. She came in and saw TP on 11/04/11 and was told that it could take a couple weeks to get back to feeling normal. She received an offer for a free condo in [place] for a week and is wanting to know if MW feels like she will be fine to go. She doesn't want her asthma to act up. She states she will stay in the house and not go out much. When we call back and if she doesn't answer then okay to leave detailed message on VM.  Please advise Dr. Sherene Sires, thanks

## 2011-11-10 NOTE — Telephone Encounter (Signed)
Patient returning call.

## 2011-11-10 NOTE — Telephone Encounter (Signed)
I spoke with pt and was fine coming in for an OV. Pt is scheduled to see TP since MW is booked up. Nothing further was needed

## 2011-11-10 NOTE — Telephone Encounter (Signed)
Needs to come in as a work in before trip to [place] if at all possible because should be turning the corner by now

## 2011-11-11 ENCOUNTER — Encounter: Payer: Self-pay | Admitting: Adult Health

## 2011-11-11 ENCOUNTER — Other Ambulatory Visit (HOSPITAL_BASED_OUTPATIENT_CLINIC_OR_DEPARTMENT_OTHER): Payer: Medicare Other | Admitting: Lab

## 2011-11-11 ENCOUNTER — Ambulatory Visit (INDEPENDENT_AMBULATORY_CARE_PROVIDER_SITE_OTHER): Payer: Medicare Other | Admitting: Adult Health

## 2011-11-11 VITALS — BP 108/62 | HR 75 | Temp 97.0°F | Ht 61.0 in | Wt 144.6 lb

## 2011-11-11 DIAGNOSIS — R29898 Other symptoms and signs involving the musculoskeletal system: Secondary | ICD-10-CM | POA: Diagnosis not present

## 2011-11-11 DIAGNOSIS — R059 Cough, unspecified: Secondary | ICD-10-CM

## 2011-11-11 DIAGNOSIS — D472 Monoclonal gammopathy: Secondary | ICD-10-CM

## 2011-11-11 DIAGNOSIS — Z85840 Personal history of malignant neoplasm of eye: Secondary | ICD-10-CM | POA: Diagnosis not present

## 2011-11-11 DIAGNOSIS — R05 Cough: Secondary | ICD-10-CM

## 2011-11-11 DIAGNOSIS — Z85850 Personal history of malignant neoplasm of thyroid: Secondary | ICD-10-CM | POA: Diagnosis not present

## 2011-11-11 LAB — CBC WITH DIFFERENTIAL/PLATELET
BASO%: 0.5 % (ref 0.0–2.0)
Basophils Absolute: 0 10*3/uL (ref 0.0–0.1)
EOS%: 2.3 % (ref 0.0–7.0)
Eosinophils Absolute: 0.1 10*3/uL (ref 0.0–0.5)
HCT: 38.1 % (ref 34.8–46.6)
HGB: 13 g/dL (ref 11.6–15.9)
LYMPH%: 37.3 % (ref 14.0–49.7)
MCH: 29.5 pg (ref 25.1–34.0)
MCHC: 34.1 g/dL (ref 31.5–36.0)
MCV: 86.4 fL (ref 79.5–101.0)
MONO#: 0.5 10*3/uL (ref 0.1–0.9)
MONO%: 8.9 % (ref 0.0–14.0)
NEUT#: 2.9 10*3/uL (ref 1.5–6.5)
NEUT%: 51 % (ref 38.4–76.8)
Platelets: 278 10*3/uL (ref 145–400)
RBC: 4.41 10*6/uL (ref 3.70–5.45)
RDW: 13 % (ref 11.2–14.5)
WBC: 5.6 10*3/uL (ref 3.9–10.3)
lymph#: 2.1 10*3/uL (ref 0.9–3.3)

## 2011-11-11 NOTE — Progress Notes (Signed)
Subjective:    Patient ID: [name], female    DOB: June 23, 1941, 71 y.o.   MRN: [id number]  HPI  Brief patient profile:  26 yowf never smoker with ? H/o asthma/ allergies to dust mold relatively well controlled since 2010 on advair and cough meds as needed but no maintenance rx previously then  persistent cough since Sep 21 2011 and referred by Dr Clarene Duke 10/21/2011 for pulmonary evaluation.  10/21/2011 1st pulmonary ov emr era abupt onset 09/21/11 daily  cough esp in am and while sleeping > was yellow mucus now white assoc with sensation of excess pnds.  Started advair >  no benefit. Sob mostly with coughing but some better p ventolin.  No lateralizing pleuritic cp, no ex cp. Prev cough w/u in 2002 in pulmonary clinic by Gonzalez/Young with dx of ? Asthma/ ? gerd with uacs. >>tx w/ cough suprression w/ delsym/tramadol , steroid taper, and PPI/Pepcid combo.    11/04/2011 Follow up  She returns for follow up . Last ov started on cough suppression regimen with GERD prevention. CXR with no acute process. Cough is better but not completely gone.  Stopped her cough meds and PPI 2 days ago.  She has a lot of stress as she has upcoming Nose surgery for melanoma.  No discolored mucus or fever.  No chest pain or edema.  >>cont on PPI /pepcid , cough suppression rx   11/11/2011 Follow up  Pt is here to have check up prior to leaving on vacation . She is still doing well with less cough but developed sore throat yesterday. Had switched from allegra to zyrtec however was unable to tolerate  Zyrtec . She is now back on allegra. No increased cough, wheezing or fever.  She is leaving for [facility] in 3 days -wanted to be checked before going to make sure she is okay to go.      Review of Systems  Constitutional:   No  weight loss, night sweats,  Fevers, chills, fatigue, or  lassitude.  HEENT:   No headaches,  Difficulty swallowing,  Tooth/dental problems, or  + Sore throat,                No  sneezing, itching, ear ache, nasal congestion, + post nasal drip,   CV:  No chest pain,  Orthopnea, PND, swelling in lower extremities, anasarca, dizziness, palpitations, syncope.   GI  No heartburn, indigestion, abdominal pain, nausea, vomiting, diarrhea, change in bowel habits, loss of appetite, bloody stools.   Resp:    No coughing up of blood.    No chest wall deformity  Skin: no rash    GU: no dysuria, change in color of urine, no urgency or frequency.  No flank pain, no hematuria   MS:  No joint pain or swelling.  No decreased range of motion.  No back pain.  Psych:  No change in mood or affect. No depression or anxiety.  No memory loss.          Objective:   Physical Exam   Wt  144 10/21/2011>144 11/04/2011 >144 11/11/2011   HEENT: nl dentition, turbinates, and orophanx. Nl external ear canals without cough reflex   NECK :  without JVD/Nodes/TM/ nl carotid upstrokes bilaterally   LUNGS: no acc muscle use, clear to A and P bilaterally without cough on insp or exp maneuvers   CV:  RRR  no s3 or murmur or increase in P2, no edema   ABD:  soft and  nontender with nl excursion in the supine position. No bruits or organomegaly, bowel sounds nl  MS:  warm without deformities, calf tenderness, cyanosis or clubbing  SKIN: warm and dry without lesions    NEURO:  alert, approp, no deficits   CXR  10/21/2011 :  1. No specific cause for the patient's cough is identified.  2. Old granulomatous disease.  3. Upper thoracic spondylosis      Assessment & Plan:

## 2011-11-11 NOTE — Assessment & Plan Note (Signed)
Improving on current regimen.  Discussed stratigies for rhinitis prevention  She is to continue on GERD prevetion regimen with cough suppression rx follow up 6 weeks and As needed

## 2011-11-11 NOTE — Patient Instructions (Addendum)
Use Delsym 2 tsp Twice daily  Until cough is gone.  May use Tramadol 50mg  in addition to help with cough -take 1 every 4 hr As needed   May use Allegra daily for drainage.  Take  Prilosec daily before meal  Take  Pepcid 20mg  At bedtime   follow up Dr. Sherene Sires  In 6 weeks and As needed

## 2011-11-12 ENCOUNTER — Encounter (HOSPITAL_COMMUNITY): Payer: Self-pay | Admitting: Emergency Medicine

## 2011-11-12 ENCOUNTER — Encounter: Payer: Self-pay | Admitting: *Deleted

## 2011-11-12 ENCOUNTER — Telehealth: Payer: Self-pay | Admitting: *Deleted

## 2011-11-12 ENCOUNTER — Other Ambulatory Visit: Payer: Self-pay | Admitting: *Deleted

## 2011-11-12 ENCOUNTER — Emergency Department (INDEPENDENT_AMBULATORY_CARE_PROVIDER_SITE_OTHER)
Admission: EM | Admit: 2011-11-12 | Discharge: 2011-11-12 | Disposition: A | Discharge status: Home / Self Care | Payer: Medicare Other | Source: Home / Self Care | Attending: Family Medicine | Admitting: Family Medicine

## 2011-11-12 DIAGNOSIS — K432 Incisional hernia without obstruction or gangrene: Secondary | ICD-10-CM | POA: Diagnosis not present

## 2011-11-12 DIAGNOSIS — I119 Hypertensive heart disease without heart failure: Secondary | ICD-10-CM

## 2011-11-12 DIAGNOSIS — R109 Unspecified abdominal pain: Secondary | ICD-10-CM

## 2011-11-12 DIAGNOSIS — E7800 Pure hypercholesterolemia, unspecified: Secondary | ICD-10-CM

## 2011-11-12 DIAGNOSIS — E039 Hypothyroidism, unspecified: Secondary | ICD-10-CM

## 2011-11-12 LAB — KAPPA/LAMBDA LIGHT CHAINS: Kappa free light chain: 0.03 mg/dL — ABNORMAL LOW (ref 0.33–1.94)

## 2011-11-12 LAB — COMPREHENSIVE METABOLIC PANEL
BUN: 11 mg/dL (ref 6–23)
CO2: 26 mEq/L (ref 19–32)
Calcium: 9.8 mg/dL (ref 8.4–10.5)
Chloride: 102 mEq/L (ref 96–112)
Creatinine, Ser: 0.74 mg/dL (ref 0.50–1.10)
Total Bilirubin: 0.8 mg/dL (ref 0.3–1.2)

## 2011-11-12 LAB — IGG, IGA, IGM
IgA: 24 mg/dL — ABNORMAL LOW (ref 69–380)
IgG (Immunoglobin G), Serum: 1340 mg/dL (ref 690–1700)
IgM, Serum: 20 mg/dL — ABNORMAL LOW (ref 52–322)

## 2011-11-12 NOTE — ED Notes (Signed)
PT HERE WITH RIGHT UPPER QUAD KNOT THAT PT FELT LAST NIGHT.PAIN WITH CERTAIN TURNING OR MOVEMENT.NO N/V/D.PT HAS EXTENSIVE MEDICAL HX CANCERS AND HERNIA.

## 2011-11-12 NOTE — Progress Notes (Signed)
Pt understands that she cannot be seen today due to the availability in our schedule. Pt reports that she will go to the "urgent care" today but would like to be put on the MD schedule for Monday 11/15/11. [employer] sent to schedulers for an appt with MD.

## 2011-11-12 NOTE — Telephone Encounter (Signed)
PT. HAS HAD DIFFICULTY "GETTING COMFORTABLE IN BED". SHE HAS HAD PAIN ON HER RIGHT SIDE UNDER HER BREAST. LAST NIGHT PT. NOTICE A "KNOT IN THE LIVER AREA THE SIZE OF A HEN EGG OR WALNUT". SHE IS GOING OUT OF TOWN FOR A WEEK TOMORROW AND WOULD LIKE TO SEE SOMEONE TODAY. THIS NOTE TO DR.MAGRINAT'S NURSE, VICTORIA MOORE,RN.

## 2011-11-12 NOTE — Telephone Encounter (Signed)
placed patient on dr.magrinat's schedule on 11-15-2011 per the desk nurse

## 2011-11-12 NOTE — ED Provider Notes (Addendum)
History     CSN: [id number]  Arrival date & time 11/12/11  1010   None     Chief Complaint  Patient presents with  . Abdominal Pain    (Consider location/radiation/quality/duration/timing/severity/associated sxs/prior treatment) Patient is a 71 y.o. female presenting with abdominal pain. The history is provided by the patient.  Abdominal Pain The primary symptoms of the illness include abdominal pain. The primary symptoms of the illness do not include fever, shortness of breath, nausea, vomiting, diarrhea or dysuria. The onset of the illness was gradual. The problem has not changed since onset. The patient states that she believes she is currently not pregnant. The patient has had a change in bowel habit. Symptoms associated with the illness do not include chills or back pain.    Past Medical History  Diagnosis Date  . 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 Cancer     THYROID, SKIN, NOSE  . History of blood clots     LEG  . Stroke   . Hernia   . MGUS (monoclonal gammopathy of unknown significance)   . Chronic kidney disease   . Hypertension   . Hyperlipidemia   . Thyroid disease   . Arthritis     Past Surgical History  Procedure Date  . Abdominal hysterectomy   . Rotator cuff repair 2004    RIGHT SHOULDER  . Hernia repair   . Leg surgery     VEIN & BLOOD CLOT  . Cardiovascular stress test 2006    NORMAL  . Transthoracic echocardiogram 2006    Family History  Problem Relation Age of Onset  . Stroke Mother   . Asthma Father   . Diabetes Father   . Cancer Father   . Cancer Sister   . Diabetes Sister   . Cancer Brother     History  Substance Use Topics  . Smoking status: Never Smoker   . Smokeless tobacco: Never Used  . Alcohol Use: No    OB History    Grav Para Term Preterm Abortions TAB SAB Ect Mult Living                  Review of Systems  Constitutional: Negative for fever and chills.  HENT: Negative.   Respiratory: Negative for  shortness of breath.   Gastrointestinal: Positive for abdominal pain. Negative for nausea, vomiting and diarrhea.  Genitourinary: Negative for dysuria and pelvic pain.  Musculoskeletal: Negative for back pain.    Allergies  Hydrocodone; Levaquin; Sudafed; Sulfur; and Zoloft  Home Medications   Current Outpatient Rx  Name Route Sig Dispense Refill  . TYLENOL 8 HOUR PO Oral Take by mouth as needed.      . ALBUTEROL SULFATE HFA 108 (90 BASE) MCG/ACT IN AERS Inhalation Inhale 2 puffs into the lungs every 6 (six) hours as needed.    . ALPRAZOLAM 0.5 MG PO TABS Oral Take 0.5 mg by mouth daily as needed.    [name] AMLODIPINE BESYLATE 5 MG PO TABS Oral Take 1 tablet (5 mg total) by mouth daily. 90 tablet 3  . ASPIRIN 81 MG PO TABS Oral Take 81 mg by mouth daily.      [name] CALCIUM CITRATE-VITAMIN D 315-200 MG-UNIT PO TABS Oral Take 1 tablet by mouth daily.     [name] FAMOTIDINE 20 MG PO TABS  One at bedtime    . FEXOFENADINE HCL 180 MG PO TABS  1/2 tablet daily    . FLUTICASONE  PROPIONATE 50 MCG/ACT NA SUSP Nasal Place 2 sprays into the nose daily as needed.     [name] HYDROCHLOROTHIAZIDE 25 MG PO TABS Oral Take 1 tablet (25 mg total) by mouth daily. 90 tablet 3  . LEVOTHYROXINE SODIUM 125 MCG PO TABS Oral Take 125 mcg by mouth daily.      [name] PROBIOTIC PO Oral Take by mouth daily.      [name] SIMVASTATIN 20 MG PO TABS Oral Take 1 tablet (20 mg total) by mouth every evening. 90 tablet 3  . TELMISARTAN 40 MG PO TABS Oral Take 1 tablet (40 mg total) by mouth daily. 90 tablet 4  . TRAMADOL HCL 50 MG PO TABS  As needed      BP 122/73  Pulse 78  Temp(Src) 97 F (36.1 C) (Oral)  Resp 20  SpO2 97%  Physical Exam  Nursing note and vitals reviewed. Constitutional: She is oriented to person, place, and time. She appears well-developed and well-nourished.  Abdominal: Soft. Bowel sounds are normal. She exhibits distension. She exhibits no mass. There is no tenderness. There is no rebound and no guarding.       Ventral  hernia, s/p mult surg. , soft bs pos, no masses.  Neurological: She is alert and oriented to person, place, and time.  Skin: Skin is warm and dry.  Psychiatric: She has a normal mood and affect.    ED Course  Procedures (including critical care time)  Labs Reviewed - No data to display No results found.   1. Ventral hernia, recurrent       MDM          Barkley Bruns, MD 11/12/11 1153  Barkley Bruns, MD 11/13/11 [phone]

## 2011-11-13 ENCOUNTER — Telehealth: Payer: Self-pay | Admitting: Oncology

## 2011-11-13 NOTE — Telephone Encounter (Signed)
lmonvm advising the pt of her feb 2013 appt

## 2011-11-15 ENCOUNTER — Ambulatory Visit: Payer: Medicare Other | Admitting: Oncology

## 2011-11-18 ENCOUNTER — Other Ambulatory Visit: Payer: Medicare Other

## 2011-11-22 ENCOUNTER — Ambulatory Visit (HOSPITAL_COMMUNITY)
Admission: RE | Admit: 2011-11-22 | Discharge: 2011-11-22 | Disposition: A | Discharge status: Home / Self Care | Payer: Medicare Other | Source: Ambulatory Visit | Attending: Oncology | Admitting: Oncology

## 2011-11-22 ENCOUNTER — Ambulatory Visit: Payer: Medicare Other | Admitting: Oncology

## 2011-11-22 DIAGNOSIS — R1909 Other intra-abdominal and pelvic swelling, mass and lump: Secondary | ICD-10-CM | POA: Diagnosis not present

## 2011-11-22 DIAGNOSIS — R109 Unspecified abdominal pain: Secondary | ICD-10-CM | POA: Diagnosis not present

## 2011-11-22 DIAGNOSIS — D7389 Other diseases of spleen: Secondary | ICD-10-CM | POA: Insufficient documentation

## 2011-11-22 DIAGNOSIS — Z85850 Personal history of malignant neoplasm of thyroid: Secondary | ICD-10-CM | POA: Insufficient documentation

## 2011-11-22 DIAGNOSIS — D1803 Hemangioma of intra-abdominal structures: Secondary | ICD-10-CM | POA: Diagnosis not present

## 2011-11-22 DIAGNOSIS — E039 Hypothyroidism, unspecified: Secondary | ICD-10-CM

## 2011-11-22 DIAGNOSIS — E7800 Pure hypercholesterolemia, unspecified: Secondary | ICD-10-CM | POA: Diagnosis not present

## 2011-11-22 DIAGNOSIS — Z85820 Personal history of malignant melanoma of skin: Secondary | ICD-10-CM | POA: Diagnosis not present

## 2011-11-22 DIAGNOSIS — Z85038 Personal history of other malignant neoplasm of large intestine: Secondary | ICD-10-CM | POA: Diagnosis not present

## 2011-11-22 DIAGNOSIS — I119 Hypertensive heart disease without heart failure: Secondary | ICD-10-CM

## 2011-11-22 DIAGNOSIS — R1011 Right upper quadrant pain: Secondary | ICD-10-CM | POA: Insufficient documentation

## 2011-11-23 ENCOUNTER — Other Ambulatory Visit: Payer: Self-pay | Admitting: Cardiology

## 2011-11-23 DIAGNOSIS — I119 Hypertensive heart disease without heart failure: Secondary | ICD-10-CM

## 2011-11-23 NOTE — Telephone Encounter (Signed)
Please let her know when done

## 2011-11-24 ENCOUNTER — Telehealth: Payer: Self-pay | Admitting: Cardiology

## 2011-11-24 DIAGNOSIS — I119 Hypertensive heart disease without heart failure: Secondary | ICD-10-CM

## 2011-11-24 MED ORDER — HYDROCHLOROTHIAZIDE 25 MG PO TABS: 25.0000 mg | ORAL_TABLET | Freq: Every day | ORAL | Status: DC

## 2011-11-24 NOTE — Telephone Encounter (Signed)
Rx sent again

## 2011-11-24 NOTE — Telephone Encounter (Signed)
Refill F/U  Patient said she never received hydrochlorothiazide (HYDRODIURIL) 25 MG tablet RX,   Records shows Dr. Patty Sermons authorized on 10/06/11  Patient Sig: Take 1 tablet (25 mg total) by mouth daily.   Ordered on: 10/06/2011   Authorized by: Cassell Clement   Dispense: 90 tablet   I verifed pharmacy is correct, [facility] Patient would like a return call at hm# [phone] or cell@ [phone]  This msg will also be routed to Melissa P. Per patient request for note to her

## 2011-11-25 ENCOUNTER — Ambulatory Visit: Payer: Medicare Other | Admitting: Oncology

## 2011-11-25 ENCOUNTER — Ambulatory Visit: Payer: Medicare Other | Admitting: Physician Assistant

## 2011-11-25 DIAGNOSIS — C44319 Basal cell carcinoma of skin of other parts of face: Secondary | ICD-10-CM | POA: Diagnosis not present

## 2011-11-26 ENCOUNTER — Encounter: Payer: Self-pay | Admitting: Physician Assistant

## 2011-11-26 ENCOUNTER — Ambulatory Visit (HOSPITAL_BASED_OUTPATIENT_CLINIC_OR_DEPARTMENT_OTHER): Payer: Medicare Other | Admitting: Physician Assistant

## 2011-11-26 VITALS — BP 126/79 | HR 85 | Temp 97.5°F | Ht 61.0 in | Wt 144.6 lb

## 2011-11-26 DIAGNOSIS — C44319 Basal cell carcinoma of skin of other parts of face: Secondary | ICD-10-CM | POA: Diagnosis not present

## 2011-11-26 DIAGNOSIS — D472 Monoclonal gammopathy: Secondary | ICD-10-CM | POA: Diagnosis not present

## 2011-11-26 DIAGNOSIS — C44311 Basal cell carcinoma of skin of nose: Secondary | ICD-10-CM | POA: Insufficient documentation

## 2011-11-26 NOTE — Progress Notes (Signed)
Hematology and Oncology Follow Up Visit  [name] [id number] Feb 24, 1941 71 y.o. 11/26/2011    HPI: [name] has a complex medical history which includes a history of both melanoma and papillary thyroid cancer. She was evaluated in our office originally in 2008 after serum protein electrophoresis, which showed an M-spike of 800 mg.  A total immunoglobulin G was 1374, total A was 46, and total M was 39.  Immunofixation showed IgG lambda specificity for the monoclonal component.    [name] was evaluated and was diagnosed with monoclonal gammopathy of uncertain significance (MGUS).  Interim History:   [name] returns today accompanied by her friend Arline Asp for followup of her MGUS. Interim history since her visit here in April of 2012 includes a recent excision of a basal cell carcinoma from the tip of the nose. In fact, [name] is scheduled for her plastic surgery  later today under the care of Dr. Kelly Splinter. She continues to have multiple medical issues and "spends all her time going to doctors". She is very upbeat, however, about her situation, and although she admits to feeling a little depressed at times, denies any feelings of hopelessness.  [name] continues to have some abdominal pain, primarily in the upper right quadrant, and this is a chronic issue. She does have a history of a ventral hernia. She is somewhat fatigued at times. She has no nausea, and no change in bowel habits. No rectal bleeding. No evidence of abnormal bleeding elsewhere. No fevers, chills, or night sweats.  A detailed review of systems is otherwise noncontributory as noted below.  Review of Systems: Constitutional:  no weight loss, fever, night sweats and feels well Eyes:blurred vision, unchanged WUJ:WJXBJY post excision on tip of nose Cardiovascular: no chest pain or dyspnea on exertion Respiratory: no cough, increased shortness of breath, or wheezing Neurological: negative Dermatological: negative Gastrointestinal: no  abdominal pain, nausea, change in bowel habits, or black or bloody stools Genito-Urinary: no dysuria, trouble voiding, or hematuria Hematological and Lymphatic: negative Breast: negative Musculoskeletal: negative Remaining ROS negative.  PAST MEDICAL HISTORY:  Very extensive but I will concentrate on the patient's cancers.  These are basically: 1. History of right ocular melanoma, status post enucleation and brachytherapy (I do not have the actual records from the treatments, which were performed in [place], I believe in 2005; 2. History of papillary carcinoma of the thyroid, resected at [facility] approximately 3 years ago and followed by radiation to that area (most likely I-131 but again, I do not have those records).  Multiple other medical problems include hypertension, history of left body stroke with mild residuals, history of superficial thrombophlebitis, history of mitral valve prolapse, history of hyperlipidemia, history of GERD, history of ventral hernia repair x 2, history of hysterectomy with bilateral salpingo-oophorectomy for fibroids at age 51, history of fibrocystic breast changes, history of reactive airway disease, history of removal of a left axillary mass November 09, 2006, which was benign, history of allergic rhinitis, history of depression, history of osteoporosis, history of remote deep vein thrombosis.  FAMILY HISTORY:  The patient's father had a history of prostate cancer and kidney cancer.  He died at age 20.  The patient's mother had a history of stroke and died at age 74.  The patient had a brother who committed suicide, a sister who had cervical cancer, but did fine after hysterectomy and a sister who had "a very unusual cancer" involving the jaw and neck, now recurrent.  She is treated, I believe, out of [place].  GYN HISTORY:  She is GX P2.  Did not take hormone replacement after her hysterectomy.    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 She is now retired, divorced, and lives by herself.   Her son, Nithya Meriweather, lives in [place] and works as a Animator.  Her son, Jorja Loa, is a "tree man", again locally.  The patient has 3 grandchildren, one in [place] and two here in town. She attends [employer]    Medications:   I have reviewed the patient's current medications.  Current Outpatient Prescriptions  Medication Sig Dispense Refill  . Acetaminophen (TYLENOL 8 HOUR PO) Take by mouth as needed.        [name] albuterol (VENTOLIN HFA) 108 (90 BASE) MCG/ACT inhaler Inhale 2 puffs into the lungs every 6 (six) hours as needed.      . ALPRAZolam (XANAX) 0.5 MG tablet Take 0.5 mg by mouth daily as needed.      [name] amLODipine (NORVASC) 5 MG tablet Take 1 tablet (5 mg total) by mouth daily.  90 tablet  3  . aspirin 81 MG tablet Take 81 mg by mouth daily.        . calcium citrate-vitamin D (CITRACAL+D) 315-200 MG-UNIT per tablet Take 1 tablet by mouth daily.       . famotidine (PEPCID) 20 MG tablet One at bedtime      . fexofenadine (ALLEGRA) 180 MG tablet 1/2 tablet daily      . fluticasone (FLONASE) 50 MCG/ACT nasal spray Place 2 sprays into the nose daily as needed.       . hydrochlorothiazide (HYDRODIURIL) 25 MG tablet Take 1 tablet (25 mg total) by mouth daily.  90 tablet  3  . levothyroxine (SYNTHROID, LEVOTHROID) 125 MCG tablet Take 125 mcg by mouth daily.        . Probiotic Product (PROBIOTIC PO) Take by mouth daily.        . simvastatin (ZOCOR) 20 MG tablet Take 1 tablet (20 mg total) by mouth every evening.  90 tablet  3  . telmisartan (MICARDIS) 40 MG tablet Take 1 tablet (40 mg total) by mouth daily.  90 tablet  4  . traMADol (ULTRAM) 50 MG tablet As needed        Allergies:  Allergies  Allergen Reactions  . Hydrocodone   . Levaquin   . Sudafed (Pseudoephedrine Hcl)   . Sulfur   . Zoloft     Physical  Exam: Filed Vitals:   11/26/11 0933  BP: 126/79  Pulse: 85  Temp: 97.5 F (36.4 C)   HEENT:  Sclerae anicteric, conjunctivae pink.  Clean dry dressing is intact on the tip of the nose.  Nodes:  No cervical, supraclavicular, or axillary lymphadenopathy palpated.  Breast Exam:  Deferred Lungs:  Clear to auscultation bilaterally.  No crackles, rhonchi, or wheezes.   Heart:  Regular rate and rhythm.   Abdomen:  Soft, nontender.  Positive bowel sounds.  No organomegaly or masses palpated.   Musculoskeletal:  No focal spinal tenderness to gentle palpation.  Extremities:  Benign.  No peripheral edema or cyanosis.   Skin:  Benign.   Neuro:  Nonfocal.   Lab Results: Lab Results  Component Value Date   WBC 5.6 11/11/2011   HGB 13.0 11/11/2011   HCT 38.1 11/11/2011   MCV 86.4 11/11/2011   PLT  278 11/11/2011   NEUTROABS 2.9 11/11/2011     Chemistry      Component Value Date/Time   NA 137 11/11/2011 1028   K 3.8 11/11/2011 1028   CL 102 11/11/2011 1028   CO2 26 11/11/2011 1028   BUN 11 11/11/2011 1028   CREATININE 0.74 11/11/2011 1028      Component Value Date/Time   CALCIUM 9.8 11/11/2011 1028   ALKPHOS 43 11/11/2011 1028   AST 20 11/11/2011 1028   ALT 16 11/11/2011 1028   BILITOT 0.8 11/11/2011 1028     Also drawn on 11/11/2011:  Kappa free light chain, 0.03;  [place]amba free light chain elevated at 19.1 (this is up from 15.9 in August 2012, 14.5 in March of 2012, and 12.8 in September 2011); Kappa:Lambda Ratio of 0.  IgG normal at 1340. IgA 8 low at 24. IgM low at 20.    Radiological Studies:  US Abdomen Complete  11/22/2011  *RADIOLOGY REPORT*  Clinical Data:  Left upper quadrant abdominal pain.  History of melanoma, thyroid cancer, and colon cancer.  COMPLETE ABDOMINAL ULTRASOUND 11/22/2011:  Comparison:  CT abdomen and pelvis 07/09/2011, 05/06/2011 and MRI abdomen 05/08/2011 [facility].  Findings:  Gallbladder:  No shadowing gallstones or echogenic sludge.  No gallbladder wall thickening or  pericholecystic fluid.  Negative sonographic Murphy's sign according to the ultrasound technologist. Prominent fundal fold.  Common bile duct:  Normal in caliber with maximum diameter approximating 2 mm.  Liver:  Approximate 1.0 x 0.9 1.0 cm hyperechoic nodule in the right lobe near the dome, corresponding to the hyperenhancing nodule on CT and the T2 hyperintense nodule on MRI, unchanged.  No new or suspicious liver lesions.  Normal hepatic parenchymal echotexture.  Patent portal vein with hepatopetal flow.  IVC:  Patent.  Pancreas:  Although the pancreas is difficult to visualize in its entirety, no focal pancreatic abnormality is identified.  The head was incompletely imaged due to duodenal bowel gas.  Spleen:  Approximate 1.3 x 1.2 x 1.1 cm hypoechoic mass in the upper spleen, demonstrating acoustic enhancement, corresponding to the complex cyst identified on the prior CT and MR.  No significant focal splenic parenchymal abnormalities.  Normal in size.  Right Kidney:  No hydronephrosis.  Well-preserved cortex.  No shadowing calculi.  Normal size and parenchymal echotexture without focal abnormalities.  Approximately 9.7 cm in length.  Left Kidney:  No hydronephrosis.  Well-preserved cortex.  No shadowing calculi.  Normal size and parenchymal echotexture without focal abnormalities.  Approximately 10.5 cm in length.  Abdominal aorta:  Normal in caliber throughout its visualized course in the abdomen without significant atherosclerosis.  IMPRESSION:  1.  No acute abnormalities.  No evidence of metastatic disease sonographically. 2.  Stable approximate 1 cm hemangioma in the right lobe of the liver near the dome. 3.  Stable complex cyst in the upper pole of the spleen.  Original Report Authenticated By: Arnell Sieving, M.D.     Assessment:  A 71 year old [place] woman (who is the aunt of our manager Stephanie V) with a history of  1.  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 2.  History of papillary thyroid cancer, status post near total thyroidectomy in November of 2005 followed by radioactive iodine ablation. 3.  Monoclonal gammopathy of uncertain significance, diagnosed in 2008, most recent total IgG stable, 4.  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  Followed by Dr. Janalyn Harder. 5.  Status post Moh's surgery for basal cell carcinoma in the mid nose, tip of the nose, in August of 2011 and again in February 2013.  Plan:  Clinically, [place] continues to appear stable with regards to her MGUS diagnosis. Dr. Darnelle Catalan will review her labs, and we will contact [name] next week with her followup schedule.   In the meanwhile she'll continue to be followed by her other physicians for her multiple medical problems. Of course she knows to call us at any time with any changes, problems, or questions.    This plan was reviewed with the patient, who voices understanding and agreement.    Zollie Scale, PA-C 11/26/2011

## 2011-11-29 ENCOUNTER — Ambulatory Visit: Payer: Medicare Other | Admitting: Oncology

## 2011-11-29 ENCOUNTER — Other Ambulatory Visit: Payer: Self-pay | Admitting: Physician Assistant

## 2011-11-29 DIAGNOSIS — D472 Monoclonal gammopathy: Secondary | ICD-10-CM

## 2011-12-02 ENCOUNTER — Telehealth: Payer: Self-pay | Admitting: Cardiology

## 2011-12-02 DIAGNOSIS — H3589 Other specified retinal disorders: Secondary | ICD-10-CM | POA: Insufficient documentation

## 2011-12-02 DIAGNOSIS — H353190 Nonexudative age-related macular degeneration, unspecified eye, stage unspecified: Secondary | ICD-10-CM | POA: Insufficient documentation

## 2011-12-02 DIAGNOSIS — I119 Hypertensive heart disease without heart failure: Secondary | ICD-10-CM

## 2011-12-02 DIAGNOSIS — Z961 Presence of intraocular lens: Secondary | ICD-10-CM | POA: Insufficient documentation

## 2011-12-02 MED ORDER — HYDROCHLOROTHIAZIDE 25 MG PO TABS: 25.0000 mg | ORAL_TABLET | Freq: Every day | ORAL | Status: DC

## 2011-12-02 NOTE — Telephone Encounter (Signed)
busy

## 2011-12-02 NOTE — Telephone Encounter (Signed)
Didn't get from [facility]

## 2011-12-02 NOTE — Telephone Encounter (Signed)
Patient called concerning a refill issue, but demands to speak with Nurse MP. I did manage to verify pharmacy is correct but she did not want to discuss anything else with an but Regis Bill.  She can be reached at hm# [phone].

## 2011-12-03 ENCOUNTER — Telehealth: Payer: Self-pay | Admitting: Cardiology

## 2011-12-03 NOTE — Telephone Encounter (Signed)
Error

## 2011-12-07 ENCOUNTER — Other Ambulatory Visit: Payer: Self-pay | Admitting: Obstetrics and Gynecology

## 2011-12-07 DIAGNOSIS — Z1231 Encounter for screening mammogram for malignant neoplasm of breast: Secondary | ICD-10-CM

## 2011-12-08 ENCOUNTER — Telehealth: Payer: Self-pay | Admitting: Internal Medicine

## 2011-12-08 NOTE — Telephone Encounter (Signed)
Called, spke with pt.  She c/o sore throat that "comes and goes," started feeling "sluggish" on Monday, coughing some, PND, burning sensation in chest, and increased SOB.  Pt states this has been going on since she was last seen in our office and just has not gotten completely better.  She is taking albuterol, tylenol, allegra, and delsym -- does not want to wait until Friday.  OV scheduled with Dr. Craige Cotta for tomorrow at 3:15 pm and appt with MW on Friday cancelled -- pt aware.  I did advised to seek emergency care if symptoms worsen prior to OV.  She verbalized understanding and in agreement with this plan.

## 2011-12-09 ENCOUNTER — Ambulatory Visit (INDEPENDENT_AMBULATORY_CARE_PROVIDER_SITE_OTHER): Payer: Medicare Other | Admitting: Pulmonary Disease

## 2011-12-09 ENCOUNTER — Encounter: Payer: Self-pay | Admitting: Pulmonary Disease

## 2011-12-09 VITALS — BP 110/70 | HR 73 | Temp 97.9°F | Ht 61.0 in | Wt 143.2 lb

## 2011-12-09 DIAGNOSIS — R059 Cough, unspecified: Secondary | ICD-10-CM | POA: Diagnosis not present

## 2011-12-09 DIAGNOSIS — R05 Cough: Secondary | ICD-10-CM

## 2011-12-09 MED ORDER — AZITHROMYCIN 250 MG PO TABS: ORAL_TABLET | ORAL | Status: AC

## 2011-12-09 MED ORDER — FLUTICASONE-SALMETEROL 250-50 MCG/DOSE IN AEPB: 1.0000 | INHALATION_SPRAY | Freq: Two times a day (BID) | RESPIRATORY_TRACT | Status: DC

## 2011-12-09 NOTE — Progress Notes (Signed)
Chief Complaint  Patient presents with  . Acute office visit    MW pt.Marland KitchenMarland KitchenPt c/o cough with thick mucus, sore throat,chest tightness and chills.     History of Present Illness: [name] is a 71 y.o. female never smoker with asthma.  She is followed by Dr. Sherene Sires.  She was sick in December with the flu.  She has been getting a cough since.  Her symptoms got worse 2 days ago.  She has been feeling week in the chest with a sore throat.  She has been getting hoarse, and has sinus drainage.  She is coughing up clear to yellow sputum.  She has been wheezing.  She feels feverish with chills, but hasn't check her temperature.  She has been using albuterol more.  She denies stomach problems. Sick since December, flu then virus   Past Medical History  Diagnosis Date  . 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 Cancer     THYROID, SKIN, NOSE  . History of blood clots     LEG  . Stroke   . Hernia   . MGUS (monoclonal gammopathy of unknown significance)   . Chronic kidney disease   . Hypertension   . Hyperlipidemia   . Thyroid disease   . Arthritis     Past Surgical History  Procedure Date  . Abdominal hysterectomy   . Rotator cuff repair 2004    RIGHT SHOULDER  . Hernia repair   . Leg surgery     VEIN & BLOOD CLOT  . Cardiovascular stress test 2006    NORMAL  . Transthoracic echocardiogram 2006    Allergies  Allergen Reactions  . Hydrocodone   . Levaquin   . Sudafed (Pseudoephedrine Hcl)   . Sulfur   . Zoloft     Physical Exam:  Blood pressure 110/70, pulse 73, temperature 97.9 F (36.6 C), temperature source Oral, height 5\' 1"  (1.549 m), weight 143 lb 3.2 oz (64.955 kg), SpO2 97.00%. Body mass index is 27.06 kg/(m^2). Wt Readings from Last 2 Encounters:  12/09/11 143 lb 3.2 oz (64.955 kg)  11/26/11 144 lb 9.6 oz (65.59 kg)    General - No distress, speaking in full sentences HEENT - no sinus tenderness, no oral exudate Cardiac - s1s2 regular Chest - faint  wheeze, clear with cough Abdomen - soft, nontender Extremities - no edema Skin - no rashes Neurologic - normal strength Psychiatric - normal mood, behavior   Assessment/Plan:  Outpatient Encounter Prescriptions as of 12/09/2011  Medication Sig Dispense Refill  . Acetaminophen (TYLENOL 8 HOUR PO) Take by mouth as needed.        [name] albuterol (VENTOLIN HFA) 108 (90 BASE) MCG/ACT inhaler Inhale 2 puffs into the lungs every 6 (six) hours as needed.      . ALPRAZolam (XANAX) 0.5 MG tablet Take 0.5 mg by mouth daily as needed.      [name] amLODipine (NORVASC) 5 MG tablet Take 1 tablet (5 mg total) by mouth daily.  90 tablet  3  . aspirin 81 MG tablet Take 81 mg by mouth daily.        . calcium citrate-vitamin D (CITRACAL+D) 315-200 MG-UNIT per tablet Take 1 tablet by mouth 2 (two) times daily.       . famotidine (PEPCID) 20 MG tablet Take 20 mg by mouth at bedtime as needed. One at bedtime      . fexofenadine (ALLEGRA) 180 MG tablet 1/2 tablet daily      .  fluticasone (FLONASE) 50 MCG/ACT nasal spray Place 2 sprays into the nose daily as needed.       . hydrochlorothiazide (HYDRODIURIL) 25 MG tablet Take 1 tablet (25 mg total) by mouth daily.  90 tablet  3  . levothyroxine (SYNTHROID, LEVOTHROID) 125 MCG tablet Take 125 mcg by mouth daily.        [name] omeprazole (PRILOSEC) 20 MG capsule Take 20 mg by mouth daily as needed.      . Probiotic Product (PROBIOTIC PO) Take by mouth daily.        . simvastatin (ZOCOR) 20 MG tablet Take 1 tablet (20 mg total) by mouth every evening.  90 tablet  3  . telmisartan (MICARDIS) 40 MG tablet Take 1 tablet (40 mg total) by mouth daily.  90 tablet  4  . traMADol (ULTRAM) 50 MG tablet As needed      . DISCONTD: famotidine (PEPCID) 20 MG tablet One at bedtime        Julianna Vanwagner Pager:  [phone] 12/09/2011, 3:29 PM

## 2011-12-09 NOTE — Assessment & Plan Note (Signed)
Has an acute asthmatic bronchitis.  Will give course of Zpak.  Have given sample of advair.  She can continue prn albuterol.  I don't think she needs prednisone or a chest xray at this time.  She has f/u scheduled with Dr. Sherene Sires next week.  I have advised her to keep his appointment for now, but she can reschedule if she starts feeling better over the weekend.

## 2011-12-09 NOTE — Patient Instructions (Signed)
Zithromax 250 mg>>2 pills on first day, then one pill daily for 4 days Advair one puff twice per day until sample completed Ventolin two puffs as needed for cough, wheeze, or chest congestion Follow up with Dr. Sherene Sires as scheduled

## 2011-12-10 ENCOUNTER — Ambulatory Visit: Payer: Medicare Other | Admitting: Internal Medicine

## 2011-12-14 DIAGNOSIS — M25579 Pain in unspecified ankle and joints of unspecified foot: Secondary | ICD-10-CM | POA: Diagnosis not present

## 2011-12-15 DIAGNOSIS — M940 Chondrocostal junction syndrome [Tietze]: Secondary | ICD-10-CM | POA: Diagnosis not present

## 2011-12-15 DIAGNOSIS — N644 Mastodynia: Secondary | ICD-10-CM | POA: Diagnosis not present

## 2011-12-16 ENCOUNTER — Ambulatory Visit: Payer: Medicare Other | Admitting: Internal Medicine

## 2011-12-16 DIAGNOSIS — C6930 Malignant neoplasm of unspecified choroid: Secondary | ICD-10-CM | POA: Diagnosis not present

## 2011-12-16 DIAGNOSIS — M25579 Pain in unspecified ankle and joints of unspecified foot: Secondary | ICD-10-CM | POA: Diagnosis not present

## 2011-12-16 DIAGNOSIS — H4310 Vitreous hemorrhage, unspecified eye: Secondary | ICD-10-CM | POA: Diagnosis not present

## 2011-12-16 DIAGNOSIS — IMO0002 Reserved for concepts with insufficient information to code with codable children: Secondary | ICD-10-CM | POA: Insufficient documentation

## 2011-12-16 DIAGNOSIS — Z961 Presence of intraocular lens: Secondary | ICD-10-CM | POA: Diagnosis not present

## 2011-12-16 DIAGNOSIS — H353190 Nonexudative age-related macular degeneration, unspecified eye, stage unspecified: Secondary | ICD-10-CM | POA: Diagnosis not present

## 2011-12-16 DIAGNOSIS — H2510 Age-related nuclear cataract, unspecified eye: Secondary | ICD-10-CM | POA: Diagnosis not present

## 2011-12-20 ENCOUNTER — Ambulatory Visit (HOSPITAL_COMMUNITY)
Admission: RE | Admit: 2011-12-20 | Discharge: 2011-12-20 | Disposition: A | Discharge status: Home / Self Care | Payer: Medicare Other | Source: Ambulatory Visit | Attending: Obstetrics and Gynecology | Admitting: Obstetrics and Gynecology

## 2011-12-20 DIAGNOSIS — Z1231 Encounter for screening mammogram for malignant neoplasm of breast: Secondary | ICD-10-CM | POA: Insufficient documentation

## 2011-12-21 ENCOUNTER — Encounter: Payer: Self-pay | Admitting: Internal Medicine

## 2011-12-21 ENCOUNTER — Ambulatory Visit (INDEPENDENT_AMBULATORY_CARE_PROVIDER_SITE_OTHER): Payer: Medicare Other | Admitting: Internal Medicine

## 2011-12-21 VITALS — BP 120/88 | HR 72 | Temp 97.8°F | Ht 61.0 in | Wt 143.2 lb

## 2011-12-21 DIAGNOSIS — R059 Cough, unspecified: Secondary | ICD-10-CM

## 2011-12-21 DIAGNOSIS — R05 Cough: Secondary | ICD-10-CM | POA: Diagnosis not present

## 2011-12-21 MED ORDER — FAMOTIDINE 20 MG PO TABS: 20.0000 mg | ORAL_TABLET | Freq: Every day | ORAL | Status: DC

## 2011-12-21 MED ORDER — OMEPRAZOLE 20 MG PO CPDR: 40.0000 mg | DELAYED_RELEASE_CAPSULE | Freq: Every day | ORAL | Status: DC | PRN

## 2011-12-21 MED ORDER — TRAMADOL HCL 50 MG PO TABS: 50.0000 mg | ORAL_TABLET | Freq: Four times a day (QID) | ORAL | Status: DC | PRN

## 2011-12-21 MED ORDER — OMEPRAZOLE 20 MG PO CPDR: DELAYED_RELEASE_CAPSULE | ORAL | Status: DC

## 2011-12-21 NOTE — Progress Notes (Signed)
Subjective:    Patient ID: [name], female    DOB: 1941/02/20    MRN: [id number]    Brief patient profile:  1 yowf never smoker with ? H/o asthma/ allergies to dust mold relatively well controlled since 2010 on advair and cough meds as needed but no maintenance rx previous to 2010 then  persistent cough since Sep 21 2011 while on advair and referred by Dr Clarene Duke 10/21/2011 for pulmonary evaluation.   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  Started advair >  no benefit. Sob mostly with coughing but some better p ventolin.  No lateralizing pleuritic cp, no ex cp. Prev cough w/u in 2002 in pulmonary clinic by [facility] with dx of ? Asthma/ ? gerd with uacs. >>imp was uacs, rec d/c advari and rx  w/ cough suprression w/ delsym/tramadol , steroid taper, and PPI/Pepcid combo.    11/04/2011 NP Follow up   Last ov started on cough suppression regimen with GERD prevention. CXR with no acute process. Cough is better but not completely gone.  Stopped her cough meds and PPI 2 days ago.  She has a lot of stress as she has upcoming Nose surgery for melanoma.  No discolored mucus or fever.  No chest pain or edema.  >>cont on PPI /pepcid , cough suppression rx   2/7/2013NP  Follow up  Pt is here to have check up prior to leaving on vacation . She is still doing well with less cough but developed sore throat yesterday. Had switched from allegra to zyrtec however was unable to tolerate  Zyrtec . She is now back on allegra. No increased cough, wheezing or fever. rec Use Delsym 2 tsp Twice daily  Until cough is gone.  May use Tramadol 50mg  in addition to help with cough -take 1 every 4 hr As needed   May use Allegra daily for drainage.  Take  Prilosec daily before meal  Take  Pepcid 20mg  At bedtime     12/09/11 ov/Sood ? [facility] rx Zithromax 250 mg>>2 pills on first day,  then one pill daily for 4 days Advair one puff twice per day until sample completed Ventolin two puffs as needed for cough, wheeze, or chest congestion  12/21/2011 f/u ov/Allyna Pittsley  Cc only some better >  failed to answer a single question asked in a straightforward manner, tending to go off on tangents or answer questions with ambiguous medical terms or diagnoses and seemed aggravated/perplexed/ bewildered  when asked the same question more than once for clarification. Apparently cough resolved to her satisfaction but now has sob and sorethroat back on advair which I rec stopping in January with ? Benefit (certainly no increase in asthma symptoms or need for saba    Sleeping ok without nocturnal  or early am exacerbation  of respiratory  c/o's or need for noct saba. Also denies any obvious fluctuation of symptoms with weather or environmental changes or other aggravating or alleviating factors except as outlined above             Objective:   Physical Exam  Hoarse amb wf nad  Wt  144 10/21/2011>144 11/04/2011 >144 11/11/2011 > 12/21/2011  143   HEENT: nl dentition, turbinates, and orophanx. Nl external ear canals without cough reflex   NECK :  without JVD/Nodes/TM/ nl carotid upstrokes bilaterally   LUNGS: no  acc muscle use, clear to A and P bilaterally without cough on insp or exp maneuvers   CV:  RRR  no s3 or murmur or increase in P2, no edema   ABD:  soft and nontender with nl excursion in the supine position. No bruits or organomegaly, bowel sounds nl  MS:  warm without deformities, calf tenderness, cyanosis or clubbing  SKIN: warm and dry without lesions    NEURO:  alert, approp, no deficits   CXR  10/21/2011 :  1. No specific cause for the patient's cough is identified.  2. Old granulomatous disease.  3. Upper thoracic spondylosis      Assessment & Plan:

## 2011-12-21 NOTE — Patient Instructions (Addendum)
GERD (REFLUX)  is an extremely common cause of respiratory symptoms(just like yours!) , many times with no significant heartburn at all.    It can be treated with medication, but also with lifestyle changes including avoidance of late meals, excessive alcohol, smoking cessation, and avoid fatty foods, chocolate, peppermint, colas, red wine, and acidic juices such as orange juice.  NO MINT OR MENTHOL PRODUCTS SO NO COUGH DROPS  USE SUGARLESS CANDY INSTEAD (jolley ranchers or Stover's)  NO OIL BASED VITAMINS - use powdered substitutes.  Try prilosec 40mg   Take 30-60 min before first meal of the day and Pepcid 20 mg one bedtime until return  Stop advair  Prednisone 10 mg take  4 each am x 2 days,   2 each am x 2 days,  1 each am x2days and stop   Take delsym two tsp every 12 hours and supplement if needed with  tramadol 50 mg up to 2 every 4 hours to suppress the urge to cough. Swallowing water or using ice chips/non mint and menthol containing candies (such as lifesavers or sugarless jolly ranchers) are also effective.  You should rest your voice and avoid activities that you know make you cough.  Once you have eliminated the cough for 3 straight days try reducing the tramadol first,  then the delsym as tolerated.    See Tammy NP w/in 2 weeks with all your medications, even over the counter meds, separated in two separate bags, the ones you take no matter what vs the ones you stop once you feel better and take only as needed when you feel you need them.   Tammy  will generate for you a new user friendly medication calendar that will put [place] all on the same page re: your medication use.     Without this process, it simply isn't possible to assure that we are providing  your outpatient care  with  the attention to detail we feel you deserve.   If we cannot assure that you're getting that kind of care,  then we cannot manage your problem effectively from this clinic.  Once you have seen Tammy and we  are sure that we're all on the same page with your medication use she will arrange follow up with me.

## 2011-12-22 ENCOUNTER — Telehealth: Payer: Self-pay | Admitting: Internal Medicine

## 2011-12-22 MED ORDER — PREDNISONE 10 MG PO TABS: ORAL_TABLET | ORAL | Status: DC

## 2011-12-22 NOTE — Telephone Encounter (Signed)
Pt calling again in ref to previous msg says she was supposed to start taking it yesterday.[name]

## 2011-12-22 NOTE — Telephone Encounter (Signed)
I looked in pt chart and she was suppose to be started on a pred taper and this was not sent in. I advised pt will send in rx and nothing further was needed. I have sent rx in

## 2011-12-23 DIAGNOSIS — M25579 Pain in unspecified ankle and joints of unspecified foot: Secondary | ICD-10-CM | POA: Diagnosis not present

## 2011-12-23 DIAGNOSIS — M7750 Other enthesopathy of unspecified foot: Secondary | ICD-10-CM | POA: Diagnosis not present

## 2011-12-25 NOTE — Assessment & Plan Note (Signed)
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   These are the same pts (now being labeled as having "irritable larynx syndrome" by some cough centers) who not infrequently have a history of having failed to tolerate ace inhibitors,  dry powder inhalers or biphosphonates or report having atypical reflux symptoms that don't respond to standard doses of PPI , and are easily confused as having aecopd or asthma flares by even experienced allergists/ pulmonologists.   If she does asthma best option would be qvar with the lowest incidence of cough and upper airway symptoms.  Discussed with pt: The standardized cough guidelines recently published in Chest by [place] in 2006  are a multiple step process (up to 12!) , not a single office visit,  and are intended  to address this problem logically,  with an alogrithm dependent on response to empiric treatment at  each progressive step  to determine a specific diagnosis with  minimal addtional testing needed. Therefore if compliance is an issue or can't be accurately verified then it's very unlikely the standard evaluation and treatment will be successful here.    Furthermore, response to therapy (other than acute cough suppression, which should only be used short term with avoidance of narcotic containing cough syrups if possible), can be a gradual process for which the patient may not receive  immediate benefit.  Unlike going to an eye doctor where the right rx is almost always the first one and is immediately effective, this is almost never the case in the management of chronic cough syndromes and the patient needs to commit up front to compliance with recommendations and have the patience to wait out a response for up to 6 weeks of therapy directed at the likely underlying problem(s).    She is struggling with concept of medication reconciliation so it will be very difficult to sort out this problem using outpt algorithms that require consistent treatment to intrerpret the response to empiric treatment.    To keep things simple, I have asked the patient to first separate medicines that are perceived as maintenance, that is to be taken daily "no matter what", from those medicines that are taken on only on an as-needed basis and I have given the patient examples of both, and then return to see our NP to generate a  detailed  medication calendar which should be followed until the next physician sees the patient and updates it.    [name]

## 2012-01-03 DIAGNOSIS — M25579 Pain in unspecified ankle and joints of unspecified foot: Secondary | ICD-10-CM | POA: Diagnosis not present

## 2012-01-04 ENCOUNTER — Encounter: Payer: Self-pay | Admitting: Adult Health

## 2012-01-04 ENCOUNTER — Ambulatory Visit (INDEPENDENT_AMBULATORY_CARE_PROVIDER_SITE_OTHER): Payer: Medicare Other | Admitting: Adult Health

## 2012-01-04 VITALS — BP 132/78 | HR 84 | Temp 96.3°F | Ht 61.0 in | Wt 142.0 lb

## 2012-01-04 DIAGNOSIS — R05 Cough: Secondary | ICD-10-CM

## 2012-01-04 DIAGNOSIS — R059 Cough, unspecified: Secondary | ICD-10-CM

## 2012-01-04 NOTE — Progress Notes (Signed)
Subjective:    Patient ID: [name], female    DOB: 05/06/1941    MRN: [id number]    Brief patient profile:  63 yowf never smoker with ? H/o asthma/ allergies to dust mold relatively well controlled since 2010 on advair and cough meds as needed but no maintenance rx previous to 2010 then  persistent cough since Sep 21 2011 while on advair and referred by Dr Clarene Duke 10/21/2011 for pulmonary evaluation.   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  Started advair >  no benefit. Sob mostly with coughing but some better p ventolin.  No lateralizing pleuritic cp, no ex cp. Prev cough w/u in 2002 in pulmonary clinic by [facility] with dx of ? Asthma/ ? gerd with uacs. >>imp was uacs, rec d/c advari and rx  w/ cough suprression w/ delsym/tramadol , steroid taper, and PPI/Pepcid combo.    11/04/2011 NP Follow up   Last ov started on cough suppression regimen with GERD prevention. CXR with no acute process. Cough is better but not completely gone.  Stopped her cough meds and PPI 2 days ago.  She has a lot of stress as she has upcoming Nose surgery for melanoma.  No discolored mucus or fever.  No chest pain or edema.  >>cont on PPI /pepcid , cough suppression rx   2/7/2013NP  Follow up  Pt is here to have check up prior to leaving on vacation . She is still doing well with less cough but developed sore throat yesterday. Had switched from allegra to zyrtec however was unable to tolerate  Zyrtec . She is now back on allegra. No increased cough, wheezing or fever. rec Use Delsym 2 tsp Twice daily  Until cough is gone.  May use Tramadol 50mg  in addition to help with cough -take 1 every 4 hr As needed   May use Allegra daily for drainage.  Take  Prilosec daily before meal  Take  Pepcid 20mg  At bedtime     12/09/11 ov/Sood ? [facility] Pe[facility] rx Zithromax 250 mg>>2 pills on first day,  then one pill daily for 4 days Advair one puff twice per day until sample completed Ventolin two puffs as needed for cough, wheeze, or chest congestion  12/21/2011 f/u ov/Wert  Cc only some better >  failed to answer a single question asked in a straightforward manner, tending to go off on tangents or answer questions with ambiguous medical terms or diagnoses and seemed aggravated/perplexed/ bewildered  when asked the same question more than once for clarification. Apparently cough resolved to her satisfaction but now has sob and sorethroat back on advair which I rec stopping in January with ? Benefit (certainly no increase in asthma symptoms or need for saba >>REC: steroid taper, stop advair and Prilosec 40mg  and Pepcid   01/04/2012 Follow up and Med review  Returns for 2 week follow up . Today. We reviewed all her medications and organize them into a medication calendar with patient education. It does appear that she is taking her medications correctly. She says that her cough has decreased some however, has not totally resolved. Feels that tramadol does help. It makes her sleepy. She did not have flare of cough or wheezing. Since stopping Advair. She denies any hemoptysis, chest pain, orthopnea, PND, or leg swelling. She says that she is under normal cement stress, as she's  had several cancer diagnosis. Over the last few years.   ROS:  Constitutional:   No  weight loss, night sweats,  Fevers, chills,  +fatigue, or  lassitude.  HEENT:   No headaches,  Difficulty swallowing,  Tooth/dental problems, or  Sore throat,                No sneezing, itching, ear ache,  +nasal congestion, post nasal drip,   CV:  No chest pain,  Orthopnea, PND, swelling in lower extremities, anasarca, dizziness, palpitations, syncope.   GI  No heartburn, indigestion, abdominal pain, nausea, vomiting, diarrhea, change in bowel habits, loss of appetite, bloody stools.   Resp:    No coughing up of blood.  No change in  color of mucus.  No wheezing.  No chest wall deformity  Skin: no rash or lesions.  GU: no dysuria, change in color of urine, no urgency or frequency.  No flank pain, no hematuria   MS:  No joint pain or swelling.  No decreased range of motion.  No back pain.  Psych:  No change in mood or affect. No depression or anxiety.  No memory loss.                Objective:   Physical Exam  Hoarse amb wf nad  Wt  144 10/21/2011>144 11/04/2011 >144 11/11/2011 > 12/21/2011  143 >>142 01/04/2012   HEENT: nl dentition, turbinates, and orophanx. Nl external ear canals without cough reflex   NECK :  without JVD/Nodes/TM/ nl carotid upstrokes bilaterally   LUNGS: no acc muscle use, clear to A and P bilaterally without cough on insp or exp maneuvers   CV:  RRR  no s3 or murmur or increase in P2, no edema   ABD:  soft and nontender with nl excursion in the supine position. No bruits or organomegaly, bowel sounds nl  MS:  warm without deformities, calf tenderness, cyanosis or clubbing  SKIN: warm and dry without lesions    NEURO:  alert, approp, no deficits   CXR  10/21/2011 :  1. No specific cause for the patient's cough is identified.  2. Old granulomatous disease.  3. Upper thoracic spondylosis      Assessment & Plan:

## 2012-01-04 NOTE — Patient Instructions (Signed)
Follow med calendar closely and bring to each visit.  Continue on current regimen .  Follow up in 6-8 weeks and As needed

## 2012-01-04 NOTE — Assessment & Plan Note (Signed)
Suspect is multifactoral in nature w/ upper airway irritation complicated by GERD/Rhinitis  Will continue on preventive regimen.  Patient's medications were reviewed today and patient education was given. Computerized medication calendar was adjusted/completed

## 2012-01-06 ENCOUNTER — Telehealth: Payer: Self-pay | Admitting: Internal Medicine

## 2012-01-06 DIAGNOSIS — M25579 Pain in unspecified ankle and joints of unspecified foot: Secondary | ICD-10-CM | POA: Diagnosis not present

## 2012-01-06 NOTE — Telephone Encounter (Signed)
I spoke with pt and she is wanting to change to [facility]. Please advise Dr. Craige Cotta if you are okay with the switch, thanks

## 2012-01-06 NOTE — Telephone Encounter (Signed)
Patient seen by TP 01-04-12 for med calendar.  Requesting to change physicians, currently sees Dr Sherene Sires.  Per protocol, will forward to Dr Sherene Sires to see if switch is okay.  Dr Sherene Sires please advise, thanks.

## 2012-01-06 NOTE — Telephone Encounter (Signed)
Ok with me 

## 2012-01-10 DIAGNOSIS — M25579 Pain in unspecified ankle and joints of unspecified foot: Secondary | ICD-10-CM | POA: Diagnosis not present

## 2012-01-10 NOTE — Telephone Encounter (Signed)
That is okay with me 

## 2012-01-11 NOTE — Telephone Encounter (Signed)
I spoke with pt and is scheduled to come in and see VS 02/21/12 at 1:45. Pt aware to arrive 15 min early to fill out paperwork

## 2012-01-12 DIAGNOSIS — M25579 Pain in unspecified ankle and joints of unspecified foot: Secondary | ICD-10-CM | POA: Diagnosis not present

## 2012-01-13 DIAGNOSIS — E890 Postprocedural hypothyroidism: Secondary | ICD-10-CM | POA: Diagnosis not present

## 2012-01-14 DIAGNOSIS — M25579 Pain in unspecified ankle and joints of unspecified foot: Secondary | ICD-10-CM | POA: Diagnosis not present

## 2012-01-17 DIAGNOSIS — M25579 Pain in unspecified ankle and joints of unspecified foot: Secondary | ICD-10-CM | POA: Diagnosis not present

## 2012-01-18 DIAGNOSIS — M25579 Pain in unspecified ankle and joints of unspecified foot: Secondary | ICD-10-CM | POA: Diagnosis not present

## 2012-01-20 ENCOUNTER — Ambulatory Visit: Payer: Medicare Other | Admitting: Dietician

## 2012-01-25 ENCOUNTER — Encounter: Payer: Medicare Other | Attending: Family Medicine | Admitting: Dietician

## 2012-01-25 ENCOUNTER — Encounter: Payer: Self-pay | Admitting: Dietician

## 2012-01-25 VITALS — Ht 61.0 in | Wt 137.9 lb

## 2012-01-25 DIAGNOSIS — E785 Hyperlipidemia, unspecified: Secondary | ICD-10-CM | POA: Diagnosis not present

## 2012-01-25 DIAGNOSIS — Z713 Dietary counseling and surveillance: Secondary | ICD-10-CM | POA: Insufficient documentation

## 2012-01-25 DIAGNOSIS — I119 Hypertensive heart disease without heart failure: Secondary | ICD-10-CM | POA: Diagnosis not present

## 2012-01-25 DIAGNOSIS — E7800 Pure hypercholesterolemia, unspecified: Secondary | ICD-10-CM

## 2012-01-25 NOTE — Patient Instructions (Addendum)
   Weight today is 137.9 lbs with clothes, not shoes on.  Use the sweet butter rather than the salted.  Be sure that you have serving a of starch at each lunch and dinner meal.  Keep at 1/2 to 1/3 cup of .sweet potatoes or  Rice  The natural fruit bar will be a snack for you.  Try a multi-grain rice cakes with the PB or the lite cream cheese, or eat with the lite canned fruit.  Pour off the juice.

## 2012-01-25 NOTE — Progress Notes (Signed)
Medical Nutrition Therapy:  Appt start time: 1230 end time:  1330. Assessment:  Primary concerns today: Concerned that her diet is correct and will help get the kidneys better. Seen as follow-up to initial referral for hyperlipidemia/hypertension with MD request for counseling regarding a 2000 mg Sodium, low carb, low residue diet. Concerned that her kidneys are not functioning as they should.  Unsure when her next appointment with Dr. Darrick Penna is scheduled.  Since her last visit, had the skin cancer removed from the bridge of her nose.  In the mean while, she had a bout of chest/bronchial congestion.  She reports that she is getting over it at this time.  To limit further issues when she is doing yard work,  she will wear a mask and clothes to keep down the effects of pollen. Has lost 5.4 lb since her last visit 11/02/11.  She does note that she feels like she is starving to death.  Asking for suggestions for food combinations that are low in sodium and carb.  Her daily recall reveals a diligence in trying to limit her sodium intake.  She reports a BP from an earlier MD appointment today of 120/80's.  Today, she has many questions, is rapidly taking notes and will surprisingly come up with an appropriate response or question during the teaching session.   MEDICATIONS: Completed a review of current medications.  She places great emphasis that she is not taking the stronger pain medication, but instead "is taking a Tylenol to help with the aches and little pains."  DIETARY INTAKE:  24-hr recall:  B (7:30-8:00 AM): oatmeal (mix of minute and the whole grain), cooks enough to last for for 4 days.  Uses 3/4-1 cup each morning with a little fat free milk, and 1/2-3/4 tsp of raw sugar or honey.  And 4oz of 50/50 OJ to take her morning pills.  Drinks water and sometimes 3/4 to 1/2 cup of decaf coffee and uses 1 tsp of the dairy creamer. OR Hardin Hardenbrook have an egg white omlett, and 1/3 of a slice of 2% cheese. Uses 1  tsp. black raspberry jelly (low carb) on the ommlett.  Eats No bread, bread contains too much sodium.   Snk (mid AM) :rare unless Activia yogurt.  Or a cuttie tangerine or 1/2 bananas.  L (1:00 PM): 3-4 crackers (low sodium) with PB or maybe a yogurt and 1/2 banana or strawberries.  Snk (mid  PM): occ. Corn chips with no sodium with a helping of lite cream cheese or a homemade salsa (onion, green and red pepper). D (4:40-6:30 PM): Pork chop, salsa over this,  Fresh Broccoli steamed with whipped butter.  Snk (8:00 PM): 1/2 cup of Special K with Berries and milk (fat free) OR apple sauce or the Actavia yogurt.  Sometimes enjoys: Lance Muss getting the child size portion; with the veggies and cheese and a few banana peppers and sweet onion oil with the lite mayo.  She is pleased to learn that this sandwich is less than 400 mg of sodium.  Beverages: green tea, water,decaf tea, decaf coffee,skim milk.  Recent physical activity: House work, yard work.  No regular schedule of exercise.  Estimated energy needs:Ht: 61 in  WT: 137.9 lb (63 Kg)  BMI: 26.1 kg/m2 1400 calories 155-160 g carbohydrates 100-105 g protein 37-39 g fat  Progress Towards Goal(s):  In progress.   Nutritional Diagnosis:  NB-1.1 Food and nutrition-related knowledge deficit As related to sodium and carbohydrate content of various foods.  As evidenced by multiple questions regarding food sources and their sodium conten, seeking confirmation regarding current food intake..    Intervention:  Nutritional counsel regarding the sodium content of various foods of interest.  Worked with establishing some serving sizes for starches and how to include starches in a portion size that would not increase blood glucose levels.  Handouts given during visit include:  The potassium list for fruits and vegetables.  Monitoring/Evaluation:  Dietary intake, exercise, and body weight in three months or following her visit to Dr. Darrick Penna.

## 2012-01-27 ENCOUNTER — Ambulatory Visit (INDEPENDENT_AMBULATORY_CARE_PROVIDER_SITE_OTHER): Payer: Medicare Other | Admitting: *Deleted

## 2012-01-27 DIAGNOSIS — C439 Malignant melanoma of skin, unspecified: Secondary | ICD-10-CM

## 2012-01-27 DIAGNOSIS — I119 Hypertensive heart disease without heart failure: Secondary | ICD-10-CM | POA: Diagnosis not present

## 2012-01-27 LAB — CBC WITH DIFFERENTIAL/PLATELET
Basophils Absolute: 0 10*3/uL (ref 0.0–0.1)
Basophils Relative: 0.6 % (ref 0.0–3.0)
Eosinophils Absolute: 0.3 10*3/uL (ref 0.0–0.7)
Lymphocytes Relative: 36.6 % (ref 12.0–46.0)
MCHC: 34.1 g/dL (ref 30.0–36.0)
Neutrophils Relative %: 50.3 % (ref 43.0–77.0)
Platelets: 247 10*3/uL (ref 150.0–400.0)
RBC: 4.43 Mil/uL (ref 3.87–5.11)
RDW: 12.8 % (ref 11.5–14.6)

## 2012-01-27 LAB — HEPATIC FUNCTION PANEL
ALT: 16 U/L (ref 0–35)
AST: 17 U/L (ref 0–37)
Bilirubin, Direct: 0.1 mg/dL (ref 0.0–0.3)
Total Protein: 7.1 g/dL (ref 6.0–8.3)

## 2012-01-27 LAB — LIPID PANEL
Cholesterol: 164 mg/dL (ref 0–200)
Total CHOL/HDL Ratio: 3
Triglycerides: 93 mg/dL (ref 0.0–149.0)

## 2012-01-27 LAB — BASIC METABOLIC PANEL
CO2: 27 mEq/L (ref 19–32)
Chloride: 102 mEq/L (ref 96–112)
Creatinine, Ser: 0.6 mg/dL (ref 0.4–1.2)

## 2012-01-27 NOTE — Progress Notes (Signed)
Quick Note:  Please make copy of labs for patient visit. ______ 

## 2012-01-28 ENCOUNTER — Encounter: Payer: Self-pay | Admitting: Cardiology

## 2012-01-28 ENCOUNTER — Ambulatory Visit (INDEPENDENT_AMBULATORY_CARE_PROVIDER_SITE_OTHER): Payer: Medicare Other | Admitting: Cardiology

## 2012-01-28 ENCOUNTER — Other Ambulatory Visit: Payer: Medicare Other

## 2012-01-28 VITALS — BP 118/78 | HR 80 | Ht 61.0 in | Wt 139.0 lb

## 2012-01-28 DIAGNOSIS — I119 Hypertensive heart disease without heart failure: Secondary | ICD-10-CM | POA: Diagnosis not present

## 2012-01-28 DIAGNOSIS — E876 Hypokalemia: Secondary | ICD-10-CM | POA: Diagnosis not present

## 2012-01-28 DIAGNOSIS — Z85820 Personal history of malignant melanoma of skin: Secondary | ICD-10-CM

## 2012-01-28 DIAGNOSIS — E7800 Pure hypercholesterolemia, unspecified: Secondary | ICD-10-CM

## 2012-01-28 DIAGNOSIS — M25579 Pain in unspecified ankle and joints of unspecified foot: Secondary | ICD-10-CM | POA: Diagnosis not present

## 2012-01-28 MED ORDER — SIMVASTATIN 20 MG PO TABS: 20.0000 mg | ORAL_TABLET | Freq: Every evening | ORAL | Status: DC

## 2012-01-28 NOTE — Patient Instructions (Signed)
Your physician recommends that you continue on your current medications as directed. Please refer to the Current Medication list given to you today.  Your physician recommends that you schedule a follow-up appointment in: 4 months with fasting labs (lp/bmet/hfp)   Increase your intake of bananas and prunes

## 2012-01-29 NOTE — Assessment & Plan Note (Signed)
Blood work this time reveals hypokalemia.  She would prefer to supplement her potassium with dietary means rather than pills.  We will have her increase her intake of prunes and bananas.

## 2012-01-29 NOTE — Assessment & Plan Note (Signed)
The patient has had no evidence of recurrence of her melanoma

## 2012-01-29 NOTE — Assessment & Plan Note (Signed)
Patient has hypercholesterolemia.  She is tolerating simvastatin 20 mg daily.  We reviewed her labs which are improving.  She is not having any myalgias from the simvastatin

## 2012-01-29 NOTE — Progress Notes (Signed)
[name] Date of Birth:  [birth year] [facility] [address] [place], [place]  [address] [phone]         Fax   [phone]  History of Present Illness: This pleasant 71 year old woman is seen on 01/28/12 for followup office visit she has a past history of essential hypertension.  She also has a past history of hypercholesterolemia and a past history of melanoma as well as thyroid cancer.  She has had previous atypical chest pain.  She had a normal nuclear stress test in 2006.  Current Outpatient Prescriptions  Medication Sig Dispense Refill  . Acetaminophen (TYLENOL 8 HOUR PO) Take by mouth as needed.        [name] albuterol (VENTOLIN HFA) 108 (90 BASE) MCG/ACT inhaler Inhale 2 puffs into the lungs every 6 (six) hours as needed.      . ALPRAZolam (XANAX) 0.5 MG tablet Take 0.5 mg by mouth daily as needed.      [name] amLODipine (NORVASC) 5 MG tablet Take 1 tablet (5 mg total) by mouth daily.  90 tablet  3  . aspirin 81 MG tablet Take 81 mg by mouth daily.        . calcium citrate-vitamin D (CITRACAL+D) 315-200 MG-UNIT per tablet Take 1 tablet by mouth 2 (two) times daily.       . famotidine (PEPCID) 20 MG tablet Take 1 tablet (20 mg total) by mouth at bedtime. One at bedtime  30 tablet  2  . fexofenadine (ALLEGRA) 180 MG tablet 1/2 tablet daily      . fluticasone (FLONASE) 50 MCG/ACT nasal spray Place 2 sprays into the nose daily as needed.       . Fluticasone-Salmeterol (ADVAIR) 250-50 MCG/DOSE AEPB Inhale 1 puff into the lungs 2 (two) times daily. As needed      . hydrochlorothiazide (HYDRODIURIL) 25 MG tablet Take 1 tablet (25 mg total) by mouth daily.  90 tablet  3  . levothyroxine (SYNTHROID, LEVOTHROID) 125 MCG tablet Take 125 mcg by mouth daily.        [name] omeprazole (PRILOSEC) 20 MG capsule Take 2   30-60 min before first meal of the day  30 capsule  2  . Probiotic Product (PROBIOTIC PO) Take by mouth daily.        [name] telmisartan (MICARDIS) 40 MG tablet Take 1  tablet (40 mg total) by mouth daily.  90 tablet  4  . traMADol (ULTRAM) 50 MG tablet Take 1 tablet (50 mg total) by mouth every 6 (six) hours as needed for pain. As needed  40 tablet  2  . simvastatin (ZOCOR) 20 MG tablet Take 1 tablet (20 mg total) by mouth every evening.  90 tablet  3    Allergies  Allergen Reactions  . Hydrocodone   . Levaquin   . Sudafed (Pseudoephedrine Hcl)   . Sulfur   . Zoloft     Patient Active Problem List  Diagnoses  . Benign hypertensive heart disease without heart failure  . Hypercholesterolemia  . Hypothyroidism  . History of melanoma  . Hx of thyroid cancer  . Chest pain, atypical  . Dizziness  . Cough  . MGUS (monoclonal gammopathy of unknown significance)  . Hypokalemia    History  Smoking status  . Never Smoker   Smokeless tobacco  . Never Used    History  Alcohol Use No    Family History  Problem Relation Age of Onset  . Stroke Mother   .  Asthma Father   . Diabetes Father   . Cancer Father   . Cancer Sister   . Diabetes Sister   . Cancer Brother     Review of Systems: Constitutional: no fever chills diaphoresis or fatigue or change in weight.  Head and neck: no hearing loss, no epistaxis, no photophobia or visual disturbance. Respiratory: No cough, shortness of breath or wheezing. Cardiovascular: No chest pain peripheral edema, palpitations. Gastrointestinal: No abdominal distention, no abdominal pain, no change in bowel habits hematochezia or melena. Genitourinary: No dysuria, no frequency, no urgency, no nocturia. Musculoskeletal:No arthralgias, no back pain, no gait disturbance or myalgias. Neurological: No dizziness, no headaches, no numbness, no seizures, no syncope, no weakness, no tremors. Hematologic: No lymphadenopathy, no easy bruising. Psychiatric: No confusion, no hallucinations, no sleep disturbance.    Physical Exam: Filed Vitals:   01/28/12 0943  BP: 118/78  Pulse: 80   the general appearance  reveals a well-developed well-nourished woman in no distress.The head and neck exam reveals pupils equal and reactive.  Extraocular movements are full.  There is no scleral icterus.  The mouth and pharynx are normal.  The neck is supple.  The carotids reveal no bruits.  The jugular venous pressure is normal.  The  thyroid is not enlarged.  There is no lymphadenopathy.  The chest is clear to percussion and auscultation.  There are no rales or rhonchi.  Expansion of the chest is symmetrical.  The precordium is quiet.  The first heart sound is normal.  The second heart sound is physiologically split.  There is no murmur gallop rub or click.  There is no abnormal lift or heave.  The abdomen is soft and nontender.  The bowel sounds are normal.  The liver and spleen are not enlarged.  There are no abdominal masses.  There are no abdominal bruits.  Extremities reveal good pedal pulses.  There is no phlebitis or edema.  There is no cyanosis or clubbing.  Strength is normal and symmetrical in all extremities.  There is no lateralizing weakness.  There are no sensory deficits.  The skin is warm and dry.  There is no rash.     Assessment / Plan: Continue same medication.  Supplement potassium with food items.  Recheck in 4 months for followup office visit and fasting lipid panel hepatic function panel and basal metabolic panel

## 2012-01-29 NOTE — Assessment & Plan Note (Signed)
The patient denies any recent chest pain or shortness of breath.  She has not had any palpitations.  He denies any dizziness or syncope.  She denies any symptoms of side effects from her medications

## 2012-01-31 DIAGNOSIS — M25579 Pain in unspecified ankle and joints of unspecified foot: Secondary | ICD-10-CM | POA: Diagnosis not present

## 2012-02-04 DIAGNOSIS — M818 Other osteoporosis without current pathological fracture: Secondary | ICD-10-CM | POA: Diagnosis not present

## 2012-02-04 DIAGNOSIS — E890 Postprocedural hypothyroidism: Secondary | ICD-10-CM | POA: Diagnosis not present

## 2012-02-04 DIAGNOSIS — C73 Malignant neoplasm of thyroid gland: Secondary | ICD-10-CM | POA: Diagnosis not present

## 2012-02-08 ENCOUNTER — Other Ambulatory Visit: Payer: Medicare Other | Admitting: Lab

## 2012-02-12 ENCOUNTER — Emergency Department (INDEPENDENT_AMBULATORY_CARE_PROVIDER_SITE_OTHER)
Admission: EM | Admit: 2012-02-12 | Discharge: 2012-02-12 | Disposition: A | Discharge status: Nursing Home (Includes ICF) | Payer: Medicare Other | Source: Home / Self Care | Attending: Emergency Medicine | Admitting: Emergency Medicine

## 2012-02-12 ENCOUNTER — Encounter (HOSPITAL_COMMUNITY): Payer: Self-pay

## 2012-02-12 ENCOUNTER — Encounter (HOSPITAL_COMMUNITY): Payer: Self-pay | Admitting: *Deleted

## 2012-02-12 ENCOUNTER — Emergency Department (HOSPITAL_COMMUNITY)
Admission: EM | Admit: 2012-02-12 | Discharge: 2012-02-13 | Disposition: A | Discharge status: Home / Self Care | Payer: Medicare Other | Attending: Emergency Medicine | Admitting: Emergency Medicine

## 2012-02-12 DIAGNOSIS — R0789 Other chest pain: Secondary | ICD-10-CM | POA: Insufficient documentation

## 2012-02-12 DIAGNOSIS — Z8739 Personal history of other diseases of the musculoskeletal system and connective tissue: Secondary | ICD-10-CM | POA: Insufficient documentation

## 2012-02-12 DIAGNOSIS — I129 Hypertensive chronic kidney disease with stage 1 through stage 4 chronic kidney disease, or unspecified chronic kidney disease: Secondary | ICD-10-CM | POA: Insufficient documentation

## 2012-02-12 DIAGNOSIS — Z79899 Other long term (current) drug therapy: Secondary | ICD-10-CM | POA: Insufficient documentation

## 2012-02-12 DIAGNOSIS — Z8673 Personal history of transient ischemic attack (TIA), and cerebral infarction without residual deficits: Secondary | ICD-10-CM | POA: Diagnosis not present

## 2012-02-12 DIAGNOSIS — R079 Chest pain, unspecified: Secondary | ICD-10-CM | POA: Diagnosis not present

## 2012-02-12 DIAGNOSIS — R42 Dizziness and giddiness: Secondary | ICD-10-CM | POA: Diagnosis not present

## 2012-02-12 DIAGNOSIS — E079 Disorder of thyroid, unspecified: Secondary | ICD-10-CM | POA: Diagnosis not present

## 2012-02-12 DIAGNOSIS — N189 Chronic kidney disease, unspecified: Secondary | ICD-10-CM | POA: Insufficient documentation

## 2012-02-12 DIAGNOSIS — R112 Nausea with vomiting, unspecified: Secondary | ICD-10-CM | POA: Insufficient documentation

## 2012-02-12 HISTORY — DX: Perforation of intestine (nontraumatic): K63.1

## 2012-02-12 HISTORY — DX: Peritonitis, unspecified: K65.9

## 2012-02-12 HISTORY — DX: Nonrheumatic mitral (valve) prolapse: I34.1

## 2012-02-12 HISTORY — DX: Anxiety disorder, unspecified: F41.9

## 2012-02-12 HISTORY — DX: Malignant melanoma of skin, unspecified: C43.9

## 2012-02-12 LAB — COMPREHENSIVE METABOLIC PANEL
Alkaline Phosphatase: 47 U/L (ref 39–117)
BUN: 14 mg/dL (ref 6–23)
Chloride: 99 mEq/L (ref 96–112)
GFR calc Af Amer: >90 mL/min (ref >90)
GFR calc non Af Amer: >90 mL/min (ref >90)
Glucose, Bld: 99 mg/dL (ref 70–99)
Potassium: 3.1 mEq/L — ABNORMAL LOW (ref 3.5–5.1)
Total Bilirubin: 0.8 mg/dL (ref 0.3–1.2)

## 2012-02-12 LAB — CBC
HCT: 37.2 % (ref 36.0–46.0)
Hemoglobin: 12.9 g/dL (ref 12.0–15.0)
MCH: 29.4 pg (ref 26.0–34.0)
RBC: 4.39 MIL/uL (ref 3.87–5.11)

## 2012-02-12 LAB — TROPONIN I
Troponin I: <0.3 ng/mL (ref <0.30)
Troponin I: <0.3 ng/mL (ref <0.30)

## 2012-02-12 LAB — DIFFERENTIAL
Lymphs Abs: 2.5 10*3/uL (ref 0.7–4.0)
Monocytes Absolute: 0.4 10*3/uL (ref 0.1–1.0)
Monocytes Relative: 5 % (ref 3–12)
Neutro Abs: 5.9 10*3/uL (ref 1.7–7.7)
Neutrophils Relative %: 66 % (ref 43–77)

## 2012-02-12 LAB — URINALYSIS, DIPSTICK ONLY
Leukocytes, UA: NEGATIVE
Nitrite: NEGATIVE
Specific Gravity, Urine: 1.015 (ref 1.005–1.030)
Urobilinogen, UA: 0.2 mg/dL (ref 0.0–1.0)

## 2012-02-12 MED ORDER — MECLIZINE HCL 25 MG PO TABS
25.0000 mg | ORAL_TABLET | Freq: Once | ORAL | Status: AC
  Administered 2012-02-12: 25 mg via ORAL
  Filled 2012-02-12: qty 1

## 2012-02-12 MED ORDER — POTASSIUM CHLORIDE CRYS ER 20 MEQ PO TBCR
40.0000 meq | EXTENDED_RELEASE_TABLET | Freq: Once | ORAL | Status: AC
  Administered 2012-02-12: 40 meq via ORAL
  Filled 2012-02-12: qty 2

## 2012-02-12 MED ORDER — SODIUM CHLORIDE 0.9 % IV BOLUS (SEPSIS)
1000.0000 mL | INTRAVENOUS | Status: AC
  Administered 2012-02-12: 1000 mL via INTRAVENOUS

## 2012-02-12 MED ORDER — ONDANSETRON HCL 4 MG/2ML IJ SOLN
4.0000 mg | Freq: Once | INTRAMUSCULAR | Status: AC
  Administered 2012-02-12: 4 mg via INTRAVENOUS
  Filled 2012-02-12: qty 2

## 2012-02-12 MED ORDER — MECLIZINE HCL 50 MG PO TABS: 25.0000 mg | ORAL_TABLET | Freq: Three times a day (TID) | ORAL | Status: AC | PRN

## 2012-02-12 NOTE — ED Notes (Signed)
Patient is AOx4 and comfortable with her discharge instructions. 

## 2012-02-12 NOTE — ED Notes (Signed)
Reports dizziness intermittently since Dec 2012 when she had the flu.  Has been doing a lot of activity the past 2 days, including mowing the lawn; today dizziness became much worse and was associated w/ nausea.  Took a Xanax and 81mg  ASA, then had emesis.  Did not have chest pain at time of emesis; later started w/ left chest pain, which is now pain across chest; chest is tender to palpation.  Neighbor states on-call MD recommended she go to [facility], or to call EMS if pt was not steady on her feet due to dizziness.  EMS evaluated pt and felt it may be R/T anxiety per neighbor, "because her fingertips were tingly, her heart felt like it was racing but it wasn't...".  States she is feeling better now, but is still very dizzy with any position changes of her head.  Vague description of chest pain, states, "it just hurts and feels like I want to be left alone".  Unsure if she feels SOB.  Patient talking rapidly and frequently.

## 2012-02-12 NOTE — ED Notes (Signed)
MD at bedside. 

## 2012-02-12 NOTE — ED Provider Notes (Signed)
71 year old female has been having difficulty with episodes of vertigo for the past 5 months. She has been evaluated by an ENT physician but states that she has not received any treatment for it. She did go to urgent care today and was transferred here because she had complained of some vague chest pain. She describes as a funny, running feeling in the left chest. She has been pain-free since arriving in the emergency department here. Her old records are viewed and she did have a negative nuclear stress test 7 years ago and she states that her cardiologist has been talking about possibly getting a repeat nuclear stress test in the near future. On exam, dizziness is reproduced by head movement. Lungs are clear and heart has regular rate and rhythm. His no chest wall tenderness. Vertigo and she seems clearly to be peripheral vertigo. She will be given a therapeutic trial of meclizine. ECG shows no evidence of cardiac injury. She will have 2 sets of cardiac markers checked.  ECG shows normal sinus rhythm with a rate of 65, no ectopy. Normal axis. Normal P wave. Normal QRS. Normal intervals. Normal ST and T waves. Impression: normal ECG. When compared with ECG of 11/08/2006, no significant changes are seen.   Dione Booze, MD 02/12/12 2007

## 2012-02-12 NOTE — ED Provider Notes (Signed)
History     CSN: [id number]  Arrival date & time 02/12/12  1646   First MD Initiated Contact with Patient 02/12/12 1652      Chief Complaint  Patient presents with  . Dizziness  . Chest Pain  . Nausea    (Consider location/radiation/quality/duration/timing/severity/associated sxs/prior treatment) HPI Comments: Patient describes she has been doing some work out in her yard as she has been progressively weak and dizzy. She has vomited a couple times and had been having chest pains on and off in the course of the day. One point during the course of the day she called EMS they went to initial assessment which described to her that she was  Having symptoms possibly related to positional vertigo but she didn't want to go to the hospital. she did want to get exposed to other patients with other infections.  Patient feels nauseous having anterior chest discomfort and feels like both lower upper extremities are numb and tingling. Denies any extremity weakness.  Patient is a 71 y.o. female presenting with chest pain. The history is provided by the patient and a relative.  Chest Pain Primary symptoms include a fever, fatigue and dizziness. Pertinent negatives for primary symptoms include no shortness of breath, no cough and no palpitations.  Associated symptoms include numbness.     Past Medical History  Diagnosis Date  . 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 Cancer     THYROID, SKIN, NOSE  . History of blood clots     LEG  . Stroke   . Hernia   . MGUS (monoclonal gammopathy of unknown significance)   . Chronic kidney disease   . Hypertension   . Hyperlipidemia   . Thyroid disease   . Arthritis   . Melanoma   . Mitral valve prolapse   . Anxiety   . Perforated bowel   . Peritonitis     Past Surgical History  Procedure Date  . Abdominal hysterectomy   . Rotator cuff repair 2004    RIGHT SHOULDER  . Hernia repair   . Leg surgery     VEIN & BLOOD CLOT  . Cardiovascular  stress test 2006    NORMAL  . Transthoracic echocardiogram 2006    Family History  Problem Relation Age of Onset  . Stroke Mother   . Asthma Father   . Diabetes Father   . Cancer Father   . Cancer Sister   . Diabetes Sister   . Cancer Brother     History  Substance Use Topics  . Smoking status: Never Smoker   . Smokeless tobacco: Never Used  . Alcohol Use: No    OB History    Grav Para Term Preterm Abortions TAB SAB Ect Mult Living                  Review of Systems  Constitutional: Positive for fever, appetite change and fatigue.  Respiratory: Positive for chest tightness. Negative for cough, choking and shortness of breath.   Cardiovascular: Positive for chest pain. Negative for palpitations and leg swelling.  Neurological: Positive for dizziness, light-headedness, numbness and headaches. Negative for tremors.    Allergies  Hydrocodone; Levofloxacin; Sertraline hcl; Sudafed; and Sulfa antibiotics  Home Medications   Current Outpatient Rx  Name Route Sig Dispense Refill  . TYLENOL 8 HOUR PO Oral Take by mouth as needed.      . ALBUTEROL SULFATE HFA 108 (90 BASE) MCG/ACT IN AERS Inhalation  Inhale 2 puffs into the lungs every 6 (six) hours as needed.    . ALPRAZOLAM 0.5 MG PO TABS Oral Take 0.5 mg by mouth daily as needed.    [name] AMLODIPINE BESYLATE 5 MG PO TABS Oral Take 1 tablet (5 mg total) by mouth daily. 90 tablet 3  . ASPIRIN 81 MG PO TABS Oral Take 81 mg by mouth daily.      [name] CALCIUM CITRATE-VITAMIN D 315-200 MG-UNIT PO TABS Oral Take 1 tablet by mouth 2 (two) times daily.     [name] FAMOTIDINE 20 MG PO TABS Oral Take 1 tablet (20 mg total) by mouth at bedtime. One at bedtime 30 tablet 2  . FEXOFENADINE HCL 180 MG PO TABS  1/2 tablet daily    . HYDROCHLOROTHIAZIDE 25 MG PO TABS Oral Take 1 tablet (25 mg total) by mouth daily. 90 tablet 3  . LEVOTHYROXINE SODIUM 125 MCG PO TABS Oral Take 125 mcg by mouth daily.      [name] OMEPRAZOLE 20 MG PO CPDR  Take 2   30-60 min  before first meal of the day 30 capsule 2  . SIMVASTATIN 20 MG PO TABS Oral Take 1 tablet (20 mg total) by mouth every evening. 90 tablet 3    PATIENT WOULD LIKE TO PAY CASH ON GENERIC PLAN AND .[name].  . TELMISARTAN 40 MG PO TABS Oral Take 1 tablet (40 mg total) by mouth daily. 90 tablet 4  . FLUTICASONE PROPIONATE 50 MCG/ACT NA SUSP Nasal Place 2 sprays into the nose daily as needed.     [name] FLUTICASONE-SALMETEROL 250-50 MCG/DOSE IN AEPB Inhalation Inhale 1 puff into the lungs 2 (two) times daily. As needed    . PROBIOTIC PO Oral Take by mouth daily.      . TRAMADOL HCL 50 MG PO TABS Oral Take 1 tablet (50 mg total) by mouth every 6 (six) hours as needed for pain. As needed 40 tablet 2    BP 166/99  Pulse 72  Temp(Src) 97.6 F (36.4 C) (Oral)  Resp 19  SpO2 98%  Physical Exam  Nursing note and vitals reviewed. Constitutional: She is oriented to person, place, and time.  HENT:  Head: Normocephalic.  Eyes: Conjunctivae are normal.  Neck: Neck supple. No JVD present.  Cardiovascular: Normal rate.   Pulmonary/Chest: Effort normal and breath sounds normal.  Neurological: She is alert and oriented to person, place, and time. She is not disoriented. She displays no tremor. No cranial nerve deficit or sensory deficit. She exhibits normal muscle tone.  Skin: She is not diaphoretic.    ED Course  Procedures (including critical care time)  Labs Reviewed - No data to display No results found.   1. Dizziness   2. Chest pain       MDM  Show multiple symptoms including positional vertigo, chest pains, vomiting, upper extremity paresthesias. Patient is describing a mixture of chronic ongoing symptoms with the onset of severe dizziness with upper extremity paresthesias and anterior chest pain. Patient was noted to be stable mild hypertension. In no obvious neuromuscular deficiencies during initial assessment. EKG was normal sinus rhythm ventricular rate in the 70s. No interval segment  abnormalities. Patient Describing in term attempt chest pains and positional vertigo. Patient was transferred to the emergency department for further evaluation.        Jimmie Molly, MD 02/12/12 1731

## 2012-02-12 NOTE — Discharge Instructions (Signed)
Dizziness Dizziness is a common problem. It is a feeling of unsteadiness or lightheadedness. You may feel like you are about to faint. Dizziness can lead to injury if you stumble or fall. A person of any age group can suffer from dizziness, but dizziness is more common in older adults. CAUSES  Dizziness can be caused by many different things, including:  Middle ear problems.   Standing for too long.   Infections.   An allergic reaction.   Aging.   An emotional response to something, such as the sight of blood.   Side effects of medicines.   Fatigue.   Problems with circulation or blood pressure.   Excess use of alcohol, medicines, or illegal drug use.   Breathing too fast (hyperventilation).   An arrhythmia or problems with your heart rhythm.   Low red blood cell count (anemia).   Pregnancy.   Vomiting, diarrhea, fever, or other illnesses that cause dehydration.   Diseases or conditions such as Parkinson's disease, high blood pressure (hypertension), diabetes, and thyroid problems.   Exposure to extreme heat.  DIAGNOSIS  To find the cause of your dizziness, your caregiver may do a physical exam, lab tests, radiologic imaging scans, or an electrocardiography test (ECG).  TREATMENT  Treatment of dizziness depends on the cause of your symptoms and can vary greatly. HOME CARE INSTRUCTIONS   Drink enough fluids to keep your urine clear or pale yellow. This is especially important in very hot weather. In the elderly, it is also important in cold weather.   If your dizziness is caused by medicines, take them exactly as directed. When taking blood pressure medicines, it is especially important to get up slowly.   Rise slowly from chairs and steady yourself until you feel okay.   In the morning, first sit up on the side of the bed. When this seems okay, stand slowly while holding onto something until you know your balance is fine.   If you need to stand in one place for a  long time, be sure to move your legs often. Tighten and relax the muscles in your legs while standing.   If dizziness continues to be a problem, have someone stay with you for a day or two. Do this until you feel you are well enough to stay alone. Have the person call your caregiver if he or she notices changes in you that are concerning.   Do not drive or use heavy machinery if you feel dizzy.  SEEK IMMEDIATE MEDICAL CARE IF:   Your dizziness or lightheadedness gets worse.   You feel nauseous or vomit.   You develop problems with talking, walking, weakness, or using your arms, hands, or legs.   You are not thinking clearly or you have difficulty forming sentences. It may take a friend or family member to determine if your thinking is normal.   You develop chest pain, abdominal pain, shortness of breath, or sweating.   Your vision changes.   You notice any bleeding.   You have side effects from medicine that seems to be getting worse rather than better.  MAKE SURE YOU:   Understand these instructions.   Will watch your condition.   Will get help right away if you are not doing well or get worse.  Document Released: 03/16/2001 Document Revised: 09/09/2011 Document Reviewed: 04/09/2011 Exit[employer] Patient Information 2012 [employer].Dizziness Dizziness is a common problem. It is a feeling of unsteadiness or lightheadedness. You may feel like you are about   to faint. Dizziness can lead to injury if you stumble or fall. A person of any age group can suffer from dizziness, but dizziness is more common in older adults. CAUSES  Dizziness can be caused by many different things, including:  Middle ear problems.   Standing for too long.   Infections.   An allergic reaction.   Aging.   An emotional response to something, such as the sight of blood.   Side effects of medicines.   Fatigue.   Problems with circulation or blood pressure.   Excess use of alcohol, medicines, or  illegal drug use.   Breathing too fast (hyperventilation).   An arrhythmia or problems with your heart rhythm.   Low red blood cell count (anemia).   Pregnancy.   Vomiting, diarrhea, fever, or other illnesses that cause dehydration.   Diseases or conditions such as Parkinson's disease, high blood pressure (hypertension), diabetes, and thyroid problems.   Exposure to extreme heat.  DIAGNOSIS  To find the cause of your dizziness, your caregiver may do a physical exam, lab tests, radiologic imaging scans, or an electrocardiography test (ECG).  TREATMENT  Treatment of dizziness depends on the cause of your symptoms and can vary greatly. HOME CARE INSTRUCTIONS   Drink enough fluids to keep your urine clear or pale yellow. This is especially important in very hot weather. In the elderly, it is also important in cold weather.   If your dizziness is caused by medicines, take them exactly as directed. When taking blood pressure medicines, it is especially important to get up slowly.   Rise slowly from chairs and steady yourself until you feel okay.   In the morning, first sit up on the side of the bed. When this seems okay, stand slowly while holding onto something until you know your balance is fine.   If you need to stand in one place for a long time, be sure to move your legs often. Tighten and relax the muscles in your legs while standing.   If dizziness continues to be a problem, have someone stay with you for a day or two. Do this until you feel you are well enough to stay alone. Have the person call your caregiver if he or she notices changes in you that are concerning.   Do not drive or use heavy machinery if you feel dizzy.  SEEK IMMEDIATE MEDICAL CARE IF:   Your dizziness or lightheadedness gets worse.   You feel nauseous or vomit.   You develop problems with talking, walking, weakness, or using your arms, hands, or legs.   You are not thinking clearly or you have  difficulty forming sentences. It may take a friend or family member to determine if your thinking is normal.   You develop chest pain, abdominal pain, shortness of breath, or sweating.   Your vision changes.   You notice any bleeding.   You have side effects from medicine that seems to be getting worse rather than better.  MAKE SURE YOU:   Understand these instructions.   Will watch your condition.   Will get help right away if you are not doing well or get worse.  Document Released: 03/16/2001 Document Revised: 09/09/2011 Document Reviewed: 04/09/2011 Exit[employer] Patient Information 2012 [employer]. 

## 2012-02-12 NOTE — ED Notes (Signed)
Pt. Sent to [place] from our Urgent care for intermittent dizziness .  EKG done at urgent care was NSR, pt. Is having no sob or pain

## 2012-02-12 NOTE — ED Notes (Signed)
PT repositioned. Pt states that earlier in the day when being picked up by EMS she was unable to make out the face of one individual. PT states that "this has never happened before in my life". PT states that she also felt as though she would pass out. Pt concerned that this was "of great importance".

## 2012-02-12 NOTE — ED Provider Notes (Signed)
History     CSN: [id number]  Arrival date & time 02/12/12  1739   First MD Initiated Contact with Patient 02/12/12 1834      Chief Complaint  Patient presents with  . Dizziness    (Consider location/radiation/quality/duration/timing/severity/associated sxs/prior treatment) Patient is a 71 y.o. female presenting with neurologic complaint. The history is provided by the patient.  Neurologic Problem The primary symptoms include dizziness, nausea and vomiting. Primary symptoms do not include headaches or fever. Episode onset: December 2012, worse today. The symptoms are improving. The neurological symptoms are focal. Context: while walking in [place].  Description: imbalance. The dizziness began today. The dizziness has been gradually improving since its onset. Associated with: movement. Dizziness also occurs with nausea and vomiting.  Additional symptoms do not include neck stiffness.    Past Medical History  Diagnosis Date  . 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 Cancer     THYROID, SKIN, NOSE  . History of blood clots     LEG  . Stroke   . Hernia   . MGUS (monoclonal gammopathy of unknown significance)   . Chronic kidney disease   . Hypertension   . Hyperlipidemia   . Thyroid disease   . Arthritis   . Melanoma   . Mitral valve prolapse   . Anxiety   . Perforated bowel   . Peritonitis     Past Surgical History  Procedure Date  . Abdominal hysterectomy   . Rotator cuff repair 2004    RIGHT SHOULDER  . Hernia repair   . Leg surgery     VEIN & BLOOD CLOT  . Cardiovascular stress test 2006    NORMAL  . Transthoracic echocardiogram 2006    Family History  Problem Relation Age of Onset  . Stroke Mother   . Asthma Father   . Diabetes Father   . Cancer Father   . Cancer Sister   . Diabetes Sister   . Cancer Brother     History  Substance Use Topics  . Smoking status: Never Smoker   . Smokeless tobacco: Never Used  . Alcohol Use: No    OB History    Grav Para Term Preterm Abortions TAB SAB Ect Mult Living                  Review of Systems  Constitutional: Negative for fever and fatigue.  HENT: Negative for congestion, drooling, neck pain and neck stiffness.   Eyes: Negative for pain.  Respiratory: Negative for cough and shortness of breath.   Cardiovascular: Negative for chest pain.  Gastrointestinal: Positive for nausea and vomiting. Negative for abdominal pain and diarrhea.  Genitourinary: Negative for dysuria and hematuria.  Musculoskeletal: Negative for back pain and gait problem.  Skin: Negative for color change.  Neurological: Positive for dizziness. Negative for headaches.  Hematological: Negative for adenopathy.  Psychiatric/Behavioral: Negative for behavioral problems.  All other systems reviewed and are negative.    Allergies  Hydrocodone; Levofloxacin; Sertraline hcl; and Sudafed  Home Medications   Current Outpatient Rx  Name Route Sig Dispense Refill  . ACETAMINOPHEN ER 650 MG PO TBCR Oral Take 650 mg by mouth every 8 (eight) hours as needed. For pain    . ALBUTEROL SULFATE HFA 108 (90 BASE) MCG/ACT IN AERS Inhalation Inhale 2 puffs into the lungs every 6 (six) hours as needed. For shortness of breath    . ALPRAZOLAM 0.5 MG PO TABS Oral Take 0.5 mg  by mouth daily as needed.    [name] AMLODIPINE BESYLATE 5 MG PO TABS Oral Take 1 tablet (5 mg total) by mouth daily. 90 tablet 3  . ASPIRIN 81 MG PO TABS Oral Take 81 mg by mouth daily.      [name] CALCIUM CITRATE-VITAMIN D 315-200 MG-UNIT PO TABS Oral Take 1 tablet by mouth 2 (two) times daily.     [name] FAMOTIDINE 20 MG PO TABS Oral Take 1 tablet (20 mg total) by mouth at bedtime. One at bedtime 30 tablet 2  . FEXOFENADINE HCL 180 MG PO TABS Oral Take 90 mg by mouth daily.     [name] FLUTICASONE PROPIONATE 50 MCG/ACT NA SUSP Nasal Place 2 sprays into the nose daily as needed. For dry nasal passages    . FLUTICASONE-SALMETEROL 250-50 MCG/DOSE IN AEPB Inhalation Inhale 1 puff into the  lungs 2 (two) times daily. As needed    . HYDROCHLOROTHIAZIDE 25 MG PO TABS Oral Take 1 tablet (25 mg total) by mouth daily. 90 tablet 3  . LEVOTHYROXINE SODIUM 125 MCG PO TABS Oral Take 125 mcg by mouth daily.      [name] OMEPRAZOLE 20 MG PO CPDR Oral Take 40 mg by mouth daily. 30-60 min before first meal of the day    . PROBIOTIC PO Oral Take 1 capsule by mouth daily.     [name] SIMVASTATIN 20 MG PO TABS Oral Take 1 tablet (20 mg total) by mouth every evening. 90 tablet 3    PATIENT WOULD LIKE TO PAY CASH ON GENERIC PLAN AND .[name].  . TELMISARTAN 40 MG PO TABS Oral Take 1 tablet (40 mg total) by mouth daily. 90 tablet 4  . TRAMADOL HCL 50 MG PO TABS Oral Take 50 mg by mouth every 6 (six) hours as needed. For pain    . MECLIZINE HCL 50 MG PO TABS Oral Take 0.5 tablets (25 mg total) by mouth 3 (three) times daily as needed. 30 tablet 0    BP 110/68  Pulse 70  Temp(Src) 97.7 F (36.5 C) (Oral)  Resp 16  Ht 5\' 1"  (1.549 m)  Wt 134 lb 8 oz (61.009 kg)  BMI 25.41 kg/m2  SpO2 99%  Physical Exam  Constitutional: She is oriented to person, place, and time. She appears well-developed and well-nourished.  HENT:  Head: Normocephalic.  Mouth/Throat: No oropharyngeal exudate.  Eyes: Conjunctivae and EOM are normal. Pupils are equal, round, and reactive to light.  Neck: Normal range of motion. Neck supple.  Cardiovascular: Normal rate, regular rhythm, normal heart sounds and intact distal pulses.  Exam reveals no gallop and no friction rub.   No murmur heard. Pulmonary/Chest: Effort normal and breath sounds normal. No respiratory distress. She has no wheezes.  Abdominal: Soft. Bowel sounds are normal. There is no tenderness.  Musculoskeletal: Normal range of motion. She exhibits no edema and no tenderness.  Neurological: She is alert and oriented to person, place, and time. She has normal strength. No cranial nerve deficit or sensory deficit. Coordination and gait normal.       Negative romberg.  Skin:  Skin is warm and dry.  Psychiatric: She has a normal mood and affect. Her behavior is normal.    ED Course  Procedures (including critical care time)  Labs Reviewed  COMPREHENSIVE METABOLIC PANEL - Abnormal; Notable for the following:    Potassium 3.1 (*)    All other components within normal limits  URINALYSIS, DIPSTICK ONLY - Abnormal; Notable for the following:  Ketones, ur 15 (*)    All other components within normal limits  CBC  DIFFERENTIAL  TROPONIN I  TROPONIN I   No results found.   1. Dizziness   2. Atypical chest pain       MDM  11:53 PM 71 y.o. female w hx of CVA, melanoma/thryoid cancer per pt, MGUS pw chronic dizziness since December 2012 which worsened today and has become constant now. Pt had several episodes of emesis, and mild atypical cp which lasted approx 1 hr. Pt seen at urgent care, sent here for eval. Pt AFVSS here, feeling better on exam, only mild dizziness now, denies cp. Neurologically intact on exam, will get labs, IVF, will give meclizine. Pt does have hx of atypical chest pain. Low suspicion for ACS, will get delta troponin.   11:53 PM: Pt had mild relief w/ meclizine. Also gave po potassium d/t 3.1 potassium here. Delta trop negative. Pt continues to appear well on exam, and is ambulatory. Will d/c to home.  I have discussed the diagnosis/risks/treatment options with the patient and believe the pt to be eligible for discharge home to follow-up with pcp in 2-3 days if no better. We also discussed returning to the ED immediately if new or worsening sx occur. We discussed the sx which are most concerning (e.g., worsening cp, worsening dizziness) that necessitate immediate return. Any new prescriptions provided to the patient are listed below.  New Prescriptions   MECLIZINE (ANTIVERT) 50 MG TABLET    Take 0.5 tablets (25 mg total) by mouth 3 (three) times daily as needed.   Clinical Impression 1. Dizziness   2. Atypical chest pain             Purvis Sheffield, MD 02/13/12 0040

## 2012-02-13 NOTE — ED Provider Notes (Signed)
I saw and evaluated the patient, reviewed the resident's note and I agree with the findings and plan.   Nickalus Thornsberry, MD 02/13/12 0107 

## 2012-02-14 ENCOUNTER — Telehealth: Payer: Self-pay | Admitting: Cardiology

## 2012-02-14 ENCOUNTER — Ambulatory Visit: Payer: Medicare Other | Admitting: Adult Health

## 2012-02-14 DIAGNOSIS — E876 Hypokalemia: Secondary | ICD-10-CM

## 2012-02-14 MED ORDER — SPIRONOLACTONE 25 MG PO TABS: 25.0000 mg | ORAL_TABLET | Freq: Every day | ORAL | Status: DC

## 2012-02-14 NOTE — Telephone Encounter (Signed)
Okay to leave off the hydrochlorothiazide and just be very careful with dietary salt intake.

## 2012-02-14 NOTE — Telephone Encounter (Signed)
Called patient and she stated she has been watching her sodium intake and she continues to have some swelling.  Is very concerned about the swelling.  Discussed with  Dr. Patty Sermons and will have patient start Aldactone 25 mg daily and check labs end of the week.  Advised patient, verbalized understanding

## 2012-02-14 NOTE — Telephone Encounter (Signed)
Nausea, vomiting, and dizziness and was admitted over the weekend.  Has had low potassium but was lower in the hospital.  Is concerned about taking the HCTZ with low potassium.  Advised would forward to  Dr. Patty Sermons for review.

## 2012-02-14 NOTE — Telephone Encounter (Signed)
New message:  Pt called and wants you to call her back regarding the medication you wanted her to start taking.  She is very anxious.

## 2012-02-14 NOTE — Telephone Encounter (Signed)
Please return call to patient at [phone]  Patient was in the hospital over the weekend and needs to discuss meds.

## 2012-02-15 NOTE — Telephone Encounter (Signed)
New Problem:     Patient is still having complications with her new medication.  Please call back.

## 2012-02-15 NOTE — Telephone Encounter (Signed)
After she took Spironolactone yesterday and this morning she had a horrible burning in her stomach and even feels like in her eyes.  She is very concerned coming from the medication.  Will forward to  Dr. Patty Sermons for review

## 2012-02-15 NOTE — Telephone Encounter (Signed)
Stop the Aldactone and mark her chart allergic to Aldactone.  If she has more swelling she can go back to the hydrochlorothiazide and just take plenty of extra potassium

## 2012-02-15 NOTE — Telephone Encounter (Signed)
Discussed further with  Dr. Patty Sermons and will have patient start back on 1/2 of HCTZ and will call if increased swelling.  Will continue to eat high potassium foods.  Advised patient and cancelled labs for this week

## 2012-02-16 DIAGNOSIS — L259 Unspecified contact dermatitis, unspecified cause: Secondary | ICD-10-CM | POA: Diagnosis not present

## 2012-02-16 DIAGNOSIS — J3089 Other allergic rhinitis: Secondary | ICD-10-CM | POA: Diagnosis not present

## 2012-02-17 DIAGNOSIS — N390 Urinary tract infection, site not specified: Secondary | ICD-10-CM | POA: Diagnosis not present

## 2012-02-17 DIAGNOSIS — L259 Unspecified contact dermatitis, unspecified cause: Secondary | ICD-10-CM | POA: Diagnosis not present

## 2012-02-18 ENCOUNTER — Ambulatory Visit (INDEPENDENT_AMBULATORY_CARE_PROVIDER_SITE_OTHER): Payer: Medicare Other | Admitting: Adult Health

## 2012-02-18 ENCOUNTER — Encounter: Payer: Self-pay | Admitting: Adult Health

## 2012-02-18 VITALS — BP 114/60 | HR 77 | Temp 97.0°F | Ht 61.0 in | Wt 137.0 lb

## 2012-02-18 DIAGNOSIS — R059 Cough, unspecified: Secondary | ICD-10-CM

## 2012-02-18 DIAGNOSIS — R05 Cough: Secondary | ICD-10-CM | POA: Diagnosis not present

## 2012-02-18 NOTE — Progress Notes (Signed)
Reviewed. Agree with assessment and plan 

## 2012-02-18 NOTE — Progress Notes (Signed)
Subjective:    Patient ID: [name], female    DOB: 18-Dec-1940    MRN: [id number]    Brief patient profile:  17 yowf never smoker with ? H/o asthma/ allergies to dust mold relatively well controlled since 2010 on advair and cough meds as needed but no maintenance rx previous to 2010 then  persistent cough since Sep 21 2011 while on advair and referred by Dr Clarene Duke 10/21/2011 for pulmonary evaluation.   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  Started advair >  no benefit. Sob mostly with coughing but some better p ventolin.  No lateralizing pleuritic cp, no ex cp. Prev cough w/u in 2002 in pulmonary clinic by Gonzalez/Young with dx of ? Asthma/ ? gerd with uacs. >>imp was uacs, rec d/c advari and rx  w/ cough suprression w/ delsym/tramadol , steroid taper, and PPI/Pepcid combo.    11/04/2011 NP Follow up   Last ov started on cough suppression regimen with GERD prevention. CXR with no acute process. Cough is better but not completely gone.  Stopped her cough meds and PPI 2 days ago.  She has a lot of stress as she has upcoming Nose surgery for melanoma.  No discolored mucus or fever.  No chest pain or edema.  >>cont on PPI /pepcid , cough suppression rx   2/7/2013NP  Follow up  Pt is here to have check up prior to leaving on vacation . She is still doing well with less cough but developed sore throat yesterday. Had switched from allegra to zyrtec however was unable to tolerate  Zyrtec . She is now back on allegra. No increased cough, wheezing or fever. rec Use Delsym 2 tsp Twice daily  Until cough is gone.  May use Tramadol 50mg  in addition to help with cough -take 1 every 4 hr As needed   May use Allegra daily for drainage.  Take  Prilosec daily before meal  Take  Pepcid 20mg  At bedtime     12/09/11 ov/Sood ? [facility] Peers rx Zithromax 250 mg>>2 pills on first day,  then one pill daily for 4 days Advair one puff twice per day until sample completed Ventolin two puffs as needed for cough, wheeze, or chest congestion  12/21/2011 f/u ov/Wert  Cc only some better >  failed to answer a single question asked in a straightforward manner, tending to go off on tangents or answer questions with ambiguous medical terms or diagnoses and seemed aggravated/perplexed/ bewildered  when asked the same question more than once for clarification. Apparently cough resolved to her satisfaction but now has sob and sorethroat back on advair which I rec stopping in January with ? Benefit (certainly no increase in asthma symptoms or need for saba >>REC: steroid taper, stop advair and Prilosec 40mg  and Pepcid   01/04/2012 Follow up and Med review  Returns for 2 week follow up . Today. We reviewed all her medications and organize them into a medication calendar with patient education. It does appear that she is taking her medications correctly. She says that her cough has decreased some however, has not totally resolved. Feels that tramadol does help. It makes her sleepy. She did not have flare of cough or wheezing. Since stopping Advair. She denies any hemoptysis, chest pain, orthopnea, PND, or leg swelling. She says that she is under normal cement stress, as she's  had several cancer diagnosis. Over the last few years. >med calendar   02/18/2012 Follow up  Returns for follow up . Cough has totally resolved.  She does have several other medical problems that she is dealing with -dizziness, nose cancer , skin trouble, and GI issues  Remains off Advair . Rare use of albuterol .  No hemotpysis , weight loss or chest pain . [name]     ROS:  Constitutional:   No  weight loss, night sweats,  Fevers, chills,  +fatigue, or  lassitude.  HEENT:   No headaches,  Difficulty swallowing,  Tooth/dental problems, or  Sore throat,                No sneezing, itching, ear ache, nasal congestion, post nasal  drip,   CV:  No chest pain,  Orthopnea, PND, swelling in lower extremities, anasarca, dizziness, palpitations, syncope.   GI  +heartburn, indigestion,  No abdominal pain, nausea, vomiting, diarrhea, change in bowel habits, loss of appetite, bloody stools.   Resp:    No coughing up of blood.  No change in color of mucus.  No wheezing.  No chest wall deformity  Skin: no rash or lesions.  GU: no dysuria, change in color of urine, no urgency or frequency.  No flank pain, no hematuria   MS:  No joint pain or swelling.  No decreased range of motion.  No back pain.  Psych:  No change in mood or affect. No depression or anxiety.  No memory loss.                    Objective:   Physical Exam  Hoarse amb wf nad  Wt  144 10/21/2011>144 11/04/2011 >144 11/11/2011 > 12/21/2011  143 >>142 01/04/2012 >>137 02/18/2012   HEENT: nl dentition, turbinates, and orophanx. Nl external ear canals without cough reflex   NECK :  without JVD/Nodes/TM/ nl carotid upstrokes bilaterally   LUNGS: no acc muscle use, clear to A and P bilaterally without cough on insp or exp maneuvers   CV:  RRR  no s3 or murmur or increase in P2, no edema   ABD:  soft and nontender with nl excursion in the supine position. No bruits or organomegaly, bowel sounds nl  MS:  warm without deformities, calf tenderness, cyanosis or clubbing  SKIN: warm and dry without lesions    NEURO:  alert, approp, no deficits        Assessment & Plan:

## 2012-02-18 NOTE — Assessment & Plan Note (Signed)
Cough is much improved with tx aimed at trigger control of GERD and AR  May d/c pepcid  follow up in 3 months and As needed

## 2012-02-18 NOTE — Patient Instructions (Addendum)
May stop Pepcid  Follow up Dr. Craige Cotta in 2-3  months and As needed   .

## 2012-02-21 ENCOUNTER — Institutional Professional Consult (permissible substitution): Payer: Medicare Other | Admitting: Pulmonary Disease

## 2012-02-29 ENCOUNTER — Other Ambulatory Visit (HOSPITAL_BASED_OUTPATIENT_CLINIC_OR_DEPARTMENT_OTHER): Payer: Medicare Other | Admitting: Lab

## 2012-02-29 DIAGNOSIS — D472 Monoclonal gammopathy: Secondary | ICD-10-CM | POA: Diagnosis not present

## 2012-02-29 LAB — CBC WITH DIFFERENTIAL/PLATELET
BASO%: 0.7 % (ref 0.0–2.0)
EOS%: 2.6 % (ref 0.0–7.0)
HCT: 38 % (ref 34.8–46.6)
MCH: 29.7 pg (ref 25.1–34.0)
MCHC: 34.2 g/dL (ref 31.5–36.0)
NEUT%: 54.4 % (ref 38.4–76.8)
RDW: 13 % (ref 11.2–14.5)
lymph#: 1.9 10*3/uL (ref 0.9–3.3)

## 2012-03-01 LAB — COMPREHENSIVE METABOLIC PANEL
AST: 16 U/L (ref 0–37)
Albumin: 4.5 g/dL (ref 3.5–5.2)
Alkaline Phosphatase: 44 U/L (ref 39–117)
BUN: 11 mg/dL (ref 6–23)
Potassium: 4.1 mEq/L (ref 3.5–5.3)

## 2012-03-01 LAB — KAPPA/LAMBDA LIGHT CHAINS: Kappa:Lambda Ratio: 0 — ABNORMAL LOW (ref 0.26–1.65)

## 2012-03-01 LAB — IGG, IGA, IGM
IgA: 20 mg/dL — ABNORMAL LOW (ref 69–380)
IgG (Immunoglobin G), Serum: 1570 mg/dL (ref 690–1700)
IgM, Serum: 18 mg/dL — ABNORMAL LOW (ref 52–322)

## 2012-03-07 ENCOUNTER — Ambulatory Visit (HOSPITAL_BASED_OUTPATIENT_CLINIC_OR_DEPARTMENT_OTHER): Payer: Medicare Other | Admitting: Oncology

## 2012-03-07 ENCOUNTER — Telehealth: Payer: Self-pay | Admitting: *Deleted

## 2012-03-07 VITALS — BP 133/74 | HR 62 | Temp 97.9°F | Ht 61.0 in | Wt 138.3 lb

## 2012-03-07 DIAGNOSIS — Z85850 Personal history of malignant neoplasm of thyroid: Secondary | ICD-10-CM

## 2012-03-07 DIAGNOSIS — Z85820 Personal history of malignant melanoma of skin: Secondary | ICD-10-CM | POA: Diagnosis not present

## 2012-03-07 DIAGNOSIS — D472 Monoclonal gammopathy: Secondary | ICD-10-CM | POA: Diagnosis not present

## 2012-03-07 NOTE — Telephone Encounter (Signed)
gave patient appointment for 07-2012 lab only 01-2013 with the lab one week before the md appointment printed out calendar and gave to the patient

## 2012-03-07 NOTE — Progress Notes (Signed)
ID: [name]   DOB: [birth year]  MR#: [id number]  CSN#:[id number]  HISTORY OF PRESENT ILLNESS: The patient was evaluated by Dr. Darrick Penna for a history of hematuria, previously evaluated by Bjorn Pippin.  There is a history of multiple urinary tract infections and interstitial cystitis, but no history of stones or family history of hematuria.  Dr. Darrick Penna suspects this could be interstitial or papillary related to nonsteroidals and aspirin, but he is concerned that this could be related to one of the patient's cancers.  As part of his workup, he obtained serum protein electrophoresis, which showed an M-spike of 800 mg.  A total immunoglobulin G was 1374, total A was 46, and total M was 39.  Immunofixation showed IgG lambda specificity for the monoclonal component.  With this information, the patient was referred for further evaluation and treatment. 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   REVIEW OF SYSTEMS: She tells me she has been very dizzy, and feels that the pressure in her right I may be increasing. She was recently at [facility] and had a cancer removed from her nose. I do not have those records. She's had a persistent flu, moderate fatigue, insomnia, runny nose, sinus problems, hoarseness, irregular heartbeat, shortness of breath when walking up stairs, history of urinary infections, some arthritis symptoms, forgetfulness, anxiety, but no depression symptoms. She has had no fever, rash, or bleeding problems.  PAST MEDICAL HISTORY: Past Medical History  Diagnosis Date  . 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 Cancer     THYROID, SKIN, NOSE  . History of blood clots     LEG  . Stroke   . Hernia   . MGUS (monoclonal gammopathy of unknown significance)   . Chronic kidney disease   . Hypertension   . Hyperlipidemia   . Thyroid disease   . Arthritis   . Melanoma   . Mitral valve prolapse   . Anxiety   . Perforated  bowel   . Peritonitis   1. History of right ocular melanoma, status post enucleation and brachytherapy (I do not have the actual records from the treatments, which were performed in [place], I believe in 2005; 2. History of papillary carcinoma of the thyroid, resected at [facility] approximately 3 years ago and followed by radiation to that area (most likely I-131 but again, I do not have those records).  Multiple other medical problems include hypertension, history of left body stroke with mild residuals, history of superficial thrombophlebitis, history of mitral valve prolapse, history of hyperlipidemia, history of GERD, history of ventral hernia repair x 2, history of hysterectomy with bilateral salpingo-oophorectomy for fibroids at age 45, history of fibrocystic breast changes, history of reactive airway disease, history of removal of a left axillary mass November 09, 2006, which was benign, history of allergic rhinitis, history of depression, history of osteoporosis, history of remote deep vein thrombosis.  PAST SURGICAL HISTORY: Past Surgical History  Procedure Date  . Abdominal hysterectomy   . Rotator cuff repair 2004    RIGHT SHOULDER  . Hernia repair   . Leg surgery     VEIN & BLOOD CLOT  . Cardiovascular stress test 2006    NORMAL  . Transthoracic echocardiogram 2006    FAMILY HISTORY Family History  Problem Relation Age of Onset  . Stroke Mother   . Asthma Father   . Diabetes Father   . Cancer Father   .  Cancer Sister   . Diabetes Sister   . Cancer Brother   The patient's father had a history of prostate cancer and kidney cancer.  He died at age 34.  The patient's mother had a history of stroke and died at age 58.  The patient had a brother who committed suicide, a sister who had cervical cancer, but did fine after hysterectomy and a sister who had "a very unusual cancer" involving the jaw and neck, now recurrent.  She is treated, I believe, out of [place].     GYNECOLOGIC HISTORY: She is GX P2.  Did not take hormone replacement after her hysterectomy.    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 She is now retired, divorced, and lives by herself. Her son, [name], lives in [place] and works as a Animator.  Her son, [name], is a "tree man", again locally.  The patient has 3 grandchildren, one in [place] and two here in town. She attends [employer]     ADVANCED DIRECTIVES: in place  HEALTH MAINTENANCE: History  Substance Use Topics  . Smoking status: Never Smoker   . Smokeless tobacco: Never Used  . Alcohol Use: No     Colonoscopy:  PAP:  Bone density:  Lipid panel:  Allergies  Allergen Reactions  . Aldactone (Spironolactone)     Burning in stomach  . Hydrocodone   . Levofloxacin   . Sertraline Hcl   . Sudafed (Pseudoephedrine Hcl)     Current Outpatient Prescriptions  Medication Sig Dispense Refill  . acetaminophen (TYLENOL) 650 MG CR tablet Per bottle      . albuterol (VENTOLIN HFA) 108 (90 BASE) MCG/ACT inhaler Inhale 2 puffs into the lungs every 6 (six) hours as needed. For shortness of breath      . ALPRAZolam (XANAX) 0.5 MG tablet Take 0.5 mg by mouth daily as needed.      [name] amLODipine (NORVASC) 5 MG tablet Take 1 tablet (5 mg total) by mouth daily.  90 tablet  3  . aspirin 81 MG tablet Take 81 mg by mouth daily.        . calcium citrate-vitamin D (CITRACAL+D) 315-200 MG-UNIT per tablet Take 1 tablet by mouth 2 (two) times daily.       [name] desonide (DESOWEN) 0.05 % lotion Apply as directed      . famotidine (PEPCID) 20 MG tablet Take 1 tablet (20 mg total) by mouth at bedtime. One at bedtime  30 tablet  2  . fexofenadine (ALLEGRA) 180 MG tablet Take 90 mg by mouth daily.       . fluticasone (FLONASE) 50 MCG/ACT nasal spray Place 2 sprays into the nose daily as needed. For dry  nasal passages      . Fluticasone-Salmeterol (ADVAIR) 250-50 MCG/DOSE AEPB Inhale 1 puff into the lungs 2 (two) times daily. As needed      . hydrochlorothiazide (HYDRODIURIL) 25 MG tablet Take 25 mg by mouth. 1/2 daily      . levothyroxine (SYNTHROID, LEVOTHROID) 125 MCG tablet Take 125 mcg by mouth daily.        . meclizine (ANTIVERT) 25 MG tablet       . omeprazole (PRILOSEC) 20 MG capsule Take 40 mg by mouth daily. 30-60 min before first meal of the day      . Probiotic Product (PROBIOTIC PO) Take  1 capsule by mouth daily.       . simvastatin (ZOCOR) 20 MG tablet Take 1 tablet (20 mg total) by mouth every evening.  90 tablet  3  . telmisartan (MICARDIS) 40 MG tablet Take 1 tablet (40 mg total) by mouth daily.  90 tablet  4  . traMADol (ULTRAM) 50 MG tablet Take 50 mg by mouth every 6 (six) hours as needed. For pain        OBJECTIVE: Middle-aged white woman who appears slightly disheveled Filed Vitals:   03/07/12 1138  BP: 133/74  Pulse: 62  Temp: 97.9 F (36.6 C)     Body mass index is 26.13 kg/(m^2).    ECOG FS: 2  Sclerae unicteric Oropharynx clear; erythema left external ear canal secondary to "cleaning trauma" No peripheral adenopathy Lungs no rales or rhonchi Heart regular rate and rhythm Abd benign MSK no focal spinal tenderness, no peripheral edema Neuro: nonfocal Breasts: deferred  LAB RESULTS: Results for ONALEE, STEINBACH (MRN [id number]) as of 03/07/2012 13:20  Ref. Range 12/03/2010 13:31 05/05/2011 09:09 05/05/2011 09:09 11/11/2011 10:28 02/29/2012 09:52  IgG (Immunoglobin G), Serum Latest Range: [id number] mg/dL 4098 1191 4782 9562 1308    Lab Results  Component Value Date   WBC 5.6 02/29/2012   NEUTROABS 3.0 02/29/2012   HGB 13.0 02/29/2012   HCT 38.0 02/29/2012   MCV 86.7 02/29/2012   PLT 257 02/29/2012      Chemistry      Component Value Date/Time   NA 138 02/29/2012 0952   K 4.1 02/29/2012 0952   CL 104 02/29/2012 0952   CO2 27 02/29/2012 0952   BUN 11 02/29/2012 0952     CREATININE 0.63 02/29/2012 0952      Component Value Date/Time   CALCIUM 9.6 02/29/2012 0952   ALKPHOS 44 02/29/2012 0952   AST 16 02/29/2012 0952   ALT 13 02/29/2012 0952   BILITOT 1.0 02/29/2012 0952       No results found for this basename: LABCA2    No components found with this basename: [id number]    No results found for this basename: INR:1;PROTIME:1 in the last 168 hours  Urinalysis    Component Value Date/Time   COLORURINE YELLOW 07/09/2011 1429   APPEARANCEUR CLEAR 07/09/2011 1429   LABSPEC 1.015 02/12/2012 1919   PHURINE 8.0 02/12/2012 1919   GLUCOSEU NEGATIVE 02/12/2012 1919   HGBUR NEGATIVE 02/12/2012 1919   BILIRUBINUR NEGATIVE 02/12/2012 1919   KETONESUR 15* 02/12/2012 1919   PROTEINUR NEGATIVE 02/12/2012 1919   UROBILINOGEN 0.2 02/12/2012 1919   NITRITE NEGATIVE 02/12/2012 1919   LEUKOCYTESUR NEGATIVE 02/12/2012 1919    STUDIES: Mammography 12/20/2011 was unremarkable  ASSESSMENT: 71 year old [place] woman with a history of:  (1) Ocular melanoma status post right eye enucleation and brachytherapy (I do not have the details) with no evidence of recurrence;  (2) History of papillary thyroid cancer, status post partial thyroidectomy and radioiodine (?) treatments at [facility], again with no evidence of recurrence;   (3) Monoclonal gammopathy of uncertain significance, IgG lambda with a negative bone survey    (4) melanoma removed from the 4 head in 1974, left arm 2005, right chest and shoulder 2006, left mid back to 2010; followed by Lu Duffel tafeen  (5) Moh's surgery to the nose x2, August of 2011  PLAN: Averleigh seems a bit more scattered of today than I remember her, and while her complaints of dizziness are entirely consistent with either mild vertigo or sinus problems, given her  past medical history I think a brain MRI would be appropriate. She does not want to obtain this test because she tells me she is scheduled for review of her ocular melanoma in August and she will  need to have a brain MRI before that visit. We left it that she is going to call him and see she can either move that appointment up, we'll get their clearance for using this MRI as part of her followup.  As far as her M-GUS is concerned there has been no significant change. The slight increase in her total IgG is likely due to a recent viral infection. We're going to recheck labs in October, and then of labs and visit in April of 2014. She knows to call for any problems that may develop before that visit.   [name] C    03/07/2012

## 2012-03-08 DIAGNOSIS — H9210 Otorrhea, unspecified ear: Secondary | ICD-10-CM | POA: Diagnosis not present

## 2012-03-08 DIAGNOSIS — H6120 Impacted cerumen, unspecified ear: Secondary | ICD-10-CM | POA: Diagnosis not present

## 2012-03-10 DIAGNOSIS — F411 Generalized anxiety disorder: Secondary | ICD-10-CM | POA: Diagnosis not present

## 2012-03-13 DIAGNOSIS — Z85828 Personal history of other malignant neoplasm of skin: Secondary | ICD-10-CM | POA: Diagnosis not present

## 2012-03-13 DIAGNOSIS — L723 Sebaceous cyst: Secondary | ICD-10-CM | POA: Diagnosis not present

## 2012-03-13 DIAGNOSIS — Z85820 Personal history of malignant melanoma of skin: Secondary | ICD-10-CM | POA: Diagnosis not present

## 2012-03-15 ENCOUNTER — Encounter: Payer: Self-pay | Admitting: *Deleted

## 2012-03-15 ENCOUNTER — Encounter: Payer: Medicare Other | Attending: Family Medicine | Admitting: *Deleted

## 2012-03-15 VITALS — Ht 61.0 in | Wt 136.0 lb

## 2012-03-15 DIAGNOSIS — E7800 Pure hypercholesterolemia, unspecified: Secondary | ICD-10-CM

## 2012-03-15 DIAGNOSIS — E785 Hyperlipidemia, unspecified: Secondary | ICD-10-CM | POA: Diagnosis not present

## 2012-03-15 DIAGNOSIS — Z713 Dietary counseling and surveillance: Secondary | ICD-10-CM | POA: Insufficient documentation

## 2012-03-15 DIAGNOSIS — I119 Hypertensive heart disease without heart failure: Secondary | ICD-10-CM | POA: Insufficient documentation

## 2012-03-15 DIAGNOSIS — I10 Essential (primary) hypertension: Secondary | ICD-10-CM

## 2012-03-15 NOTE — Progress Notes (Signed)
  Medical Nutrition Therapy:  Appt start time: 1630   End time: 1715  Primary concerns today:  [name] returns today for f/u with continued concern over her diet. Still needs food combinations low in sodium and CHO. States her kidneys are "bleeding", but labs WNL. Has no restrictions on protein, etc., but states she needs to eat foods that will protect her kidneys that are easy to make d/t tiring easily. Discussed using crock pot and storing in portions. Will do some research on appropriate foods/menu and follow up with pt via email.   MEDICATIONS: No changes reported  DIETARY INTAKE Previous 24-hr recall: B: (7:30-8:00 AM): oatmeal (mix of minute and the whole grain), cooks enough to last for for 4 days.  Uses 3/4-1 cup each morning with a little fat free milk, and 1/2-3/4 tsp of raw sugar or honey.  And 4oz of 50/50 OJ to take her morning pills.  Drinks water and sometimes 3/4 to 1/2 cup of decaf coffee and uses 1 tsp of the dairy creamer. OR may have an egg white omlett, and 1/3 of a slice of 2% cheese. Uses 1 tsp. black raspberry jelly (low carb) on the ommlett.  Eats No bread, bread contains too much sodium.   Snk (mid AM): rare unless Activia yogurt OR small tangerine OR 1/2 banana.  L (1:00 PM): 3-4 crackers (low sodium) with PB or maybe a yogurt and 1/2 banana or strawberries.  Snk (mid  PM): occ. Corn chips with no sodium with a helping of lite cream cheese or a homemade salsa (onion, green and red pepper). D (4:40-6:30 PM): Pork chop, salsa over this,  Fresh Broccoli steamed with whipped butter.  Snk (8:00 PM): 1/2 cup of Special K with Berries and milk (fat free) OR apple sauce or the Activia yogurt.  Beverages: green tea, water,decaf tea, decaf coffee,skim milk.  Recent physical activity:  House work, yard work.  No regular schedule of exercise.  Estimated energy needs:  Ht: 61 in  WT: 136.0 lb (63 Kg)  BMI: 26.1 kg/m2 1400 calories 155-160 g carbohydrates 100-105 g protein 37-39 g  fat  Progress Towards Goal(s):  In progress.   Nutritional Diagnosis:  NB-1.1 Food and nutrition-related knowledge deficit As related to sodium and carbohydrate content of various foods.  As evidenced by multiple questions regarding food sources and their sodium conten, seeking confirmation regarding current food intake..    Intervention: Nutrition Education.  Handouts given during visit include:  Choose-a-Meal booklet (by [facility])  Monitoring/Evaluation:  Dietary intake, exercise, and body weight TBD.

## 2012-03-16 ENCOUNTER — Encounter: Payer: Self-pay | Admitting: *Deleted

## 2012-03-17 ENCOUNTER — Encounter: Payer: Self-pay | Admitting: *Deleted

## 2012-03-17 ENCOUNTER — Telehealth: Payer: Self-pay | Admitting: Cardiology

## 2012-03-17 NOTE — Telephone Encounter (Signed)
Feels weak and tired by about 11:30 am.  Advised to contact PCP Dr Corliss Blacker, verbalized understanding

## 2012-03-17 NOTE — Telephone Encounter (Signed)
PT REQUESTING CALL RE BEING LOW ON CALCIUM, AND BEING "WEAK" IN HER HEART

## 2012-03-21 DIAGNOSIS — C73 Malignant neoplasm of thyroid gland: Secondary | ICD-10-CM | POA: Diagnosis not present

## 2012-03-21 DIAGNOSIS — E890 Postprocedural hypothyroidism: Secondary | ICD-10-CM | POA: Diagnosis not present

## 2012-03-27 DIAGNOSIS — M25579 Pain in unspecified ankle and joints of unspecified foot: Secondary | ICD-10-CM | POA: Diagnosis not present

## 2012-03-28 ENCOUNTER — Ambulatory Visit: Payer: Medicare Other | Admitting: Oncology

## 2012-03-28 ENCOUNTER — Other Ambulatory Visit: Payer: Self-pay | Admitting: *Deleted

## 2012-03-28 ENCOUNTER — Telehealth: Payer: Self-pay | Admitting: Cardiology

## 2012-03-28 NOTE — Telephone Encounter (Signed)
[name] calls very upset today. According to pt she received a $45.00 no show bill from Dr. Kristen Loader (office [phone]). This occurred on 09/21/11 when " I was very sick with the flu". She was in Dr. Yevonne Pax office that afternoon for an appt regarding her flu symptoms. She would like a letter written to Dr. Alwyn Ren office stating her situation at the time She cannot afford the $45.00 no show fee.  "I thought I cancelled the appt with Dr. Jennelle Human but I was just so sick that day I don't know for sure" She would like a call back when this is sent to his office.  I faxed a copy of her office note from 09/21/11 with Dr. Patty Sermons and attached a note in the memo.  I am not sure if this will be received well. They may need a formal fax letter sent. I will forward this to Julio Alm RN

## 2012-03-28 NOTE — Telephone Encounter (Signed)
Please return call to patient at hm# regarding medical care.

## 2012-03-29 ENCOUNTER — Other Ambulatory Visit: Payer: Self-pay | Admitting: *Deleted

## 2012-03-29 ENCOUNTER — Telehealth: Payer: Self-pay | Admitting: *Deleted

## 2012-03-29 NOTE — Telephone Encounter (Signed)
noted 

## 2012-03-30 ENCOUNTER — Telehealth: Payer: Self-pay | Admitting: *Deleted

## 2012-03-30 NOTE — Telephone Encounter (Signed)
This RN returned call to [name] per call to triage regarding need to schedule scans ASAP.  Note [name] has scan request from [place] MD's for restaging studies done on a yearly basis locally ordered thru Dr Darnelle Catalan.  Note [name] brought copy of request last week which were given to MD now misplaced.  Due to ongoing dizziness occuring [name] will obtain restaging studies now ( vs August ) with additional request for MRI of brain per Dr Darnelle Catalan. [name] states MD at [facility] per her call today " suggested I obtain an U/S of my carotid artery since I have had problems with it before"  [name] will come in am with request to have them entered in system and scheduled while she waits.  MD is aware of the above.

## 2012-03-30 NOTE — Telephone Encounter (Signed)
Patient called reporting she has had another spell.  Called [place] and was told to have Dr. Darnelle Catalan go ahead and schedule the MRI's and scans now.  Shw eill bring the orders in the morning.  Asked tat Dr. Darrall Dears nurse give her a return call at [phone].  Was trying riding mow her lawn, got dizzy and had another spell.

## 2012-03-31 ENCOUNTER — Ambulatory Visit: Payer: Medicare Other

## 2012-03-31 ENCOUNTER — Telehealth: Payer: Self-pay | Admitting: Oncology

## 2012-03-31 ENCOUNTER — Other Ambulatory Visit: Payer: Self-pay | Admitting: *Deleted

## 2012-03-31 ENCOUNTER — Ambulatory Visit (HOSPITAL_BASED_OUTPATIENT_CLINIC_OR_DEPARTMENT_OTHER): Payer: Medicare Other | Admitting: Lab

## 2012-03-31 ENCOUNTER — Other Ambulatory Visit: Payer: Self-pay | Admitting: Oncology

## 2012-03-31 DIAGNOSIS — C439 Malignant melanoma of skin, unspecified: Secondary | ICD-10-CM | POA: Diagnosis not present

## 2012-03-31 DIAGNOSIS — C787 Secondary malignant neoplasm of liver and intrahepatic bile duct: Secondary | ICD-10-CM

## 2012-03-31 DIAGNOSIS — C73 Malignant neoplasm of thyroid gland: Secondary | ICD-10-CM

## 2012-03-31 DIAGNOSIS — C6990 Malignant neoplasm of unspecified site of unspecified eye: Secondary | ICD-10-CM

## 2012-03-31 DIAGNOSIS — I639 Cerebral infarction, unspecified: Secondary | ICD-10-CM

## 2012-03-31 LAB — CBC WITH DIFFERENTIAL/PLATELET
Basophils Absolute: 0 10*3/uL (ref 0.0–0.1)
EOS%: 3 % (ref 0.0–7.0)
Eosinophils Absolute: 0.2 10*3/uL (ref 0.0–0.5)
LYMPH%: 39.4 % (ref 14.0–49.7)
MCH: 29.9 pg (ref 25.1–34.0)
MCV: 87.5 fL (ref 79.5–101.0)
MONO%: 8.3 % (ref 0.0–14.0)
NEUT%: 48.7 % (ref 38.4–76.8)
WBC: 5.8 10*3/uL (ref 3.9–10.3)

## 2012-03-31 LAB — COMPREHENSIVE METABOLIC PANEL
Albumin: 4.5 g/dL (ref 3.5–5.2)
Alkaline Phosphatase: 42 U/L (ref 39–117)
BUN: 13 mg/dL (ref 6–23)
Creatinine, Ser: 0.66 mg/dL (ref 0.50–1.10)
Glucose, Bld: 83 mg/dL (ref 70–99)
Potassium: 3.8 mEq/L (ref 3.5–5.3)
Total Bilirubin: 0.9 mg/dL (ref 0.3–1.2)

## 2012-03-31 NOTE — Telephone Encounter (Signed)
Pt came into the office with some paperwork from [place] to schedule some scan appts. Their were no orders in the system pertaining to the scans. S/w toni from rad to set up the appts which was not the coorect thing to do. S/w jan from dr magrinat's office regarding this patient's complaints and issues. Finally s/w dr Darnelle Catalan made him a copy of the scan orders from [facility] and he stated that he would enter them in the computer. Pt was scheduled fro the scans and left the office with the appts in hand. Will clal the pt to make her aware of the contrast she will need to drink prior to the ct scan appts.

## 2012-04-04 DIAGNOSIS — Z9189 Other specified personal risk factors, not elsewhere classified: Secondary | ICD-10-CM | POA: Diagnosis not present

## 2012-04-04 DIAGNOSIS — Z1212 Encounter for screening for malignant neoplasm of rectum: Secondary | ICD-10-CM | POA: Diagnosis not present

## 2012-04-04 DIAGNOSIS — Z124 Encounter for screening for malignant neoplasm of cervix: Secondary | ICD-10-CM | POA: Diagnosis not present

## 2012-04-10 ENCOUNTER — Other Ambulatory Visit (HOSPITAL_COMMUNITY): Payer: Medicare Other

## 2012-04-10 ENCOUNTER — Ambulatory Visit (HOSPITAL_COMMUNITY): Admission: RE | Admit: 2012-04-10 | Payer: Medicare Other | Source: Ambulatory Visit

## 2012-04-10 ENCOUNTER — Other Ambulatory Visit: Payer: Self-pay | Admitting: Oncology

## 2012-04-10 ENCOUNTER — Ambulatory Visit (HOSPITAL_COMMUNITY)
Admission: RE | Admit: 2012-04-10 | Discharge: 2012-04-10 | Disposition: A | Discharge status: Home / Self Care | Payer: Medicare Other | Source: Ambulatory Visit | Attending: Oncology | Admitting: Oncology

## 2012-04-10 DIAGNOSIS — C787 Secondary malignant neoplasm of liver and intrahepatic bile duct: Secondary | ICD-10-CM | POA: Diagnosis not present

## 2012-04-10 DIAGNOSIS — I639 Cerebral infarction, unspecified: Secondary | ICD-10-CM

## 2012-04-10 DIAGNOSIS — C6910 Malignant neoplasm of unspecified cornea: Secondary | ICD-10-CM | POA: Diagnosis not present

## 2012-04-10 DIAGNOSIS — C6990 Malignant neoplasm of unspecified site of unspecified eye: Secondary | ICD-10-CM

## 2012-04-10 DIAGNOSIS — I6789 Other cerebrovascular disease: Secondary | ICD-10-CM | POA: Diagnosis not present

## 2012-04-10 DIAGNOSIS — Z85820 Personal history of malignant melanoma of skin: Secondary | ICD-10-CM | POA: Diagnosis not present

## 2012-04-10 DIAGNOSIS — C439 Malignant melanoma of skin, unspecified: Secondary | ICD-10-CM

## 2012-04-10 DIAGNOSIS — C801 Malignant (primary) neoplasm, unspecified: Secondary | ICD-10-CM | POA: Diagnosis not present

## 2012-04-10 MED ORDER — GADOBENATE DIMEGLUMINE 529 MG/ML IV SOLN
12.0000 mL | Freq: Once | INTRAVENOUS | Status: AC | PRN
  Administered 2012-04-10: 12 mL via INTRAVENOUS

## 2012-04-12 ENCOUNTER — Other Ambulatory Visit: Payer: Self-pay | Admitting: *Deleted

## 2012-04-12 ENCOUNTER — Other Ambulatory Visit: Payer: Self-pay | Admitting: Oncology

## 2012-04-12 ENCOUNTER — Ambulatory Visit (HOSPITAL_COMMUNITY): Payer: Medicare Other

## 2012-04-12 ENCOUNTER — Ambulatory Visit (HOSPITAL_COMMUNITY)
Admission: RE | Admit: 2012-04-12 | Discharge: 2012-04-12 | Disposition: A | Discharge status: Home / Self Care | Payer: Medicare Other | Source: Ambulatory Visit | Attending: Oncology | Admitting: Oncology

## 2012-04-12 ENCOUNTER — Other Ambulatory Visit (HOSPITAL_COMMUNITY): Payer: Medicare Other

## 2012-04-12 DIAGNOSIS — C787 Secondary malignant neoplasm of liver and intrahepatic bile duct: Secondary | ICD-10-CM

## 2012-04-12 DIAGNOSIS — C439 Malignant melanoma of skin, unspecified: Secondary | ICD-10-CM

## 2012-04-12 DIAGNOSIS — C539 Malignant neoplasm of cervix uteri, unspecified: Secondary | ICD-10-CM | POA: Insufficient documentation

## 2012-04-12 DIAGNOSIS — I639 Cerebral infarction, unspecified: Secondary | ICD-10-CM

## 2012-04-12 DIAGNOSIS — Z85850 Personal history of malignant neoplasm of thyroid: Secondary | ICD-10-CM

## 2012-04-12 DIAGNOSIS — D739 Disease of spleen, unspecified: Secondary | ICD-10-CM | POA: Insufficient documentation

## 2012-04-12 DIAGNOSIS — K7689 Other specified diseases of liver: Secondary | ICD-10-CM | POA: Diagnosis not present

## 2012-04-12 DIAGNOSIS — Z8673 Personal history of transient ischemic attack (TIA), and cerebral infarction without residual deficits: Secondary | ICD-10-CM | POA: Diagnosis not present

## 2012-04-12 DIAGNOSIS — R42 Dizziness and giddiness: Secondary | ICD-10-CM | POA: Insufficient documentation

## 2012-04-12 DIAGNOSIS — Z8544 Personal history of malignant neoplasm of other female genital organs: Secondary | ICD-10-CM | POA: Diagnosis not present

## 2012-04-12 MED ORDER — IOHEXOL 300 MG/ML  SOLN
100.0000 mL | Freq: Once | INTRAMUSCULAR | Status: AC | PRN
  Administered 2012-04-12: 100 mL via INTRAVENOUS

## 2012-04-13 ENCOUNTER — Ambulatory Visit (HOSPITAL_COMMUNITY)
Admission: RE | Admit: 2012-04-13 | Discharge: 2012-04-13 | Disposition: A | Discharge status: Home / Self Care | Payer: Medicare Other | Source: Ambulatory Visit | Attending: Oncology | Admitting: Oncology

## 2012-04-13 DIAGNOSIS — R42 Dizziness and giddiness: Secondary | ICD-10-CM

## 2012-04-13 DIAGNOSIS — I639 Cerebral infarction, unspecified: Secondary | ICD-10-CM

## 2012-04-13 NOTE — Progress Notes (Signed)
VASCULAR LAB PRELIMINARY  PRELIMINARY  PRELIMINARY  PRELIMINARY  Carotid duplex completed.    Preliminary report:  Bilateral:  No evidence of hemodynamically significant internal carotid artery stenosis.   Vertebral artery flow is antegrade.     Harlan Vinal, RVT 04/13/2012, 9:40 AM

## 2012-04-17 ENCOUNTER — Ambulatory Visit (HOSPITAL_COMMUNITY)
Admission: RE | Admit: 2012-04-17 | Discharge: 2012-04-17 | Disposition: A | Discharge status: Home / Self Care | Payer: Medicare Other | Source: Ambulatory Visit | Attending: Oncology | Admitting: Oncology

## 2012-04-17 ENCOUNTER — Encounter (HOSPITAL_COMMUNITY): Payer: Self-pay

## 2012-04-17 DIAGNOSIS — Z8544 Personal history of malignant neoplasm of other female genital organs: Secondary | ICD-10-CM | POA: Insufficient documentation

## 2012-04-17 DIAGNOSIS — R0602 Shortness of breath: Secondary | ICD-10-CM | POA: Insufficient documentation

## 2012-04-17 DIAGNOSIS — Z923 Personal history of irradiation: Secondary | ICD-10-CM | POA: Diagnosis not present

## 2012-04-17 DIAGNOSIS — K7689 Other specified diseases of liver: Secondary | ICD-10-CM | POA: Diagnosis not present

## 2012-04-17 DIAGNOSIS — Z85850 Personal history of malignant neoplasm of thyroid: Secondary | ICD-10-CM

## 2012-04-17 DIAGNOSIS — R05 Cough: Secondary | ICD-10-CM | POA: Insufficient documentation

## 2012-04-17 DIAGNOSIS — C439 Malignant melanoma of skin, unspecified: Secondary | ICD-10-CM

## 2012-04-17 DIAGNOSIS — Z85820 Personal history of malignant melanoma of skin: Secondary | ICD-10-CM | POA: Insufficient documentation

## 2012-04-17 DIAGNOSIS — N281 Cyst of kidney, acquired: Secondary | ICD-10-CM | POA: Diagnosis not present

## 2012-04-17 DIAGNOSIS — I639 Cerebral infarction, unspecified: Secondary | ICD-10-CM

## 2012-04-17 DIAGNOSIS — C787 Secondary malignant neoplasm of liver and intrahepatic bile duct: Secondary | ICD-10-CM | POA: Insufficient documentation

## 2012-04-17 DIAGNOSIS — R059 Cough, unspecified: Secondary | ICD-10-CM | POA: Diagnosis not present

## 2012-04-17 MED ORDER — IOHEXOL 300 MG/ML  SOLN
100.0000 mL | Freq: Once | INTRAMUSCULAR | Status: AC | PRN
  Administered 2012-04-17: 100 mL via INTRAVENOUS

## 2012-04-17 MED ORDER — GADOXETATE DISODIUM 0.25 MMOL/ML IV SOLN: 6.0000 mL | Freq: Once | INTRAVENOUS | Status: DC | PRN

## 2012-04-19 NOTE — Telephone Encounter (Signed)
No inquiry needed

## 2012-04-26 DIAGNOSIS — R319 Hematuria, unspecified: Secondary | ICD-10-CM | POA: Diagnosis not present

## 2012-04-26 DIAGNOSIS — I6789 Other cerebrovascular disease: Secondary | ICD-10-CM | POA: Diagnosis not present

## 2012-04-26 DIAGNOSIS — C73 Malignant neoplasm of thyroid gland: Secondary | ICD-10-CM | POA: Diagnosis not present

## 2012-04-26 DIAGNOSIS — I10 Essential (primary) hypertension: Secondary | ICD-10-CM | POA: Diagnosis not present

## 2012-05-01 ENCOUNTER — Telehealth: Payer: Self-pay | Admitting: *Deleted

## 2012-05-01 DIAGNOSIS — M7750 Other enthesopathy of unspecified foot: Secondary | ICD-10-CM | POA: Diagnosis not present

## 2012-05-01 NOTE — Telephone Encounter (Signed)
PT. WAS GIVEN LAB RESULTS BUT SHE WANTS THE RESULTS FAXED TO THE CANCER CENTER IN [place], [place]. A NOTE WITH CONTACT INFORMATION AND FAX NUMBER TO THE CANCER CENTER WAS GIVEN TO DR.MAGRINAT'S NURSE,VAL DODD,RN.

## 2012-05-03 ENCOUNTER — Other Ambulatory Visit: Payer: Self-pay | Admitting: Obstetrics and Gynecology

## 2012-05-03 DIAGNOSIS — R87615 Unsatisfactory cytologic smear of cervix: Secondary | ICD-10-CM | POA: Diagnosis not present

## 2012-05-17 DIAGNOSIS — M79609 Pain in unspecified limb: Secondary | ICD-10-CM | POA: Diagnosis not present

## 2012-05-22 DIAGNOSIS — D3120 Benign neoplasm of unspecified retina: Secondary | ICD-10-CM | POA: Diagnosis not present

## 2012-05-22 DIAGNOSIS — C73 Malignant neoplasm of thyroid gland: Secondary | ICD-10-CM | POA: Diagnosis not present

## 2012-05-22 DIAGNOSIS — H3581 Retinal edema: Secondary | ICD-10-CM | POA: Diagnosis not present

## 2012-05-22 DIAGNOSIS — H31009 Unspecified chorioretinal scars, unspecified eye: Secondary | ICD-10-CM | POA: Diagnosis not present

## 2012-05-22 DIAGNOSIS — C6990 Malignant neoplasm of unspecified site of unspecified eye: Secondary | ICD-10-CM | POA: Diagnosis not present

## 2012-05-26 ENCOUNTER — Other Ambulatory Visit: Payer: Self-pay | Admitting: Oncology

## 2012-05-30 ENCOUNTER — Other Ambulatory Visit: Payer: Medicare Other

## 2012-05-30 ENCOUNTER — Ambulatory Visit: Payer: Medicare Other | Admitting: Cardiology

## 2012-06-01 ENCOUNTER — Ambulatory Visit (INDEPENDENT_AMBULATORY_CARE_PROVIDER_SITE_OTHER): Payer: Medicare Other | Admitting: Adult Health

## 2012-06-01 ENCOUNTER — Encounter: Payer: Self-pay | Admitting: Adult Health

## 2012-06-01 VITALS — BP 104/72 | HR 84 | Temp 96.9°F | Ht 61.0 in | Wt 132.8 lb

## 2012-06-01 DIAGNOSIS — I120 Hypertensive chronic kidney disease with stage 5 chronic kidney disease or end stage renal disease: Secondary | ICD-10-CM | POA: Diagnosis not present

## 2012-06-01 DIAGNOSIS — J019 Acute sinusitis, unspecified: Secondary | ICD-10-CM | POA: Diagnosis not present

## 2012-06-01 MED ORDER — AZITHROMYCIN 250 MG PO TABS: ORAL_TABLET | ORAL | Status: AC

## 2012-06-01 NOTE — Patient Instructions (Addendum)
Zpack take as directed.  Mucinex DM Twice daily   Saline nasal rinses As needed   Nasonex 2 puffs Twice daily  Until sample is gone.  Please contact office for sooner follow up if symptoms do not improve or worsen or seek emergency care  follow up Dr. Craige Cotta  In 2 months

## 2012-06-01 NOTE — Progress Notes (Signed)
Subjective:    Patient ID: [name], female    DOB: 11/11/1940    MRN: [id number]  Brief patient profile:  82 yowf never smoker with ? H/o asthma/ allergies to dust mold relatively well controlled since 2010 on advair and cough meds as needed but no maintenance rx previous to 2010 then  persistent cough since Sep 21 2011 while on advair and referred by Dr Clarene [facility] 10/21/2011 for pulmonary evaluation.   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  Started advair >  no benefit. Sob mostly with coughing but some better p ventolin.  No lateralizing pleuritic cp, no ex cp. Prev cough w/u in 2002 in pulmonary clinic by Gonzalez/Young with dx of ? Asthma/ ? gerd with uacs. >>imp was uacs, rec d/c advari and rx  w/ cough suprression w/ delsym/tramadol , steroid taper, and PPI/Pepcid combo.    11/04/2011 NP Follow up   Last ov started on cough suppression regimen with GERD prevention. CXR with no acute process. Cough is better but not completely gone.  Stopped her cough meds and PPI 2 days ago.  She has a lot of stress as she has upcoming Nose surgery for melanoma.  No discolored mucus or fever.  No chest pain or edema.  >>cont on PPI /pepcid , cough suppression rx   2/7/2013NP  Follow up  Pt is here to have check up prior to leaving on vacation . She is still doing well with less cough but developed sore throat yesterday. Had switched from allegra to zyrtec however was unable to tolerate  Zyrtec . She is now back on allegra. No increased cough, wheezing or fever. rec Use Delsym 2 tsp Twice daily  Until cough is gone.  May use Tramadol 50mg  in addition to help with cough -take 1 every 4 hr As needed   May use Allegra daily for drainage.  Take  Prilosec daily before meal  Take  Pepcid 20mg  At bedtime     12/09/11 ov/Sood ? [facility] Peers rx Zithromax 250 mg>>2 pills on first day,  then one pill daily for 4 days Advair one puff twice per day until sample completed Ventolin two puffs as needed for cough, wheeze, or chest congestion  12/21/2011 f/u ov/Wert  Cc only some better >  failed to answer a single question asked in a straightforward manner, tending to go off on tangents or answer questions with ambiguous medical terms or diagnoses and seemed aggravated/perplexed/ bewildered  when asked the same question more than once for clarification. Apparently cough resolved to her satisfaction but now has sob and sorethroat back on advair which I rec stopping in January with ? Benefit (certainly no increase in asthma symptoms or need for saba >>REC: steroid taper, stop advair and Prilosec 40mg  and Pepcid   01/04/2012 Follow up and Med review  Returns for 2 week follow up . Today. We reviewed all her medications and organize them into a medication calendar with patient education. It does appear that she is taking her medications correctly. She says that her cough has decreased some however, has not totally resolved. Feels that tramadol does help. It makes her sleepy. She did not have flare of cough or wheezing. Since stopping Advair. She denies any hemoptysis, chest pain, orthopnea, PND, or leg swelling. She says that she is under normal cement stress, as she's had several  cancer diagnosis. Over the last few years. >med calendar   02/18/2012 Follow up  Returns for follow up . Cough has totally resolved.  She does have several other medical problems that she is dealing with -dizziness, nose cancer , skin trouble, and GI issues  Remains off Advair . Rare use of albuterol .  No hemotpysis , weight loss or chest pain .   06/01/2012 Acute OV  Presents for acute office visit.  Complains prod cough with white mucus, hoarseness, head congestion/runny nose, body aches/sweats, increased SOB, tightness in chest x 4 days.  No fever or chest pain . No edema Did fall recently on vacation at beach  with foot fx, in walking boot.  No hemoptysis.      ROS:  Constitutional:   No  weight loss, night sweats,  Fevers, chills,  +fatigue, or  lassitude.  HEENT:   No headaches,  Difficulty swallowing,  Tooth/dental problems, or  Sore throat,                No sneezing, itching, ear ache,  +nasal congestion, post nasal drip,   CV:  No chest pain,  Orthopnea, PND, swelling in lower extremities, anasarca, dizziness, palpitations, syncope.   GI  +heartburn, indigestion,  No abdominal pain, nausea, vomiting, diarrhea, change in bowel habits, loss of appetite, bloody stools.   Resp:    No coughing up of blood.  No change in color of mucus.  No wheezing.  No chest wall deformity  Skin: no rash or lesions.  GU: no dysuria, change in color of urine, no urgency or frequency.  No flank pain, no hematuria   MS:  No joint pain or swelling.  No decreased range of motion.  No back pain.  Psych:  No change in mood or affect. No depression or anxiety.  No memory loss.                    Objective:   Physical Exam  Hoarse amb wf nad  Wt  144 10/21/2011>144 11/04/2011 >144 11/11/2011 > 12/21/2011  143 >>142 01/04/2012 >>137 02/18/2012  >132 06/01/2012   HEENT: nl dentition, turbinates, and orophanx. Nl external ear canals without cough reflex   NECK :  without JVD/Nodes/TM/ nl carotid upstrokes bilaterally   LUNGS: no acc muscle use, clear to A and P bilaterally without cough on insp or exp maneuvers   CV:  RRR  no s3 or murmur or increase in P2, no edema   ABD:  soft and nontender with nl excursion in the supine position. No bruits or organomegaly, bowel sounds nl  MS:  warm without deformities, calf tenderness, cyanosis or clubbing  SKIN: warm and dry without lesions    NEURO:  alert, approp, no deficits        Assessment & Plan:

## 2012-06-01 NOTE — Assessment & Plan Note (Signed)
Zpack take as directed.  Mucinex DM Twice daily   Saline nasal rinses As needed   Nasonex 2 puffs Twice daily  Until sample is gone.  Please contact office for sooner follow up if symptoms do not improve or worsen or seek emergency care  follow up Dr. Sood  In 2 months  

## 2012-06-01 NOTE — Progress Notes (Signed)
Reviewed and agree with assessment/plan. 

## 2012-06-08 DIAGNOSIS — M79609 Pain in unspecified limb: Secondary | ICD-10-CM | POA: Diagnosis not present

## 2012-06-10 ENCOUNTER — Telehealth: Payer: Self-pay | Admitting: Internal Medicine

## 2012-06-10 MED ORDER — AMOXICILLIN-POT CLAVULANATE 875-125 MG PO TABS: 1.0000 | ORAL_TABLET | Freq: Two times a day (BID) | ORAL | Status: AC

## 2012-06-10 NOTE — Telephone Encounter (Signed)
Saw TP last week for sinusitis. Has not responded to Z-pak. Now has persistent purulent nasal discharge, drip with cough. Denies fever, headache. P- Augmentin 875mg , # 14 > [facility]

## 2012-06-14 DIAGNOSIS — M7750 Other enthesopathy of unspecified foot: Secondary | ICD-10-CM | POA: Diagnosis not present

## 2012-06-15 DIAGNOSIS — D472 Monoclonal gammopathy: Secondary | ICD-10-CM | POA: Diagnosis not present

## 2012-06-19 ENCOUNTER — Ambulatory Visit (INDEPENDENT_AMBULATORY_CARE_PROVIDER_SITE_OTHER): Payer: Medicare Other | Admitting: Pulmonary Disease

## 2012-06-19 ENCOUNTER — Encounter: Payer: Self-pay | Admitting: Pulmonary Disease

## 2012-06-19 VITALS — BP 118/72 | HR 73 | Temp 98.7°F | Ht 61.0 in | Wt 135.6 lb

## 2012-06-19 DIAGNOSIS — J019 Acute sinusitis, unspecified: Secondary | ICD-10-CM | POA: Diagnosis not present

## 2012-06-19 NOTE — Assessment & Plan Note (Addendum)
The patient's history is most consistent with a persistent acute sinusitis.  Unfortunately, she did not take her antibiotics as prescribed, and therefore has received subtherapeutic treatment.  She is very hesitant to take Augmentin twice a day, and therefore I will change her to [facility] for a seven-day course.  I have also encouraged her to work on nasal hygiene with Lloyd Huger med rinses.  If she continues to have symptoms despite the above, she may require a CT of her sinuses for further evaluation.

## 2012-06-19 NOTE — Progress Notes (Signed)
  Subjective:    Patient ID: [name], female    DOB: 10-05-1940, 71 y.o.   MRN: [id number]  HPI Patient comes in today for an acute sick visit.  She has a questionable history of asthma, and was seen by our our nurse practitioner the end of August for what sounds like acute sinusitis.  She was treated with a Z-Pak, however had no improvement, and therefore Augmentin was called in for further treatment.  Unfortunately, she was scared to take this because of her history of kidney dysfunction, although she has a normal BUN and creatinine by recent testing.  She comes in today with worsening sinus pressure, ear discomfort, as well as significant postnasal drip.  She has only been taking the Augmentin once a day, and feels that it may slowly be helping her.  She denies any fever, chills, or sweats.   Review of Systems  Constitutional: Negative for fever and unexpected weight change.  HENT: Positive for congestion, rhinorrhea and sinus pressure. Negative for ear pain, nosebleeds, sore throat, sneezing, trouble swallowing, dental problem and postnasal drip.   Eyes: Negative for redness and itching.  Respiratory: Positive for choking, shortness of breath and wheezing. Negative for cough and chest tightness.   Cardiovascular: Negative for palpitations and leg swelling.  Gastrointestinal: Negative for nausea and vomiting.  Genitourinary: Negative for dysuria.  Musculoskeletal: Negative for joint swelling.  Skin: Negative for rash.  Neurological: Negative for headaches.  Hematological: Does not bruise/bleed easily.  Psychiatric/Behavioral: Positive for dysphoric mood. The patient is not nervous/anxious.        Objective:   Physical Exam Well-developed female in no acute distress Nose with erythematous and swollen mucosa, but no purulence Oropharynx clear Chest totally clear to auscultation, no wheezes Cardiac exam with regular rate and rhythm Lower extremities with no significant edema, no  cyanosis Alert and oriented, moves all 4 extremities.       Assessment & Plan:

## 2012-06-19 NOTE — Patient Instructions (Addendum)
omnicef 300mg  one in am and pm for 7 days.   Use neilmed sinus rinses am and pm to clean out sinuses for one week. Can use mucinex 1200mg  one in am and pm everyday for one week. Please let us know if not getting better.

## 2012-06-20 ENCOUNTER — Telehealth: Payer: Self-pay | Admitting: Pulmonary Disease

## 2012-06-20 DIAGNOSIS — M7750 Other enthesopathy of unspecified foot: Secondary | ICD-10-CM | POA: Diagnosis not present

## 2012-06-20 MED ORDER — CEFDINIR 300 MG PO CAPS: 300.0000 mg | ORAL_CAPSULE | Freq: Two times a day (BID) | ORAL | Status: DC

## 2012-06-20 NOTE — Telephone Encounter (Signed)
I spoke with pt and she was upset her medication did not get called in. I have sent omnicef to the pharmacy. Nothing further was needed

## 2012-06-23 DIAGNOSIS — M7750 Other enthesopathy of unspecified foot: Secondary | ICD-10-CM | POA: Diagnosis not present

## 2012-06-28 DIAGNOSIS — M7750 Other enthesopathy of unspecified foot: Secondary | ICD-10-CM | POA: Diagnosis not present

## 2012-06-29 ENCOUNTER — Telehealth: Payer: Self-pay | Admitting: Pulmonary Disease

## 2012-06-29 MED ORDER — CEFDINIR 300 MG PO CAPS: 300.0000 mg | ORAL_CAPSULE | Freq: Two times a day (BID) | ORAL | Status: DC

## 2012-06-29 NOTE — Telephone Encounter (Signed)
Will re route msg to CDY (doc of the day) since [facility] out today CDY, please read below msg and advise recs thanks!

## 2012-06-29 NOTE — Telephone Encounter (Signed)
Spoke with pt and notified of recs per CDY Rx was sent to pharm  Pt states nothing further needed

## 2012-06-29 NOTE — Telephone Encounter (Signed)
Per CY-okay to give Omnicef 300 mg #14 take 1 po bid x 7 days no refills.

## 2012-06-29 NOTE — Telephone Encounter (Signed)
Spoke with pt. She was last seen by [facility] on 06/19/12. She states that she is doing better since she finished omnicef, but still has some cough with "chunky" clear to white sputum and PND. She states sometimes her "head buzzes". She feels needs a few more days of abx to "get over the hump".  She states that she is still using sinus rinses at least once daily and taking the mucinex as directed. Please advise thanks!! Allergies  Allergen Reactions  . Aldactone (Spironolactone)     Burning in stomach  . Hydrocodone   . Levofloxacin   . Sertraline Hcl   . Sudafed (Pseudoephedrine Hcl)   . Ultram (Tramadol)     Headaches

## 2012-06-30 DIAGNOSIS — M7750 Other enthesopathy of unspecified foot: Secondary | ICD-10-CM | POA: Diagnosis not present

## 2012-07-03 DIAGNOSIS — M7750 Other enthesopathy of unspecified foot: Secondary | ICD-10-CM | POA: Diagnosis not present

## 2012-07-05 DIAGNOSIS — M79609 Pain in unspecified limb: Secondary | ICD-10-CM | POA: Diagnosis not present

## 2012-07-06 ENCOUNTER — Other Ambulatory Visit (INDEPENDENT_AMBULATORY_CARE_PROVIDER_SITE_OTHER): Payer: Medicare Other

## 2012-07-06 ENCOUNTER — Ambulatory Visit (INDEPENDENT_AMBULATORY_CARE_PROVIDER_SITE_OTHER): Payer: Medicare Other | Admitting: Cardiology

## 2012-07-06 ENCOUNTER — Encounter: Payer: Self-pay | Admitting: Cardiology

## 2012-07-06 VITALS — BP 124/79 | HR 72 | Ht 61.0 in | Wt 132.8 lb

## 2012-07-06 DIAGNOSIS — D472 Monoclonal gammopathy: Secondary | ICD-10-CM

## 2012-07-06 DIAGNOSIS — E7800 Pure hypercholesterolemia, unspecified: Secondary | ICD-10-CM | POA: Diagnosis not present

## 2012-07-06 DIAGNOSIS — R0789 Other chest pain: Secondary | ICD-10-CM | POA: Diagnosis not present

## 2012-07-06 DIAGNOSIS — I119 Hypertensive heart disease without heart failure: Secondary | ICD-10-CM

## 2012-07-06 DIAGNOSIS — C439 Malignant melanoma of skin, unspecified: Secondary | ICD-10-CM

## 2012-07-06 LAB — CBC WITH DIFFERENTIAL/PLATELET
Basophils Absolute: 0 10*3/uL (ref 0.0–0.1)
HCT: 38.9 % (ref 36.0–46.0)
Lymphs Abs: 2 10*3/uL (ref 0.7–4.0)
MCV: 88 fl (ref 78.0–100.0)
Monocytes Absolute: 0.4 10*3/uL (ref 0.1–1.0)
Monocytes Relative: 8.1 % (ref 3.0–12.0)
Platelets: 224 10*3/uL (ref 150.0–400.0)
RDW: 12.8 % (ref 11.5–14.6)

## 2012-07-06 LAB — BASIC METABOLIC PANEL
Calcium: 9.5 mg/dL (ref 8.4–10.5)
GFR: 87.15 mL/min (ref >60.00)
Sodium: 139 mEq/L (ref 135–145)

## 2012-07-06 LAB — HEPATIC FUNCTION PANEL
ALT: 17 U/L (ref 0–35)
AST: 22 U/L (ref 0–37)
Albumin: 4.1 g/dL (ref 3.5–5.2)

## 2012-07-06 LAB — LIPID PANEL
Cholesterol: 173 mg/dL (ref 0–200)
HDL: 55.9 mg/dL (ref >39.00)
Triglycerides: 92 mg/dL (ref 0.0–149.0)

## 2012-07-06 NOTE — Assessment & Plan Note (Signed)
She is requesting to see a specialist regarding her monoclonal gammopathy of unknown significance.  I advised her to discuss this request with her oncologist Dr. Darnelle Catalan.

## 2012-07-06 NOTE — Progress Notes (Signed)
[name] Date of Birth:  08/21/1941 [facility] [address] [place], [place]  [address] [phone]         Fax   [phone]  History of Present Illness: This pleasant 71 year old woman is seen  for followup office visit.   She has a past history of essential hypertension. She also has a past history of hypercholesterolemia and a past history of melanoma as well as thyroid cancer. She has had previous atypical chest pain. She had a normal nuclear stress test in 2006.  Since last visit she has had several new problems.  In July she broke her left foot and has been undergoing care from Dr. Toni Arthurs. She also has had problems with acute sinusitis for the past 5 weeks.  She is just finishing a course of Omnicef.  She also has a blood protein dyscrasia diagnosed as MGUS.   Current Outpatient Prescriptions  Medication Sig Dispense Refill  . acetaminophen (TYLENOL) 650 MG CR tablet Per bottle      . albuterol (VENTOLIN HFA) 108 (90 BASE) MCG/ACT inhaler Inhale 2 puffs into the lungs every 6 (six) hours as needed. For shortness of breath      . amLODipine (NORVASC) 5 MG tablet Take 1 tablet (5 mg total) by mouth daily.  90 tablet  3  . aspirin 81 MG tablet Take 81 mg by mouth daily.        . calcium citrate-vitamin D (CITRACAL+D) 315-200 MG-UNIT per tablet Take 1 tablet by mouth 2 (two) times daily.       . cefdinir (OMNICEF) 300 MG capsule Take 1 capsule (300 mg total) by mouth 2 (two) times daily.  14 capsule  0  . clonazePAM (KLONOPIN) 0.5 MG tablet Take 0.5 mg by mouth at bedtime as needed.       . fexofenadine (ALLEGRA) 180 MG tablet Take 90 mg by mouth daily.       . fluticasone (FLONASE) 50 MCG/ACT nasal spray Place 2 sprays into the nose daily as needed. For dry nasal passages      . Fluticasone-Salmeterol (ADVAIR) 250-50 MCG/DOSE AEPB Inhale 1 puff into the lungs 2 (two) times daily. As needed      . hydrochlorothiazide (HYDRODIURIL) 25 MG tablet Take 25  mg by mouth. 1/2 daily      . levothyroxine (SYNTHROID, LEVOTHROID) 125 MCG tablet Take 125 mcg by mouth daily.        . Probiotic Product (PROBIOTIC PO) Take 1 capsule by mouth daily.       . simvastatin (ZOCOR) 20 MG tablet Take 1 tablet (20 mg total) by mouth every evening.  90 tablet  3  . telmisartan (MICARDIS) 40 MG tablet Take 1 tablet (40 mg total) by mouth daily.  90 tablet  4    Allergies  Allergen Reactions  . Aldactone (Spironolactone)     Burning in stomach  . Hydrocodone   . Levofloxacin   . Quinolones   . Sertraline Hcl   . Sudafed (Pseudoephedrine Hcl)   . Ultram (Tramadol)     Headaches     Patient Active Problem List  Diagnosis  . Benign hypertensive heart disease without heart failure  . Hypercholesterolemia  . Hypothyroidism  . History of melanoma  . Hx of thyroid cancer  . Chest pain, atypical  . Dizziness  . Cough  . MGUS (monoclonal gammopathy of unknown significance)  . Hypokalemia  . Acute sinusitis    History  Smoking status  .  Never Smoker   Smokeless tobacco  . Never Used    History  Alcohol Use No    Family History  Problem Relation Age of Onset  . Stroke Mother   . Asthma Father   . Diabetes Father   . Cancer Father   . Cancer Sister   . Diabetes Sister   . Cancer Brother     Review of Systems: Constitutional: no fever chills diaphoresis or fatigue or change in weight.  Head and neck: no hearing loss, no epistaxis, no photophobia or visual disturbance. Respiratory: No cough, shortness of breath or wheezing. Cardiovascular: No chest pain peripheral edema, palpitations. Gastrointestinal: No abdominal distention, no abdominal pain, no change in bowel habits hematochezia or melena. Genitourinary: No dysuria, no frequency, no urgency, no nocturia. Musculoskeletal:No arthralgias, no back pain, no gait disturbance or myalgias. Neurological: No dizziness, no headaches, no numbness, no seizures, no syncope, no weakness, no  tremors. Hematologic: No lymphadenopathy, no easy bruising. Psychiatric: No confusion, no hallucinations, no sleep disturbance.    Physical Exam: Filed Vitals:   07/06/12 0912  BP: 124/79  Pulse: 72   the general appearance reveals a well-developed well-nourished woman in no distress.The head and neck exam reveals pupils equal and reactive.  Extraocular movements are full.  There is no scleral icterus.  The mouth and pharynx are normal.  The neck is supple.  The carotids reveal no bruits.  The jugular venous pressure is normal.  The  thyroid is not enlarged.  There is no lymphadenopathy.  The chest is clear to percussion and auscultation.  There are no rales or rhonchi.  Expansion of the chest is symmetrical.  The precordium is quiet.  The first heart sound is normal.  The second heart sound is physiologically split.  There is no murmur gallop rub or click.  There is no abnormal lift or heave.  The abdomen is soft and nontender.  The bowel sounds are normal.  The liver and spleen are not enlarged.  There are no abdominal masses.  There are no abdominal bruits.  Extremities reveal good pedal pulses.  There is no phlebitis or edema.  There is no cyanosis or clubbing.  Strength is normal and symmetrical in all extremities.  There is no lateralizing weakness.  There are no sensory deficits.  The skin is warm and dry.  There is no rash.     Assessment / Plan: Continue same medication.  Recheck here in 4 months for followup office visit EKG and fasting lipid panel hepatic function panel and basal metabolic panel.

## 2012-07-06 NOTE — Assessment & Plan Note (Signed)
She has had some shortness of breath related to her acute sinusitis and chronic cough.  She has not been having any symptoms of congestive heart failure.

## 2012-07-06 NOTE — Assessment & Plan Note (Signed)
The patient has hypercholesterolemia and is on simvastatin 20 mg daily.  We are checking lab work today.  She is requesting a written copy of her lab work to keep her files and I told her we would be glad to do that.  She does not appear to be having any side effects from the simvastatin

## 2012-07-06 NOTE — Progress Notes (Signed)
Quick Note:  Please report to patient. The recent labs are stable. Continue same medication and careful diet. ______ 

## 2012-07-06 NOTE — Assessment & Plan Note (Signed)
The patient has not been experiencing any recent chest pain or angina pectoris.  She has not been aware of any palpitations or fluttering of her heart

## 2012-07-06 NOTE — Patient Instructions (Addendum)
Your physician recommends that you schedule a follow-up appointment in: 4 months with fasting labs,bmet,lipids,hepatic.   Continue same medications

## 2012-07-07 DIAGNOSIS — M79609 Pain in unspecified limb: Secondary | ICD-10-CM | POA: Diagnosis not present

## 2012-07-10 ENCOUNTER — Telehealth: Payer: Self-pay | Admitting: Cardiology

## 2012-07-10 NOTE — Telephone Encounter (Signed)
New message:  Pt would like for you to call her regarding her lab work that she had done.

## 2012-07-10 NOTE — Telephone Encounter (Signed)
Called and discussed labs, mailed patient a copy

## 2012-07-11 ENCOUNTER — Other Ambulatory Visit (HOSPITAL_BASED_OUTPATIENT_CLINIC_OR_DEPARTMENT_OTHER): Payer: Medicare Other | Admitting: Lab

## 2012-07-11 DIAGNOSIS — D472 Monoclonal gammopathy: Secondary | ICD-10-CM

## 2012-07-11 LAB — CBC WITH DIFFERENTIAL/PLATELET
Basophils Absolute: 0 10*3/uL (ref 0.0–0.1)
Eosinophils Absolute: 0.2 10*3/uL (ref 0.0–0.5)
HGB: 13 g/dL (ref 11.6–15.9)
MCV: 87 fL (ref 79.5–101.0)
MONO%: 9.6 % (ref 0.0–14.0)
NEUT#: 3.2 10*3/uL (ref 1.5–6.5)
Platelets: 267 10*3/uL (ref 145–400)
RDW: 12.7 % (ref 11.2–14.5)

## 2012-07-11 LAB — COMPREHENSIVE METABOLIC PANEL (CC13)
Albumin: 3.9 g/dL (ref 3.5–5.0)
Alkaline Phosphatase: 46 U/L (ref 40–150)
BUN: 14 mg/dL (ref 7.0–26.0)
Calcium: 9.9 mg/dL (ref 8.4–10.4)
Glucose: 88 mg/dl (ref 70–99)
Potassium: 4.2 mEq/L (ref 3.5–5.1)

## 2012-07-13 ENCOUNTER — Other Ambulatory Visit: Payer: Self-pay | Admitting: Oncology

## 2012-07-14 LAB — IGG, IGA, IGM: IgA: 17 mg/dL — ABNORMAL LOW (ref 69–380)

## 2012-07-14 LAB — KAPPA/LAMBDA LIGHT CHAINS: Kappa free light chain: 0.55 mg/dL (ref 0.33–1.94)

## 2012-07-17 ENCOUNTER — Telehealth: Payer: Self-pay | Admitting: Pulmonary Disease

## 2012-07-17 NOTE — Telephone Encounter (Signed)
She was previously followed by Dr. Sherene Sires.  I saw her once as an acute sick visit in March 2013.  She then requested to change providers from Dr. Sherene Sires to me.  She has not been back to see me since.  She has been seen by [facility], and most recently Dr. Shelle Iron.  It is okay for her to change providers to Dr. Shelle Iron.  Please confirm with Dr. Shelle Iron.

## 2012-07-17 NOTE — Telephone Encounter (Signed)
Dr. Shelle Iron please advise if you agree with switch. Carron Curie, CMA

## 2012-07-17 NOTE — Telephone Encounter (Signed)
ATC line rang several times without a response no vm wcb

## 2012-07-17 NOTE — Telephone Encounter (Signed)
Per TD and KC pt needs to stay with Dr. Craige Cotta. I spoke with the pt and advised that it is best if she stay with Dr. Craige Cotta since she has established care with him. The pt became very upset and states she never established with Dr. Craige Cotta, that she only saw him once and never followed up. She states she does not understand why she cannot change doctors. I again advised it was best for her care to stay with Dr. Craige Cotta since she has already established with him. Pt stated she would come in for OV with PW and decide from there what she wanted to do. Carron Curie, CMA

## 2012-07-17 NOTE — Telephone Encounter (Signed)
Pt returned.  [name] ° °

## 2012-07-17 NOTE — Telephone Encounter (Signed)
I spoke with the pt and she has 2 issues: 1) She is requesting to switch from Dr. Craige Cotta to Dr. Shelle Iron.  Please advise.   2) Also she states she is having increased chest discomfort, ear pain, increased SOB, and productive cough with clear phelgm. Pt is requesting an appt. Appt set for tomorrow at 12 noon with PW.   Dr. Craige Cotta please advise if ok to chang providers.Carron Curie, CMA

## 2012-07-17 NOTE — Telephone Encounter (Signed)
I would rather not.  

## 2012-07-18 ENCOUNTER — Ambulatory Visit (INDEPENDENT_AMBULATORY_CARE_PROVIDER_SITE_OTHER): Payer: Medicare Other | Admitting: Critical Care Medicine

## 2012-07-18 ENCOUNTER — Encounter: Payer: Self-pay | Admitting: Critical Care Medicine

## 2012-07-18 VITALS — BP 110/78 | HR 77 | Temp 97.8°F | Ht 61.0 in | Wt 134.4 lb

## 2012-07-18 DIAGNOSIS — Z23 Encounter for immunization: Secondary | ICD-10-CM | POA: Diagnosis not present

## 2012-07-18 DIAGNOSIS — R059 Cough, unspecified: Secondary | ICD-10-CM

## 2012-07-18 DIAGNOSIS — R05 Cough: Secondary | ICD-10-CM | POA: Diagnosis not present

## 2012-07-18 MED ORDER — MOMETASONE FUROATE 220 MCG/INH IN AEPB: 2.0000 | INHALATION_SPRAY | Freq: Every day | RESPIRATORY_TRACT | Status: DC

## 2012-07-18 MED ORDER — FLUTICASONE PROPIONATE 50 MCG/ACT NA SUSP: 2.0000 | Freq: Every day | NASAL | Status: DC

## 2012-07-18 NOTE — Patient Instructions (Addendum)
Stop Advair Start Asmanex--2 puffs daily Start Flonase--2 puffs each nostril daily Use albuterol as needed No further antibiotics See Dr. Delford Field again as needed in 2 months

## 2012-07-18 NOTE — Progress Notes (Signed)
Subjective:    Patient ID: [name], female    DOB: 1941-04-12, 71 y.o.   MRN: [id number]  HPI  Patient comes in today for an acute sick visit.  She has a questionable history of asthma, and was seen by our our nurse practitioner the end of August for what sounds like acute sinusitis.  She was treated with a Z-Pak, however had no improvement, and therefore Augmentin was called in for further treatment.  Unfortunately, she was scared to take this because of her history of kidney dysfunction, although she has a normal BUN and creatinine by recent testing.  She comes in today with worsening sinus pressure, ear discomfort, as well as significant postnasal drip.  She has only been taking the Augmentin once a day, and feels that it may slowly be helping her.  She denies any fever, chills, or sweats.  07/18/2012 Acute OV F/u sinusitis.  Saw KC 06/19/12 as above who Rx omniceph x 7days further. She has seen VS, MW, KC over the course of past several months. Also has been seen by the NP This did help and felt better.  Pt has hx of CA and foot fx.  Not able to rest to get well. Now MGUS issues and myeloma. Sees [facility].   Now some cough but not as much.  Pt uses cough drops and cough meds. Did have some dyspnea but is better now.   Now some pndrip but is better. Ears feel full. No real chest tightness.,  Cough History of cyclical cough which has been labeled as due to upper airway instability postnasal drip syndrome exacerbated by reflux disease. Note spirometry has been normal and remained so on this visit. The patient has been cycled on and off Advair with variable effect on the cough. The patient has been seen by multiple pulmonary providers in the pulmonary clinic. The patient wishes to reestablish in this clinic for a final opinion. By my review the patient's records and her symptom complex and examination lower airway inflammation needs to stay in the differential diagnosis. To this end I believe  and inhaled steroid would be appropriate for this patient's care however Advair is likely going to contribute to upper airway irritation. Furthermore chronic recurrent sinusitis has been an issue for this patient but this now appears to be resolved.  No chronic allergic rhinitis as an ongoing issue in this patient  Plan Stop Advair Start Asmanex--2 puffs daily Start Flonase--2 puffs each nostril daily Use albuterol as needed No further antibiotics See Dr. Delford Field again as needed in 2 months Note flu vaccine was administered at this visit   Updated Medication List Outpatient Encounter Prescriptions as of 07/18/2012  Medication Sig Dispense Refill  . acetaminophen (TYLENOL) 650 MG CR tablet Per bottle      . albuterol (VENTOLIN HFA) 108 (90 BASE) MCG/ACT inhaler Inhale 2 puffs into the lungs every 6 (six) hours as needed. For shortness of breath      . amLODipine (NORVASC) 5 MG tablet Take 1 tablet (5 mg total) by mouth daily.  90 tablet  3  . aspirin 81 MG tablet Take 81 mg by mouth daily.        . calcium citrate-vitamin D (CITRACAL+D) 315-200 MG-UNIT per tablet Take 1 tablet by mouth 2 (two) times daily.       . clonazePAM (KLONOPIN) 0.5 MG tablet Take 0.5 mg by mouth at bedtime as needed.       . fexofenadine (ALLEGRA) 180 MG tablet  Take 90 mg by mouth daily.       . hydrochlorothiazide (HYDRODIURIL) 25 MG tablet Take 25 mg by mouth. 1/2 daily      . levothyroxine (SYNTHROID, LEVOTHROID) 125 MCG tablet Take 125 mcg by mouth daily.        . simvastatin (ZOCOR) 20 MG tablet Take 1 tablet (20 mg total) by mouth every evening.  90 tablet  3  . telmisartan (MICARDIS) 40 MG tablet Take 1 tablet (40 mg total) by mouth daily.  90 tablet  4  . DISCONTD: Fluticasone-Salmeterol (ADVAIR) 250-50 MCG/DOSE AEPB Inhale 1 puff into the lungs 2 (two) times daily. As needed      . fluticasone (FLONASE) 50 MCG/ACT nasal spray Place 2 sprays into the nose daily as needed. For dry nasal passages      .  fluticasone (FLONASE) 50 MCG/ACT nasal spray Place 2 sprays into the nose daily.  16 g  3  . mometasone (ASMANEX 60 METERED DOSES) 220 MCG/INH inhaler Inhale 2 puffs into the lungs daily.  1 Inhaler  3  . Probiotic Product (PROBIOTIC PO) Take 1 capsule by mouth daily.       [name] DISCONTD: cefdinir (OMNICEF) 300 MG capsule Take 1 capsule (300 mg total) by mouth 2 (two) times daily.  14 capsule  0      Review of Systems  Constitutional: Negative for fever and unexpected weight change.  HENT: Positive for rhinorrhea. Negative for ear pain, nosebleeds, congestion, sore throat, sneezing, trouble swallowing, dental problem, postnasal drip and sinus pressure.   Eyes: Negative for redness and itching.  Respiratory: Negative for cough, choking, chest tightness, shortness of breath and wheezing.   Cardiovascular: Negative for palpitations and leg swelling.  Gastrointestinal: Negative for nausea and vomiting.  Genitourinary: Negative for dysuria.  Musculoskeletal: Negative for joint swelling.  Skin: Negative for rash.  Neurological: Negative for headaches.  Hematological: Does not bruise/bleed easily.  Psychiatric/Behavioral: Positive for dysphoric mood. The patient is not nervous/anxious.        Objective:   Physical Exam  Filed Vitals:   07/18/12 1157  BP: 110/78  Pulse: 77  Temp: 97.8 F (36.6 C)  TempSrc: Oral  Height: 5\' 1"  (1.549 m)  Weight: 134 lb 6.4 oz (60.963 kg)  SpO2: 94%    Gen: Anxious female, well-nourished, in no distress,    ENT: No lesions,  mouth clear,  oropharynx clear, +++postnasal drip, nasal turbinate erythema and edema without purulence  Neck: No JVD, no TMG, no carotid bruits  Lungs: No use of accessory muscles, no dullness to percussion, clear without rales or rhonchi  Cardiovascular: RRR, heart sounds normal, no murmur or gallops, no peripheral edema  Abdomen: soft and NT, no HSM,  BS normal  Musculoskeletal: No deformities, no cyanosis or  clubbing  Neuro: alert, non focal  Skin: Warm, no lesions or rashes  Spirometry 07/18/2012 normal      Assessment & Plan:   Cough History of cyclical cough which has been labeled as due to upper airway instability postnasal drip syndrome exacerbated by reflux disease. Note spirometry has been normal and remained so on this visit. The patient has been cycled on and off Advair with variable effect on the cough. The patient has been seen by multiple pulmonary providers in the pulmonary clinic. The patient wishes to reestablish in this clinic for a final opinion. By my review the patient's records and her symptom complex and examination lower airway inflammation needs to stay in the differential  diagnosis. To this end I believe and inhaled steroid would be appropriate for this patient's care however Advair is likely going to contribute to upper airway irritation. Furthermore chronic recurrent sinusitis has been an issue for this patient but this now appears to be resolved.  No chronic allergic rhinitis as an ongoing issue in this patient  Plan Stop Advair Start Asmanex--2 puffs daily Start Flonase--2 puffs each nostril daily Use albuterol as needed No further antibiotics See Dr. Delford Field again as needed in 2 months Note flu vaccine was administered at this visit   Updated Medication List Outpatient Encounter Prescriptions as of 07/18/2012  Medication Sig Dispense Refill  . acetaminophen (TYLENOL) 650 MG CR tablet Per bottle      . albuterol (VENTOLIN HFA) 108 (90 BASE) MCG/ACT inhaler Inhale 2 puffs into the lungs every 6 (six) hours as needed. For shortness of breath      . amLODipine (NORVASC) 5 MG tablet Take 1 tablet (5 mg total) by mouth daily.  90 tablet  3  . aspirin 81 MG tablet Take 81 mg by mouth daily.        . calcium citrate-vitamin D (CITRACAL+D) 315-200 MG-UNIT per tablet Take 1 tablet by mouth 2 (two) times daily.       . clonazePAM (KLONOPIN) 0.5 MG tablet Take 0.5 mg by  mouth at bedtime as needed.       . fexofenadine (ALLEGRA) 180 MG tablet Take 90 mg by mouth daily.       . hydrochlorothiazide (HYDRODIURIL) 25 MG tablet Take 25 mg by mouth. 1/2 daily      . levothyroxine (SYNTHROID, LEVOTHROID) 125 MCG tablet Take 125 mcg by mouth daily.        . simvastatin (ZOCOR) 20 MG tablet Take 1 tablet (20 mg total) by mouth every evening.  90 tablet  3  . telmisartan (MICARDIS) 40 MG tablet Take 1 tablet (40 mg total) by mouth daily.  90 tablet  4  . DISCONTD: Fluticasone-Salmeterol (ADVAIR) 250-50 MCG/DOSE AEPB Inhale 1 puff into the lungs 2 (two) times daily. As needed      . fluticasone (FLONASE) 50 MCG/ACT nasal spray Place 2 sprays into the nose daily as needed. For dry nasal passages      . fluticasone (FLONASE) 50 MCG/ACT nasal spray Place 2 sprays into the nose daily.  16 g  3  . mometasone (ASMANEX 60 METERED DOSES) 220 MCG/INH inhaler Inhale 2 puffs into the lungs daily.  1 Inhaler  3  . Probiotic Product (PROBIOTIC PO) Take 1 capsule by mouth daily.       [name] DISCONTD: cefdinir (OMNICEF) 300 MG capsule Take 1 capsule (300 mg total) by mouth 2 (two) times daily.  14 capsule  0

## 2012-07-19 NOTE — Assessment & Plan Note (Addendum)
History of cyclical cough which has been labeled as due to upper airway instability postnasal drip syndrome exacerbated by reflux disease. Note spirometry has been normal and remained so on this visit. The patient has been cycled on and off Advair with variable effect on the cough. The patient has been seen by multiple pulmonary providers in the pulmonary clinic. The patient wishes to reestablish in this clinic for a final opinion. By my review the patient's records and her symptom complex and examination lower airway inflammation needs to stay in the differential diagnosis. To this end I believe and inhaled steroid would be appropriate for this patient's care however Advair is likely going to contribute to upper airway irritation. Furthermore chronic recurrent sinusitis has been an issue for this patient but this now appears to be resolved.  No chronic allergic rhinitis as an ongoing issue in this patient  Plan Stop Advair Start Asmanex--2 puffs daily Start Flonase--2 puffs each nostril daily Use albuterol as needed No further antibiotics See Dr. Delford Field again as needed in 2 months Note flu vaccine was administered at this visit

## 2012-07-20 ENCOUNTER — Telehealth: Payer: Self-pay | Admitting: Critical Care Medicine

## 2012-07-20 NOTE — Telephone Encounter (Signed)
Spoke with pt. She is confused b/c she thought that there are 14 puffs in the asmanex sample, and after using 4 puffs "counter says 1 over 0". I explained that this means 10 doses left, not 1. Pt verbalized understanding and states nothing further needed.

## 2012-07-21 ENCOUNTER — Telehealth: Payer: Self-pay | Admitting: *Deleted

## 2012-07-21 NOTE — Telephone Encounter (Signed)
This RN spoke with pt today per her request for referral for second opinion regarding MGUS.  Per discussion pt declined seeing local MD as a possibility again stating desire to be seen at [facility] not at another facility.  This RN called to [facility] and spoke with Jiles Crocker in new patient referral. Discussed pt's diagnoses of MGUS and her desire for second opinion at their facility. Belinda requested records to be faxed for review for possible appointment.  Records faxed [phone]. Belinda's direct return call number given as [phone].  Belinda called this RN post review appointment scheduled with Dr Marissa Calamity for 08/08/2012 at 9am. Massie Bougie will contact pt with information and directions.

## 2012-07-24 ENCOUNTER — Telehealth: Payer: Self-pay | Admitting: *Deleted

## 2012-07-24 NOTE — Telephone Encounter (Signed)
Message left by pt inquiring above referral to [facility].  This RN returned call and obtained identified answering machine. Detailed message left per referral information of date, time, and name of MD including that [facility] new pt coordinator will be contacting her Jiles Crocker ).  This RN's name and return call number given.

## 2012-08-01 ENCOUNTER — Telehealth: Payer: Self-pay | Admitting: Critical Care Medicine

## 2012-08-01 NOTE — Telephone Encounter (Signed)
Called, spoke with pt.  States she had her home tested for Radon.  Test came back with high levels of Radon in her home at approx 6.6.  She would like Dr. Delford Field to be aware of this and states she is in the process of getting this fixed.  States to get it fixed she has to leave her windows and doors cracked.  Since this, on Saturday started having a sore throat, sinus congestion/pressure, and chest pressure.  Reports she picked up zpak rx and started in on Sunday.  She is now feeling better.  Pt states she doesn't think she needs anything else right now as she is feeling better.  She would just like Dr. Delford Field to be aware of this as well and states she will call back if symptoms become worse or do not improve further.  Will route to Dr. Delford Field.

## 2012-08-01 NOTE — Telephone Encounter (Signed)
noted 

## 2012-08-02 NOTE — Telephone Encounter (Signed)
Have noted this.

## 2012-08-03 ENCOUNTER — Ambulatory Visit: Payer: Medicare Other | Admitting: Pulmonary Disease

## 2012-08-07 ENCOUNTER — Telehealth: Payer: Self-pay | Admitting: Cardiology

## 2012-08-07 NOTE — Telephone Encounter (Signed)
Patient saw on TV today that ALL statin drugs are harmful, wants to know if there is something else she can take with her diseased kidney. Will forward to  Dr. Patty Sermons for review

## 2012-08-07 NOTE — Telephone Encounter (Signed)
New problem:   Going to [employer] .  Date when she started high blood pressure. New alert on statin drugs is harmful to your body.  - Zocor. Please advise.

## 2012-08-07 NOTE — Telephone Encounter (Signed)
No answer, will try back tomorrow

## 2012-08-07 NOTE — Telephone Encounter (Signed)
All statins are not harmful to the body.  Crestor may have fewer side effects than Zocor and it would be reasonable to switch to low dose Crestor 5 mg daily to help protect her coronary arteries.

## 2012-08-08 DIAGNOSIS — E039 Hypothyroidism, unspecified: Secondary | ICD-10-CM | POA: Diagnosis not present

## 2012-08-08 DIAGNOSIS — D472 Monoclonal gammopathy: Secondary | ICD-10-CM | POA: Diagnosis not present

## 2012-08-08 DIAGNOSIS — I10 Essential (primary) hypertension: Secondary | ICD-10-CM | POA: Diagnosis not present

## 2012-08-08 DIAGNOSIS — M25559 Pain in unspecified hip: Secondary | ICD-10-CM | POA: Diagnosis not present

## 2012-08-08 DIAGNOSIS — K219 Gastro-esophageal reflux disease without esophagitis: Secondary | ICD-10-CM | POA: Diagnosis not present

## 2012-08-09 NOTE — Telephone Encounter (Signed)
Advised patient, she does not want to change at this time.

## 2012-08-10 DIAGNOSIS — F411 Generalized anxiety disorder: Secondary | ICD-10-CM | POA: Diagnosis not present

## 2012-08-18 ENCOUNTER — Encounter: Payer: Self-pay | Admitting: Cardiology

## 2012-08-18 ENCOUNTER — Encounter: Payer: Self-pay | Admitting: Adult Health

## 2012-08-18 ENCOUNTER — Ambulatory Visit (INDEPENDENT_AMBULATORY_CARE_PROVIDER_SITE_OTHER): Payer: Medicare Other | Admitting: Adult Health

## 2012-08-18 ENCOUNTER — Telehealth: Payer: Self-pay | Admitting: Critical Care Medicine

## 2012-08-18 VITALS — BP 122/68 | HR 77 | Temp 97.2°F | Ht 61.0 in | Wt 136.2 lb

## 2012-08-18 DIAGNOSIS — J069 Acute upper respiratory infection, unspecified: Secondary | ICD-10-CM | POA: Diagnosis not present

## 2012-08-18 DIAGNOSIS — C73 Malignant neoplasm of thyroid gland: Secondary | ICD-10-CM | POA: Diagnosis not present

## 2012-08-18 MED ORDER — AZITHROMYCIN 250 MG PO TABS: ORAL_TABLET | ORAL | Status: AC

## 2012-08-18 NOTE — Progress Notes (Signed)
  Subjective:    Patient ID: [name], female    DOB: 1940-11-16, 71 y.o.   MRN: [id number] HPI 38 yowf never smoker with ? H/o asthma/ allergies to dust mold  and referred by Dr Clarene Duke 10/21/2011 for pulmonary evaluation for difficult to control asthma   08/18/2012 Acute OV  Patient presents for an acute office visit for a 6 day history of nasal congestion, sinus pain and pressure, postnasal drainage, and tickle in her throat. She is using  over-the-counter saline nasal rinses without much relief. She is very concerned that she has an upcoming bone marrow biopsy. Next week and does not want to be sick for the past. Mucus is mainly white . She denies any fever or hemoptysis, orthopnea, cough, or wheezing Worse in am and late at night.     ROS:   Constitutional:   No  weight loss, night sweats,  Fevers, chills, fatigue, or  lassitude.  HEENT:   No headaches,  Difficulty swallowing,  Tooth/dental problems, or  Sore throat,                No sneezing, itching, ear ache,  +nasal congestion, post nasal drip,   CV:  No chest pain,  Orthopnea, PND, swelling in lower extremities, anasarca, dizziness, palpitations, syncope.   GI  No heartburn, indigestion, abdominal pain, nausea, vomiting, diarrhea, change in bowel habits, loss of appetite, bloody stools.   Resp: No shortness of breath with exertion or at rest.  No excess mucus, no productive cough,  No non-productive cough,  No coughing up of blood.  No change in color of mucus.  No wheezing.  No chest wall deformity  Skin: no rash or lesions.  GU: no dysuria, change in color of urine, no urgency or frequency.  No flank pain, no hematuria   MS:  No joint pain or swelling.  No decreased range of motion.  No back pain.  Psych:  No change in mood or affect. No depression or anxiety.  No memory loss.       EXAM :  Gen: Anxious female, well-nourished, in no distress,    ENT: No lesions,  mouth clear,  oropharynx clear, +++postnasal  drip, nasal turbinate erythema and edema without purulence, max sinus tenderness L>R   Neck: No JVD, no TMG, no carotid bruits  Lungs: No use of accessory muscles, no dullness to percussion, clear without rales or rhonchi  Cardiovascular: RRR, heart sounds normal, no murmur or gallops, no peripheral edema  Abdomen: soft and NT, no HSM,  BS normal  Musculoskeletal: No deformities, no cyanosis or clubbing  Neuro: alert, non focal  Skin: Warm, no lesions or rashes          Spirometry 07/18/2012 normal

## 2012-08-18 NOTE — Patient Instructions (Addendum)
Zpack take as directed. -to have on hold if symptoms worsen with discolored mucus.  Mucinex DM Twice daily   Saline nasal rinses As needed   Nasonex 2 puffs Twice daily  Until sample is gone.  Please contact office for sooner follow up if symptoms do not improve or worsen or seek emergency care  follow up Dr. Delford Field   In 2 months

## 2012-08-18 NOTE — Assessment & Plan Note (Signed)
Acute URI /Rhinitis   Plan Zpack take as directed. -to have on hold if symptoms worsen with discolored mucus.  Mucinex DM Twice daily   Saline nasal rinses As needed   Nasonex 2 puffs Twice daily  Until sample is gone.  Please contact office for sooner follow up if symptoms do not improve or worsen or seek emergency care  follow up Dr. Delford Field   In 1  Month as planned

## 2012-08-18 NOTE — Telephone Encounter (Signed)
Called, spoke with pt.  Reports over the weekend she was outside blowing leaves.  Since then, having problems with "sinus" that is getting worse.  C/o having swelling on left side of face and "sagging" under left eye.  Has sinus pressure in this area, sinus drainage, and left side nasal congestion.  Denies trouble breathing, swallowing, chest tightness, chest pain, or wheezing.  Requesting OV this am.  OV scheduled with TP this am at 10:30 -- pt aware.

## 2012-08-21 ENCOUNTER — Encounter: Payer: Self-pay | Admitting: Cardiology

## 2012-08-24 ENCOUNTER — Ambulatory Visit: Payer: Medicare Other | Admitting: Dietician

## 2012-08-24 DIAGNOSIS — D472 Monoclonal gammopathy: Secondary | ICD-10-CM | POA: Diagnosis not present

## 2012-08-24 DIAGNOSIS — C9000 Multiple myeloma not having achieved remission: Secondary | ICD-10-CM | POA: Diagnosis not present

## 2012-09-06 ENCOUNTER — Telehealth: Payer: Self-pay

## 2012-09-06 NOTE — Telephone Encounter (Signed)
Received call from pt stating that she just saw Dr. 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  Informed her will relay this information to Dr. Darnelle Catalan, and office will call her back with an appt, but she should call back if she has not heard anything in a couple of weeks.  Pt verbalizes understanding.

## 2012-09-13 DIAGNOSIS — Z85820 Personal history of malignant melanoma of skin: Secondary | ICD-10-CM | POA: Diagnosis not present

## 2012-09-13 DIAGNOSIS — Z85828 Personal history of other malignant neoplasm of skin: Secondary | ICD-10-CM | POA: Diagnosis not present

## 2012-09-13 DIAGNOSIS — L821 Other seborrheic keratosis: Secondary | ICD-10-CM | POA: Diagnosis not present

## 2012-09-18 DIAGNOSIS — I10 Essential (primary) hypertension: Secondary | ICD-10-CM | POA: Diagnosis not present

## 2012-09-18 DIAGNOSIS — C73 Malignant neoplasm of thyroid gland: Secondary | ICD-10-CM | POA: Diagnosis not present

## 2012-09-18 DIAGNOSIS — M818 Other osteoporosis without current pathological fracture: Secondary | ICD-10-CM | POA: Diagnosis not present

## 2012-09-18 DIAGNOSIS — E890 Postprocedural hypothyroidism: Secondary | ICD-10-CM | POA: Diagnosis not present

## 2012-09-19 ENCOUNTER — Other Ambulatory Visit: Payer: Self-pay | Admitting: *Deleted

## 2012-09-19 ENCOUNTER — Encounter: Payer: Self-pay | Admitting: Critical Care Medicine

## 2012-09-19 ENCOUNTER — Telehealth: Payer: Self-pay | Admitting: Cardiology

## 2012-09-19 ENCOUNTER — Ambulatory Visit (INDEPENDENT_AMBULATORY_CARE_PROVIDER_SITE_OTHER): Payer: Medicare Other | Admitting: Critical Care Medicine

## 2012-09-19 VITALS — BP 110/70 | HR 77 | Temp 97.5°F | Ht 61.0 in | Wt 131.4 lb

## 2012-09-19 DIAGNOSIS — J45902 Unspecified asthma with status asthmaticus: Secondary | ICD-10-CM

## 2012-09-19 DIAGNOSIS — J019 Acute sinusitis, unspecified: Secondary | ICD-10-CM

## 2012-09-19 DIAGNOSIS — I119 Hypertensive heart disease without heart failure: Secondary | ICD-10-CM

## 2012-09-19 DIAGNOSIS — D472 Monoclonal gammopathy: Secondary | ICD-10-CM

## 2012-09-19 MED ORDER — AMLODIPINE BESYLATE 5 MG PO TABS: 5.0000 mg | ORAL_TABLET | Freq: Every day | ORAL | Status: DC

## 2012-09-19 MED ORDER — FEXOFENADINE HCL 180 MG PO TABS: ORAL_TABLET | ORAL | Status: DC

## 2012-09-19 MED ORDER — AZITHROMYCIN 250 MG PO TABS: 250.0000 mg | ORAL_TABLET | Freq: Every day | ORAL | Status: DC

## 2012-09-19 MED ORDER — MOMETASONE FUROATE 220 MCG/INH IN AEPB: 2.0000 | INHALATION_SPRAY | Freq: Every day | RESPIRATORY_TRACT | Status: DC

## 2012-09-19 MED ORDER — FLUTICASONE PROPIONATE 50 MCG/ACT NA SUSP: 2.0000 | Freq: Every day | NASAL | Status: DC

## 2012-09-19 NOTE — Telephone Encounter (Signed)
New Problem:    Patient called in needing a 90 day refill of heramLODipine (NORVASC) 5 MG tablet.  Please call back once the order has been placed.

## 2012-09-19 NOTE — Telephone Encounter (Signed)
RX sent into pharmacy, [facility] for pt. CTuck

## 2012-09-19 NOTE — Telephone Encounter (Signed)
Patient called and left message for refill to be sent in on Amlodopine.  Stated that refill was sent to [facility] but she needed it sent to [facility] for 90 day supply for 1 year.  Please resend to [facility]. / tgs

## 2012-09-19 NOTE — Patient Instructions (Signed)
Azithromycin 250mg  Take two once then one daily until gone Resume asmanex two puff daily Resume flonase two puff ea nostril daily HOLD allegra for 10days then can resume Use saline nasal rinse in nostrils Return 2 months

## 2012-09-19 NOTE — Assessment & Plan Note (Signed)
Moderate persistent asthma with acute flare due to to the fact that the patient is not using inhaled corticosteroid on a regular basis and now associated upper respiratory infection with mild acute sinusitis Plan Hold antihistamine for 10 days then resume Azithromycin for 5 days Resume Asmanex 2 puff daily Resume Flonase 2 puff each nostril daily Saline nasal rinse daily Return 2 months The patient was reinstructed as to proper use of her inhaled medications and the importance of using and maintaining a control her therapy on a daily basis

## 2012-09-19 NOTE — Progress Notes (Signed)
Subjective:    Patient ID: [name], female    DOB: June 05, 1941, 71 y.o.   MRN: [id number]  HPI  71 y.o. WF  never smoker with  H/o asthma/ allergies to dust mold  and referred by Dr Clarene Duke 10/21/2011 for pulmonary evaluation for difficult to control asthma    09/19/2012 Pt seen by np 11/15 for acute OV URI>>Rx nasonex /zpak.  Pt has previously seen VS/MW/KC/LGonz. Hx of asthma with cough variation.   Symptoms now:  Was ok until rain overweekend and developed more wheeze.  Throat is sore. Notes pndrip.  Notes some sinus pressure. Notes sl wheeze.  Using SABA 5-6 x in the past Pt is now on asmanex.    PUL ASTHMA HISTORY 09/19/2012  Symptoms Daily  Nighttime awakenings 0-2/month  Interference with activity Some limitations  SABA use > 2 days/wk--not > 1 x/day  Exacerbations requiring oral steroids 0-1 / year    Past Medical History  Diagnosis Date  . 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 History of blood clots     LEG  . Stroke   . Hernia   . MGUS (monoclonal gammopathy of unknown significance)   . Chronic kidney disease   . Hypertension   . Hyperlipidemia   . Thyroid disease   . Arthritis   . Mitral valve prolapse   . Anxiety   . Perforated bowel   . Peritonitis   . Diverticulosis     Pt reported on 03/15/12  . Cancer     THYROID, SKIN, NOSE  . Melanoma   . MGUS (monoclonal gammopathy of unknown significance)      Family History  Problem Relation Age of Onset  . Stroke Mother   . Asthma Father   . Diabetes Father   . Cancer Father   . Cancer Sister   . Diabetes Sister   . Cancer Brother      History   Social History  . Marital Status: Divorced    Spouse Name: N/A    Number of Children: 2  . Years of Education: N/A   Occupational History  . Retired     Holiday representative   Social History Main Topics  . Smoking status: Never Smoker   . Smokeless tobacco: Never Used  . Alcohol Use: No  . Drug Use: No  . Sexually Active: Not on file   Other  Topics Concern  . Not on file   Social History Narrative  . No narrative on file     Allergies  Allergen Reactions  . Aldactone (Spironolactone)     Burning in stomach  . Hydrocodone   . Levofloxacin   . Quinolones   . Sertraline Hcl   . Sudafed (Pseudoephedrine Hcl)   . Ultram (Tramadol)     Headaches      Outpatient Prescriptions Prior to Visit  Medication Sig Dispense Refill  . acetaminophen (TYLENOL) 650 MG CR tablet Per bottle      . albuterol (VENTOLIN HFA) 108 (90 BASE) MCG/ACT inhaler Inhale 2 puffs into the lungs every 6 (six) hours as needed. For shortness of breath      . amLODipine (NORVASC) 5 MG tablet Take 1 tablet (5 mg total) by mouth daily.  90 tablet  3  . aspirin 81 MG tablet Take 81 mg by mouth daily.        . calcium citrate-vitamin D (CITRACAL+D) 315-200 MG-UNIT per tablet Take 1 tablet by mouth 2 (two) times  daily.       . clonazePAM (KLONOPIN) 0.5 MG tablet Take 0.5 mg by mouth at bedtime as needed.       . hydrochlorothiazide (HYDRODIURIL) 25 MG tablet Take 25 mg by mouth. 1/2 daily      . levothyroxine (SYNTHROID, LEVOTHROID) 125 MCG tablet Take 125 mcg by mouth daily.        . Probiotic Product (PROBIOTIC PO) Take 1 capsule by mouth daily.       . simvastatin (ZOCOR) 20 MG tablet Take 1 tablet (20 mg total) by mouth every evening.  90 tablet  3  . telmisartan (MICARDIS) 40 MG tablet Take 1 tablet (40 mg total) by mouth daily.  90 tablet  4  . [DISCONTINUED] fexofenadine (ALLEGRA) 180 MG tablet Take 90 mg by mouth daily.       . [DISCONTINUED] fluticasone (FLONASE) 50 MCG/ACT nasal spray Place 2 sprays into the nose daily.  16 g  3  . [DISCONTINUED] mometasone (ASMANEX 60 METERED DOSES) 220 MCG/INH inhaler Inhale 2 puffs into the lungs daily.  1 Inhaler  3   Last reviewed on 09/19/2012  9:13 AM by Storm Frisk, MD     Review of Systems Constitutional:   No  weight loss, night sweats,  Fevers, chills, fatigue, lassitude. HEENT:   No headaches,   Difficulty swallowing,  Tooth/dental problems,  +++Sore throat,                No sneezing, itching, ear ache,+++ nasal congestion, +++post nasal drip,   CV:  No chest pain,  Orthopnea, PND, swelling in lower extremities, anasarca, dizziness, palpitations  GI  No heartburn, indigestion, abdominal pain, nausea, vomiting, diarrhea, change in bowel habits, loss of appetite  Resp: Notes shortness of breath with exertion not at rest.  No excess mucus, notes productive cough,  No non-productive cough,  No coughing up of blood.  No change in color of mucus.  No wheezing.  No chest wall deformity  Skin: no rash or lesions.  GU: no dysuria, change in color of urine, no urgency or frequency.  No flank pain.  MS:  No joint pain or swelling.  No decreased range of motion.  No back pain.  Psych:  No change in mood or affect. No depression or anxiety.  No memory loss.     Objective:   Physical Exam Filed Vitals:   09/19/12 0854  BP: 110/70  Pulse: 77  Temp: 97.5 F (36.4 C)  TempSrc: Oral  Height: 5\' 1"  (1.549 m)  Weight: 131 lb 6.4 oz (59.603 kg)  SpO2: 98%    Gen: Pleasant, well-nourished, in no distress,  normal affect  ENT: No lesions,  mouth clear,  oropharynx clear,  moderate postnasal drip, right greater than left nares purulence  Neck: No JVD, no TMG, no carotid bruits  Lungs: No use of accessory muscles, no dullness to percussion, distant breath sounds  Cardiovascular: RRR, heart sounds normal, no murmur or gallops, no peripheral edema  Abdomen: soft and NT, no HSM,  BS normal  Musculoskeletal: No deformities, no cyanosis or clubbing  Neuro: alert, non focal  Skin: Warm, no lesions or rashes          Assessment & Plan:   Moderate persistent asthma with primary cough variation Moderate persistent asthma with acute flare due to to the fact that the patient is not using inhaled corticosteroid on a regular basis and now associated upper respiratory infection with  mild acute sinusitis Plan  Hold antihistamine for 10 days then resume Azithromycin for 5 days Resume Asmanex 2 puff daily Resume Flonase 2 puff each nostril daily Saline nasal rinse daily Return 2 months The patient was reinstructed as to proper use of her inhaled medications and the importance of using and maintaining a control her therapy on a daily basis   Updated Medication List Outpatient Encounter Prescriptions as of 09/19/2012  Medication Sig Dispense Refill  . acetaminophen (TYLENOL) 650 MG CR tablet Per bottle      . albuterol (VENTOLIN HFA) 108 (90 BASE) MCG/ACT inhaler Inhale 2 puffs into the lungs every 6 (six) hours as needed. For shortness of breath      . amLODipine (NORVASC) 5 MG tablet Take 1 tablet (5 mg total) by mouth daily.  90 tablet  3  . aspirin 81 MG tablet Take 81 mg by mouth daily.        . calcium citrate-vitamin D (CITRACAL+D) 315-200 MG-UNIT per tablet Take 1 tablet by mouth 2 (two) times daily.       . Cholecalciferol (VITAMIN D PO) Take by mouth. Vitamin D3 1000 mg once a day by mouth      . clonazePAM (KLONOPIN) 0.5 MG tablet Take 0.5 mg by mouth at bedtime as needed.       . fexofenadine (ALLEGRA) 180 MG tablet HOLD for 10days then can resume      . fluticasone (FLONASE) 50 MCG/ACT nasal spray Place 2 sprays into the nose daily.  16 g  6  . hydrochlorothiazide (HYDRODIURIL) 25 MG tablet Take 25 mg by mouth. 1/2 daily      . levothyroxine (SYNTHROID, LEVOTHROID) 125 MCG tablet Take 125 mcg by mouth daily.        . mometasone (ASMANEX 60 METERED DOSES) 220 MCG/INH inhaler Inhale 2 puffs into the lungs daily.  1 Inhaler  6  . Probiotic Product (PROBIOTIC PO) Take 1 capsule by mouth daily.       . simvastatin (ZOCOR) 20 MG tablet Take 1 tablet (20 mg total) by mouth every evening.  90 tablet  3  . telmisartan (MICARDIS) 40 MG tablet Take 1 tablet (40 mg total) by mouth daily.  90 tablet  4  . traMADol (ULTRAM) 50 MG tablet Take 50 mg by mouth every 6 (six)  hours as needed.      . [DISCONTINUED] fexofenadine (ALLEGRA) 180 MG tablet Take 90 mg by mouth daily.       . [DISCONTINUED] fluticasone (FLONASE) 50 MCG/ACT nasal spray Place 2 sprays into the nose daily.  16 g  3  . [DISCONTINUED] mometasone (ASMANEX 60 METERED DOSES) 220 MCG/INH inhaler Inhale 2 puffs into the lungs daily.  1 Inhaler  3  . azithromycin (ZITHROMAX) 250 MG tablet Take 1 tablet (250 mg total) by mouth daily. Take two once then one daily until gone  6 each  0

## 2012-09-19 NOTE — Progress Notes (Signed)
This RN spoke with pt per her call following up on request per bone marrow biopsy done late Nov at [facility].  Obtained from MD post his review with noted recommendation per Dr Marissa Calamity for labs to be monitored q 3 months and request for PET/CT for baseline evaluation for lytic lesions.  This RN attempted to call pt to discuss above. Obtained identified VM - general message left for pt to return call to this RN.  Orders entered per request of [facility].

## 2012-09-20 ENCOUNTER — Other Ambulatory Visit: Payer: Self-pay | Admitting: Emergency Medicine

## 2012-09-20 ENCOUNTER — Encounter: Payer: Medicare Other | Admitting: Nutrition

## 2012-09-20 ENCOUNTER — Telehealth: Payer: Self-pay | Admitting: Nutrition

## 2012-09-20 DIAGNOSIS — J3089 Other allergic rhinitis: Secondary | ICD-10-CM | POA: Diagnosis not present

## 2012-09-20 DIAGNOSIS — J45909 Unspecified asthma, uncomplicated: Secondary | ICD-10-CM | POA: Diagnosis not present

## 2012-09-20 NOTE — Telephone Encounter (Signed)
Has multiple dietary questions regarding her diabetes and her kidney disease. She is seeking assistance on food she can eat and methods of cooking that are appropriate for her disease conditions. Patient has been [facility] in the past. I have referred her back to [facility] for continued diet education.

## 2012-09-21 ENCOUNTER — Telehealth: Payer: Self-pay | Admitting: Critical Care Medicine

## 2012-09-21 ENCOUNTER — Other Ambulatory Visit: Payer: Self-pay | Admitting: *Deleted

## 2012-09-21 DIAGNOSIS — I119 Hypertensive heart disease without heart failure: Secondary | ICD-10-CM

## 2012-09-21 MED ORDER — PREDNISONE 10 MG PO TABS: ORAL_TABLET | ORAL | Status: DC

## 2012-09-21 MED ORDER — AMLODIPINE BESYLATE 5 MG PO TABS: 5.0000 mg | ORAL_TABLET | Freq: Every day | ORAL | Status: DC

## 2012-09-21 MED ORDER — AMOXICILLIN-POT CLAVULANATE 875-125 MG PO TABS: 1.0000 | ORAL_TABLET | Freq: Two times a day (BID) | ORAL | Status: DC

## 2012-09-21 NOTE — Telephone Encounter (Signed)
rx augmentin 875mg  bid x 10days . Start this now and finish azithromycin Rx prednisone 10mg  Take 4 for three days 3 for three days 2 for three days 1 for three days and stop #30 Needs OV next week with TP prior to Christmas for recheck

## 2012-09-21 NOTE — Telephone Encounter (Signed)
The pt says she believes she is getting worse, instead of, better. She still has 2 more days on the abx and has been follow-ing PW recs from last OV. Still c/o a dry cough, nasal congestion, PND, and hoarseness. She says her  Chest "feels raw". She denies an fever, sore throat or wheezing. PW, pls advsie. Allergies  Allergen Reactions  . Aldactone (Spironolactone)     Burning in stomach  . Hydrocodone   . Levofloxacin   . Quinolones   . Sertraline Hcl   . Sudafed (Pseudoephedrine Hcl)   . Ultram (Tramadol)     Headaches

## 2012-09-21 NOTE — Telephone Encounter (Signed)
Rxs were sent to [facility] with pt and notified of recs per PW She verbalized understanding and ov with TP set for 09/25/12 at 10:30 am

## 2012-09-22 ENCOUNTER — Telehealth: Payer: Self-pay | Admitting: Oncology

## 2012-09-22 NOTE — Telephone Encounter (Signed)
RX sent in on 12/19 by [facility].

## 2012-09-22 NOTE — Telephone Encounter (Signed)
S/W THE PT REGARDING HER LAB APPT IN FEB 2014

## 2012-09-25 ENCOUNTER — Encounter: Payer: Self-pay | Admitting: Adult Health

## 2012-09-25 ENCOUNTER — Ambulatory Visit (INDEPENDENT_AMBULATORY_CARE_PROVIDER_SITE_OTHER): Payer: Medicare Other | Admitting: Adult Health

## 2012-09-25 VITALS — BP 118/74 | HR 73 | Temp 97.1°F | Ht 61.0 in | Wt 130.8 lb

## 2012-09-25 DIAGNOSIS — J45909 Unspecified asthma, uncomplicated: Secondary | ICD-10-CM | POA: Diagnosis not present

## 2012-09-25 NOTE — Patient Instructions (Addendum)
Begin Augmentin Twice daily  As directed-this prescription is at your pharmacy .  Saline nasal rinses As needed   Finish Prednisone taper over next week.  Fluids and rest  follow up Dr. Delford Field  In 2 months  Please contact office for sooner follow up if symptoms do not improve or worsen or seek emergency care

## 2012-10-02 ENCOUNTER — Encounter (HOSPITAL_COMMUNITY)
Admission: RE | Admit: 2012-10-02 | Discharge: 2012-10-02 | Disposition: A | Discharge status: Home / Self Care | Payer: Medicare Other | Source: Ambulatory Visit | Attending: Oncology | Admitting: Oncology

## 2012-10-02 DIAGNOSIS — C439 Malignant melanoma of skin, unspecified: Secondary | ICD-10-CM | POA: Diagnosis not present

## 2012-10-02 DIAGNOSIS — D472 Monoclonal gammopathy: Secondary | ICD-10-CM | POA: Insufficient documentation

## 2012-10-02 DIAGNOSIS — C73 Malignant neoplasm of thyroid gland: Secondary | ICD-10-CM | POA: Diagnosis not present

## 2012-10-02 LAB — GLUCOSE, CAPILLARY: Glucose-Capillary: 99 mg/dL (ref 70–99)

## 2012-10-02 MED ORDER — FLUDEOXYGLUCOSE F - 18 (FDG) INJECTION
18.2000 | Freq: Once | INTRAVENOUS | Status: AC | PRN
  Administered 2012-10-02: 18.2 via INTRAVENOUS

## 2012-10-03 ENCOUNTER — Other Ambulatory Visit: Payer: Self-pay | Admitting: *Deleted

## 2012-10-03 DIAGNOSIS — H353190 Nonexudative age-related macular degeneration, unspecified eye, stage unspecified: Secondary | ICD-10-CM | POA: Diagnosis not present

## 2012-10-03 DIAGNOSIS — E890 Postprocedural hypothyroidism: Secondary | ICD-10-CM | POA: Diagnosis not present

## 2012-10-03 DIAGNOSIS — C73 Malignant neoplasm of thyroid gland: Secondary | ICD-10-CM | POA: Diagnosis not present

## 2012-10-03 DIAGNOSIS — T66XXXA Radiation sickness, unspecified, initial encounter: Secondary | ICD-10-CM | POA: Diagnosis not present

## 2012-10-03 DIAGNOSIS — D472 Monoclonal gammopathy: Secondary | ICD-10-CM | POA: Diagnosis not present

## 2012-10-03 DIAGNOSIS — H4310 Vitreous hemorrhage, unspecified eye: Secondary | ICD-10-CM | POA: Diagnosis not present

## 2012-10-03 DIAGNOSIS — Z961 Presence of intraocular lens: Secondary | ICD-10-CM | POA: Diagnosis not present

## 2012-10-03 DIAGNOSIS — H3500 Unspecified background retinopathy: Secondary | ICD-10-CM | POA: Diagnosis not present

## 2012-10-03 NOTE — Progress Notes (Signed)
Subjective:    Patient ID: [name], female    DOB: [birth year], 71 y.o.   MRN: [id number]  HPI   71 y.o. WF  never smoker with  H/o asthma/ allergies to dust mold  and referred by Dr Clarene Duke 10/21/2011 for pulmonary evaluation for difficult to control asthma      09/25/12 follow up  Complains of 1 week follow up - reports some better, still hoarse, some HA w/ the prednisone, tightness in chest.  currently taking 30mg  prednisone daily, finished the zpak.  did not pick up the augmentin. Seen 1 week ago with bronchitis , given zpack without much help  Called in augmentin  has not picked up yet.  No hemoptyiss or chest pain  No edema.    PUL ASTHMA HISTORY 09/19/2012  Symptoms Daily  Nighttime awakenings 0-2/month  Interference with activity Some limitations  SABA use > 2 days/wk--not > 1 x/day  Exacerbations requiring oral steroids 0-1 / year    Past Medical History  Diagnosis Date  . 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 History of blood clots     LEG  . Stroke   . Hernia   . MGUS (monoclonal gammopathy of unknown significance)   . Chronic kidney disease   . Hypertension   . Hyperlipidemia   . Thyroid disease   . Arthritis   . Mitral valve prolapse   . Anxiety   . Perforated bowel   . Peritonitis   . Diverticulosis     Pt reported on 03/15/12  . Cancer     THYROID, SKIN, NOSE  . Melanoma   . MGUS (monoclonal gammopathy of unknown significance)      Family History  Problem Relation Age of Onset  . Stroke Mother   . Asthma Father   . Diabetes Father   . Cancer Father   . Cancer Sister   . Diabetes Sister   . Cancer Brother      History   Social History  . Marital Status: Divorced    Spouse Name: N/A    Number of Children: 2  . Years of Education: N/A   Occupational History  . Retired     Holiday representative   Social History Main Topics  . Smoking status: Never Smoker   . Smokeless tobacco: Never Used  . Alcohol Use: No  . Drug Use: No  .  Sexually Active: Not on file   Other Topics Concern  . Not on file   Social History Narrative  . No narrative on file     Allergies  Allergen Reactions  . Aldactone (Spironolactone)     Burning in stomach  . Hydrocodone   . Levofloxacin   . Quinolones   . Sertraline Hcl   . Sudafed (Pseudoephedrine Hcl)   . Ultram (Tramadol)     Headaches      Outpatient Prescriptions Prior to Visit  Medication Sig Dispense Refill  . acetaminophen (TYLENOL) 650 MG CR tablet Per bottle      . albuterol (VENTOLIN HFA) 108 (90 BASE) MCG/ACT inhaler Inhale 2 puffs into the lungs every 6 (six) hours as needed. For shortness of breath      . amLODipine (NORVASC) 5 MG tablet Take 1 tablet (5 mg total) by mouth daily.  90 tablet  3  . amoxicillin-clavulanate (AUGMENTIN) 875-125 MG per tablet Take 1 tablet by mouth 2 (two) times daily.  20 tablet  0  . aspirin 81 MG  tablet Take 81 mg by mouth daily.        . calcium citrate-vitamin D (CITRACAL+D) 315-200 MG-UNIT per tablet Take 1 tablet by mouth 2 (two) times daily.       . Cholecalciferol (VITAMIN D PO) Take by mouth. Vitamin D3 1000 mg once a day by mouth      . clonazePAM (KLONOPIN) 0.5 MG tablet Take 0.5 mg by mouth at bedtime as needed.       . fexofenadine (ALLEGRA) 180 MG tablet HOLD for 10days then can resume      . fluticasone (FLONASE) 50 MCG/ACT nasal spray Place 2 sprays into the nose daily.  16 g  6  . hydrochlorothiazide (HYDRODIURIL) 25 MG tablet Take 25 mg by mouth. 1/2 daily      . levothyroxine (SYNTHROID, LEVOTHROID) 125 MCG tablet Take 125 mcg by mouth daily.        . mometasone (ASMANEX 60 METERED DOSES) 220 MCG/INH inhaler Inhale 2 puffs into the lungs daily.  1 Inhaler  6  . predniSONE (DELTASONE) 10 MG tablet 4 x 3, 3 x 3, 2 x 3, 1 x 3  30 tablet  0  . Probiotic Product (PROBIOTIC PO) Take 1 capsule by mouth daily.       . simvastatin (ZOCOR) 20 MG tablet Take 1 tablet (20 mg total) by mouth every evening.  90 tablet  3  .  telmisartan (MICARDIS) 40 MG tablet Take 1 tablet (40 mg total) by mouth daily.  90 tablet  4  . traMADol (ULTRAM) 50 MG tablet Take 50 mg by mouth every 6 (six) hours as needed.      . [DISCONTINUED] azithromycin (ZITHROMAX) 250 MG tablet Take 1 tablet (250 mg total) by mouth daily. Take two once then one daily until gone  6 each  0  Last reviewed on 09/25/2012 11:42 AM by Sherre Lain, MA     Review of Systems  Constitutional:   No  weight loss, night sweats,  Fevers, chills, fatigue, lassitude. HEENT:   No headaches,  Difficulty swallowing,  Tooth/dental problems,  +++Sore throat,                No sneezing, itching, ear ache,+++ nasal congestion, +++post nasal drip,   CV:  No chest pain,  Orthopnea, PND, swelling in lower extremities, anasarca, dizziness, palpitations  GI  No heartburn, indigestion, abdominal pain, nausea, vomiting, diarrhea, change in bowel habits, loss of appetite  Resp: Notes shortness of breath with exertion not at rest.  No wheezing.  No chest wall deformity  Skin: no rash or lesions.  GU: no dysuria, change in color of urine, no urgency or frequency.  No flank pain.  MS:  No joint pain or swelling.  No decreased range of motion.  No back pain.  Psych:  No change in mood or affect. No depression or anxiety.  No memory loss.     Objective:   Physical Exam  Filed Vitals:   09/25/12 1142  BP: 118/74  Pulse: 73  Temp: 97.1 F (36.2 C)  TempSrc: Oral  Height: 5\' 1"  (1.549 m)  Weight: 130 lb 12.8 oz (59.33 kg)  SpO2: 97%    Gen: Pleasant, well-nourished, in no distress,  normal affect  ENT: No lesions,  mouth clear,  oropharynx clear  Neck: No JVD, no TMG, no carotid bruits  Lungs: No use of accessory muscles, no dullness to percussion, distant breath sounds  Cardiovascular: RRR, heart sounds normal, no murmur  or gallops, no peripheral edema  Abdomen: soft and NT, no HSM,  BS normal  Musculoskeletal: No deformities, no cyanosis or  clubbing  Neuro: alert, non focal  Skin: Warm, no lesions or rashes          Assessment & Plan:   Moderate persistent asthma with primary cough variation Flare with bronchitis   Plan Begin Augmentin Twice daily  As directed-this prescription is at your pharmacy .  Saline nasal rinses As needed   Finish Prednisone taper over next week.  Fluids and rest  follow up Dr. Delford Field  In 2 months  Please contact office for sooner follow up if symptoms do not improve or worsen or seek emergency care       Updated Medication List Outpatient Encounter Prescriptions as of 09/25/2012  Medication Sig Dispense Refill  . acetaminophen (TYLENOL) 650 MG CR tablet Per bottle      . albuterol (VENTOLIN HFA) 108 (90 BASE) MCG/ACT inhaler Inhale 2 puffs into the lungs every 6 (six) hours as needed. For shortness of breath      . amLODipine (NORVASC) 5 MG tablet Take 1 tablet (5 mg total) by mouth daily.  90 tablet  3  . amoxicillin-clavulanate (AUGMENTIN) 875-125 MG per tablet Take 1 tablet by mouth 2 (two) times daily.  20 tablet  0  . aspirin 81 MG tablet Take 81 mg by mouth daily.        . calcium citrate-vitamin D (CITRACAL+D) 315-200 MG-UNIT per tablet Take 1 tablet by mouth 2 (two) times daily.       . Cholecalciferol (VITAMIN D PO) Take by mouth. Vitamin D3 1000 mg once a day by mouth      . clonazePAM (KLONOPIN) 0.5 MG tablet Take 0.5 mg by mouth at bedtime as needed.       . fexofenadine (ALLEGRA) 180 MG tablet HOLD for 10days then can resume      . fluticasone (FLONASE) 50 MCG/ACT nasal spray Place 2 sprays into the nose daily.  16 g  6  . hydrochlorothiazide (HYDRODIURIL) 25 MG tablet Take 25 mg by mouth. 1/2 daily      . levothyroxine (SYNTHROID, LEVOTHROID) 125 MCG tablet Take 125 mcg by mouth daily.        . mometasone (ASMANEX 60 METERED DOSES) 220 MCG/INH inhaler Inhale 2 puffs into the lungs daily.  1 Inhaler  6  . predniSONE (DELTASONE) 10 MG tablet 4 x 3, 3 x 3, 2 x 3, 1 x 3   30 tablet  0  . Probiotic Product (PROBIOTIC PO) Take 1 capsule by mouth daily.       . simvastatin (ZOCOR) 20 MG tablet Take 1 tablet (20 mg total) by mouth every evening.  90 tablet  3  . telmisartan (MICARDIS) 40 MG tablet Take 1 tablet (40 mg total) by mouth daily.  90 tablet  4  . traMADol (ULTRAM) 50 MG tablet Take 50 mg by mouth every 6 (six) hours as needed.      . [DISCONTINUED] azithromycin (ZITHROMAX) 250 MG tablet Take 1 tablet (250 mg total) by mouth daily. Take two once then one daily until gone  6 each  0

## 2012-10-03 NOTE — Assessment & Plan Note (Signed)
Flare with bronchitis   Plan Begin Augmentin Twice daily  As directed-this prescription is at your pharmacy .  Saline nasal rinses As needed   Finish Prednisone taper over next week.  Fluids and rest  follow up Dr. Delford Field  In 2 months  Please contact office for sooner follow up if symptoms do not improve or worsen or seek emergency care

## 2012-10-09 ENCOUNTER — Encounter: Payer: Medicare Other | Attending: Nephrology | Admitting: *Deleted

## 2012-10-09 ENCOUNTER — Encounter: Payer: Self-pay | Admitting: *Deleted

## 2012-10-09 DIAGNOSIS — Z724 Inappropriate diet and eating habits: Secondary | ICD-10-CM | POA: Insufficient documentation

## 2012-10-09 DIAGNOSIS — Z713 Dietary counseling and surveillance: Secondary | ICD-10-CM | POA: Diagnosis not present

## 2012-10-09 NOTE — Patient Instructions (Addendum)
Goals:  Add lean protein to all carbs  Switch salt substitute to Mrs. Dash for less potassium  1400 calories 175 g carbohydrates 60 g protein 40 g fat  *NOTE: Will contact you once I have reached both MD offices for their restrictions/recommendations.

## 2012-10-09 NOTE — Progress Notes (Signed)
  Medical Nutrition Therapy:  Appt start time: 1145   End time: 1245  Primary concerns today:  [name] returns today for f/u with continued concern over her diet. She has recently been diagnosed with bone cancer and cannot have any sugar. Still needs food combinations low in sodium and CHO as well. Confused over what to eat (ex: white vs. wheat bread) d/t conflicting orders from her nephrologist (Deterding) and oncologist Marissa Calamity) at [facility]. Requests help with appropriate foods as she is scared to eat anything for fear of further damaging her kidneys or causing the cancer to progress. Has unintentionally lost ~10 lbs (reports she weighs 125 lbs at home) in last 2 months. Plan to contact RD at Dr. Marissa Calamity' office and Dr. Darrick Penna to ascertain her nutrient restrictions/recommendations. Will contact pt once completed.     MEDICATIONS: No changes reported.   Dietary intake:  Consumes apples, pears, kale, spinach, fresh green beans, rice made with coconut oil, salad, and fish.  Told by MD at [facility] that she cannot have ANY sugar/artificial sweeteners (except small amt of stevia) or bone cancer will grow.  Drinks water, decaf hot tea w/ stevia, skim milk.  Recent physical activity:  No exercise d/t severe asthma and recent sinus infection.  Estimated energy needs:  (SUBJECT TO CHANGE) 1400 calories 175 g carbohydrates (0g as sugar per MD) 60 g protein (1.1 g/kg) 40-50 g fat  Progress Towards Goal(s):  In progress.   Nutritional Diagnosis:  NB-1.1 Food and nutrition-related knowledge deficit As related to sodium and carbohydrate content of various foods.  As evidenced by multiple questions regarding food sources and their sodium conten, seeking confirmation regarding current food intake..    Intervention: Nutrition Education, Meal Planning.  Monitoring/Evaluation:  Dietary intake, exercise, and body weight TBD.

## 2012-10-11 ENCOUNTER — Encounter: Payer: Self-pay | Admitting: Pulmonary Disease

## 2012-10-11 ENCOUNTER — Ambulatory Visit (INDEPENDENT_AMBULATORY_CARE_PROVIDER_SITE_OTHER): Payer: Medicare Other | Admitting: Pulmonary Disease

## 2012-10-11 ENCOUNTER — Telehealth: Payer: Self-pay | Admitting: Critical Care Medicine

## 2012-10-11 VITALS — BP 98/62 | HR 82 | Temp 97.7°F | Ht 61.0 in | Wt 128.4 lb

## 2012-10-11 DIAGNOSIS — J45909 Unspecified asthma, uncomplicated: Secondary | ICD-10-CM | POA: Diagnosis not present

## 2012-10-11 DIAGNOSIS — R0789 Other chest pain: Secondary | ICD-10-CM

## 2012-10-11 NOTE — Telephone Encounter (Signed)
I spoke with pt. She is scheduled to see KC at 10:45 since PW is in lat and had no openings. She had no problem with this. Nothing further was needed

## 2012-10-11 NOTE — Assessment & Plan Note (Signed)
The patient has continued on her Asmanex, and is not requiring increased albuterol use.  She has been staying on her nasal spray and sinus rinses, but is now having issues with postnasal drip since being off Allegra.  I wonder if she would benefit from chlopheniramine at bedtime only.  We'll get her to try this and continue on her nasal hygiene regimen.  Her lungs are totally clear today with no wheezing.

## 2012-10-11 NOTE — Patient Instructions (Addendum)
Continue with your asmanex Would get chlorpheniramine 4mg  over the counter, and take 2 at bedtime to see if it helps your postnasal drip Can take advil or tylenol for your chest soreness. You are not "contagious" at this time, and ok to travel if ok. Keep followup with Dr. Delford Field.

## 2012-10-11 NOTE — Assessment & Plan Note (Signed)
The patient is describing classic costochondritis symptoms involving her sternal and parasternal area.  I suspect this is from coughing when she was sick back in December.

## 2012-10-11 NOTE — Progress Notes (Signed)
  Subjective:    Patient ID: [name], female    DOB: [birth year], 72 y.o.   MRN: [id number]  HPI The patient comes in today for an acute sick visit.  She is felt to have asthma for which she is on Asmanex, and also has some issues with sinusitis and rhinitis.  She is getting over an episode of sinusitis in December, where she was treated with antibiotics and prednisone.  She did improve, but does not feel that her nasal symptoms totally resolved.  She has no sinus pressure or congestion, but is noting persistent postnasal drip.  She has some sore throat.  She is not having any increased shortness of breath, nor has she had to use albuterol frequently.  She does note some chest discomfort in the midline and also parasternal area.   Review of Systems  Constitutional: Negative for fever and unexpected weight change.  HENT: Positive for postnasal drip. Negative for ear pain, nosebleeds, congestion, sore throat, rhinorrhea, sneezing, trouble swallowing, dental problem and sinus pressure.   Eyes: Negative for redness and itching.  Respiratory: Negative for cough, chest tightness, shortness of breath and wheezing.   Cardiovascular: Negative for palpitations and leg swelling.  Gastrointestinal: Negative for nausea and vomiting.  Genitourinary: Negative for dysuria.  Musculoskeletal: Negative for joint swelling.  Skin: Negative for rash.  Neurological: Negative for headaches.  Hematological: Does not bruise/bleed easily.  Psychiatric/Behavioral: Negative for dysphoric mood. The patient is not nervous/anxious.        Objective:   Physical Exam Frail-appearing female in no acute distress Nose without purulence or discharge noted, but there is some mild mucosal edema and erythema Oropharynx clear Neck without lymphadenopathy or thyromegaly Chest totally clear to auscultation, no wheezing Cardiac exam with regular rate and rhythm Lower extremities without edema, no cyanosis Alert and oriented,  moves all 4 extremities.       Assessment & Plan:

## 2012-10-15 ENCOUNTER — Other Ambulatory Visit: Payer: Self-pay | Admitting: Oncology

## 2012-10-15 DIAGNOSIS — E8809 Other disorders of plasma-protein metabolism, not elsewhere classified: Secondary | ICD-10-CM

## 2012-10-15 NOTE — Progress Notes (Signed)
ID: [name]   DOB: 27-Jan-1941  MR#: [id number]  CSN#:[id number]  HISTORY OF PRESENT ILLNESS: The patient was evaluated by Dr. Darrick Penna for a history of hematuria, previously evaluated by Bjorn Pippin.  There is a history of multiple urinary tract infections and interstitial cystitis, but no history of stones or family history of hematuria.  Dr. Darrick Penna suspects this could be interstitial or papillary related to nonsteroidals and aspirin, but he is concerned that this could be related to one of the patient's cancers.  As part of his workup, he obtained serum protein electrophoresis, which showed an M-spike of 800 mg.  A total immunoglobulin G was 1374, total A was 46, and total M was 39.  Immunofixation showed IgG lambda specificity for the monoclonal component.  With this information, the patient was referred for further evaluation and treatment. 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   REVIEW OF SYSTEMS: She tells me she has been very dizzy, and feels that the pressure in her right I may be increasing. She was recently at [facility] and had a cancer removed from her nose. I do not have those records. She's had a persistent flu, moderate fatigue, insomnia, runny nose, sinus problems, hoarseness, irregular heartbeat, shortness of breath when walking up stairs, history of urinary infections, some arthritis symptoms, forgetfulness, anxiety, but no depression symptoms. She has had no fever, rash, or bleeding problems.  PAST MEDICAL HISTORY: Past Medical History  Diagnosis Date  . 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 History of blood clots     LEG  . Stroke   . Hernia   . MGUS (monoclonal gammopathy of unknown significance)   . Chronic kidney disease   . Hypertension   . Hyperlipidemia   . Thyroid disease   . Arthritis   . Mitral valve prolapse   . Anxiety   . Perforated bowel   . Peritonitis   . Diverticulosis     Pt  reported on 03/15/12  . MGUS (monoclonal gammopathy of unknown significance)   . Cancer     THYROID, SKIN, NOSE, BONE  . Melanoma   1. History of right ocular melanoma, status post enucleation and brachytherapy (I do not have the actual records from the treatments, which were performed in [place], I believe in 2005; 2. History of papillary carcinoma of the thyroid, resected at [facility] approximately 3 years ago and followed by radiation to that area (most likely I-131 but again, I do not have those records).  Multiple other medical problems include hypertension, history of left body stroke with mild residuals, history of superficial thrombophlebitis, history of mitral valve prolapse, history of hyperlipidemia, history of GERD, history of ventral hernia repair x 2, history of hysterectomy with bilateral salpingo-oophorectomy for fibroids at age 37, history of fibrocystic breast changes, history of reactive airway disease, history of removal of a left axillary mass November 09, 2006, which was benign, history of allergic rhinitis, history of depression, history of osteoporosis, history of remote deep vein thrombosis.  PAST SURGICAL HISTORY: Past Surgical History  Procedure Date  . Abdominal hysterectomy   . Rotator cuff repair 2004    RIGHT SHOULDER  . Hernia repair   . Leg surgery     VEIN & BLOOD CLOT  . Cardiovascular stress test 2006    NORMAL  . Transthoracic echocardiogram 2006    FAMILY HISTORY Family History  Problem Relation Age  of Onset  . Stroke Mother   . Asthma Father   . Diabetes Father   . Cancer Father   . Cancer Sister   . Diabetes Sister   . Cancer Brother   The patient's father had a history of prostate cancer and kidney cancer.  He died at age 22.  The patient's mother had a history of stroke and died at age 64.  The patient had a brother who committed suicide, a sister who had cervical cancer, but did fine after hysterectomy and a sister who had "a very unusual  cancer" involving the jaw and neck, now recurrent.  She is treated, I believe, out of [place].    GYNECOLOGIC HISTORY: She is GX P2.  Did not take hormone replacement after her hysterectomy.    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 She is now retired, divorced, and lives by herself. Her son, [name], lives in [place] and works as a Animator.  Her son, [name], is a "tree man", again locally.  The patient has 3 grandchildren, one in [place] and two here in town. She attends [employer]     ADVANCED DIRECTIVES: in place  HEALTH MAINTENANCE: History  Substance Use Topics  . Smoking status: Never Smoker   . Smokeless tobacco: Never Used  . Alcohol Use: No     Colonoscopy:  PAP:  Bone density:  Lipid panel:  Allergies  Allergen Reactions  . Aldactone (Spironolactone)     Burning in stomach  . Hydrocodone   . Levofloxacin   . Quinolones   . Sertraline Hcl   . Sudafed (Pseudoephedrine Hcl)   . Ultram (Tramadol)     Headaches     Current Outpatient Prescriptions  Medication Sig Dispense Refill  . acetaminophen (TYLENOL) 650 MG CR tablet Per bottle      . albuterol (VENTOLIN HFA) 108 (90 BASE) MCG/ACT inhaler Inhale 2 puffs into the lungs every 6 (six) hours as needed. For shortness of breath      . amLODipine (NORVASC) 5 MG tablet Take 1 tablet (5 mg total) by mouth daily.  90 tablet  3  . aspirin 81 MG tablet Take 81 mg by mouth daily.        . calcium citrate-vitamin D (CITRACAL+D) 315-200 MG-UNIT per tablet Take 1 tablet by mouth 2 (two) times daily.       . Cholecalciferol (VITAMIN D PO) Take by mouth. Vitamin D3 1000 mg once a day by mouth      . clonazePAM (KLONOPIN) 0.5 MG tablet Take 0.5 mg by mouth at bedtime as needed.       . fexofenadine (ALLEGRA) 180 MG tablet 90 mg. HOLD for 10days then can resume      .  fluticasone (FLONASE) 50 MCG/ACT nasal spray Place 2 sprays into the nose daily.  16 g  6  . hydrochlorothiazide (HYDRODIURIL) 25 MG tablet Take 25 mg by mouth. 1/2 daily      . levothyroxine (SYNTHROID, LEVOTHROID) 125 MCG tablet Take 125 mcg by mouth daily.        . mometasone (ASMANEX 60 METERED DOSES) 220 MCG/INH inhaler Inhale 2 puffs into the lungs daily.  1 Inhaler  6  . simvastatin (ZOCOR) 20 MG tablet Take 1 tablet (20 mg total) by mouth every evening.  90 tablet  3  . telmisartan (MICARDIS)  40 MG tablet Take 1 tablet (40 mg total) by mouth daily.  90 tablet  4  . traMADol (ULTRAM) 50 MG tablet Take 50 mg by mouth every 6 (six) hours as needed.        OBJECTIVE: Middle-aged white woman who appears slightly disheveled There were no vitals filed for this visit.   There is no height or weight on file to calculate BMI.    ECOG FS: 2  Sclerae unicteric Oropharynx clear; erythema left external ear canal secondary to "cleaning trauma" No peripheral adenopathy Lungs no rales or rhonchi Heart regular rate and rhythm Abd benign MSK no focal spinal tenderness, no peripheral edema Neuro: nonfocal Breasts: deferred  LAB RESULTS: Results for [name], [name] (MRN [id number]) as of 03/07/2012 13:20  Ref. Range 12/03/2010 13:31 05/05/2011 09:09 05/05/2011 09:09 11/11/2011 10:28 02/29/2012 09:52  IgG (Immunoglobin G), Serum Latest Range: [id number] mg/dL 4098 1191 4782 9562 1308    Lab Results  Component Value Date   WBC 5.8 07/11/2012   NEUTROABS 3.2 07/11/2012   HGB 13.0 07/11/2012   HCT 38.7 07/11/2012   MCV 87.0 07/11/2012   PLT 267 07/11/2012      Chemistry      Component Value Date/Time   NA 139 07/11/2012 0931   NA 139 07/06/2012 0903   K 4.2 07/11/2012 0931   K 3.9 07/06/2012 0903   CL 104 07/11/2012 0931   CL 105 07/06/2012 0903   CO2 23 07/11/2012 0931   CO2 26 07/06/2012 0903   BUN 14.0 07/11/2012 0931   BUN 12 07/06/2012 0903   CREATININE 0.8 07/11/2012 0931   CREATININE 0.7 07/06/2012 0903       Component Value Date/Time   CALCIUM 9.9 07/11/2012 0931   CALCIUM 9.5 07/06/2012 0903   ALKPHOS 46 07/11/2012 0931   ALKPHOS 41 07/06/2012 0903   AST 16 07/11/2012 0931   AST 22 07/06/2012 0903   ALT 17 07/11/2012 0931   ALT 17 07/06/2012 0903   BILITOT 1.20 07/11/2012 0931   BILITOT 1.2 07/06/2012 0903       No results found for this basename: LABCA2    No components found with this basename: LABCA125    No results found for this basename: INR:1;PROTIME:1 in the last 168 hours  Urinalysis    Component Value Date/Time   COLORURINE YELLOW 07/09/2011 1429   APPEARANCEUR CLEAR 07/09/2011 1429   LABSPEC 1.015 02/12/2012 1919   PHURINE 8.0 02/12/2012 1919   GLUCOSEU NEGATIVE 02/12/2012 1919   HGBUR NEGATIVE 02/12/2012 1919   BILIRUBINUR NEGATIVE 02/12/2012 1919   KETONESUR 15* 02/12/2012 1919   PROTEINUR NEGATIVE 02/12/2012 1919   UROBILINOGEN 0.2 02/12/2012 1919   NITRITE NEGATIVE 02/12/2012 1919   LEUKOCYTESUR NEGATIVE 02/12/2012 1919    STUDIES: Mammography 12/20/2011 was unremarkable  ASSESSMENT: 72 year old [place] woman with a history of:  (1) Ocular melanoma status post right eye enucleation and brachytherapy (I do not have the details) with no evidence of recurrence;  (2) History of papillary thyroid cancer, status post partial thyroidectomy and radioiodine (?) treatments at [facility], again with no evidence of recurrence;   (3) Monoclonal gammopathy of uncertain significance, IgG lambda with a negative bone survey    (a) bone marrow biopsy at [facility] 08/24/2012 shows 20-22% plasma cells with hyper diploid cytogenetics, showing extra copies of 4, 11, 13, and 17, no cells with cyclin D1/IGH, FGFR 3/IGH or IGH/[name].-MA F gene fusions. No deletion up p53 or 13q was noted. Fish results were normal.  (  4) melanoma removed from the 4 head in 1974, left arm 2005, right chest and shoulder 2006, left mid back to 2010; followed by Lu Duffel tafeen  (5) Moh's surgery to the nose x2, August of  2011  PLAN: Rainie seems a bit more scattered of today than I remember her, and while her complaints of dizziness are entirely consistent with either mild vertigo or sinus problems, given her past medical history I think a brain MRI would be appropriate. She does not want to obtain this test because she tells me she is scheduled for review of her ocular melanoma in August and she will need to have a brain MRI before that visit. We left it that she is going to call him and see she can either move that appointment up, we'll get their clearance for using this MRI as part of her followup.  As far as her M-GUS is concerned there has been no significant change. The slight increase in her total IgG is likely due to a recent viral infection. We're going to recheck labs in October, and then of labs and visit in April of 2014. She knows to call for any problems that may develop before that visit.   [name]    10/15/2012

## 2012-10-23 ENCOUNTER — Telehealth: Payer: Self-pay | Admitting: Oncology

## 2012-10-23 NOTE — Telephone Encounter (Signed)
Tiffany from [facility] called today to see if 4/1 lb should be r/s due to pt has pet @ 7am. Tiffany informed that as long as appts do not conflict and pt will be done w/pet by 9am lb can stay.  Added comment to 2/3 appt to send pt for April schedule.

## 2012-11-06 ENCOUNTER — Other Ambulatory Visit: Payer: Self-pay | Admitting: *Deleted

## 2012-11-06 ENCOUNTER — Other Ambulatory Visit (HOSPITAL_BASED_OUTPATIENT_CLINIC_OR_DEPARTMENT_OTHER): Payer: Medicare Other | Admitting: Lab

## 2012-11-06 DIAGNOSIS — D472 Monoclonal gammopathy: Secondary | ICD-10-CM

## 2012-11-06 LAB — CBC WITH DIFFERENTIAL/PLATELET
Basophils Absolute: 0 10*3/uL (ref 0.0–0.1)
EOS%: 2.5 % (ref 0.0–7.0)
Eosinophils Absolute: 0.1 10*3/uL (ref 0.0–0.5)
HGB: 12.7 g/dL (ref 11.6–15.9)
LYMPH%: 31.1 % (ref 14.0–49.7)
MCH: 29.3 pg (ref 25.1–34.0)
MCV: 84.3 fL (ref 79.5–101.0)
MONO%: 8.3 % (ref 0.0–14.0)
NEUT#: 2.9 10*3/uL (ref 1.5–6.5)
Platelets: 274 10*3/uL (ref 145–400)
RDW: 12.8 % (ref 11.2–14.5)

## 2012-11-06 LAB — COMPREHENSIVE METABOLIC PANEL (CC13)
ALT: 12 U/L (ref 0–55)
AST: 15 U/L (ref 5–34)
CO2: 22 mEq/L (ref 22–29)
Calcium: 9.7 mg/dL (ref 8.4–10.4)
Chloride: 104 mEq/L (ref 98–107)
Creatinine: 0.7 mg/dL (ref 0.6–1.1)
Potassium: 4.1 mEq/L (ref 3.5–5.1)
Sodium: 138 mEq/L (ref 136–145)
Total Protein: 7.6 g/dL (ref 6.4–8.3)

## 2012-11-07 ENCOUNTER — Encounter: Payer: Self-pay | Admitting: Cardiology

## 2012-11-07 ENCOUNTER — Telehealth: Payer: Self-pay | Admitting: Cardiology

## 2012-11-07 ENCOUNTER — Other Ambulatory Visit (INDEPENDENT_AMBULATORY_CARE_PROVIDER_SITE_OTHER): Payer: Medicare Other

## 2012-11-07 ENCOUNTER — Ambulatory Visit (INDEPENDENT_AMBULATORY_CARE_PROVIDER_SITE_OTHER): Payer: Medicare Other | Admitting: Cardiology

## 2012-11-07 ENCOUNTER — Telehealth: Payer: Self-pay | Admitting: *Deleted

## 2012-11-07 VITALS — BP 130/68 | HR 74 | Resp 18 | Ht 61.0 in | Wt 123.8 lb

## 2012-11-07 DIAGNOSIS — E7800 Pure hypercholesterolemia, unspecified: Secondary | ICD-10-CM

## 2012-11-07 DIAGNOSIS — D472 Monoclonal gammopathy: Secondary | ICD-10-CM | POA: Diagnosis not present

## 2012-11-07 DIAGNOSIS — I119 Hypertensive heart disease without heart failure: Secondary | ICD-10-CM

## 2012-11-07 LAB — BASIC METABOLIC PANEL
Calcium: 9.8 mg/dL (ref 8.4–10.5)
Creatinine, Ser: 0.7 mg/dL (ref 0.4–1.2)
GFR: 84.29 mL/min (ref >60.00)
Sodium: 139 mEq/L (ref 135–145)

## 2012-11-07 LAB — HEPATIC FUNCTION PANEL
ALT: 16 U/L (ref 0–35)
AST: 20 U/L (ref 0–37)
Albumin: 4 g/dL (ref 3.5–5.2)
Alkaline Phosphatase: 41 U/L (ref 39–117)

## 2012-11-07 LAB — LIPID PANEL
Cholesterol: 136 mg/dL (ref 0–200)
HDL: 49.2 mg/dL (ref >39.00)
Triglycerides: 54 mg/dL (ref 0.0–149.0)

## 2012-11-07 MED ORDER — AMLODIPINE BESYLATE 5 MG PO TABS: 5.0000 mg | ORAL_TABLET | Freq: Every day | ORAL | Status: DC

## 2012-11-07 MED ORDER — HYDROCHLOROTHIAZIDE 25 MG PO TABS: ORAL_TABLET | ORAL | Status: DC

## 2012-11-07 MED ORDER — SIMVASTATIN 20 MG PO TABS: 20.0000 mg | ORAL_TABLET | Freq: Every evening | ORAL | Status: DC

## 2012-11-07 MED ORDER — TELMISARTAN 40 MG PO TABS: 40.0000 mg | ORAL_TABLET | Freq: Every day | ORAL | Status: DC

## 2012-11-07 NOTE — Progress Notes (Signed)
Quick Note:  Please report to patient. The recent labs are stable. Continue same medication and careful diet. The lipids are better since she has lost the weight and she is on her careful diet ______

## 2012-11-07 NOTE — Assessment & Plan Note (Signed)
The patient has not been experiencing any symptoms of congestive heart failure.  Her energy level has been reasonably good.  She is doing aerobic exercise at home and she is on a very careful heart healthy diet and is avoiding carbohydrates and she has lost 9 pounds since last visit intentionally.

## 2012-11-07 NOTE — Patient Instructions (Addendum)
Your physician recommends that you continue on your current medications as directed. Please refer to the Current Medication list given to you today.  Your physician recommends that you schedule a follow-up appointment in: 4 months with fasting labs (lp/bmet/hfp)  

## 2012-11-07 NOTE — Progress Notes (Signed)
[name] Date of Birth:  1941/03/11 [facility] [address] [place], [place]  [address] [phone]         Fax   [phone]  History of Present Illness: This pleasant 72 year old woman is seen for followup office visit. She has a past history of essential hypertension. She also has a past history of hypercholesterolemia and a past history of melanoma as well as thyroid cancer. She has had previous atypical chest pain. She had a normal nuclear stress test in 2006. Since last visit she has had several new problems.  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  She is now being followed for this by Dr. Marissa Calamity at [facility].  Her last PET scan did not show any bony lesions.  Over the course of the winter she has had a lot of pulmonary and respiratory issues.   Current Outpatient Prescriptions  Medication Sig Dispense Refill  . acetaminophen (TYLENOL) 650 MG CR tablet Per bottle      . albuterol (VENTOLIN HFA) 108 (90 BASE) MCG/ACT inhaler Inhale 2 puffs into the lungs every 6 (six) hours as needed. For shortness of breath      . amLODipine (NORVASC) 5 MG tablet Take 1 tablet (5 mg total) by mouth daily.  90 tablet  3  . aspirin 81 MG tablet Take 81 mg by mouth daily.        . calcium citrate-vitamin D (CITRACAL+D) 315-200 MG-UNIT per tablet Take 1 tablet by mouth 2 (two) times daily.       . Cholecalciferol (VITAMIN D PO) Take by mouth. Vitamin D3 1000 mg once a day by mouth      . clonazePAM (KLONOPIN) 0.5 MG tablet Take 0.5 mg by mouth at bedtime as needed.       . fexofenadine (ALLEGRA) 180 MG tablet 90 mg. HOLD for 10days then can resume      . fluticasone (FLONASE) 50 MCG/ACT nasal spray Place 2 sprays into the nose daily.  16 g  6  . hydrochlorothiazide (HYDRODIURIL) 25 MG tablet 1/2 daily or as directed  90 tablet  3  . levothyroxine (SYNTHROID, LEVOTHROID) 125 MCG tablet Take 125 mcg by mouth daily.        . mometasone (ASMANEX 60 METERED  DOSES) 220 MCG/INH inhaler Inhale 2 puffs into the lungs daily.  1 Inhaler  6  . simvastatin (ZOCOR) 20 MG tablet Take 1 tablet (20 mg total) by mouth every evening.  90 tablet  3  . telmisartan (MICARDIS) 40 MG tablet Take 1 tablet (40 mg total) by mouth daily.  90 tablet  3  . traMADol (ULTRAM) 50 MG tablet Take 50 mg by mouth every 6 (six) hours as needed.        Allergies  Allergen Reactions  . Aldactone (Spironolactone)     Burning in stomach  . Hydrocodone   . Levofloxacin   . Quinolones   . Sertraline Hcl   . Sudafed (Pseudoephedrine Hcl)   . Ultram (Tramadol)     Headaches     Patient Active Problem List  Diagnosis  . Benign hypertensive heart disease without heart failure  . Hypercholesterolemia  . Hypothyroidism  . History of melanoma  . Hx of thyroid cancer  . Chest pain, atypical  . Moderate persistent asthma with primary cough variation  . MGUS (monoclonal gammopathy of unknown significance)    History  Smoking status  . Never Smoker  Smokeless tobacco  . Never Used    History  Alcohol Use No    Family History  Problem Relation Age of Onset  . Stroke Mother   . Asthma Father   . Diabetes Father   . Cancer Father   . Cancer Sister   . Diabetes Sister   . Cancer Brother     Review of Systems: Constitutional: no fever chills diaphoresis or fatigue or change in weight.  Head and neck: no hearing loss, no epistaxis, no photophobia or visual disturbance. Respiratory: No cough, shortness of breath or wheezing. Cardiovascular: No chest pain peripheral edema, palpitations. Gastrointestinal: No abdominal distention, no abdominal pain, no change in bowel habits hematochezia or melena. Genitourinary: No dysuria, no frequency, no urgency, no nocturia. Musculoskeletal:No arthralgias, no back pain, no gait disturbance or myalgias. Neurological: No dizziness, no headaches, no numbness, no seizures, no syncope, no weakness, no tremors. Hematologic: No  lymphadenopathy, no easy bruising. Psychiatric: No confusion, no hallucinations, no sleep disturbance.    Physical Exam: The general appearance reveals a well-developed well-nourished woman in no distress.The head and neck exam reveals pupils equal and reactive.  Extraocular movements are full.  There is no scleral icterus.  The mouth and pharynx are normal.  The neck is supple.  The carotids reveal no bruits.  The jugular venous pressure is normal.  The  thyroid is not enlarged.  There is no lymphadenopathy.  The chest is clear to percussion and auscultation.  There are no rales or rhonchi.  Expansion of the chest is symmetrical.  The precordium is quiet.  The first heart sound is normal.  The second heart sound is physiologically split.  There is no murmur gallop rub or click.  There is no abnormal lift or heave.  The abdomen is soft and nontender.  The bowel sounds are normal.  The liver and spleen are not enlarged.  There are no abdominal masses.  There are no abdominal bruits.  Extremities reveal good pedal pulses.  There is no phlebitis or edema.  There is no cyanosis or clubbing.  Strength is normal and symmetrical in all extremities.  There is no lateralizing weakness.  There are no sensory deficits.  The skin is warm and dry.  There is no rash.  EKG shows a sinus rhythm and no ischemic changes.  Pulse is 60 and regular in sinus rhythm   Assessment / Plan:  Continue same medication.  Recheck in 4 months for followup office visit lipid panel hepatic function panel and basal metabolic panel.  Blood work today is pending.

## 2012-11-07 NOTE — Telephone Encounter (Signed)
Message copied by Burnell Blanks on Tue Nov 07, 2012  2:09 PM ------      Message from: Cassell Clement      Created: Tue Nov 07, 2012 12:35 PM       Please report to patient.  The recent labs are stable. Continue same medication and careful diet.  The lipids are better since she has lost the weight and she is on her careful diet

## 2012-11-07 NOTE — Assessment & Plan Note (Signed)
The patient has a history of hypercholesterolemia.  We are checking lab work today.  She is tolerating simvastatin 20 mg daily without side effects

## 2012-11-07 NOTE — Telephone Encounter (Signed)
rtn call re blood work

## 2012-11-07 NOTE — Assessment & Plan Note (Signed)
She is concerned that the monoclonal gammopathy of unknown significance will go to her balance but she is hopeful and please that so far there is no evidence of bony involvement.

## 2012-11-07 NOTE — Telephone Encounter (Signed)
Mailed copy of labs and left message to call if any questions  

## 2012-11-07 NOTE — Telephone Encounter (Signed)
Spoke with patient and gave her lab results.

## 2012-11-08 LAB — KAPPA/LAMBDA LIGHT CHAINS
Kappa:Lambda Ratio: 0 — ABNORMAL LOW (ref 0.26–1.65)
Lambda Free Lght Chn: 33.2 mg/dL — ABNORMAL HIGH (ref 0.57–2.63)

## 2012-11-08 LAB — SPEP & IFE WITH QIG
Albumin ELP: 56.2 % (ref 55.8–66.1)
Alpha-1-Globulin: 4.1 % (ref 2.9–4.9)
Alpha-2-Globulin: 8.6 % (ref 7.1–11.8)
Beta 2: 23.6 % — ABNORMAL HIGH (ref 3.2–6.5)
Beta Globulin: 5.4 % (ref 4.7–7.2)
IgA: 15 mg/dL — ABNORMAL LOW (ref 69–380)
Total Protein, Serum Electrophoresis: 7.1 g/dL (ref 6.0–8.3)

## 2012-11-13 ENCOUNTER — Telehealth: Payer: Self-pay | Admitting: Cardiology

## 2012-11-13 DIAGNOSIS — I119 Hypertensive heart disease without heart failure: Secondary | ICD-10-CM

## 2012-11-13 MED ORDER — TELMISARTAN 40 MG PO TABS: 40.0000 mg | ORAL_TABLET | Freq: Every day | ORAL | Status: DC

## 2012-11-13 NOTE — Telephone Encounter (Signed)
Pt's mycardis written rx has not gotten to her yet, [name] was to mail them to her,  she is out of town so now needs it called into [facility] asap she only has 8 pills left 90 days with refills

## 2012-11-17 ENCOUNTER — Ambulatory Visit: Payer: Medicare Other | Admitting: *Deleted

## 2012-11-17 ENCOUNTER — Telehealth: Payer: Self-pay | Admitting: *Deleted

## 2012-11-17 ENCOUNTER — Other Ambulatory Visit: Payer: Self-pay | Admitting: Oncology

## 2012-11-17 NOTE — Telephone Encounter (Signed)
This RN returned call to pt per message requesting results.  Discussed with pt lab values per MD review showing slow increase which warrants continued close surveillance. Also discussed with pt concerns relating to immune system and avoiding large crowds or sick family members.  Pt is scheduled for next lab in 3 months.  [name] did request for PET scan scheduled in April to be cancelled - she will obtain at [facility] due to insurance request.  Pt verbalized appreciation and understanding of above.

## 2012-11-20 ENCOUNTER — Ambulatory Visit: Payer: Medicare Other | Admitting: *Deleted

## 2012-11-20 ENCOUNTER — Telehealth: Payer: Self-pay | Admitting: Critical Care Medicine

## 2012-11-20 NOTE — Telephone Encounter (Signed)
Pt very anxious and not feeling well. She has been having a lot of nasal congestion that is now making her chest feel raw. She is using her Asmanex as directed and doing daily sinus rinses along with Flonase. She does not have a fever and has no cough. SOB was better once pt calmed down and try some deep breathing exercises. Pr lives alone and becomes anxious quickly. Pt will keep OV with PW tomorrow afternoon but will check back in the morning to see if he has had any cancellations so she can come in earlier. Pt will seek emergency help if needed.  Pt's breathing had returned to her normal and she denied any chest pains at the end of the conversation.

## 2012-11-21 ENCOUNTER — Ambulatory Visit: Payer: Medicare Other | Admitting: Critical Care Medicine

## 2012-11-21 ENCOUNTER — Ambulatory Visit (INDEPENDENT_AMBULATORY_CARE_PROVIDER_SITE_OTHER): Payer: Medicare Other | Admitting: Critical Care Medicine

## 2012-11-21 ENCOUNTER — Encounter: Payer: Self-pay | Admitting: Critical Care Medicine

## 2012-11-21 VITALS — BP 100/60 | HR 68 | Temp 97.6°F | Ht 61.0 in | Wt 126.6 lb

## 2012-11-21 DIAGNOSIS — J45909 Unspecified asthma, uncomplicated: Secondary | ICD-10-CM

## 2012-11-21 MED ORDER — FLUTICASONE PROPIONATE 50 MCG/ACT NA SUSP: 2.0000 | Freq: Every day | NASAL | Status: DC

## 2012-11-21 MED ORDER — AMOXICILLIN-POT CLAVULANATE 875-125 MG PO TABS: 1.0000 | ORAL_TABLET | Freq: Two times a day (BID) | ORAL | Status: DC

## 2012-11-21 NOTE — Progress Notes (Signed)
Subjective:    Patient ID: [name], female    DOB: October 29, 1940, 72 y.o.   MRN: [id number]  HPI   72 y.o. WF  never smoker with  H/o asthma/ allergies to dust mold  and referred by Dr Clarene Duke 10/21/2011 for pulmonary evaluation for difficult to control asthma      09/25/12 follow up  Complains of 1 week follow up - reports some better, still hoarse, some HA w/ the prednisone, tightness in chest.  currently taking 30mg  prednisone daily, finished the zpak.  did not pick up the augmentin. Seen 1 week ago with bronchitis , given zpack without much help  Called in augmentin  has not picked up yet.  No hemoptyiss or chest pain  No edema.   11/21/2012 Since last ov 12/17 with PW has been seen twice : 12/23 and 10/11/12 for acute sinusitis and bronchitis flares PT now ill again and here for acute OV Pt did well until past week[place]. Pt then developed fever,  Throat pain, ?Flu, pt did have flu vaccine. Now cough is productive of dark red mucus (one time), other times thick color mucus. Notes more dyspnea. Pt is still hoarse. Not febrile, but has body aches and chills.  PUL ASTHMA HISTORY 09/19/2012  Symptoms Daily  Nighttime awakenings 0-2/month  Interference with activity Some limitations  SABA use > 2 days/wk--not > 1 x/day  Exacerbations requiring oral steroids 0-1 / year    Past Medical History  Diagnosis Date  . 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 History of blood clots     LEG  . Stroke   . Hernia   . MGUS (monoclonal gammopathy of unknown significance)   . Chronic kidney disease   . Hypertension   . Hyperlipidemia   . Thyroid disease   . Arthritis   . Mitral valve prolapse   . Anxiety   . Perforated bowel   . Peritonitis   . Diverticulosis     Pt reported on 03/15/12  . MGUS (monoclonal gammopathy of unknown significance)   . Cancer     THYROID, SKIN, NOSE, BONE  . Melanoma      Family History  Problem Relation Age  of Onset  . Stroke Mother   . Asthma Father   . Diabetes Father   . Cancer Father   . Cancer Sister   . Diabetes Sister   . Cancer Brother      History   Social History  . Marital Status: Divorced    Spouse Name: N/A    Number of Children: 2  . Years of Education: N/A   Occupational History  . Retired     Holiday representative   Social History Main Topics  . Smoking status: Never Smoker   . Smokeless tobacco: Never Used  . Alcohol Use: No  . Drug Use: No  . Sexually Active: Not on file   Other Topics Concern  . Not on file   Social History Narrative  . No narrative on file     Allergies  Allergen Reactions  . Aldactone (Spironolactone)     Burning in stomach  . Hydrocodone   . Levofloxacin   . Quinolones   . Sertraline Hcl   . Sudafed (Pseudoephedrine Hcl)   . Ultram (Tramadol)     Headaches      Outpatient Prescriptions Prior to Visit  Medication Sig Dispense Refill  . acetaminophen (TYLENOL)  650 MG CR tablet 500 mg. Per bottle      . albuterol (VENTOLIN HFA) 108 (90 BASE) MCG/ACT inhaler Inhale 2 puffs into the lungs every 6 (six) hours as needed. For shortness of breath      . amLODipine (NORVASC) 5 MG tablet Take 1 tablet (5 mg total) by mouth daily.  90 tablet  3  . aspirin 81 MG tablet Take 81 mg by mouth daily.        . calcium citrate-vitamin D (CITRACAL+D) 315-200 MG-UNIT per tablet Take 1 tablet by mouth 2 (two) times daily.       . Cholecalciferol (VITAMIN D PO) Take by mouth. Vitamin D3 1000 mg once a day by mouth      . clonazePAM (KLONOPIN) 0.5 MG tablet Take 0.5 mg by mouth at bedtime as needed.       . hydrochlorothiazide (HYDRODIURIL) 25 MG tablet 1/2 daily or as directed  90 tablet  3  . levothyroxine (SYNTHROID, LEVOTHROID) 125 MCG tablet Take 125 mcg by mouth daily.        . mometasone (ASMANEX 60 METERED DOSES) 220 MCG/INH inhaler Inhale 2 puffs into the lungs daily.  1 Inhaler  6  . simvastatin (ZOCOR) 20 MG tablet Take 1 tablet (20 mg total)  by mouth every evening.  90 tablet  3  . telmisartan (MICARDIS) 40 MG tablet Take 1 tablet (40 mg total) by mouth daily.  90 tablet  3  . traMADol (ULTRAM) 50 MG tablet Take 50 mg by mouth every 6 (six) hours as needed.      . fexofenadine (ALLEGRA) 180 MG tablet 90 mg. HOLD for 10days then can resume      . fluticasone (FLONASE) 50 MCG/ACT nasal spray Place 2 sprays into the nose daily.  16 g  6   No facility-administered medications prior to visit.       Review of Systems  Constitutional:   No  weight loss, night sweats,  Fevers, chills, fatigue, lassitude. HEENT:   No headaches,  Difficulty swallowing,  Tooth/dental problems,  +++Sore throat,                No sneezing, itching, ear ache,+++ nasal congestion, +++post nasal drip,   CV:  No chest pain,  Orthopnea, PND, swelling in lower extremities, anasarca, dizziness, palpitations  GI  No heartburn, indigestion, abdominal pain, nausea, vomiting, diarrhea, change in bowel habits, loss of appetite  Resp: Notes shortness of breath with exertion not at rest.  No wheezing.  No chest wall deformity  Skin: no rash or lesions.  GU: no dysuria, change in color of urine, no urgency or frequency.  No flank pain.  MS:  No joint pain or swelling.  No decreased range of motion.  No back pain.  Psych:  No change in mood or affect. No depression or anxiety.  No memory loss.     Objective:   Physical Exam  Filed Vitals:   11/21/12 1437  BP: 100/60  Pulse: 68  Temp: 97.6 F (36.4 C)  TempSrc: Oral  Height: 5\' 1"  (1.549 m)  Weight: 126 lb 9.6 oz (57.425 kg)  SpO2: 97%    Gen: Pleasant, well-nourished, in no distress,  normal affect  ENT: No lesions,  mouth clear,  oropharynx clear, right greater than left nasal purulence  Neck: No JVD, no TMG, no carotid bruits  Lungs: No use of accessory muscles, no dullness to percussion, distant breath sounds  Cardiovascular: RRR, heart sounds normal,  no murmur or gallops, no peripheral  edema  Abdomen: soft and NT, no HSM,  BS normal  Musculoskeletal: No deformities, no cyanosis or clubbing  Neuro: alert, non focal  Skin: Warm, no lesions or rashes     Assessment & Plan:   Moderate persistent asthma with primary cough variation Moderate persistent asthma with associated sinusitis Plan Take augmentin 875mg  twice daily for 7days Refill flonase two puff each nostril daily Use sinus rinse daily Use chlorpheniramine as needed Stop allergra Mucinex DM as needed for cough Return 2 months     Updated Medication List Outpatient Encounter Prescriptions as of 11/21/2012  Medication Sig Dispense Refill  . acetaminophen (TYLENOL) 650 MG CR tablet 500 mg. Per bottle      . albuterol (VENTOLIN HFA) 108 (90 BASE) MCG/ACT inhaler Inhale 2 puffs into the lungs every 6 (six) hours as needed. For shortness of breath      . amLODipine (NORVASC) 5 MG tablet Take 1 tablet (5 mg total) by mouth daily.  90 tablet  3  . aspirin 81 MG tablet Take 81 mg by mouth daily.        . calcium citrate-vitamin D (CITRACAL+D) 315-200 MG-UNIT per tablet Take 1 tablet by mouth 2 (two) times daily.       . chlorpheniramine (CHLOR-TRIMETON) 4 MG tablet Take 4 mg by mouth 2 (two) times daily as needed for allergies.      . Cholecalciferol (VITAMIN D PO) Take by mouth. Vitamin D3 1000 mg once a day by mouth      . clonazePAM (KLONOPIN) 0.5 MG tablet Take 0.5 mg by mouth at bedtime as needed.       . fluticasone (FLONASE) 50 MCG/ACT nasal spray Place 2 sprays into the nose daily.  16 g  6  . hydrochlorothiazide (HYDRODIURIL) 25 MG tablet 1/2 daily or as directed  90 tablet  3  . levothyroxine (SYNTHROID, LEVOTHROID) 125 MCG tablet Take 125 mcg by mouth daily.        . mometasone (ASMANEX 60 METERED DOSES) 220 MCG/INH inhaler Inhale 2 puffs into the lungs daily.  1 Inhaler  6  . simvastatin (ZOCOR) 20 MG tablet Take 1 tablet (20 mg total) by mouth every evening.  90 tablet  3  . telmisartan (MICARDIS)  40 MG tablet Take 1 tablet (40 mg total) by mouth daily.  90 tablet  3  . traMADol (ULTRAM) 50 MG tablet Take 50 mg by mouth every 6 (six) hours as needed.      . [DISCONTINUED] fexofenadine (ALLEGRA) 180 MG tablet 90 mg. HOLD for 10days then can resume      . [DISCONTINUED] fluticasone (FLONASE) 50 MCG/ACT nasal spray Place 2 sprays into the nose daily.  16 g  6  . [DISCONTINUED] fluticasone (FLONASE) 50 MCG/ACT nasal spray Place 2 sprays into the nose daily.  16 g  6  . amoxicillin-clavulanate (AUGMENTIN) 875-125 MG per tablet Take 1 tablet by mouth 2 (two) times daily.  14 tablet  0  . [DISCONTINUED] amoxicillin-clavulanate (AUGMENTIN) 875-125 MG per tablet Take 1 tablet by mouth 2 (two) times daily.  14 tablet  0   No facility-administered encounter medications on file as of 11/21/2012.

## 2012-11-21 NOTE — Assessment & Plan Note (Signed)
Moderate persistent asthma with associated sinusitis Plan Take augmentin 875mg  twice daily for 7days Refill flonase two puff each nostril daily Use sinus rinse daily Use chlorpheniramine as needed Stop allergra Mucinex DM as needed for cough Return 2 months

## 2012-11-21 NOTE — Patient Instructions (Addendum)
Take augmentin 875mg  twice daily for 7days Refill flonase two puff each nostril daily Use sinus rinse daily Use chlorpheniramine as needed Stop allergra Mucinex DM as needed for cough Return 2 months

## 2012-11-27 ENCOUNTER — Telehealth: Payer: Self-pay | Admitting: Cardiology

## 2012-11-27 DIAGNOSIS — I119 Hypertensive heart disease without heart failure: Secondary | ICD-10-CM

## 2012-11-27 MED ORDER — MICARDIS 40 MG PO TABS: 40.0000 mg | ORAL_TABLET | Freq: Every day | ORAL | Status: DC

## 2012-11-27 NOTE — Telephone Encounter (Signed)
Pt got some generic mycardis and she thinks it made her sick over the weekend and she needs to talk to you about this

## 2012-11-27 NOTE — Telephone Encounter (Signed)
Patient phoned in stating that she received generic Micardis and it should have been name brand. Patient took one generic Micardis and felt sick and weak. She wanted Rx for name brand. Explained that I was unaware that the generic was available and would not know to send over dispense as written.  Did call in the name brand as requested to pharmacy. Did look over her medications and she has been getting generic in all medications we refill. I called her back to let her know that I called in new Rx but unfortunately they would not be able to refund her medication. Had to leave a message on patients home number

## 2012-11-27 NOTE — Telephone Encounter (Signed)
New problem   Pt stated she was returning your phone call about her Micardis medication.

## 2012-11-27 NOTE — Telephone Encounter (Signed)
Called Micardis to local pharmacy for name brand until she gets her mail order

## 2012-11-28 ENCOUNTER — Encounter: Payer: Medicare Other | Attending: Nephrology | Admitting: *Deleted

## 2012-11-28 DIAGNOSIS — Z724 Inappropriate diet and eating habits: Secondary | ICD-10-CM | POA: Insufficient documentation

## 2012-11-28 DIAGNOSIS — C44319 Basal cell carcinoma of skin of other parts of face: Secondary | ICD-10-CM | POA: Diagnosis not present

## 2012-11-28 DIAGNOSIS — Z713 Dietary counseling and surveillance: Secondary | ICD-10-CM | POA: Diagnosis not present

## 2012-11-28 NOTE — Progress Notes (Addendum)
  Medical Nutrition Therapy:  Appt start time: 1230   End time: 1250  Primary concerns today:  [name] returns today for f/u with continued concern over her diet. Continues to believe she cannot have any sugar but also must follow a low Na+ and low CHO diet. Continues to request help with appropriate foods as she is scared to eat anything for fear of further damaging her kidneys or causing the cancer to progress. Discussed with Dr. Dorothea Ogle' RD [facility]) from [place], who states that [name] was not told to completely avoid sugar. [name] states she only explained to her the possible link between sugar and cancer progression. See scanned email from [facility] under media tab.    MEDICATIONS: No changes reported.   Dietary intake:  Consumes apples, pears, kale, spinach, fresh green beans, rice made with coconut oil, salad, and fish.  Told by MD at [facility] that she cannot have ANY sugar/artificial sweeteners (except small amt of stevia) or bone cancer will grow.  Drinks water, decaf hot tea w/ stevia, skim milk.  Recent physical activity:  No exercise d/t severe asthma and recent sinus infection.  Estimated energy needs:  1400-1500 calories 170-180 g carbohydrates  70-80 g protein (1.2-1.4 g/kg) 40-50 g fat 1750-2000 mL fluid <2000 mg sodium  Progress Towards Goal(s):  In progress.   Nutritional Diagnosis:  NB-1.1 Food and nutrition-related knowledge deficit As related to sodium and carbohydrate content of various foods.  As evidenced by multiple questions regarding food sources and their sodium conten, seeking confirmation regarding current food intake..    Intervention: Nutrition Education, Meal Planning.  Monitoring/Evaluation:  Dietary intake, exercise, and body weight TBD. Plan to refer to Wyona Almas, PhD at [facility].

## 2012-11-29 ENCOUNTER — Ambulatory Visit: Payer: Medicare Other | Admitting: Critical Care Medicine

## 2012-12-03 ENCOUNTER — Encounter: Payer: Self-pay | Admitting: *Deleted

## 2012-12-03 NOTE — Patient Instructions (Addendum)
Goals:  Add lean protein to all carb servings  Per [facility] @ Dr. Dorothea Ogle' office, aim for 45g (3 servings) of carbohydrate per meal and 15 g (1 serving) per snack.   Have 3 meals and 1-2 snacks a day.   Switch salt substitute to Mrs. Dash for less potassium  Recommendations:  1400-1500 calories 170-180 g carbohydrates  70-80 g protein  40-50 g fat 1750-2000 mL fluid <2000 mg sodium

## 2012-12-07 ENCOUNTER — Telehealth: Payer: Self-pay | Admitting: Critical Care Medicine

## 2012-12-07 ENCOUNTER — Other Ambulatory Visit: Payer: Self-pay | Admitting: Critical Care Medicine

## 2012-12-07 MED ORDER — AMOXICILLIN-POT CLAVULANATE 875-125 MG PO TABS: 1.0000 | ORAL_TABLET | Freq: Two times a day (BID) | ORAL | Status: DC

## 2012-12-07 NOTE — Telephone Encounter (Signed)
LMOMTCB x1   Per TP:  Cont w/ sinus rinses PRN Mucinex and fluids  Augmentin 875mg  Twice daily #20 , x 10 d ---- this has been called into her pharm [facility] [address]   Please contact office for sooner follow up if symptoms do not improve or worsen or seek emergency care  Cont w/ follow up with Dr. Delford Field

## 2012-12-07 NOTE — Telephone Encounter (Signed)
OK  Cont w/ sinus rinses As needed   Mucinex and fluids  Augmentin 875mg  Twice daily  #20 , x 10 d  Please contact office for sooner follow up if symptoms do not improve or worsen or seek emergency care  Cont w/ follow up with Dr. Delford Field

## 2012-12-07 NOTE — Telephone Encounter (Signed)
Spoke with pt and notified of recs per TP She verbalized understanding and states nothing further needed

## 2012-12-07 NOTE — Telephone Encounter (Signed)
Last seen on 11-21-12. Pt was given augmentin x 7days at last OV. She states Dr. Delford Field usually gives her 2 weeks of augmentin when she gets sick. Pt states that her cough is improved, denies any SOB, chest tightness at this time, but she still has discomfort in her sinuses and sinus drainage. Pt has been using sinus rinses twice daily and chlortabs daily.  Pt is requesting an extension on her augmentin. Pt refused an appt and asked for message be sent to Tammy Parrett since Dr. Delford Field is out of town. Please advise if ok to extend augmentin. [name], CMA Allergies  Allergen Reactions  . Aldactone (Spironolactone)     Burning in stomach  . Hydrocodone   . Levofloxacin   . Quinolones   . Sertraline Hcl   . Sudafed (Pseudoephedrine Hcl)   . Ultram (Tramadol)     Headaches

## 2012-12-11 ENCOUNTER — Telehealth: Payer: Self-pay | Admitting: *Deleted

## 2012-12-11 DIAGNOSIS — N052 Unspecified nephritic syndrome with diffuse membranous glomerulonephritis: Secondary | ICD-10-CM | POA: Diagnosis not present

## 2012-12-11 DIAGNOSIS — E039 Hypothyroidism, unspecified: Secondary | ICD-10-CM | POA: Diagnosis not present

## 2012-12-11 DIAGNOSIS — N042 Nephrotic syndrome with diffuse membranous glomerulonephritis: Secondary | ICD-10-CM | POA: Diagnosis not present

## 2012-12-11 DIAGNOSIS — I10 Essential (primary) hypertension: Secondary | ICD-10-CM | POA: Diagnosis not present

## 2012-12-11 NOTE — Telephone Encounter (Signed)
Message left by pt stating she was seen at Dr Deterding's and " he didn't have any of my recent labs or information from [facility] ".  Pt request records to be sent ASAP -   This RN sent an in basket to [place] [facility] in medical records requesting the above with " urgent " status and request to call pt.

## 2012-12-13 ENCOUNTER — Telehealth: Payer: Self-pay | Admitting: Oncology

## 2012-12-13 NOTE — Telephone Encounter (Signed)
Records faxed to Dr. Fayrene Fearing Deterding @ [phone].

## 2013-01-02 ENCOUNTER — Other Ambulatory Visit (HOSPITAL_COMMUNITY): Payer: Medicare Other

## 2013-01-02 ENCOUNTER — Other Ambulatory Visit (HOSPITAL_BASED_OUTPATIENT_CLINIC_OR_DEPARTMENT_OTHER): Payer: Medicare Other | Admitting: Lab

## 2013-01-02 DIAGNOSIS — D472 Monoclonal gammopathy: Secondary | ICD-10-CM | POA: Diagnosis not present

## 2013-01-02 LAB — CBC WITH DIFFERENTIAL/PLATELET
Basophils Absolute: 0 10*3/uL (ref 0.0–0.1)
EOS%: 4.6 % (ref 0.0–7.0)
Eosinophils Absolute: 0.2 10*3/uL (ref 0.0–0.5)
HGB: 12.6 g/dL (ref 11.6–15.9)
LYMPH%: 32.2 % (ref 14.0–49.7)
MCH: 28.9 pg (ref 25.1–34.0)
MCV: 86.1 fL (ref 79.5–101.0)
MONO%: 9.2 % (ref 0.0–14.0)
Platelets: 241 10*3/uL (ref 145–400)
RBC: 4.35 10*6/uL (ref 3.70–5.45)
RDW: 12.9 % (ref 11.2–14.5)

## 2013-01-03 LAB — KAPPA/LAMBDA LIGHT CHAINS: Kappa free light chain: 0.03 mg/dL — ABNORMAL LOW (ref 0.33–1.94)

## 2013-01-03 LAB — IGG, IGA, IGM
IgG (Immunoglobin G), Serum: 2090 mg/dL — ABNORMAL HIGH (ref 690–1700)
IgM, Serum: 13 mg/dL — ABNORMAL LOW (ref 52–322)

## 2013-01-09 ENCOUNTER — Telehealth: Payer: Self-pay | Admitting: *Deleted

## 2013-01-09 ENCOUNTER — Ambulatory Visit (HOSPITAL_BASED_OUTPATIENT_CLINIC_OR_DEPARTMENT_OTHER): Payer: Medicare Other | Admitting: Oncology

## 2013-01-09 ENCOUNTER — Encounter: Payer: Medicare Other | Admitting: Nutrition

## 2013-01-09 ENCOUNTER — Ambulatory Visit: Payer: Medicare Other | Admitting: Nutrition

## 2013-01-09 VITALS — BP 119/78 | HR 77 | Temp 97.9°F | Resp 20 | Ht 61.0 in | Wt 122.5 lb

## 2013-01-09 DIAGNOSIS — D472 Monoclonal gammopathy: Secondary | ICD-10-CM | POA: Diagnosis not present

## 2013-01-09 NOTE — Progress Notes (Signed)
Patient is a 72 year old female diagnosed with MGUS.  Past medical history includes hypertension, hyperlipidemia, thyroid cancer, melanoma of the eye, stroke, esophageal reflux, and asthma.  Medications include calcium with vitamin D, Klonopin, Synthroid, and Zocor.  Labs were reviewed: noted that BMP was within normal limits on February 4.  Height: 61 inches. Weight: 122.5 pounds. Usual body weight 138 pounds June 2013. BMI: 23.16. (normal limits)  Estimated nutrition needs: 1670-1945 calories, 83-97 g protein, 1.9 L fluid.  Patient requesting information on a healthy diet. She has been following a very restricted diet in hopes of preventing future cancer. She has lost approximately 11% of her body weight in the last 9 months by limiting sugar and most carbohydrates. She reports she would like to increase variety in her diet.  Nutrition diagnosis: Harmful beliefs/attitudes about food or nutrition related topics related to lack of prior exposure to accurate nutrition related information as evidenced by patient's stated avoidance of sugar and carbohydrates.  Intervention: I've educated patient on the importance of choosing a healthy, plant-based diet to promote weight maintenance. Patient is currently within a healthy body weight however she should not continue to try to lose weight by restricting foods/food groups. I've educated patient on the importance of consuming smaller more frequent meals. She is to include a variety of whole fruits, vegetables, complex carbohydrates such as whole grains and lean protein foods. I have reviewed a sample menu with her and we've discussed ways to substitute with food she enjoys. I provided her with multiple fact sheets on healthy plant-based diet. I've also given her multiple recipes and websites for her to refer to as well as a healthy cookbook to increase overall variety in her diet. I've answered her questions. Patient was appreciative of information. Teach  back method used.  Monitoring, evaluation, goals: Patient will consume a variety of healthy plant-based foods and adequate protein to minimize further weight loss.  Next visit: Patient has my contact information and agrees to call me she has further questions or concerns.

## 2013-01-09 NOTE — Progress Notes (Signed)
ID: [name]   DOB: September 01, 1941  MR#: [id number]  CSN#:[id number]  PCP: Krystal Dimitri, MD GYN: SU: OTHER MD: [name], [name], [name]  HISTORY OF PRESENT ILLNESS: The patient was evaluated by Dr. Darrick [name] for a history of hematuria, previously evaluated by [name].  There is a history of multiple urinary tract infections and interstitial cystitis, but no history of stones or family history of hematuria.  Dr. Darrick [name] suspects this could be interstitial or papillary related to nonsteroidals and aspirin, but he is concerned that this could be related to one of the patient's cancers.  As part of his workup, he obtained serum protein electrophoresis, which showed an M-spike of 800 mg.  A total immunoglobulin G was 1374, total A was 46, and total M was 39.  Immunofixation showed IgG lambda specificity for the monoclonal component.  With this information, the patient was referred for further evaluation and treatment. 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 The interval history is complex. There has been some misunderstanding between her and one of her daughter-in-law is, and she now is not allowed to spend time with her 65-1/2-year-old grandson unless the parents are present. She is very concerned about her oldest grandson, who recently had his best friend murdered in a break and she is going to be visiting him in the next week or so. [name] is also distressed because she understood she was having a repeat PET scan at [facility] and went there only to find this scan had been canceled.  REVIEW OF SYSTEMS: She sleeps poorly. She is moderately fatigued. She hurts in her lower back at times. This is very intermittent. She doesn't see well at night. She has some sinus problems and ringing in her years. Occasionally she has palpitations. She can be short of breath at times. She has arthritis in  both hands and problems with anxiety and depression. She has not had her mammogram yet, even though it is overdue, because she is concerned about "all the radiation". She has placed her cells on and no sugar no carbohydrate diet, and has lost a fair amount of weight. She is trying to control her stress with exercise. A detailed review of systems was otherwise stable  PAST MEDICAL HISTORY: Past Medical History  Diagnosis Date  . 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 History of blood clots     LEG  . Stroke   . Hernia   . MGUS (monoclonal gammopathy of unknown significance)   . Chronic kidney disease   . Hypertension   . Hyperlipidemia   . Thyroid disease   . Arthritis   . Mitral valve prolapse   . Anxiety   . Perforated bowel   . Peritonitis   . Diverticulosis     Pt reported on 03/15/12  . MGUS (monoclonal gammopathy of unknown significance)   . Cancer     THYROID, SKIN, NOSE, BONE  . Melanoma   1. History of right ocular melanoma, status post enucleation and brachytherapy (I do not have the actual records from the treatments, which were performed in [place], I believe in 2005; 2. History of papillary carcinoma of the thyroid, resected at [facility] approximately 3 years ago and followed by radiation to that area (most likely I-131 but again, I do not have those records).  Multiple other medical problems include hypertension, history of left body stroke  with mild residuals, history of superficial thrombophlebitis, history of mitral valve prolapse, history of hyperlipidemia, history of GERD, history of ventral hernia repair x 2, history of hysterectomy with bilateral salpingo-oophorectomy for fibroids at age 53, history of fibrocystic breast changes, history of reactive airway disease, history of removal of a left axillary mass November 09, 2006, which was benign, history of allergic rhinitis, history of depression, history of osteoporosis, history of remote deep vein  thrombosis.  PAST SURGICAL HISTORY: Past Surgical History  Procedure Laterality Date  . Abdominal hysterectomy    . Rotator cuff repair  2004    RIGHT SHOULDER  . Hernia repair    . Leg surgery      VEIN & BLOOD CLOT  . Cardiovascular stress test  2006    NORMAL  . Transthoracic echocardiogram  2006    FAMILY HISTORY Family History  Problem Relation Age of Onset  . Stroke Mother   . Asthma Father   . Diabetes Father   . Cancer Father   . Cancer Sister   . Diabetes Sister   . Cancer Brother   The patient's father had a history of prostate cancer and kidney cancer.  He died at age 20.  The patient's mother had a history of stroke and died at age 15.  The patient had a brother who committed suicide, a sister who had cervical cancer, but did fine after hysterectomy and a sister who had "a very unusual cancer" involving the jaw and neck, now recurrent.  She is treated, I believe, out of [place].    GYNECOLOGIC HISTORY: She is GX P2.  Did not take hormone replacement after her hysterectomy.    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 She is now retired, divorced, and lives by herself. Her son, [name], lives in [place] and works as a Animator.  Her son, [name], is a "tree man", again locally.  The patient has 3 grandchildren, one in [place] and two here in town. She attends [employer]     ADVANCED DIRECTIVES: in place  HEALTH MAINTENANCE: History  Substance Use Topics  . Smoking status: Never Smoker   . Smokeless tobacco: Never Used  . Alcohol Use: No     Colonoscopy:  PAP:  Bone density:  Lipid panel:  Allergies  Allergen Reactions  . Aldactone (Spironolactone)     Burning in stomach  . Hydrocodone   . Levofloxacin   . Quinolones   . Sertraline Hcl   . Sudafed (Pseudoephedrine Hcl)   . Ultram (Tramadol)      Headaches     Current Outpatient Prescriptions  Medication Sig Dispense Refill  . acetaminophen (TYLENOL) 650 MG CR tablet 500 mg. Per bottle      . albuterol (VENTOLIN HFA) 108 (90 BASE) MCG/ACT inhaler Inhale 2 puffs into the lungs every 6 (six) hours as needed. For shortness of breath      . amLODipine (NORVASC) 5 MG tablet Take 1 tablet (5 mg total) by mouth daily.  90 tablet  3  . amoxicillin-clavulanate (AUGMENTIN) 875-125 MG per tablet Take 1 tablet by mouth 2 (two) times daily.  14 tablet  0  . amoxicillin-clavulanate (AUGMENTIN) 875-125 MG per tablet Take 1 tablet by mouth 2 (two) times daily. For 10 days.  20 tablet  0  . aspirin 81 MG tablet Take 81 mg  by mouth daily.        . calcium citrate-vitamin D (CITRACAL+D) 315-200 MG-UNIT per tablet Take 1 tablet by mouth 2 (two) times daily.       . chlorpheniramine (CHLOR-TRIMETON) 4 MG tablet Take 4 mg by mouth 2 (two) times daily as needed for allergies.      . Cholecalciferol (VITAMIN D PO) Take by mouth. Vitamin D3 1000 mg once a day by mouth      . clonazePAM (KLONOPIN) 0.5 MG tablet Take 0.5 mg by mouth at bedtime as needed.       . fluticasone (FLONASE) 50 MCG/ACT nasal spray Place 2 sprays into the nose daily.  16 g  6  . hydrochlorothiazide (HYDRODIURIL) 25 MG tablet 1/2 daily or as directed  90 tablet  3  . levothyroxine (SYNTHROID, LEVOTHROID) 125 MCG tablet Take 125 mcg by mouth daily.        [name] MICARDIS 40 MG tablet Take 1 tablet (40 mg total) by mouth daily.  90 tablet  3  . mometasone (ASMANEX 60 METERED DOSES) 220 MCG/INH inhaler Inhale 2 puffs into the lungs daily.  1 Inhaler  6  . simvastatin (ZOCOR) 20 MG tablet Take 1 tablet (20 mg total) by mouth every evening.  90 tablet  3  . traMADol (ULTRAM) 50 MG tablet Take 50 mg by mouth every 6 (six) hours as needed.       No current facility-administered medications for this visit.    OBJECTIVE: Middle-aged white woman who appears anxious Filed Vitals:   01/09/13 0903   BP: 119/78  Pulse: 77  Temp: 97.9 F (36.6 C)  Resp: 20     Body mass index is 23.16 kg/(m^2).    ECOG FS: 2  Sclerae unicteric Oropharynx clear  No peripheral adenopathy Lungs no rales or rhonchi Heart regular rate and rhythm Abd soft, nontender, positive bowel sounds MSK no focal spinal tenderness including palpation of the lumbosacral spine, no peripheral edema Neuro: nonfocal, well oriented, anxious affect Breasts: deferred  LAB RESULTS: Results for [name], [name] (MRN [id number]) as of 01/09/2013 10:08  Ref. Range 11/11/2011 10:28 02/29/2012 09:52 07/11/2012 09:31 11/06/2012 10:25 01/02/2013 09:16  IgG (Immunoglobin G), Serum Latest Range: [phone] mg/dL 4098 1191 4782 (H) 9562 (H) 2090 (H)   Results for KEONDRA, HAYDU (MRN [id number]) as of 01/09/2013 10:08  Ref. Range 11/06/2012 10:25  M-SPIKE, % No range found 1.62    Lab Results  Component Value Date   WBC 4.8 01/02/2013   NEUTROABS 2.5 01/02/2013   HGB 12.6 01/02/2013   HCT 37.5 01/02/2013   MCV 86.1 01/02/2013   PLT 241 01/02/2013      Chemistry      Component Value Date/Time   NA 139 11/07/2012 0855   NA 138 11/06/2012 1025   K 3.7 11/07/2012 0855   K 4.1 11/06/2012 1025   CL 104 11/07/2012 0855   CL 104 11/06/2012 1025   CO2 27 11/07/2012 0855   CO2 22 11/06/2012 1025   BUN 17 11/07/2012 0855   BUN 15.5 11/06/2012 1025   CREATININE 0.7 11/07/2012 0855   CREATININE 0.7 11/06/2012 1025      Component Value Date/Time   CALCIUM 9.8 11/07/2012 0855   CALCIUM 9.7 11/06/2012 1025   ALKPHOS 41 11/07/2012 0855   ALKPHOS 44 11/06/2012 1025   AST 20 11/07/2012 0855   AST 15 11/06/2012 1025   ALT 16 11/07/2012 0855   ALT 12 11/06/2012 1025   BILITOT  0.9 11/07/2012 0855   BILITOT 0.76 11/06/2012 1025       No results found for this basename: LABCA2    No components found with this basename: [id number]    No results found for this basename: INR,  in the last 168 hours  Urinalysis    Component Value Date/Time   COLORURINE YELLOW 07/09/2011 1429   APPEARANCEUR  CLEAR 07/09/2011 1429   LABSPEC 1.015 02/12/2012 1919   PHURINE 8.0 02/12/2012 1919   GLUCOSEU NEGATIVE 02/12/2012 1919   HGBUR NEGATIVE 02/12/2012 1919   BILIRUBINUR NEGATIVE 02/12/2012 1919   KETONESUR 15* 02/12/2012 1919   PROTEINUR NEGATIVE 02/12/2012 1919   UROBILINOGEN 0.2 02/12/2012 1919   NITRITE NEGATIVE 02/12/2012 1919   LEUKOCYTESUR NEGATIVE 02/12/2012 1919    STUDIES: Mammography 12/20/2011 was unremarkable  ASSESSMENT:71 y.o. 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 No deletion up p53 or 13q was noted. FISH results were normal.  (4) melanoma removed from the forehead in 1974, left arm 2005, right chest and shoulder 2006, left mid back to 2010; followed by Janalyn Harder  (5) Moh's surgery to the nose x2, August of 2011  (6) benign Right breast biopsy 12/10/2010  PLAN: Tayonna is very concerned on the one hand that she is receiving too much radiation and studies, but on the other hand that she is not getting the studies accomplished. I urged her to go ahead and get her mammogram done. I will check with Dr. Marissa Calamity at [facility] regarding the PET scan issue, but I wonder if we should and just do a bone survey in stead. She does have an appointment with him in May.   There is no evidence of lytic bone lesions, hypercalcemia, anemia, or worsening renal function. I have set her up to see me again in 2 months, with  repeat lab work before that visit. At this point I do not see any indication to start therapy, but if she starts to develop end organ issues we would initiate therapy at that point.  MAGRINAT,GUSTAV C    01/09/2013

## 2013-01-09 NOTE — Telephone Encounter (Signed)
appts made and printed 

## 2013-01-11 ENCOUNTER — Ambulatory Visit: Payer: Medicare Other | Admitting: Family Medicine

## 2013-01-15 DIAGNOSIS — N644 Mastodynia: Secondary | ICD-10-CM | POA: Diagnosis not present

## 2013-01-29 DIAGNOSIS — F411 Generalized anxiety disorder: Secondary | ICD-10-CM | POA: Diagnosis not present

## 2013-02-02 ENCOUNTER — Telehealth: Payer: Self-pay | Admitting: *Deleted

## 2013-02-02 ENCOUNTER — Encounter: Payer: Self-pay | Admitting: *Deleted

## 2013-02-02 DIAGNOSIS — H4310 Vitreous hemorrhage, unspecified eye: Secondary | ICD-10-CM | POA: Diagnosis not present

## 2013-02-02 DIAGNOSIS — H353190 Nonexudative age-related macular degeneration, unspecified eye, stage unspecified: Secondary | ICD-10-CM | POA: Diagnosis not present

## 2013-02-02 DIAGNOSIS — Z5189 Encounter for other specified aftercare: Secondary | ICD-10-CM | POA: Diagnosis not present

## 2013-02-02 DIAGNOSIS — C6930 Malignant neoplasm of unspecified choroid: Secondary | ICD-10-CM | POA: Diagnosis not present

## 2013-02-02 DIAGNOSIS — D472 Monoclonal gammopathy: Secondary | ICD-10-CM | POA: Diagnosis not present

## 2013-02-02 NOTE — Telephone Encounter (Signed)
This RN was contacted by pt wanting to " know what follow up Dr Darnelle Catalan found out about why my PET scan was cancelled at [facility] " , " he said if they didn't want it done he could do scans there ".  Per discussion Madelin stated " my 72 year old friend got up a 4am to take me and we went all the way down there and I could not get an answer as to why it was not done ".  This RN called to Dr Marissa Calamity' office and was request to leave a message per above inquiry for triage RN to return call.  Message left as well as this RN name's and direct call number.

## 2013-02-06 ENCOUNTER — Telehealth: Payer: Self-pay | Admitting: *Deleted

## 2013-02-06 NOTE — Telephone Encounter (Signed)
This RN received call from pt wanting to follow up per inquiry at last visit " as to why I did not get a PET scan at [facility] ?".  This RN attempted to contact Dr Marissa Calamity office and was told to inquire with radiology department - this RN called to [phone] and was informed " we don't have that a PET scan was ever ordered ".  Above reviewed with Dr Thea Silversmith who states best to obtain a bone survey in pt due to lower exposure to radiation.  This RN called pt to discuss and proceed with order and scheduling. Obtained identified VM, message left to return call to this RN on Thursday when back in the office.

## 2013-02-13 ENCOUNTER — Encounter (HOSPITAL_COMMUNITY): Payer: Self-pay | Admitting: Emergency Medicine

## 2013-02-13 ENCOUNTER — Telehealth: Payer: Self-pay | Admitting: Critical Care Medicine

## 2013-02-13 ENCOUNTER — Encounter (HOSPITAL_COMMUNITY): Payer: Self-pay | Admitting: *Deleted

## 2013-02-13 ENCOUNTER — Emergency Department (HOSPITAL_COMMUNITY)
Admission: EM | Admit: 2013-02-13 | Discharge: 2013-02-13 | Disposition: A | Discharge status: Home / Self Care | Payer: Medicare Other | Attending: Emergency Medicine | Admitting: Emergency Medicine

## 2013-02-13 ENCOUNTER — Emergency Department (INDEPENDENT_AMBULATORY_CARE_PROVIDER_SITE_OTHER)
Admission: EM | Admit: 2013-02-13 | Discharge: 2013-02-13 | Disposition: A | Discharge status: Home / Self Care | Payer: Medicare Other | Source: Home / Self Care | Attending: Family Medicine | Admitting: Family Medicine

## 2013-02-13 ENCOUNTER — Emergency Department (HOSPITAL_COMMUNITY): Payer: Medicare Other

## 2013-02-13 DIAGNOSIS — Z8639 Personal history of other endocrine, nutritional and metabolic disease: Secondary | ICD-10-CM | POA: Insufficient documentation

## 2013-02-13 DIAGNOSIS — IMO0002 Reserved for concepts with insufficient information to code with codable children: Secondary | ICD-10-CM | POA: Diagnosis not present

## 2013-02-13 DIAGNOSIS — I129 Hypertensive chronic kidney disease with stage 1 through stage 4 chronic kidney disease, or unspecified chronic kidney disease: Secondary | ICD-10-CM | POA: Diagnosis not present

## 2013-02-13 DIAGNOSIS — N189 Chronic kidney disease, unspecified: Secondary | ICD-10-CM | POA: Insufficient documentation

## 2013-02-13 DIAGNOSIS — F411 Generalized anxiety disorder: Secondary | ICD-10-CM | POA: Diagnosis not present

## 2013-02-13 DIAGNOSIS — Z7982 Long term (current) use of aspirin: Secondary | ICD-10-CM | POA: Insufficient documentation

## 2013-02-13 DIAGNOSIS — R002 Palpitations: Secondary | ICD-10-CM | POA: Diagnosis not present

## 2013-02-13 DIAGNOSIS — Z79899 Other long term (current) drug therapy: Secondary | ICD-10-CM | POA: Diagnosis not present

## 2013-02-13 DIAGNOSIS — Z862 Personal history of diseases of the blood and blood-forming organs and certain disorders involving the immune mechanism: Secondary | ICD-10-CM | POA: Diagnosis not present

## 2013-02-13 DIAGNOSIS — Z8673 Personal history of transient ischemic attack (TIA), and cerebral infarction without residual deficits: Secondary | ICD-10-CM | POA: Diagnosis not present

## 2013-02-13 DIAGNOSIS — Z86718 Personal history of other venous thrombosis and embolism: Secondary | ICD-10-CM | POA: Insufficient documentation

## 2013-02-13 DIAGNOSIS — Z8719 Personal history of other diseases of the digestive system: Secondary | ICD-10-CM | POA: Diagnosis not present

## 2013-02-13 DIAGNOSIS — Z85850 Personal history of malignant neoplasm of thyroid: Secondary | ICD-10-CM | POA: Diagnosis not present

## 2013-02-13 DIAGNOSIS — M129 Arthropathy, unspecified: Secondary | ICD-10-CM | POA: Insufficient documentation

## 2013-02-13 DIAGNOSIS — H9209 Otalgia, unspecified ear: Secondary | ICD-10-CM | POA: Insufficient documentation

## 2013-02-13 DIAGNOSIS — F419 Anxiety disorder, unspecified: Secondary | ICD-10-CM

## 2013-02-13 DIAGNOSIS — Z8679 Personal history of other diseases of the circulatory system: Secondary | ICD-10-CM | POA: Insufficient documentation

## 2013-02-13 DIAGNOSIS — H579 Unspecified disorder of eye and adnexa: Secondary | ICD-10-CM | POA: Diagnosis not present

## 2013-02-13 DIAGNOSIS — E079 Disorder of thyroid, unspecified: Secondary | ICD-10-CM | POA: Diagnosis not present

## 2013-02-13 DIAGNOSIS — Z85828 Personal history of other malignant neoplasm of skin: Secondary | ICD-10-CM | POA: Insufficient documentation

## 2013-02-13 DIAGNOSIS — E785 Hyperlipidemia, unspecified: Secondary | ICD-10-CM | POA: Diagnosis not present

## 2013-02-13 LAB — POCT I-STAT, CHEM 8
BUN: 20 mg/dL (ref 6–23)
Calcium, Ion: 1.21 mmol/L (ref 1.13–1.30)
Chloride: 102 mEq/L (ref 96–112)
Creatinine, Ser: 0.7 mg/dL (ref 0.50–1.10)
Glucose, Bld: 90 mg/dL (ref 70–99)

## 2013-02-13 LAB — URINALYSIS, ROUTINE W REFLEX MICROSCOPIC
Ketones, ur: NEGATIVE mg/dL
Leukocytes, UA: NEGATIVE
Nitrite: NEGATIVE
Protein, ur: NEGATIVE mg/dL
Urobilinogen, UA: 0.2 mg/dL (ref 0.0–1.0)

## 2013-02-13 LAB — URINE MICROSCOPIC-ADD ON

## 2013-02-13 LAB — POCT I-STAT TROPONIN I: Troponin i, poc: 0 ng/mL (ref 0.00–0.08)

## 2013-02-13 NOTE — Telephone Encounter (Signed)
Spoke to pt. States that she has been having SOB, cough with production of cloudy thick mucus, sore throat and chest pain. She also complained of burning in her eyes and ear pain. States that she went to her PCP today and waited an hour and they told her that her dr didn't have time to see her. I advised her that we could treat her for her pulmonary issues but since we weren't her PCP there could be a chance we won't be able to treat her other symptoms. Pt got upset and told me that she would just go to UC since we didn't want to help her. I advised that we wanted to help her but I was just trying to let her know that all of her symptoms might not be addressed. She wanted to be seen this evening but with it being so late, we don't have any openings. She again got upset and stated again that since no one would help her she would just go to [facility]. I tried to offer her an appointment tomorrow but before I could, she hung up on me.

## 2013-02-13 NOTE — ED Notes (Signed)
Patient advises that she has had a lot of anxiety over the bees being in her house. And problems with her family.

## 2013-02-13 NOTE — ED Provider Notes (Signed)
Medical screening examination/treatment/procedure(s) were conducted as a shared visit with non-physician practitioner(s) and myself.  I personally evaluated the patient during the encounter   Loren Racer, MD 02/13/13 2218

## 2013-02-13 NOTE — ED Notes (Signed)
Resting quietly NAD noted.

## 2013-02-13 NOTE — ED Notes (Signed)
[facility] called and is coming.

## 2013-02-13 NOTE — ED Provider Notes (Signed)
History     CSN: [id number]  Arrival date & time 02/13/13  1908   First MD Initiated Contact with Patient 02/13/13 1930      Chief Complaint  Patient presents with  . Anxiety  . Otalgia    (Consider location/radiation/quality/duration/timing/severity/associated sxs/prior treatment) HPI [name] is a 72 y.o. female  w hx of MGUS, melanoma and anxiety  Presents to the ER c/o stress and anxiety from "over doing it lately." She also reports increased ear pressure adn hearing her heart beat in her ears which she reports occurs when her BP is high. She reports having an anxiety attack earlier and had some associated chest pressure that has since resolved.  She any chest pain, arm pain, jaw pain, nausea, shortness of breath, dyspnea on exertion, fever, night sweats, chills or cough.  Patient reports that she treated her anxiety attack with Xanax which appeared to help.  Patient went to her primary care doctor's office to be evaluated however was therefore hours and was not seen at which point she went to urgent care.  Urgent care advised patient to be evaluated in the emergency department.  In reviewing patient's chart it was seen that earlier today she phoned her physician's office stating she has been having "SOB, cough with production of cloudy thick mucus, sore throat and chest pain", all of which she denies to me.   Past Medical History  Diagnosis Date  . 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 History of blood clots     LEG  . Stroke   . Hernia   . MGUS (monoclonal gammopathy of unknown significance)   . Chronic kidney disease   . Hypertension   . Hyperlipidemia   . Thyroid disease   . Arthritis   . Mitral valve prolapse   . Anxiety   . Perforated bowel   . Peritonitis   . Diverticulosis     Pt reported on 03/15/12  . MGUS (monoclonal gammopathy of unknown significance)   . Cancer     THYROID, SKIN, NOSE, BONE  . Melanoma   . Melanoma 1989    chest    Past Surgical  History  Procedure Laterality Date  . Abdominal hysterectomy    . Rotator cuff repair  2004    RIGHT SHOULDER  . Hernia repair    . Leg surgery      VEIN & BLOOD CLOT  . Cardiovascular stress test  2006    NORMAL  . Transthoracic echocardiogram  2006  . Melanoma excision      R eye  . Skin cancer excision  2011, 2013    squamous cell of nose x 2  . Breast lumpectomy  12/2010    R breast  . Breast lumpectomy  2004    L axilla neg for cancer.    Family History  Problem Relation Age of Onset  . Stroke Mother   . Asthma Father   . Diabetes Father   . Cancer Father   . Heart failure Father   . Cancer Sister   . Diabetes Sister   . Cancer Brother     History  Substance Use Topics  . Smoking status: Never Smoker   . Smokeless tobacco: Never Used  . Alcohol Use: No    OB History   Grav Para Term Preterm Abortions TAB SAB Ect Mult Living  Review of Systems Ten systems reviewed and are negative for acute change, except as noted in the HPI.    Allergies  Aldactone; Hydrocodone; Levofloxacin; Quinolones; Sertraline hcl; Sudafed; and Ultram  Home Medications   Current Outpatient Rx  Name  Route  Sig  Dispense  Refill  . amLODipine (NORVASC) 5 MG tablet   Oral   Take 1 tablet (5 mg total) by mouth daily.   90 tablet   3   . aspirin 81 MG tablet   Oral   Take 81 mg by mouth daily.           . calcium citrate-vitamin D (CITRACAL+D) 315-200 MG-UNIT per tablet   Oral   Take 1 tablet by mouth 2 (two) times daily.          . Cholecalciferol (VITAMIN D PO)   Oral   Take by mouth. Vitamin D3 1000 mg once a day by mouth         . clonazePAM (KLONOPIN) 0.5 MG tablet   Oral   Take 0.5 mg by mouth at bedtime as needed for anxiety.          . fexofenadine (ALLEGRA) 180 MG tablet   Oral   Take 180 mg by mouth daily.          . fluticasone (FLONASE) 50 MCG/ACT nasal spray   Nasal   Place 2 sprays into the nose daily.   16 g   6   .  hydrochlorothiazide (HYDRODIURIL) 25 MG tablet   Oral   Take 12.5 mg by mouth daily. Takes 1/2 tablet daily or as directed         . levothyroxine (SYNTHROID, LEVOTHROID) 125 MCG tablet   Oral   Take 125 mcg by mouth daily.           [name] MICARDIS 40 MG tablet   Oral   Take 1 tablet (40 mg total) by mouth daily.   90 tablet   3     Dispense as written.   . simvastatin (ZOCOR) 20 MG tablet   Oral   Take 1 tablet (20 mg total) by mouth every evening.   90 tablet   3   . albuterol (VENTOLIN HFA) 108 (90 BASE) MCG/ACT inhaler   Inhalation   Inhale 2 puffs into the lungs every 6 (six) hours as needed. For shortness of breath           BP 115/88  Pulse 55  Temp(Src) 97.8 F (36.6 C) (Oral)  Resp 14  SpO2 98%  Physical Exam  Constitutional: She is oriented to person, place, and time. She appears well-developed and well-nourished. No distress.  HENT:  Head: Normocephalic and atraumatic.  Mouth/Throat: Oropharynx is clear and moist. No oropharyngeal exudate.  Eyes: Conjunctivae and EOM are normal. Pupils are equal, round, and reactive to light. No scleral icterus.  Neck: Normal range of motion. Neck supple. No tracheal deviation present. No thyromegaly present.  Cardiovascular: Normal rate, regular rhythm, normal heart sounds and intact distal pulses.   Pulmonary/Chest: Effort normal and breath sounds normal. No stridor. No respiratory distress. She has no wheezes.  Abdominal: Soft. There is no tenderness.  Musculoskeletal: Normal range of motion. She exhibits no edema and no tenderness.  Neurological: She is alert and oriented to person, place, and time. Coordination normal.  Skin: Skin is warm and dry. No rash noted. She is not diaphoretic. No erythema. No pallor.  Psychiatric: She has a normal mood and affect. Her  behavior is normal.    ED Course  Procedures (including critical care time)  Labs Reviewed  URINALYSIS, ROUTINE W REFLEX MICROSCOPIC - Abnormal; Notable  for the following:    APPearance CLOUDY (*)    Hgb urine dipstick SMALL (*)    All other components within normal limits  POCT I-STAT, CHEM 8 - Abnormal; Notable for the following:    Potassium 3.2 (*)    All other components within normal limits  URINE MICROSCOPIC-ADD ON   Dg Chest 2 View  02/13/2013  *RADIOLOGY REPORT*  Clinical Data: Anxiety.  CHEST - 2 VIEW  Comparison: 10/21/2011  Findings: Small calcified granuloma the right upper lobe. Heart and mediastinal contours are within normal limits.  No focal opacities or effusions.  No acute bony abnormality.  IMPRESSION: No active cardiopulmonary disease.   Original Report Authenticated By: Charlett Nose, M.D.      No diagnosis found.    MDM  Patient presents to the emergency department complaining of symptoms consistent with anxiety. She is CP free and in NAD throughout hospital stay. Patient has a history of same with similar episodes.  The patient is resting comfortably, in no apparent distress and asymptomatic.  Labs, ECG and vital signs reviewed.  No exophthalmos,  no signs of UTI.  Stress reducing mechanisms discussed including caffeine intake.  Patient is currently closely followed by her PCP, oncologist, kidney specialist and dermatologist. She appears reliable for follow up. Strict return precautions discussed.          Jaci Carrel, [place] 02/13/13 2202

## 2013-02-13 NOTE — ED Notes (Addendum)
Fighting MGUS since 2009.  Sees Dr. Darnelle Catalan for Melanoma R eye, thyroid cancer, nose cancer twice,  Pre- cancer in her colon. States its trying to go into her bones.  Had PET scan in Dec 31.  Had bx. at [facility] 11/15 pelvis and they said it was down there-" it was hanging out."  DR. Voorhees ordered the test.  She went to see her brother in [place] 2 weeks ago and overdid it cooking and cleaning.  She planted [place] plants in her yard last week.  Started feeling bad.  C/o panic attacks, and feels her heart racing, ear feel like there full of fluid. Eyes and throat are scratchy.  C/o BP going up.  None of her doctors can see her.  Tried Dr. Darrick Penna,  Dr. Delford Field, Dr. Catha Gosselin.  She waited in office to see Dr. Gweneth Dimitri for 1 hr 25 min.  She went home, took a Tylenol and a xanax and took a nap.  Phone rang and she thought her ear was going burst. C/o ears aching.

## 2013-02-13 NOTE — ED Notes (Signed)
Pt. Spilled cup of water on herself.  Pt.'s gown changed, paper torn and given 2 warm blankets. Pt. Requested a mask to go to the ED, " because Dr. Darnelle Catalan told her not to get in a crowd because she is immune compromised.

## 2013-02-13 NOTE — ED Notes (Signed)
Pt placed on cardiac monitor; rate = 63 bpm

## 2013-02-13 NOTE — ED Provider Notes (Signed)
History     CSN: [id number]  Arrival date & time 02/13/13  1649   First MD Initiated Contact with Patient 02/13/13 1740      Chief Complaint  Patient presents with  . Melanoma    (Consider location/radiation/quality/duration/timing/severity/associated sxs/prior treatment) Patient is a 72 y.o. female presenting with palpitations. The history is provided by the patient.  Palpitations  This is a recurrent problem. The current episode started 6 to 12 hours ago. The problem has not changed since onset.The problem is associated with anxiety and stress. Associated symptoms include weakness. Pertinent negatives include no chest pain, no cough and no shortness of breath. Risk factors include stress.    Past Medical History  Diagnosis Date  . 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 History of blood clots     LEG  . Stroke   . Hernia   . MGUS (monoclonal gammopathy of unknown significance)   . Chronic kidney disease   . Hypertension   . Hyperlipidemia   . Thyroid disease   . Arthritis   . Mitral valve prolapse   . Anxiety   . Perforated bowel   . Peritonitis   . Diverticulosis     Pt reported on 03/15/12  . MGUS (monoclonal gammopathy of unknown significance)   . Cancer     THYROID, SKIN, NOSE, BONE  . Melanoma   . Melanoma 1989    chest    Past Surgical History  Procedure Laterality Date  . Abdominal hysterectomy    . Rotator cuff repair  2004    RIGHT SHOULDER  . Hernia repair    . Leg surgery      VEIN & BLOOD CLOT  . Cardiovascular stress test  2006    NORMAL  . Transthoracic echocardiogram  2006  . Melanoma excision      R eye  . Skin cancer excision  2011, 2013    squamous cell of nose x 2  . Breast lumpectomy  12/2010    R breast  . Breast lumpectomy  2004    L axilla neg for cancer.    Family History  Problem Relation Age of Onset  . Stroke Mother   . Asthma Father   . Diabetes Father   . Cancer Father   . Heart failure Father   . Cancer Sister    . Diabetes Sister   . Cancer Brother     History  Substance Use Topics  . Smoking status: Never Smoker   . Smokeless tobacco: Never Used  . Alcohol Use: No    OB History   Grav Para Term Preterm Abortions TAB SAB Ect Mult Living                  Review of Systems  Respiratory: Negative for cough, shortness of breath and wheezing.   Cardiovascular: Positive for palpitations. Negative for chest pain.  Neurological: Positive for weakness.  Psychiatric/Behavioral: The patient is nervous/anxious.     Allergies  Aldactone; Hydrocodone; Levofloxacin; Quinolones; Sertraline hcl; Sudafed; and Ultram  Home Medications   Current Outpatient Rx  Name  Route  Sig  Dispense  Refill  . amLODipine (NORVASC) 5 MG tablet   Oral   Take 1 tablet (5 mg total) by mouth daily.   90 tablet   3   . aspirin 81 MG tablet   Oral   Take 81 mg by mouth daily.           [name]  calcium citrate-vitamin D (CITRACAL+D) 315-200 MG-UNIT per tablet   Oral   Take 1 tablet by mouth 2 (two) times daily.          . chlorpheniramine (CHLOR-TRIMETON) 4 MG tablet   Oral   Take 4 mg by mouth 2 (two) times daily as needed for allergies.         . Cholecalciferol (VITAMIN D PO)   Oral   Take by mouth. Vitamin D3 1000 mg once a day by mouth         . clonazePAM (KLONOPIN) 0.5 MG tablet   Oral   Take 0.5 mg by mouth at bedtime as needed.          . fexofenadine (ALLEGRA) 180 MG tablet   Oral   Take 90 mg by mouth daily.         . hydrochlorothiazide (HYDRODIURIL) 25 MG tablet      1/2 daily or as directed   90 tablet   3   . levothyroxine (SYNTHROID, LEVOTHROID) 125 MCG tablet   Oral   Take 125 mcg by mouth daily.           [name] MICARDIS 40 MG tablet   Oral   Take 1 tablet (40 mg total) by mouth daily.   90 tablet   3     Dispense as written.   . simvastatin (ZOCOR) 20 MG tablet   Oral   Take 1 tablet (20 mg total) by mouth every evening.   90 tablet   3   . acetaminophen  (TYLENOL) 650 MG CR tablet      500 mg. Per bottle         . albuterol (VENTOLIN HFA) 108 (90 BASE) MCG/ACT inhaler   Inhalation   Inhale 2 puffs into the lungs every 6 (six) hours as needed. For shortness of breath         . amoxicillin-clavulanate (AUGMENTIN) 875-125 MG per tablet   Oral   Take 1 tablet by mouth 2 (two) times daily.   14 tablet   0   . amoxicillin-clavulanate (AUGMENTIN) 875-125 MG per tablet   Oral   Take 1 tablet by mouth 2 (two) times daily. For 10 days.   20 tablet   0   . fluticasone (FLONASE) 50 MCG/ACT nasal spray   Nasal   Place 2 sprays into the nose daily.   16 g   6   . mometasone (ASMANEX 60 METERED DOSES) 220 MCG/INH inhaler   Inhalation   Inhale 2 puffs into the lungs daily.   1 Inhaler   6   . traMADol (ULTRAM) 50 MG tablet   Oral   Take 50 mg by mouth every 6 (six) hours as needed.           BP 139/81  Pulse 62  Temp(Src) 97.6 F (36.4 C) (Oral)  Resp 12  SpO2 98%  Physical Exam  Nursing note and vitals reviewed. Constitutional: She is oriented to person, place, and time. She appears well-developed and well-nourished.  HENT:  Head: Normocephalic.  Neck: Normal range of motion. Neck supple.  Cardiovascular: Normal rate, regular rhythm, normal heart sounds and intact distal pulses.   Pulmonary/Chest: Effort normal and breath sounds normal.  Abdominal: Soft. Bowel sounds are normal. There is no tenderness.  Musculoskeletal: She exhibits no edema.  Neurological: She is alert and oriented to person, place, and time.  Skin: Skin is warm and dry.    ED Course  Procedures (  including critical care time)  Labs Reviewed - No data to display No results found.   No diagnosis found.    MDM  Sent for eval of multiple med problems.        Linna Hoff, MD 02/13/13 [phone]

## 2013-02-13 NOTE — ED Notes (Signed)
Pt arrived by [facility] from [facility]. Pt c/o anxiety and ear pressure, stated that it feels like her ears are going to burst. Pt also has anxiety about bee problem at her home and stated that she can hear them swarming.

## 2013-02-16 DIAGNOSIS — Z85828 Personal history of other malignant neoplasm of skin: Secondary | ICD-10-CM | POA: Diagnosis not present

## 2013-02-16 DIAGNOSIS — Z85850 Personal history of malignant neoplasm of thyroid: Secondary | ICD-10-CM | POA: Diagnosis not present

## 2013-02-16 DIAGNOSIS — E039 Hypothyroidism, unspecified: Secondary | ICD-10-CM | POA: Diagnosis not present

## 2013-02-16 DIAGNOSIS — Z79899 Other long term (current) drug therapy: Secondary | ICD-10-CM | POA: Diagnosis not present

## 2013-02-16 DIAGNOSIS — F411 Generalized anxiety disorder: Secondary | ICD-10-CM | POA: Diagnosis not present

## 2013-02-16 DIAGNOSIS — C9000 Multiple myeloma not having achieved remission: Secondary | ICD-10-CM | POA: Diagnosis not present

## 2013-02-16 DIAGNOSIS — E7800 Pure hypercholesterolemia, unspecified: Secondary | ICD-10-CM | POA: Diagnosis not present

## 2013-02-16 DIAGNOSIS — Z8673 Personal history of transient ischemic attack (TIA), and cerebral infarction without residual deficits: Secondary | ICD-10-CM | POA: Diagnosis not present

## 2013-02-16 DIAGNOSIS — Z85820 Personal history of malignant melanoma of skin: Secondary | ICD-10-CM | POA: Diagnosis not present

## 2013-02-16 DIAGNOSIS — D472 Monoclonal gammopathy: Secondary | ICD-10-CM | POA: Diagnosis not present

## 2013-02-16 DIAGNOSIS — K219 Gastro-esophageal reflux disease without esophagitis: Secondary | ICD-10-CM | POA: Diagnosis not present

## 2013-02-16 DIAGNOSIS — J45909 Unspecified asthma, uncomplicated: Secondary | ICD-10-CM | POA: Diagnosis not present

## 2013-02-16 DIAGNOSIS — I10 Essential (primary) hypertension: Secondary | ICD-10-CM | POA: Diagnosis not present

## 2013-02-19 DIAGNOSIS — N6019 Diffuse cystic mastopathy of unspecified breast: Secondary | ICD-10-CM | POA: Insufficient documentation

## 2013-02-19 DIAGNOSIS — J45909 Unspecified asthma, uncomplicated: Secondary | ICD-10-CM | POA: Insufficient documentation

## 2013-02-19 DIAGNOSIS — R319 Hematuria, unspecified: Secondary | ICD-10-CM | POA: Insufficient documentation

## 2013-02-19 DIAGNOSIS — K219 Gastro-esophageal reflux disease without esophagitis: Secondary | ICD-10-CM | POA: Insufficient documentation

## 2013-02-19 DIAGNOSIS — I639 Cerebral infarction, unspecified: Secondary | ICD-10-CM | POA: Insufficient documentation

## 2013-02-19 DIAGNOSIS — D126 Benign neoplasm of colon, unspecified: Secondary | ICD-10-CM | POA: Insufficient documentation

## 2013-02-19 DIAGNOSIS — I341 Nonrheumatic mitral (valve) prolapse: Secondary | ICD-10-CM | POA: Insufficient documentation

## 2013-03-01 ENCOUNTER — Encounter: Payer: Self-pay | Admitting: Critical Care Medicine

## 2013-03-01 ENCOUNTER — Ambulatory Visit (INDEPENDENT_AMBULATORY_CARE_PROVIDER_SITE_OTHER): Payer: Medicare Other | Admitting: Critical Care Medicine

## 2013-03-01 VITALS — BP 110/66 | HR 79 | Temp 98.0°F | Ht 61.0 in | Wt 122.0 lb

## 2013-03-01 DIAGNOSIS — J45909 Unspecified asthma, uncomplicated: Secondary | ICD-10-CM | POA: Diagnosis not present

## 2013-03-01 MED ORDER — METHYLPREDNISOLONE ACETATE 80 MG/ML IJ SUSP
120.0000 mg | Freq: Once | INTRAMUSCULAR | Status: AC
  Administered 2013-03-01: 120 mg via INTRAMUSCULAR

## 2013-03-01 MED ORDER — MOMETASONE FUROATE 50 MCG/ACT NA SUSP: 2.0000 | Freq: Every day | NASAL | Status: DC

## 2013-03-01 MED ORDER — MOMETASONE FUROATE 220 MCG/INH IN AEPB: 2.0000 | INHALATION_SPRAY | Freq: Every day | RESPIRATORY_TRACT | Status: DC

## 2013-03-01 MED ORDER — AMOXICILLIN-POT CLAVULANATE 875-125 MG PO TABS: 1.0000 | ORAL_TABLET | Freq: Two times a day (BID) | ORAL | Status: DC

## 2013-03-01 NOTE — Patient Instructions (Addendum)
When veramyst runs out start nasonex two puff ea nostril daily Stay on asmanex but increase to two puff daily  A depomedrol 120mg  injection was given Take augmentin one twice daily for 10days Use albuterol as needed Return 6 weeks

## 2013-03-01 NOTE — Progress Notes (Signed)
Subjective:    Patient ID: [name], female    DOB: 08-03-1941, 72 y.o.   MRN: [id number]  HPI  72 y.o. WF  never smoker with  H/o asthma/ allergies to dust mold  and referred by Dr Clarene Duke 10/21/2011 for pulmonary evaluation for difficult to control asthma     03/01/2013 Pt oted three weeks ago, tongue irritated, dyspnea and nasal swelling /facial pain on L.  Pt went to PCP and not seen.  Pt went to Urgent Care: 2weeks ago>>>ED visit only MGUS rates up to 3000, followed at [facility] Pt in ED 02/15/13: No wheezing, notes more dyspnea, notes thick mucus, hard to raise.  No chest pain.  Mucus is gray.  No real fevere Ears hurt, throat is sore    PUL ASTHMA HISTORY 03/01/2013 09/19/2012  Symptoms Daily Daily  Nighttime awakenings 0-2/month 0-2/month  Interference with activity Some limitations Some limitations  SABA use 0-2 days/wk > 2 days/wk--not > 1 x/day  Exacerbations requiring oral steroids 0-1 / year 0-1 / year    Past Medical History  Diagnosis Date  . 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 History of blood clots     LEG  . Stroke   . Hernia   . MGUS (monoclonal gammopathy of unknown significance)   . Chronic kidney disease   . Hypertension   . Hyperlipidemia   . Thyroid disease   . Arthritis   . Mitral valve prolapse   . Anxiety   . Perforated bowel   . Peritonitis   . Diverticulosis     Pt reported on 03/15/12  . MGUS (monoclonal gammopathy of unknown significance)   . Cancer     THYROID, SKIN, NOSE, BONE  . Melanoma   . Melanoma 1989    chest     Family History  Problem Relation Age of Onset  . Stroke Mother   . Asthma Father   . Diabetes Father   . Cancer Father   . Heart failure Father   . Cancer Sister   . Diabetes Sister   . Cancer Brother      History   Social History  . Marital Status: Divorced    Spouse Name: N/A    Number of Children: 2  . Years of Education: N/A   Occupational History  . Retired     Holiday representative    Social History Main Topics  . Smoking status: Never Smoker   . Smokeless tobacco: Never Used  . Alcohol Use: No  . Drug Use: No  . Sexually Active: Not on file   Other Topics Concern  . Not on file   Social History Narrative  . No narrative on file     Allergies  Allergen Reactions  . Aldactone (Spironolactone)     Burning in stomach  . Hydrocodone   . Levofloxacin   . Quinolones   . Sertraline Hcl   . Sudafed (Pseudoephedrine Hcl)   . Ultram (Tramadol)     Headaches      Outpatient Prescriptions Prior to Visit  Medication Sig Dispense Refill  . albuterol (VENTOLIN HFA) 108 (90 BASE) MCG/ACT inhaler Inhale 2 puffs into the lungs every 6 (six) hours as needed. For shortness of breath      . amLODipine (NORVASC) 5 MG tablet Take 1 tablet (5 mg total) by mouth daily.  90 tablet  3  . aspirin 81 MG tablet Take 81 mg by mouth daily.        [name]  calcium citrate-vitamin D (CITRACAL+D) 315-200 MG-UNIT per tablet Take 1 tablet by mouth 2 (two) times daily.       . Cholecalciferol (VITAMIN D PO) Take by mouth. Vitamin D3 1000 mg once a day by mouth      . clonazePAM (KLONOPIN) 0.5 MG tablet Take 0.5 mg by mouth at bedtime as needed for anxiety.       . fexofenadine (ALLEGRA) 180 MG tablet Take 180 mg by mouth daily.       . hydrochlorothiazide (HYDRODIURIL) 25 MG tablet Take 12.5 mg by mouth daily. Takes 1/2 tablet daily or as directed      . levothyroxine (SYNTHROID, LEVOTHROID) 125 MCG tablet Take 125 mcg by mouth daily.        [name] MICARDIS 40 MG tablet Take 1 tablet (40 mg total) by mouth daily.  90 tablet  3  . simvastatin (ZOCOR) 20 MG tablet Take 1 tablet (20 mg total) by mouth every evening.  90 tablet  3  . fluticasone (FLONASE) 50 MCG/ACT nasal spray Place 2 sprays into the nose daily.  16 g  6   No facility-administered medications prior to visit.       Review of Systems Constitutional:   No  weight loss, night sweats,  Fevers, chills, fatigue, lassitude. HEENT:   No  headaches,  Difficulty swallowing,  Tooth/dental problems,  +++Sore throat,                No sneezing, itching, ear ache,+++ nasal congestion, +++post nasal drip,   CV:  No chest pain,  Orthopnea, PND, swelling in lower extremities, anasarca, dizziness, palpitations  GI  No heartburn, indigestion, abdominal pain, nausea, vomiting, diarrhea, change in bowel habits, loss of appetite  Resp: Notes shortness of breath with exertion not at rest.  No wheezing.  No chest wall deformity  Skin: no rash or lesions.  GU: no dysuria, change in color of urine, no urgency or frequency.  No flank pain.  MS:  No joint pain or swelling.  No decreased range of motion.  No back pain.  Psych:  No change in mood or affect. No depression or anxiety.  No memory loss.     Objective:   Physical Exam  Filed Vitals:   03/01/13 1147  BP: 110/66  Pulse: 79  Temp: 98 F (36.7 C)  TempSrc: Oral  Height: 5\' 1"  (1.549 m)  Weight: 122 lb (55.339 kg)  SpO2: 97%    Gen: Pleasant, well-nourished, in no distress,  normal affect  ENT: No lesions,  mouth clear,  oropharynx clear, right greater than left nasal purulence  Neck: No JVD, no TMG, no carotid bruits  Lungs: No use of accessory muscles, no dullness to percussion, distant breath sounds  Cardiovascular: RRR, heart sounds normal, no murmur or gallops, no peripheral edema  Abdomen: soft and NT, no HSM,  BS normal  Musculoskeletal: No deformities, no cyanosis or clubbing  Neuro: alert, non focal  Skin: Warm, no lesions or rashes     Assessment & Plan:   Moderate persistent asthma with primary cough variation Moderate persistent asthma with cyclical cough Plan Stay on asmanex but increase to two puff daily  A depomedrol 120mg  injection was given Take augmentin one twice daily for 10days Use albuterol as needed Return 6 weeks     Updated Medication List Outpatient Encounter Prescriptions as of 03/01/2013  Medication Sig Dispense Refill   . albuterol (VENTOLIN HFA) 108 (90 BASE) MCG/ACT inhaler Inhale 2 puffs into  the lungs every 6 (six) hours as needed. For shortness of breath      . amLODipine (NORVASC) 5 MG tablet Take 1 tablet (5 mg total) by mouth daily.  90 tablet  3  . aspirin 81 MG tablet Take 81 mg by mouth daily.        . calcium citrate-vitamin D (CITRACAL+D) 315-200 MG-UNIT per tablet Take 1 tablet by mouth 2 (two) times daily.       . Cholecalciferol (VITAMIN D PO) Take by mouth. Vitamin D3 1000 mg once a day by mouth      . clonazePAM (KLONOPIN) 0.5 MG tablet Take 0.5 mg by mouth at bedtime as needed for anxiety.       . fexofenadine (ALLEGRA) 180 MG tablet Take 180 mg by mouth daily.       . hydrochlorothiazide (HYDRODIURIL) 25 MG tablet Take 12.5 mg by mouth daily. Takes 1/2 tablet daily or as directed      . levothyroxine (SYNTHROID, LEVOTHROID) 125 MCG tablet Take 125 mcg by mouth daily.        [name] MICARDIS 40 MG tablet Take 1 tablet (40 mg total) by mouth daily.  90 tablet  3  . mometasone (ASMANEX) 220 MCG/INH inhaler Inhale 2 puffs into the lungs daily.  1 Inhaler  6  . simvastatin (ZOCOR) 20 MG tablet Take 1 tablet (20 mg total) by mouth every evening.  90 tablet  3  . [DISCONTINUED] fluticasone (VERAMYST) 27.5 MCG/SPRAY nasal spray Place 2 sprays into the nose daily.      . [DISCONTINUED] mometasone (ASMANEX) 220 MCG/INH inhaler Inhale 1 puff into the lungs daily.      [name] amoxicillin-clavulanate (AUGMENTIN) 875-125 MG per tablet Take 1 tablet by mouth 2 (two) times daily.  20 tablet  0  . mometasone (NASONEX) 50 MCG/ACT nasal spray Place 2 sprays into the nose daily.  7 g  1  . [DISCONTINUED] fluticasone (FLONASE) 50 MCG/ACT nasal spray Place 2 sprays into the nose daily.  16 g  6  . [EXPIRED] methylPREDNISolone acetate (DEPO-MEDROL) injection 120 mg        No facility-administered encounter medications on file as of 03/01/2013.

## 2013-03-02 ENCOUNTER — Ambulatory Visit: Payer: Medicare Other | Admitting: Critical Care Medicine

## 2013-03-02 NOTE — Assessment & Plan Note (Signed)
Moderate persistent asthma with cyclical cough Plan Stay on asmanex but increase to two puff daily  A depomedrol 120mg  injection was given Take augmentin one twice daily for 10days Use albuterol as needed Return 6 weeks

## 2013-03-08 ENCOUNTER — Other Ambulatory Visit: Payer: Medicare Other

## 2013-03-08 ENCOUNTER — Ambulatory Visit: Payer: Medicare Other | Admitting: Cardiology

## 2013-03-12 DIAGNOSIS — D1801 Hemangioma of skin and subcutaneous tissue: Secondary | ICD-10-CM | POA: Diagnosis not present

## 2013-03-12 DIAGNOSIS — Z85828 Personal history of other malignant neoplasm of skin: Secondary | ICD-10-CM | POA: Diagnosis not present

## 2013-03-12 DIAGNOSIS — Z85820 Personal history of malignant melanoma of skin: Secondary | ICD-10-CM | POA: Diagnosis not present

## 2013-03-12 DIAGNOSIS — D485 Neoplasm of uncertain behavior of skin: Secondary | ICD-10-CM | POA: Diagnosis not present

## 2013-03-12 DIAGNOSIS — L821 Other seborrheic keratosis: Secondary | ICD-10-CM | POA: Diagnosis not present

## 2013-03-15 ENCOUNTER — Telehealth: Payer: Self-pay | Admitting: Critical Care Medicine

## 2013-03-15 NOTE — Telephone Encounter (Signed)
I spoke with pt. She c/o facial and bilateral ear pressure, left side face feels swollen, slight cough w/ slight white phlem, hoarseness x yesterday getting worse. No PND, no wheezing, no chest tx. She is doing sinus rinse. She stated she had several funerals to go to and not sure what's going. She is requesting to have something called in to help with relief. Please advise Dr. Maple Hudson as PW is off. Thanks Last OV 03/01/13 Pending 04/10/13 Allergies  Allergen Reactions  . Aldactone (Spironolactone)     Burning in stomach  . Hydrocodone   . Levofloxacin   . Quinolones   . Sertraline Hcl   . Sudafed (Pseudoephedrine Hcl)   . Ultram (Tramadol)     Headaches

## 2013-03-15 NOTE — Telephone Encounter (Signed)
Per CY-lets give her a sample of Dymista 1-2 sprays in each nostril QHS. Thanks.

## 2013-03-15 NOTE — Telephone Encounter (Signed)
I spoke with pt and notified of recs per CDY She states that after she took sudafed before, she suffered from a stroke about 30 min after taking med I explained that sudafed PE is a different med and will not affect her BP She still refuses to take  Already doing sinus rinses and taking nasonex Please advise thanks Allergies  Allergen Reactions  . Aldactone (Spironolactone)     Burning in stomach  . Hydrocodone   . Levofloxacin   . Quinolones   . Sertraline Hcl   . Sudafed (Pseudoephedrine Hcl)   . Ultram (Tramadol)     Headaches

## 2013-03-15 NOTE — Telephone Encounter (Signed)
Per CY-why can't she take sudafed? Could she try OTC Sudafed PE? (different drug) as a decongestant.

## 2013-03-15 NOTE — Telephone Encounter (Signed)
Pt is aware of CY recommendation. I will leave a sample up front her to pick up. Nothing further was needed.

## 2013-03-16 ENCOUNTER — Telehealth: Payer: Self-pay | Admitting: Critical Care Medicine

## 2013-03-16 NOTE — Telephone Encounter (Signed)
[id number] x1 

## 2013-03-16 NOTE — Telephone Encounter (Signed)
Spoke with patient, patient requesting to come in and be seen- still not feeling well Request to be seen only by Dr. Delford Field Patient has been scheduled for Mon Jun 16 @ 215pm Nothing further needed at this time

## 2013-03-16 NOTE — Telephone Encounter (Signed)
Returning call can be reached at [phone].[name]

## 2013-03-19 ENCOUNTER — Encounter: Payer: Self-pay | Admitting: Critical Care Medicine

## 2013-03-19 ENCOUNTER — Ambulatory Visit (INDEPENDENT_AMBULATORY_CARE_PROVIDER_SITE_OTHER): Payer: Medicare Other | Admitting: Critical Care Medicine

## 2013-03-19 VITALS — BP 120/72 | HR 62 | Temp 97.1°F | Ht 61.0 in | Wt 123.0 lb

## 2013-03-19 DIAGNOSIS — J45909 Unspecified asthma, uncomplicated: Secondary | ICD-10-CM

## 2013-03-19 NOTE — Patient Instructions (Addendum)
Stay on Dymista one puff twice daily Use albuterol as needed No further antibiotics or steroids Stay on asmanex Follow a reflux diet Return 2 months

## 2013-03-19 NOTE — Progress Notes (Signed)
Subjective:    Patient ID: [name], female    DOB: 1940/12/24, 73 y.o.   MRN: [id number]  HPI  72 y.o. WF  never smoker with  H/o asthma/ allergies to dust mold  and referred by Dr Clarene [facility] 10/21/2011 for pulmonary evaluation for difficult to control asthma     03/01/2013 Pt oted three weeks ago, tongue irritated, dyspnea and nasal swelling /facial pain on L.  Pt went to PCP and not seen.  Pt went to Urgent Care: 2weeks ago>>>ED visit only MGUS rates up to 3000, followed at [facility] Pt in ED 02/15/13: No wheezing, notes more dyspnea, notes thick mucus, hard to raise.  No chest pain.  Mucus is gray.  No real fevere Ears hurt, throat is sore  03/19/2013 Pt unimproved from prior OV two weeks ago We rx then: Stay on asmanex but increase to two puff daily  A depomedrol 120mg  injection was given Take augmentin one twice daily for 10days Use albuterol as needed  Pt worse over the past week. Chest still hurts. Ears still full ? Fluid in ear.   Did not use pseudophed.   Pt using 1-2 puff dymista sample Off all ABX. No real cough, min mucus.  Pt still dyspneic. Noted low grade temps Pt notes more indigestion and burping PUL ASTHMA HISTORY 03/01/2013 09/19/2012  Symptoms Daily Daily  Nighttime awakenings 0-2/month 0-2/month  Interference with activity Some limitations Some limitations  SABA use 0-2 days/wk > 2 days/wk--not > 1 x/day  Exacerbations requiring oral steroids 0-1 / year 0-1 / year    Past Medical History  Diagnosis Date  . 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 History of blood clots     LEG  . Stroke   . Hernia   . MGUS (monoclonal gammopathy of unknown significance)   . Chronic kidney disease   . Hypertension   . Hyperlipidemia   . Thyroid disease   . Arthritis   . Mitral valve prolapse   . Anxiety   . Perforated bowel   . Peritonitis   . Diverticulosis     Pt reported on 03/15/12  . MGUS (monoclonal gammopathy of unknown significance)   .  Cancer     THYROID, SKIN, NOSE, BONE  . Melanoma   . Melanoma 1989    chest     Family History  Problem Relation Age of Onset  . Stroke Mother   . Asthma Father   . Diabetes Father   . Cancer Father   . Heart failure Father   . Cancer Sister   . Diabetes Sister   . Cancer Brother      History   Social History  . Marital Status: Divorced    Spouse Name: N/A    Number of Children: 2  . Years of Education: N/A   Occupational History  . Retired     Holiday representative   Social History Main Topics  . Smoking status: Never Smoker   . Smokeless tobacco: Never Used  . Alcohol Use: No  . Drug Use: No  . Sexually Active: Not on file   Other Topics Concern  . Not on file   Social History Narrative  . No narrative on file     Allergies  Allergen Reactions  . Aldactone (Spironolactone)     Burning in stomach  . Hydrocodone   . Levofloxacin   . Quinolones   . Sertraline Hcl   . Sudafed (Pseudoephedrine Hcl)   .  Ultram (Tramadol)     Headaches      Outpatient Prescriptions Prior to Visit  Medication Sig Dispense Refill  . albuterol (VENTOLIN HFA) 108 (90 BASE) MCG/ACT inhaler Inhale 2 puffs into the lungs every 6 (six) hours as needed. For shortness of breath      . amLODipine (NORVASC) 5 MG tablet Take 1 tablet (5 mg total) by mouth daily.  90 tablet  3  . aspirin 81 MG tablet Take 81 mg by mouth daily.        . calcium citrate-vitamin D (CITRACAL+D) 315-200 MG-UNIT per tablet Take 1 tablet by mouth 2 (two) times daily.       . Cholecalciferol (VITAMIN D PO) Take by mouth. Vitamin D3 1000 mg once a day by mouth      . clonazePAM (KLONOPIN) 0.5 MG tablet Take 0.5 mg by mouth at bedtime as needed for anxiety.       . fexofenadine (ALLEGRA) 180 MG tablet Take 180 mg by mouth daily.       . hydrochlorothiazide (HYDRODIURIL) 25 MG tablet Take 12.5 mg by mouth daily. Takes 1/2 tablet daily or as directed      . levothyroxine (SYNTHROID, LEVOTHROID) 125 MCG tablet Take 125 mcg  by mouth daily.        [name] MICARDIS 40 MG tablet Take 1 tablet (40 mg total) by mouth daily.  90 tablet  3  . mometasone (ASMANEX) 220 MCG/INH inhaler Inhale 2 puffs into the lungs daily.  1 Inhaler  6  . simvastatin (ZOCOR) 20 MG tablet Take 1 tablet (20 mg total) by mouth every evening.  90 tablet  3  . mometasone (NASONEX) 50 MCG/ACT nasal spray Place 2 sprays into the nose daily.  7 g  1  . amoxicillin-clavulanate (AUGMENTIN) 875-125 MG per tablet Take 1 tablet by mouth 2 (two) times daily.  20 tablet  0   No facility-administered medications prior to visit.       Review of Systems Constitutional:   No  weight loss, night sweats,  Fevers, chills, fatigue, lassitude. HEENT:   No headaches,  Difficulty swallowing,  Tooth/dental problems,  +++Sore throat,                No sneezing, itching, ear ache,+++ nasal congestion, +++post nasal drip,   CV:  No chest pain,  Orthopnea, PND, swelling in lower extremities, anasarca, dizziness, palpitations  GI  No heartburn, indigestion, abdominal pain, nausea, vomiting, diarrhea, change in bowel habits, loss of appetite  Resp: Notes shortness of breath with exertion not at rest.  No wheezing.  No chest wall deformity  Skin: no rash or lesions.  GU: no dysuria, change in color of urine, no urgency or frequency.  No flank pain.  MS:  No joint pain or swelling.  No decreased range of motion.  No back pain.  Psych:  No change in mood or affect. No depression or anxiety.  No memory loss.     Objective:   Physical Exam  Filed Vitals:   03/19/13 1431  BP: 120/72  Pulse: 62  Temp: 97.1 F (36.2 C)  TempSrc: Oral  Height: 5\' 1"  (1.549 m)  Weight: 123 lb (55.792 kg)  SpO2: 97%    Gen: Pleasant, well-nourished, in no distress,  normal affect  ENT: No lesions,  mouth clear,  oropharynx clear,no purulence in nares  Neck: No JVD, no TMG, no carotid bruits  Lungs: No use of accessory muscles, no dullness to percussion, distant  breath  sounds  Cardiovascular: RRR, heart sounds normal, no murmur or gallops, no peripheral edema  Abdomen: soft and NT, no HSM,  BS normal  Musculoskeletal: No deformities, no cyanosis or clubbing  Neuro: alert, non focal  Skin: Warm, no lesions or rashes     Assessment & Plan:   Moderate persistent asthma with primary cough variation Moderate persistent asthma improved with recent sinusitis flare Plan Stay on Dymista one puff twice daily Use albuterol as needed No further antibiotics or steroids Stay on asmanex Follow a reflux diet Return 2 months     Updated Medication List Outpatient Encounter Prescriptions as of 03/19/2013  Medication Sig Dispense Refill  . albuterol (VENTOLIN HFA) 108 (90 BASE) MCG/ACT inhaler Inhale 2 puffs into the lungs every 6 (six) hours as needed. For shortness of breath      . amLODipine (NORVASC) 5 MG tablet Take 1 tablet (5 mg total) by mouth daily.  90 tablet  3  . aspirin 81 MG tablet Take 81 mg by mouth daily.        . Azelastine-Fluticasone (DYMISTA) 137-50 MCG/ACT SUSP Place 1 puff into the nose daily.      . calcium citrate-vitamin D (CITRACAL+D) 315-200 MG-UNIT per tablet Take 1 tablet by mouth 2 (two) times daily.       . Cholecalciferol (VITAMIN D PO) Take by mouth. Vitamin D3 1000 mg once a day by mouth      . clonazePAM (KLONOPIN) 0.5 MG tablet Take 0.5 mg by mouth at bedtime as needed for anxiety.       . fexofenadine (ALLEGRA) 180 MG tablet Take 180 mg by mouth daily.       . hydrochlorothiazide (HYDRODIURIL) 25 MG tablet Take 12.5 mg by mouth daily. Takes 1/2 tablet daily or as directed      . levothyroxine (SYNTHROID, LEVOTHROID) 125 MCG tablet Take 125 mcg by mouth daily.        [name] MICARDIS 40 MG tablet Take 1 tablet (40 mg total) by mouth daily.  90 tablet  3  . mometasone (ASMANEX) 220 MCG/INH inhaler Inhale 2 puffs into the lungs daily.  1 Inhaler  6  . simvastatin (ZOCOR) 20 MG tablet Take 1 tablet (20 mg total) by mouth every  evening.  90 tablet  3  . mometasone (NASONEX) 50 MCG/ACT nasal spray Place 2 sprays into the nose daily.  7 g  1  . [DISCONTINUED] amoxicillin-clavulanate (AUGMENTIN) 875-125 MG per tablet Take 1 tablet by mouth 2 (two) times daily.  20 tablet  0   No facility-administered encounter medications on file as of 03/19/2013.

## 2013-03-20 NOTE — Assessment & Plan Note (Signed)
Moderate persistent asthma improved with recent sinusitis flare Plan Stay on Dymista one puff twice daily Use albuterol as needed No further antibiotics or steroids Stay on asmanex Follow a reflux diet Return 2 months

## 2013-03-26 ENCOUNTER — Other Ambulatory Visit (HOSPITAL_BASED_OUTPATIENT_CLINIC_OR_DEPARTMENT_OTHER): Payer: Medicare Other | Admitting: Lab

## 2013-03-26 DIAGNOSIS — D472 Monoclonal gammopathy: Secondary | ICD-10-CM

## 2013-03-26 LAB — COMPREHENSIVE METABOLIC PANEL (CC13)
Albumin: 3.7 g/dL (ref 3.5–5.0)
Alkaline Phosphatase: 44 U/L (ref 40–150)
BUN: 18.6 mg/dL (ref 7.0–26.0)
Calcium: 9.6 mg/dL (ref 8.4–10.4)
Chloride: 103 mEq/L (ref 98–107)
Glucose: 79 mg/dl (ref 70–99)
Potassium: 3.8 mEq/L (ref 3.5–5.1)

## 2013-03-26 LAB — CBC WITH DIFFERENTIAL/PLATELET
Basophils Absolute: 0.1 10*3/uL (ref 0.0–0.1)
Eosinophils Absolute: 0.2 10*3/uL (ref 0.0–0.5)
HCT: 36.9 % (ref 34.8–46.6)
HGB: 12.8 g/dL (ref 11.6–15.9)
MCV: 85.4 fL (ref 79.5–101.0)
MONO%: 10.1 % (ref 0.0–14.0)
NEUT#: 3.1 10*3/uL (ref 1.5–6.5)
NEUT%: 51.5 % (ref 38.4–76.8)
RDW: 13 % (ref 11.2–14.5)

## 2013-03-28 DIAGNOSIS — C73 Malignant neoplasm of thyroid gland: Secondary | ICD-10-CM | POA: Diagnosis not present

## 2013-03-28 LAB — PROTEIN ELECTROPHORESIS, SERUM
Albumin ELP: 55.3 % — ABNORMAL LOW (ref 55.8–66.1)
Alpha-1-Globulin: 4 % (ref 2.9–4.9)

## 2013-03-28 LAB — KAPPA/LAMBDA LIGHT CHAINS
Kappa free light chain: 0.16 mg/dL — ABNORMAL LOW (ref 0.33–1.94)
Kappa:Lambda Ratio: 0 — ABNORMAL LOW (ref 0.26–1.65)
Lambda Free Lght Chn: 35.5 mg/dL — ABNORMAL HIGH (ref 0.57–2.63)

## 2013-04-02 ENCOUNTER — Encounter: Payer: Self-pay | Admitting: Family

## 2013-04-02 ENCOUNTER — Ambulatory Visit (HOSPITAL_BASED_OUTPATIENT_CLINIC_OR_DEPARTMENT_OTHER): Payer: Medicare Other | Admitting: Family

## 2013-04-02 ENCOUNTER — Telehealth: Payer: Self-pay | Admitting: *Deleted

## 2013-04-02 VITALS — BP 126/77 | HR 64 | Temp 97.6°F | Resp 20 | Ht 61.0 in | Wt 122.2 lb

## 2013-04-02 DIAGNOSIS — D472 Monoclonal gammopathy: Secondary | ICD-10-CM

## 2013-04-02 NOTE — Progress Notes (Signed)
Results for [name], [name] (MRN [id number]) as of 04/02/2013 09:47  Ref. Range 11/06/2012 10:25 03/26/2013 08:19  M-SPIKE, % No range found 1.62 1.72   Results for [name], [name] (MRN [id number]) as of 04/02/2013 09:47  Ref. Range 02/29/2012 09:52 07/11/2012 09:31 11/06/2012 10:25 01/02/2013 09:16 03/26/2013 08:19  Kappa free light chain Latest Range: 0.33-1.94 mg/dL 7.82 (L) 9.56 2.13 (L) 0.03 (L) 0.16 (L)   Results for [name], [name] (MRN [id number]) as of 04/02/2013 09:47  Ref. Range 02/29/2012 09:52 07/11/2012 09:31 11/06/2012 10:25 01/02/2013 09:16 03/26/2013 08:19  Lambda Free Lght Chn Latest Range: 0.57-2.63 mg/dL 62.95 (H) 28.41 (H) 32.44 (H) 32.10 (H) 35.50 (H)

## 2013-04-02 NOTE — Progress Notes (Addendum)
[facility]  [phone] [phone]  OFFICE PROGRESS NOTE    ID: TAWNI MELKONIAN   DOB: Feb 19, 1941  MR#: [id number]  JYN#:[id number]   PCP: Gweneth Dimitri, MD OTHER MD: Cassell Clement, Raye Sorrow, Shan Levans, Grier Rocher Deterding   HISTORY OF PRESENT ILLNESS: The patient was evaluated by Dr. Darrick Penna for a history of hematuria, previously evaluated by Bjorn Pippin.  There is a history of multiple urinary tract infections and interstitial cystitis, but no history of stones or family history of hematuria.  Dr. Darrick Penna suspects this could be interstitial or papillary related to nonsteroidals and aspirin, but he is concerned that this could be related to one of the patient's cancers.  As part of his workup, he obtained serum protein electrophoresis, which showed an M-spike of 800 mg.  A total immunoglobulin G was 1374, total A was 46, and total M was 39.  Immunofixation showed IgG lambda specificity for the monoclonal component.  With this information, the patient was referred for further evaluation and treatment. Her subsequent history is as detailed below.   INTERVAL HISTORY: Dr. Darnelle Catalan and I saw Ms. Walters today for followup of M-GUS.  Since her last office visit on 01/09/2013, the patient reports that she has been doing fairly well other than family stressors.  The patient has ongoing family issues and has been estranged from her 4 sisters and 2 brothers.  She is in contact with one of her other brothers.  She also states that her physician Dr. Marissa Calamity at [facility] has placed her on a restricted diet with which she has adhered to, but not without difficulty.  Her interval history is significant for completing a bilateral digital diagnostic mammogram on 01/15/2013 that showed no evidence of malignancy.  She also had a chest x-ray completed on 02/13/2013 which shows a small calcified granuloma in the right upper lobe, without evidence of  cardiopulmonary disease.   REVIEW OF SYSTEMS: Ms. Cloe continues to sleep poorly and is subsequently mildly fatigued. She states her lower back/coccyx/sacrum area hurts after sitting in church services for 3 hours.  It was recommended that the patient take a seat cushion to her church services if they continue to be lengthy.  She continues to have vision problems and does not see well at night.  She continues her diet in which she has placed her cells on no sugar/no carbohydrate diet, and has lost a fair amount of weight. She continues to try to control her stress with exercise.  She had a midsternal skin lesion excised approximately 2 weeks ago. She denies any unusual bleeding, fevers, N/V/D, hot flashes, night sweats or vaginal dryness.  She denies any other symptomatology.   A detailed review of systems was otherwise stable.   PAST MEDICAL HISTORY: Past Medical History  Diagnosis Date  . 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 History of blood clots     LEG  . Stroke   . Hernia   . MGUS (monoclonal gammopathy of unknown significance)   . Chronic kidney disease   . Hypertension   . Hyperlipidemia   . Thyroid disease   . Arthritis   . Mitral valve prolapse   . Anxiety   . Perforated bowel   . Peritonitis   . Diverticulosis     Pt reported on 03/15/12  . MGUS (monoclonal gammopathy of unknown significance)   . Cancer     THYROID, SKIN, NOSE, BONE  .  Melanoma   . Melanoma 1989    chest  1. History of right ocular melanoma, status post enucleation and brachytherapy (I do not have the actual records from the treatments, which were performed in [place], I believe in 2005. 2. History of papillary carcinoma of the thyroid, resected at [facility] approximately 3 years ago and followed by radiation to that area (most likely I-131 but again, I do not have those records).  Multiple other medical problems include hypertension, history of left body stroke with mild residuals, history of  superficial thrombophlebitis, history of mitral valve prolapse, history of hyperlipidemia, history of GERD, history of ventral hernia repair x 2, history of hysterectomy with bilateral salpingo-oophorectomy for fibroids at age 2, history of fibrocystic breast changes, history of reactive airway disease, history of removal of a left axillary mass November 09, 2006, which was benign, history of allergic rhinitis, history of depression, history of osteoporosis, history of remote deep vein thrombosis.   PAST SURGICAL HISTORY: Past Surgical History  Procedure Laterality Date  . Abdominal hysterectomy    . Rotator cuff repair  2004    RIGHT SHOULDER  . Hernia repair    . Leg surgery      VEIN & BLOOD CLOT  . Cardiovascular stress test  2006    NORMAL  . Transthoracic echocardiogram  2006  . Melanoma excision      R eye  . Skin cancer excision  2011, 2013    squamous cell of nose x 2  . Breast lumpectomy  12/2010    R breast  . Breast lumpectomy  2004    L axilla neg for cancer.    FAMILY HISTORY Family History  Problem Relation Age of Onset  . Stroke Mother   . Asthma Father   . Diabetes Father   . Cancer Father   . Heart failure Father   . Cancer Sister   . Diabetes Sister   . Cancer Brother   . Cancer Father     Prostate and kidney cancer  . Stroke Mother 80    Her mother passed away from this stroke at the age of 68  . Cervical cancer Sister   . Cancer Sister     Recurrent cancer involving the neck and the jaw  The patient had a brother who committed suicide.  GYNECOLOGIC HISTORY: She is GX P2.  Did not take hormone replacement after her hysterectomy.    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 She is now retired, divorced, and lives by herself. Her son, Anjelina Dung, lives in [place] and works as a Animator.  Her son, Jorja Loa, is a "tree  man", again locally.  The patient has 3 grandchildren, one in [place] and two here in town. She attends [employer]       ADVANCED DIRECTIVES: in place   HEALTH MAINTENANCE: History  Substance Use Topics  . Smoking status: Never Smoker   . Smokeless tobacco: Never Used  . Alcohol Use: No     Colonoscopy: 10/22/2010  PAP: 05/03/2012  Bone density: The last bone density scan we have on record dated 04/13/2004 showed a T score of -1.6 (osteopenia).  Lipid panel: 11/07/2012   Allergies  Allergen Reactions  . Aldactone (Spironolactone)     Burning in stomach Burning in stomach  . Hydrocodone   . Levofloxacin   .  Pseudoephedrine   . Quinolones   . Sertraline   . Sertraline Hcl   . Sudafed (Pseudoephedrine Hcl)   . Ultram (Tramadol)     Headaches  Headaches   . Latex Rash     Current Outpatient Prescriptions  Medication Sig Dispense Refill  . albuterol (VENTOLIN HFA) 108 (90 BASE) MCG/ACT inhaler Inhale 2 puffs into the lungs every 6 (six) hours as needed. For shortness of breath      . ALPRAZolam (XANAX) 0.5 MG tablet Take 0.5 mg by mouth. Take 0.5 mg by mouth. Frequency:PRN   Dosage:0.5   MG  Instructions:  Note:Dose: 0.5MG       . amLODipine (NORVASC) 5 MG tablet Take 1 tablet (5 mg total) by mouth daily.  90 tablet  3  . aspirin 81 MG chewable tablet Chew 81 mg by mouth. Chew 81 mg. Frequency:QD   Dosage:81   MG  Instructions:  Note:Dose: 81MG       . Azelastine-Fluticasone (DYMISTA) 137-50 MCG/ACT SUSP Place 1 puff into the nose daily.      . calcium citrate-vitamin D (CITRACAL+D) 315-200 MG-UNIT per tablet Take 1 tablet by mouth 2 (two) times daily.       . Cholecalciferol (VITAMIN D PO) Take by mouth. Vitamin D3 1000 mg once a day by mouth      . clonazePAM (KLONOPIN) 0.5 MG tablet Take 0.5 mg by mouth at bedtime as needed for anxiety.       . fexofenadine (ALLEGRA) 180 MG tablet Take 90 mg by mouth daily.       . fluticasone (FLONASE) 50 MCG/ACT nasal  spray Place into the nose. into each nostril. Place 2 sprays into the nose daily.      . hydrochlorothiazide (HYDRODIURIL) 25 MG tablet Take 12.5 mg by mouth daily. Takes 1/2 tablet daily or as directed      . levothyroxine (SYNTHROID, LEVOTHROID) 125 MCG tablet Take 125 mcg by mouth daily.        [name] MICARDIS 40 MG tablet Take 1 tablet (40 mg total) by mouth daily.  90 tablet  3  . Multiple Vitamins-Minerals (ICAPS MV) TABS Take by mouth. Take by mouth. Frequency:BID   Dosage:0.0     Instructions:  Note:Dose: 1.66-0.83      . simvastatin (ZOCOR) 20 MG tablet Take 1 tablet (20 mg total) by mouth every evening.  90 tablet  3   No current facility-administered medications for this visit.    OBJECTIVE: Filed Vitals:   04/02/13 0903  BP: 126/77  Pulse: 64  Temp: 97.6 F (36.4 C)  Resp: 20     Body mass index is 23.1 kg/(m^2).    ECOG FS: 2    General appearance: Alert, cooperative, thin frame, mild distress (at times when discussing her family situation) Head: Normocephalic, without obvious abnormality, atraumatic Eyes: Arcus senilis, PERRLA, EOMI, sluggish right pupillary response Nose: Nares, septum and mucosa are normal, no drainage or sinus tenderness. Neck: No adenopathy, supple, symmetrical, trachea midline, no tenderness Resp: Clear to auscultation bilaterally Cardio: Regular rate and rhythm, S1, S2 normal, 1/6 systolic murmur, no click, rub or gallop Breasts:  Deferred GI: Soft, distended, mid-abdominal tenderness when palpated, normoactive bowel sounds, no organomegaly Extremities: Extremities normal, atraumatic, no cyanosis or edema Lymph nodes: Cervical, supraclavicular, and axillary nodes normal Neurologic: Grossly normal   LAB RESULTS: Results for DEJANE, SCHEIBE (MRN [id number]) as of 01/09/2013 10:08  Ref. Range 11/11/2011 10:28 02/29/2012 09:52 07/11/2012 09:31 11/06/2012 10:25 01/02/2013 09:16  IgG (  Immunoglobin G), Serum Latest Range: [phone] mg/dL 8119 1478 2956 (H) 2130 (H)  2090 (H)   Results for CAMRIN, LAPRE (MRN [id number]) as of 01/09/2013 10:08  Ref. Range 11/06/2012 10:25  M-SPIKE, % No range found 1.62    Lab Results  Component Value Date   WBC 6.0 03/26/2013   NEUTROABS 3.1 03/26/2013   HGB 12.8 03/26/2013   HCT 36.9 03/26/2013   MCV 85.4 03/26/2013   PLT 230 03/26/2013      Chemistry      Component Value Date/Time   NA 139 03/26/2013 0819   NA 142 02/13/2013 2127   K 3.8 03/26/2013 0819   K 3.2* 02/13/2013 2127   CL 103 03/26/2013 0819   CL 102 02/13/2013 2127   CO2 25 03/26/2013 0819   CO2 27 11/07/2012 0855   BUN 18.6 03/26/2013 0819   BUN 20 02/13/2013 2127   CREATININE 0.7 03/26/2013 0819   CREATININE 0.70 02/13/2013 2127      Component Value Date/Time   CALCIUM 9.6 03/26/2013 0819   CALCIUM 9.8 11/07/2012 0855   ALKPHOS 44 03/26/2013 0819   ALKPHOS 41 11/07/2012 0855   AST 14 03/26/2013 0819   AST 20 11/07/2012 0855   ALT 12 03/26/2013 0819   ALT 16 11/07/2012 0855   BILITOT 0.98 03/26/2013 0819   BILITOT 0.9 11/07/2012 0855      No results found for this basename: LABCA2    No components found with this basename: LABCA125    No results found for this basename: INR,  in the last 168 hours   Urinalysis    Component Value Date/Time   COLORURINE YELLOW 02/13/2013 2029   APPEARANCEUR CLOUDY* 02/13/2013 2029   LABSPEC 1.007 02/13/2013 2029   PHURINE 7.5 02/13/2013 2029   GLUCOSEU NEGATIVE 02/13/2013 2029   HGBUR SMALL* 02/13/2013 2029   BILIRUBINUR NEGATIVE 02/13/2013 2029   KETONESUR NEGATIVE 02/13/2013 2029   PROTEINUR NEGATIVE 02/13/2013 2029   UROBILINOGEN 0.2 02/13/2013 2029   NITRITE NEGATIVE 02/13/2013 2029   LEUKOCYTESUR NEGATIVE 02/13/2013 2029    STUDIES: 1.  Mammogram on 01/16/2003 showed bilateral vascular calcifications.  No dominant mass, suspicious microcalcifications or architectural distortion in either breast.  No significant interval change.  No mammographic evidence of malignancy.  Benign findings.  2.  Dg Chest 2 View 02/13/2013    *RADIOLOGY REPORT*  Clinical Data: Anxiety.  CHEST - 2 VIEW  Comparison: 10/21/2011  Findings: Small calcified granuloma the right upper lobe. Heart and mediastinal contours are within normal limits.  No focal opacities or effusions.  No acute bony abnormality.  IMPRESSION: No active cardiopulmonary disease.   Original Report Authenticated By: Charlett Nose, M.D.    ASSESSMENT:71 y.o. 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  (2)  History of papillary thyroid cancer, status post partial thyroidectomy and radioiodine treatments at [facility], again with no evidence of recurrence.  (3)  Monoclonal gammopathy of uncertain significance, IgG lambda with a negative bone survey:   (a) bone marrow biopsy at [facility] 08/24/2012 shows 20-22% plasma cells with hyper diploid cytogenetics, showing extra copies of 4, 11, 13, and 17, no cells with cyclin D1/IGH, FGFR 3/IGH or IGH/C.-MA F gene fusions. No deletion up p53 or 13q was noted. FISH results were normal.  (4)  Melanoma removed from the forehead in 1974, left arm 2005, right chest and  shoulder 2006, left mid back to 2010; followed by Janalyn Harder.  (5)   Moh's surgery to the nose x 2 in 05/2010.  (6)  Benign right breast biopsy on  12/10/2010.   PLAN:  Ms. Jasko is doing well from our point of view.  At this time, there is no evidence of lytic bone lesions, hypercalcemia, anemia, or worsening renal function.  Her MGUS has not progressed to the point of requiring an intervention.  We will continue close surveillance of her laboratory values.  We will schedule the patient for interim laboratories in 2 months and plan on seeing her again in 6 months.  Laboratories of SPEP, protein electrophoresis,  kappa/lambda light chains, CBC with differential and CMP have been ordered.  Ms. Fickling states she has a followup appointment  with Dr. Marissa Calamity at [facility] in 08/2013.    All questions answered.  The patient was encouraged to contact us in the interim with any problems, questions or concerns.  Larina Bras, NP-C 04/02/2013 12:00 PM    ADDENDUM: I met with Kesha today to go over her numbers in detail. She understands the change is minimal. While there has been a slight rise in her proteins, she does not have symptomatic myeloma and therefore we are safe not starting treatment now.  Unusually for her, she told me she very much prefers not being treated at present. Partly the reason is financial partly it is some family matters. We are going to continue to follow [facility] closely, with a low threshold for starting therapy when and if her myeloma becomes symptomatic. She knows to call for any problems that may develop before her next visit here.  I personally saw this patient and performed a substantive portion of this encounter with the listed APP documented above.   Lowella Dell, MD

## 2013-04-02 NOTE — Telephone Encounter (Signed)
appts made and printed...td 

## 2013-04-02 NOTE — Patient Instructions (Addendum)
Please contact us at [phone] if you have any questions or concerns.  Please continue to do well and enjoy life!!!  Get plenty of rest, drink plenty of water, exercise daily, eat a balanced diet.  Continue to take calcium and Vitamin D3 daily.   Consider a push lawnmower.  Results for orders placed in visit on 03/26/13 (from the past 336 hour(s))  CBC WITH DIFFERENTIAL   Collection Time    03/26/13  8:19 AM      Result Value Range   WBC 6.0  3.9 - 10.3 10e3/uL   NEUT# 3.1  1.5 - 6.5 10e3/uL   HGB 12.8  11.6 - 15.9 g/dL   HCT 46.9  62.9 - 52.8 %   Platelets 230  145 - 400 10e3/uL   MCV 85.4  79.5 - 101.0 fL   MCH 29.7  25.1 - 34.0 pg   MCHC 34.8  31.5 - 36.0 g/dL   RBC 4.13  2.44 - 0.10 10e6/uL   RDW 13.0  11.2 - 14.5 %   lymph# 2.0  0.9 - 3.3 10e3/uL   MONO# 0.6  0.1 - 0.9 10e3/uL   Eosinophils Absolute 0.2  0.0 - 0.5 10e3/uL   Basophils Absolute 0.1  0.0 - 0.1 10e3/uL   NEUT% 51.5  38.4 - 76.8 %   LYMPH% 33.3  14.0 - 49.7 %   MONO% 10.1  0.0 - 14.0 %   EOS% 4.2  0.0 - 7.0 %   BASO% 0.9  0.0 - 2.0 %  COMPREHENSIVE METABOLIC PANEL (CC13)   Collection Time    03/26/13  8:19 AM      Result Value Range   Sodium 139  136 - 145 mEq/L   Potassium 3.8  3.5 - 5.1 mEq/L   Chloride 103  98 - 107 mEq/L   CO2 25  22 - 29 mEq/L   Glucose 79  70 - 99 mg/dl   BUN 27.2  7.0 - 53.6 mg/dL   Creatinine 0.7  0.6 - 1.1 mg/dL   Total Bilirubin 6.44  0.20 - 1.20 mg/dL   Alkaline Phosphatase 44  40 - 150 U/L   AST 14  5 - 34 U/L   ALT 12  0 - 55 U/L   Total Protein 7.7  6.4 - 8.3 g/dL   Albumin 3.7  3.5 - 5.0 g/dL   Calcium 9.6  8.4 - 03.4 mg/dL  PROTEIN ELECTROPHORESIS, SERUM   Collection Time    03/26/13  8:19 AM      Result Value Range   Total Protein, serum electrophor 7.5  6.0 - 8.3 g/dL   Albumin ELP 74.2 (*) 55.8 - 66.1 %   Alpha-1-Globulin 4.0  2.9 - 4.9 %   Alpha-2-Globulin 8.6  7.1 - 11.8 %   Beta Globulin 5.7  4.7 - 7.2 %   Beta 2 24.3 (*) 3.2 - 6.5 %   Gamma  Globulin 2.1 (*) 11.1 - 18.8 %   M-Spike, % 1.72     SPE Interp. *     COMMENT (PROTEIN ELECTROPHOR) *    KAPPA/LAMBDA LIGHT CHAINS   Collection Time    03/26/13  8:19 AM      Result Value Range   Kappa free light chain 0.16 (*) 0.33 - 1.94 mg/dL   Lambda Free Lght Chn 35.50 (*) 0.57 - 2.63 mg/dL   Kappa:Lambda Ratio 5.95 (*) 0.26 - 1.65

## 2013-04-10 ENCOUNTER — Ambulatory Visit: Payer: Medicare Other | Admitting: Critical Care Medicine

## 2013-04-10 DIAGNOSIS — N052 Unspecified nephritic syndrome with diffuse membranous glomerulonephritis: Secondary | ICD-10-CM | POA: Diagnosis not present

## 2013-04-10 DIAGNOSIS — N042 Nephrotic syndrome with diffuse membranous glomerulonephritis: Secondary | ICD-10-CM | POA: Diagnosis not present

## 2013-04-10 DIAGNOSIS — R319 Hematuria, unspecified: Secondary | ICD-10-CM | POA: Diagnosis not present

## 2013-04-10 DIAGNOSIS — I10 Essential (primary) hypertension: Secondary | ICD-10-CM | POA: Diagnosis not present

## 2013-04-12 ENCOUNTER — Telehealth: Payer: Self-pay | Admitting: Critical Care Medicine

## 2013-04-12 ENCOUNTER — Encounter: Payer: Self-pay | Admitting: Critical Care Medicine

## 2013-04-12 ENCOUNTER — Ambulatory Visit (INDEPENDENT_AMBULATORY_CARE_PROVIDER_SITE_OTHER): Payer: Medicare Other | Admitting: Critical Care Medicine

## 2013-04-12 VITALS — BP 124/70 | HR 69 | Temp 97.9°F | Ht 61.0 in | Wt 124.0 lb

## 2013-04-12 DIAGNOSIS — J45909 Unspecified asthma, uncomplicated: Secondary | ICD-10-CM | POA: Diagnosis not present

## 2013-04-12 NOTE — Assessment & Plan Note (Signed)
Moderate persistent asthma with primary cough variation At this time no evidence of acute flare or active coughing I suspect this patient got dehydrated and overheated staying outside in the heat over the last 4 days Plan Patient advised to not get out in the heat and to stay inside when the air temperature is much above 85 Continue maintenance inhalers at this time

## 2013-04-12 NOTE — Telephone Encounter (Signed)
Last OV 03-19-13. I spoke with the pt and she states she overall does not feel well for the last 5 days. Pt is c/o having chills, chest tightness and discomfort, right ear pain, increased productive cough with clear phelgm, PND, nasal congestion, hoarseness. Pt states over the last 2 days she has had to take a nap in the middle of the day and she states this is very unusual for her. Pt states she has been using asmanex, and dymista daily as prescribed and has been following reflux diet. Pt has not used her albuterol inhaler through all of this. I advised the pt that if she has chest tightness again that she could try her albuterol to see if this would help. I advised I will send message to Dr. Delford Field to see what other recs he may have for her. Please advsie. Carron Curie, CMA Allergies  Allergen Reactions  . Aldactone (Spironolactone)     Burning in stomach Burning in stomach  . Hydrocodone   . Levofloxacin   . Pseudoephedrine   . Quinolones   . Sertraline   . Sertraline Hcl   . Sudafed (Pseudoephedrine Hcl)   . Ultram (Tramadol)     Headaches  Headaches   . Latex Rash

## 2013-04-12 NOTE — Telephone Encounter (Signed)
Needs OV work in any provider or TP

## 2013-04-12 NOTE — Progress Notes (Signed)
Subjective:    Patient ID: [name], female    DOB: 1941/08/20, 72 y.o.   MRN: [id number]  HPI   72 y.o. WF  never smoker with  H/o asthma/ allergies to dust mold  and referred by Dr Clarene Duke 10/21/2011 for pulmonary evaluation for difficult to control asthma     04/12/2013 Chief Complaint  Patient presents with  . Acute Visit    Reports coughing with production of clear mucus and PND x1 week. States that she has also been having lots of fatigue, fever/chills. Has been using Dymista and Asmanex as rx'd.   Stay on Dymista one puff twice daily Use albuterol as needed No further antibiotics or steroids Stay on asmanex Follow a reflux diet  Pt with chills that come and go.  Pt out a lot this weekend in the heat.  Notes more cough.  Feel chilly, no rigors.  No fever. Cough is productive white to gray mucus. Notes min dyspnea with exertion   Past Medical History  Diagnosis Date  . 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 History of blood clots     LEG  . Stroke   . Hernia   . MGUS (monoclonal gammopathy of unknown significance)   . Chronic kidney disease   . Hypertension   . Hyperlipidemia   . Thyroid disease   . Arthritis   . Mitral valve prolapse   . Anxiety   . Perforated bowel   . Peritonitis   . Diverticulosis     Pt reported on 03/15/12  . MGUS (monoclonal gammopathy of unknown significance)   . Cancer     THYROID, SKIN, NOSE, BONE  . Melanoma   . Melanoma 1989    chest     Family History  Problem Relation Age of Onset  . Stroke Mother   . Asthma Father   . Diabetes Father   . Cancer Father   . Heart failure Father   . Cancer Sister   . Diabetes Sister   . Cancer Brother   . Cancer Father     Prostate and kidney cancer  . Stroke Mother 61    Her mother passed away from this stroke at the age of 62  . Cervical cancer Sister   . Cancer Sister     Recurrent cancer involving the neck and the jaw     History   Social History  . Marital Status:  Divorced    Spouse Name: N/A    Number of Children: 2  . Years of Education: N/A   Occupational History  . Retired     Holiday representative   Social History Main Topics  . Smoking status: Never Smoker   . Smokeless tobacco: Never Used  . Alcohol Use: No  . Drug Use: No  . Sexually Active: No   Other Topics Concern  . Not on file   Social History Narrative  . No narrative on file     Allergies  Allergen Reactions  . Aldactone (Spironolactone)     Burning in stomach Burning in stomach  . Hydrocodone   . Levofloxacin   . Pseudoephedrine   . Quinolones   . Sertraline   . Sertraline Hcl   . Sudafed (Pseudoephedrine Hcl)   . Ultram (Tramadol)     Headaches  Headaches   . Latex Rash     Outpatient Prescriptions Prior to Visit  Medication Sig Dispense Refill  . ALPRAZolam (XANAX) 0.5 MG tablet  Take 0.5 mg by mouth. Take 0.5 mg by mouth. Frequency:PRN   Dosage:0.5   MG  Instructions:  Note:Dose: 0.5MG       . amLODipine (NORVASC) 5 MG tablet Take 1 tablet (5 mg total) by mouth daily.  90 tablet  3  . aspirin 81 MG chewable tablet Chew 81 mg by mouth. Chew 81 mg. Frequency:QD   Dosage:81   MG  Instructions:  Note:Dose: 81MG       . Azelastine-Fluticasone (DYMISTA) 137-50 MCG/ACT SUSP Place 1 puff into the nose daily.      . calcium citrate-vitamin D (CITRACAL+D) 315-200 MG-UNIT per tablet Take 1 tablet by mouth 2 (two) times daily.       . Cholecalciferol (VITAMIN D PO) Take by mouth. Vitamin D3 1000 mg once a day by mouth      . clonazePAM (KLONOPIN) 0.5 MG tablet Take 0.5 mg by mouth at bedtime as needed for anxiety.       . fexofenadine (ALLEGRA) 180 MG tablet Take 90 mg by mouth daily.       . hydrochlorothiazide (HYDRODIURIL) 25 MG tablet Take 12.5 mg by mouth daily. Takes 1/2 tablet daily or as directed      . levothyroxine (SYNTHROID, LEVOTHROID) 125 MCG tablet Take 125 mcg by mouth daily.        [name] MICARDIS 40 MG tablet Take 1 tablet (40 mg total) by mouth daily.  90 tablet  3   . simvastatin (ZOCOR) 20 MG tablet Take 1 tablet (20 mg total) by mouth every evening.  90 tablet  3  . fluticasone (FLONASE) 50 MCG/ACT nasal spray Place into the nose. into each nostril. Place 2 sprays into the nose daily.      . Multiple Vitamins-Minerals (ICAPS MV) TABS Take by mouth. Take by mouth. Frequency:BID   Dosage:0.0     Instructions:  Note:Dose: 1.66-0.83      . albuterol (VENTOLIN HFA) 108 (90 BASE) MCG/ACT inhaler Inhale 2 puffs into the lungs every 6 (six) hours as needed. For shortness of breath       No facility-administered medications prior to visit.       Review of Systems  Constitutional:   No  weight loss, night sweats,  Fevers, chills, fatigue, lassitude. HEENT:   No headaches,  Difficulty swallowing,  Tooth/dental problems,  +++Sore throat,                No sneezing, itching, ear ache,+++ nasal congestion, +++post nasal drip,   CV:  No chest pain,  Orthopnea, PND, swelling in lower extremities, anasarca, dizziness, palpitations  GI  No heartburn, indigestion, abdominal pain, nausea, vomiting, diarrhea, change in bowel habits, loss of appetite  Resp: Notes shortness of breath with exertion not at rest.  No wheezing.  No chest wall deformity  Skin: no rash or lesions.  GU: no dysuria, change in color of urine, no urgency or frequency.  No flank pain.  MS:  No joint pain or swelling.  No decreased range of motion.  No back pain.  Psych:  No change in mood or affect. No depression or anxiety.  No memory loss.     Objective:   Physical Exam  Filed Vitals:   04/12/13 1547  BP: 124/70  Pulse: 69  Temp: 97.9 F (36.6 C)  TempSrc: Oral  Height: 5\' 1"  (1.549 m)  Weight: 124 lb (56.246 kg)  SpO2: 97%    Gen: Pleasant, well-nourished, in no distress,  normal affect  ENT: No lesions,  mouth clear,  oropharynx clear,no purulence in nares  Neck: No JVD, no TMG, no carotid bruits  Lungs: No use of accessory muscles, no dullness to percussion, distant  breath sounds  Cardiovascular: RRR, heart sounds normal, no murmur or gallops, no peripheral edema  Abdomen: soft and NT, no HSM,  BS normal  Musculoskeletal: No deformities, no cyanosis or clubbing  Neuro: alert, non focal  Skin: Warm, no lesions or rashes     Assessment & Plan:   Moderate persistent asthma with primary cough variation Moderate persistent asthma with primary cough variation At this time no evidence of acute flare or active coughing I suspect this patient got dehydrated and overheated staying outside in the heat over the last 4 days Plan Patient advised to not get out in the heat and to stay inside when the air temperature is much above 85 Continue maintenance inhalers at this time    Updated Medication List Outpatient Encounter Prescriptions as of 04/12/2013  Medication Sig Dispense Refill  . ALPRAZolam (XANAX) 0.5 MG tablet Take 0.5 mg by mouth. Take 0.5 mg by mouth. Frequency:PRN   Dosage:0.5   MG  Instructions:  Note:Dose: 0.5MG       . amLODipine (NORVASC) 5 MG tablet Take 1 tablet (5 mg total) by mouth daily.  90 tablet  3  . aspirin 81 MG chewable tablet Chew 81 mg by mouth. Chew 81 mg. Frequency:QD   Dosage:81   MG  Instructions:  Note:Dose: 81MG       . Azelastine-Fluticasone (DYMISTA) 137-50 MCG/ACT SUSP Place 1 puff into the nose daily.      . calcium citrate-vitamin D (CITRACAL+D) 315-200 MG-UNIT per tablet Take 1 tablet by mouth 2 (two) times daily.       . Cholecalciferol (VITAMIN D PO) Take by mouth. Vitamin D3 1000 mg once a day by mouth      . clonazePAM (KLONOPIN) 0.5 MG tablet Take 0.5 mg by mouth at bedtime as needed for anxiety.       . fexofenadine (ALLEGRA) 180 MG tablet Take 90 mg by mouth daily.       . hydrochlorothiazide (HYDRODIURIL) 25 MG tablet Take 12.5 mg by mouth daily. Takes 1/2 tablet daily or as directed      . levothyroxine (SYNTHROID, LEVOTHROID) 125 MCG tablet Take 125 mcg by mouth daily.        [name] MICARDIS 40 MG tablet Take 1  tablet (40 mg total) by mouth daily.  90 tablet  3  . mometasone (ASMANEX) 220 MCG/INH inhaler Inhale 2 puffs into the lungs daily.      . simvastatin (ZOCOR) 20 MG tablet Take 1 tablet (20 mg total) by mouth every evening.  90 tablet  3  . [DISCONTINUED] fluticasone (FLONASE) 50 MCG/ACT nasal spray Place into the nose. into each nostril. Place 2 sprays into the nose daily.      . [DISCONTINUED] Multiple Vitamins-Minerals (ICAPS MV) TABS Take by mouth. Take by mouth. Frequency:BID   Dosage:0.0     Instructions:  Note:Dose: 1.66-0.83      . albuterol (VENTOLIN HFA) 108 (90 BASE) MCG/ACT inhaler Inhale 2 puffs into the lungs every 6 (six) hours as needed. For shortness of breath       No facility-administered encounter medications on file as of 04/12/2013.

## 2013-04-12 NOTE — Patient Instructions (Addendum)
No medication changes Return 3 months

## 2013-04-12 NOTE — Telephone Encounter (Signed)
I spoke with the pt and offered appt here in [place] with [facility], but pt insists on seeing [facility] only. Pt states she has been to [facility] before and will go now. I called and spoke with Crystal and she advised ok to add pt on and to advise to be there by 3:45. Pt aware. Carron Curie, CMA

## 2013-04-19 ENCOUNTER — Telehealth: Payer: Self-pay | Admitting: Oncology

## 2013-04-19 NOTE — Telephone Encounter (Signed)
Faxed pt labs to Dr. Sigmund Hazel

## 2013-04-27 ENCOUNTER — Ambulatory Visit (INDEPENDENT_AMBULATORY_CARE_PROVIDER_SITE_OTHER): Payer: Medicare Other | Admitting: Cardiology

## 2013-04-27 ENCOUNTER — Encounter: Payer: Self-pay | Admitting: Cardiology

## 2013-04-27 ENCOUNTER — Other Ambulatory Visit (INDEPENDENT_AMBULATORY_CARE_PROVIDER_SITE_OTHER): Payer: Medicare Other

## 2013-04-27 VITALS — BP 124/80 | HR 60 | Ht 61.0 in | Wt 122.0 lb

## 2013-04-27 DIAGNOSIS — E7800 Pure hypercholesterolemia, unspecified: Secondary | ICD-10-CM | POA: Diagnosis not present

## 2013-04-27 DIAGNOSIS — R42 Dizziness and giddiness: Secondary | ICD-10-CM | POA: Insufficient documentation

## 2013-04-27 DIAGNOSIS — I119 Hypertensive heart disease without heart failure: Secondary | ICD-10-CM

## 2013-04-27 LAB — BASIC METABOLIC PANEL
BUN: 16 mg/dL (ref 6–23)
Chloride: 103 mEq/L (ref 96–112)
Potassium: 3.5 mEq/L (ref 3.5–5.1)

## 2013-04-27 LAB — HEPATIC FUNCTION PANEL
Bilirubin, Direct: 0.1 mg/dL (ref 0.0–0.3)
Total Protein: 7.4 g/dL (ref 6.0–8.3)

## 2013-04-27 LAB — LIPID PANEL: Cholesterol: 154 mg/dL (ref 0–200)

## 2013-04-27 NOTE — Assessment & Plan Note (Signed)
Patient has history of hypercholesterolemia.  Lab work is pending.  He is on simvastatin 20 mg daily.  No myalgias notable

## 2013-04-27 NOTE — Progress Notes (Signed)
[name] Date of Birth:  1941-03-05 [facility] [address] [place], [place]  [address] [phone]         Fax   [phone]  History of Present Illness: This pleasant 72 year old woman is seen for followup office visit. She has a past history of essential hypertension. She also has a past history of hypercholesterolemia and a past history of melanoma as well as thyroid cancer. She has had previous atypical chest pain. She had a normal nuclear stress test in 2006. Since last visit she has had several new problems. 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 She is now being followed for this by Dr. Marissa Calamity at [facility]. Her last PET scan did not show any bony lesions. Over the course of the winter she has had a lot of pulmonary and respiratory issues.  She has been extremely active in the family affairs and has had some dizzy spells related to stress and tension.  In the past she has used meclizine and we will advise her to get some meclizine over-the-counter   Current Outpatient Prescriptions  Medication Sig Dispense Refill  . albuterol (VENTOLIN HFA) 108 (90 BASE) MCG/ACT inhaler Inhale 2 puffs into the lungs every 6 (six) hours as needed. For shortness of breath      . ALPRAZolam (XANAX) 0.5 MG tablet Take 0.5 mg by mouth. Take 0.5 mg by mouth. Frequency:PRN   Dosage:0.5   MG  Instructions:  Note:Dose: 0.5MG       . amLODipine (NORVASC) 5 MG tablet Take 1 tablet (5 mg total) by mouth daily.  90 tablet  3  . aspirin 81 MG chewable tablet Chew 81 mg by mouth. Chew 81 mg. Frequency:QD   Dosage:81   MG  Instructions:  Note:Dose: 81MG       . Azelastine-Fluticasone (DYMISTA) 137-50 MCG/ACT SUSP Place 1 puff into the nose daily.      . calcium citrate-vitamin D (CITRACAL+D) 315-200 MG-UNIT per tablet Take 1 tablet by mouth 2 (two) times daily.       . Cholecalciferol (VITAMIN D PO) Take by mouth. Vitamin D3 1000 mg once a day by mouth      . clonazePAM  (KLONOPIN) 0.5 MG tablet Take 0.5 mg by mouth at bedtime as needed for anxiety.       . fexofenadine (ALLEGRA) 180 MG tablet Take 90 mg by mouth daily.       . hydrochlorothiazide (HYDRODIURIL) 25 MG tablet Take 12.5 mg by mouth daily. Takes 1/2 tablet daily or as directed      . levothyroxine (SYNTHROID, LEVOTHROID) 125 MCG tablet Take 125 mcg by mouth daily.        . meclizine (ANTIVERT) 25 MG tablet Take 25 mg by mouth as needed.      [name] MICARDIS 40 MG tablet Take 1 tablet (40 mg total) by mouth daily.  90 tablet  3  . mometasone (ASMANEX) 220 MCG/INH inhaler Inhale 2 puffs into the lungs daily.      . simvastatin (ZOCOR) 20 MG tablet Take 1 tablet (20 mg total) by mouth every evening.  90 tablet  3   No current facility-administered medications for this visit.    Allergies  Allergen Reactions  . Aldactone (Spironolactone)     Burning in stomach Burning in stomach  . Hydrocodone   . Levofloxacin   . Pseudoephedrine   . Quinolones   . Sertraline   . Sertraline Hcl   .  Sudafed (Pseudoephedrine Hcl)   . Ultram (Tramadol)     Headaches  Headaches   . Latex Rash    Patient Active Problem List   Diagnosis Date Noted  . Dizziness 04/27/2013  . MGUS (monoclonal gammopathy of unknown significance) 11/26/2011  . Moderate persistent asthma with primary cough variation 09/21/2011  . Chest pain, atypical 08/04/2011  . Benign hypertensive heart disease without heart failure 03/31/2011  . Hypercholesterolemia 03/31/2011  . Hypothyroidism 03/31/2011  . History of melanoma 03/31/2011  . Hx of thyroid cancer 03/31/2011    History  Smoking status  . Never Smoker   Smokeless tobacco  . Never Used    History  Alcohol Use No    Family History  Problem Relation Age of Onset  . Stroke Mother   . Asthma Father   . Diabetes Father   . Cancer Father   . Heart failure Father   . Cancer Sister   . Diabetes Sister   . Cancer Brother   . Cancer Father     Prostate and kidney  cancer  . Stroke Mother 41    Her mother passed away from this stroke at the age of 22  . Cervical cancer Sister   . Cancer Sister     Recurrent cancer involving the neck and the jaw    Review of Systems: Constitutional: no fever chills diaphoresis or fatigue or change in weight.  Head and neck: no hearing loss, no epistaxis, no photophobia or visual disturbance. Respiratory: No cough, shortness of breath or wheezing. Cardiovascular: No chest pain peripheral edema, palpitations. Gastrointestinal: No abdominal distention, no abdominal pain, no change in bowel habits hematochezia or melena. Genitourinary: No dysuria, no frequency, no urgency, no nocturia. Musculoskeletal:No arthralgias, no back pain, no gait disturbance or myalgias. Neurological: No dizziness, no headaches, no numbness, no seizures, no syncope, no weakness, no tremors. Hematologic: No lymphadenopathy, no easy bruising. Psychiatric: No confusion, no hallucinations, no sleep disturbance.    Physical Exam: Filed Vitals:   04/27/13 1022  BP: 124/80  Pulse: 60   the general appearance reveals a well-developed well-nourished elderly woman in no distress.The head and neck exam reveals pupils equal and reactive.  Extraocular movements are full.  There is no scleral icterus.  The mouth and pharynx are normal.  The neck is supple.  The carotids reveal no bruits.  The jugular venous pressure is normal.  The  thyroid is not enlarged.  There is no lymphadenopathy.  The chest is clear to percussion and auscultation.  There are no rales or rhonchi.  Expansion of the chest is symmetrical.  The precordium is quiet.  The first heart sound is normal.  The second heart sound is physiologically split.  There is no murmur gallop rub or click.  There is no abnormal lift or heave.  The abdomen is soft and nontender.  The bowel sounds are normal.  The liver and spleen are not enlarged.  There are no abdominal masses.  There are no abdominal bruits.   Extremities reveal good pedal pulses.  There is no phlebitis or edema.  There is no cyanosis or clubbing.  Strength is normal and symmetrical in all extremities.  There is no lateralizing weakness.  There are no sensory deficits.  The skin is warm and dry.  There is no rash.  EKG shows sinus rhythm and is within normal limits.  Nothing on EKG to suggest cardiac cause of dizzy spells.   Assessment / Plan: Continue on same medication.  Try to get more rest and cut down on his activities.  Recheck in 4 months for office visit lipid panel hepatic function panel basal metabolic panel

## 2013-04-27 NOTE — Assessment & Plan Note (Signed)
Her dizziness does not sound cardiac.  There is a sense of motion suggesting vertigo brought on by stress

## 2013-04-27 NOTE — Patient Instructions (Signed)
TRY SOME OVER THE COUNTER MECLIZINE 25 MG AS NEEDED FOR DIZZINESS, IF NO BETTER CALL YOUR PRIMARY CARE DOCTOR  Your physician wants you to follow-up in: 4 months with fasting labs (lp/bmet/hfp)  You will receive a reminder letter in the mail two months in advance. If you don't receive a letter, please call our office to schedule the follow-up appointment.

## 2013-04-27 NOTE — Progress Notes (Signed)
Quick Note:  Please report to patient. The recent labs are stable. Continue same medication and careful diet. ______ 

## 2013-04-27 NOTE — Assessment & Plan Note (Signed)
Blood pressure is stable on current therapy. 

## 2013-05-01 ENCOUNTER — Telehealth: Payer: Self-pay | Admitting: Cardiology

## 2013-05-01 NOTE — Telephone Encounter (Signed)
Advised patient of lab results and mailed copy 

## 2013-05-01 NOTE — Telephone Encounter (Signed)
Follow up  Pt is calling regarding her lab results.

## 2013-05-01 NOTE — Telephone Encounter (Signed)
Message copied by Burnell Blanks on Tue May 01, 2013  4:36 PM ------      Message from: Cassell Clement      Created: Fri Apr 27, 2013 12:58 PM       Please report to patient.  The recent labs are stable. Continue same medication and careful diet. ------

## 2013-05-04 ENCOUNTER — Ambulatory Visit: Payer: Medicare Other | Admitting: Critical Care Medicine

## 2013-05-11 ENCOUNTER — Other Ambulatory Visit: Payer: Self-pay | Admitting: Cardiology

## 2013-06-01 ENCOUNTER — Telehealth: Payer: Self-pay | Admitting: Critical Care Medicine

## 2013-06-01 ENCOUNTER — Encounter: Payer: Self-pay | Admitting: Pulmonary Disease

## 2013-06-01 ENCOUNTER — Other Ambulatory Visit (HOSPITAL_BASED_OUTPATIENT_CLINIC_OR_DEPARTMENT_OTHER): Payer: Medicare Other | Admitting: Lab

## 2013-06-01 ENCOUNTER — Ambulatory Visit (INDEPENDENT_AMBULATORY_CARE_PROVIDER_SITE_OTHER): Payer: Medicare Other | Admitting: Pulmonary Disease

## 2013-06-01 ENCOUNTER — Other Ambulatory Visit: Payer: Self-pay | Admitting: Obstetrics and Gynecology

## 2013-06-01 DIAGNOSIS — L03213 Periorbital cellulitis: Secondary | ICD-10-CM | POA: Insufficient documentation

## 2013-06-01 DIAGNOSIS — D472 Monoclonal gammopathy: Secondary | ICD-10-CM | POA: Diagnosis not present

## 2013-06-01 DIAGNOSIS — H05019 Cellulitis of unspecified orbit: Secondary | ICD-10-CM

## 2013-06-01 DIAGNOSIS — Z9189 Other specified personal risk factors, not elsewhere classified: Secondary | ICD-10-CM | POA: Diagnosis not present

## 2013-06-01 DIAGNOSIS — Z85828 Personal history of other malignant neoplasm of skin: Secondary | ICD-10-CM | POA: Diagnosis not present

## 2013-06-01 LAB — CBC WITH DIFFERENTIAL/PLATELET
Basophils Absolute: 0 10*3/uL (ref 0.0–0.1)
Eosinophils Absolute: 0.2 10*3/uL (ref 0.0–0.5)
HGB: 13.1 g/dL (ref 11.6–15.9)
MONO#: 0.5 10*3/uL (ref 0.1–0.9)
NEUT#: 3.2 10*3/uL (ref 1.5–6.5)
RDW: 13.1 % (ref 11.2–14.5)
WBC: 6 10*3/uL (ref 3.9–10.3)
lymph#: 2 10*3/uL (ref 0.9–3.3)

## 2013-06-01 LAB — COMPREHENSIVE METABOLIC PANEL (CC13)
Albumin: 3.8 g/dL (ref 3.5–5.0)
CO2: 23 mEq/L (ref 22–29)
Chloride: 106 mEq/L (ref 98–109)
Glucose: 100 mg/dl (ref 70–140)
Potassium: 4 mEq/L (ref 3.5–5.1)
Sodium: 142 mEq/L (ref 136–145)
Total Protein: 7.9 g/dL (ref 6.4–8.3)

## 2013-06-01 MED ORDER — AMOXICILLIN-POT CLAVULANATE 875-125 MG PO TABS: 1.0000 | ORAL_TABLET | Freq: Two times a day (BID) | ORAL | Status: DC

## 2013-06-01 NOTE — Patient Instructions (Addendum)
Will treat with augmentin 875mg  one am and pm on full stomach for next 10 days. Continue your sinus rinses. Please call us if this is getting worse or not improving.

## 2013-06-01 NOTE — Telephone Encounter (Signed)
Difficult to say without an OV  ?any openings with TP today? Or else urgent care or PCP

## 2013-06-01 NOTE — Progress Notes (Signed)
  Subjective:    Patient ID: [name], female    DOB: [birth year], 72 y.o.   MRN: [id number]  HPI Patient comes in today for an acute sick visit.  She has a history of asthma, but has had a one-week history of tenderness, warmth, and swelling of her left nasolabial fold.  It is tender into her left nostril, but she is not producing any purulent mucus from her nose.  She denies any fevers, chills, or sweats.  Her breathing has been totally normal.   Review of Systems  Constitutional: Negative for fever and unexpected weight change.  HENT: Positive for congestion, rhinorrhea, sneezing, postnasal drip and sinus pressure. Negative for ear pain, nosebleeds, sore throat, trouble swallowing and dental problem.   Eyes: Negative for redness and itching.  Respiratory: Negative for cough, chest tightness, shortness of breath and wheezing.   Cardiovascular: Negative for palpitations and leg swelling.  Gastrointestinal: Negative for nausea and vomiting.  Genitourinary: Negative for dysuria.  Musculoskeletal: Negative for joint swelling.  Skin: Negative for rash.  Neurological: Positive for headaches.  Hematological: Does not bruise/bleed easily.  Psychiatric/Behavioral: Negative for dysphoric mood. The patient is not nervous/anxious.        Objective:   Physical Exam Frail-appearing female in no acute distress Nose without purulent discharge, however there is significant erythema and edema along the left nasolabial fold, and extending into the infraorbital area.  It is warm to the touch. Oropharynx clear Neck without lymphadenopathy or thyromegaly Chest totally clear to auscultation, no wheezing Lower extremities without significant edema, no cyanosis Alert and oriented, moves all 4 extremities.       Assessment & Plan:

## 2013-06-01 NOTE — Assessment & Plan Note (Signed)
The patient has had swelling and erythema along her left nasolabial fold, and extending up into the infraorbital area.  She thinks this may be related to a sinusitis, but I am more concerned about a periorbital cellulitis.  The area is swollen and warm to the touch.  At this point, we'll treat her with a 10 day course of Augmentin, but I have stressed to her the seriousness of the situation if it is not improving.  She will let us know.

## 2013-06-01 NOTE — Telephone Encounter (Signed)
[facility] had a 9:45 opening and is scheduled to come in. Nothing further needed

## 2013-06-01 NOTE — Addendum Note (Signed)
Addended by: Maisie Fus on: 06/01/2013 10:15 AM   Modules accepted: Orders

## 2013-06-01 NOTE — Telephone Encounter (Signed)
I spoke with pt. She stated she had someone cleaned out her attack few days ago. For about 2-3 days now pt stated she feels like something is crawling around her mouth, nose, cheek. She stated beside her left nostril it is swollen and broken out. The swelling she stated is the size of her thumb. She feels like her sinus's are swollen. She does c/o clear drainage and coughing w/ clear phlem. Pt has not contacted her PCP bc she feels this is an issue Dr. Delford Field can address. Please advise Dr. Delford Field as TP is not in today thanks  Allergies  Allergen Reactions  . Aldactone [Spironolactone]     Burning in stomach Burning in stomach  . Hydrocodone   . Levofloxacin   . Pseudoephedrine   . Quinolones   . Sertraline   . Sertraline Hcl   . Sudafed [Pseudoephedrine Hcl]   . Ultram [Tramadol]     Headaches  Headaches   . Latex Rash

## 2013-06-02 ENCOUNTER — Telehealth: Payer: Self-pay | Admitting: Internal Medicine

## 2013-06-02 ENCOUNTER — Encounter (HOSPITAL_COMMUNITY): Payer: Self-pay | Admitting: Emergency Medicine

## 2013-06-02 ENCOUNTER — Emergency Department (HOSPITAL_COMMUNITY)
Admission: EM | Admit: 2013-06-02 | Discharge: 2013-06-02 | Disposition: A | Discharge status: Home / Self Care | Payer: Medicare Other | Attending: Emergency Medicine | Admitting: Emergency Medicine

## 2013-06-02 DIAGNOSIS — Z8673 Personal history of transient ischemic attack (TIA), and cerebral infarction without residual deficits: Secondary | ICD-10-CM | POA: Diagnosis not present

## 2013-06-02 DIAGNOSIS — H5460 Unqualified visual loss, one eye, unspecified: Secondary | ICD-10-CM | POA: Insufficient documentation

## 2013-06-02 DIAGNOSIS — Z8522 Personal history of malignant neoplasm of nasal cavities, middle ear, and accessory sinuses: Secondary | ICD-10-CM | POA: Diagnosis not present

## 2013-06-02 DIAGNOSIS — Z91040 Latex allergy status: Secondary | ICD-10-CM | POA: Insufficient documentation

## 2013-06-02 DIAGNOSIS — Z85850 Personal history of malignant neoplasm of thyroid: Secondary | ICD-10-CM | POA: Diagnosis not present

## 2013-06-02 DIAGNOSIS — R209 Unspecified disturbances of skin sensation: Secondary | ICD-10-CM | POA: Diagnosis not present

## 2013-06-02 DIAGNOSIS — R609 Edema, unspecified: Secondary | ICD-10-CM | POA: Insufficient documentation

## 2013-06-02 DIAGNOSIS — E079 Disorder of thyroid, unspecified: Secondary | ICD-10-CM | POA: Diagnosis not present

## 2013-06-02 DIAGNOSIS — L0201 Cutaneous abscess of face: Secondary | ICD-10-CM | POA: Insufficient documentation

## 2013-06-02 DIAGNOSIS — N189 Chronic kidney disease, unspecified: Secondary | ICD-10-CM | POA: Insufficient documentation

## 2013-06-02 DIAGNOSIS — M129 Arthropathy, unspecified: Secondary | ICD-10-CM | POA: Insufficient documentation

## 2013-06-02 DIAGNOSIS — Z8679 Personal history of other diseases of the circulatory system: Secondary | ICD-10-CM | POA: Diagnosis not present

## 2013-06-02 DIAGNOSIS — Z8719 Personal history of other diseases of the digestive system: Secondary | ICD-10-CM | POA: Diagnosis not present

## 2013-06-02 DIAGNOSIS — Z85830 Personal history of malignant neoplasm of bone: Secondary | ICD-10-CM | POA: Insufficient documentation

## 2013-06-02 DIAGNOSIS — L03211 Cellulitis of face: Secondary | ICD-10-CM

## 2013-06-02 DIAGNOSIS — Z79899 Other long term (current) drug therapy: Secondary | ICD-10-CM | POA: Diagnosis not present

## 2013-06-02 DIAGNOSIS — I129 Hypertensive chronic kidney disease with stage 1 through stage 4 chronic kidney disease, or unspecified chronic kidney disease: Secondary | ICD-10-CM | POA: Insufficient documentation

## 2013-06-02 DIAGNOSIS — F411 Generalized anxiety disorder: Secondary | ICD-10-CM | POA: Diagnosis not present

## 2013-06-02 DIAGNOSIS — E785 Hyperlipidemia, unspecified: Secondary | ICD-10-CM | POA: Diagnosis not present

## 2013-06-02 DIAGNOSIS — M7989 Other specified soft tissue disorders: Secondary | ICD-10-CM | POA: Diagnosis not present

## 2013-06-02 DIAGNOSIS — IMO0002 Reserved for concepts with insufficient information to code with codable children: Secondary | ICD-10-CM | POA: Diagnosis not present

## 2013-06-02 DIAGNOSIS — Z85820 Personal history of malignant melanoma of skin: Secondary | ICD-10-CM | POA: Diagnosis not present

## 2013-06-02 LAB — CBC WITH DIFFERENTIAL/PLATELET
Basophils Absolute: 0 10*3/uL (ref 0.0–0.1)
Basophils Relative: 0 % (ref 0–1)
Eosinophils Absolute: 0.1 10*3/uL (ref 0.0–0.7)
Eosinophils Relative: 2 % (ref 0–5)
HCT: 36 % (ref 36.0–46.0)
Hemoglobin: 12.1 g/dL (ref 12.0–15.0)
Lymphocytes Relative: 25 % (ref 12–46)
Lymphs Abs: 1.7 10*3/uL (ref 0.7–4.0)
MCH: 29.6 pg (ref 26.0–34.0)
MCHC: 33.6 g/dL (ref 30.0–36.0)
MCV: 88 fL (ref 78.0–100.0)
Monocytes Absolute: 0.5 K/uL (ref 0.1–1.0)
Monocytes Relative: 7 % (ref 3–12)
Neutro Abs: 4.4 K/uL (ref 1.7–7.7)
Neutrophils Relative %: 65 % (ref 43–77)
Platelets: 241 10*3/uL (ref 150–400)
RBC: 4.09 MIL/uL (ref 3.87–5.11)
RDW: 12.6 % (ref 11.5–15.5)
WBC: 6.7 10*3/uL (ref 4.0–10.5)

## 2013-06-02 LAB — BASIC METABOLIC PANEL WITH GFR
BUN: 16 mg/dL (ref 6–23)
Calcium: 9.8 mg/dL (ref 8.4–10.5)
Chloride: 102 meq/L (ref 96–112)
Creatinine, Ser: 0.58 mg/dL (ref 0.50–1.10)
GFR calc Af Amer: >90 mL/min (ref >90)

## 2013-06-02 LAB — BASIC METABOLIC PANEL
CO2: 27 mEq/L (ref 19–32)
GFR calc non Af Amer: 89 mL/min — ABNORMAL LOW (ref >90)
Glucose, Bld: 108 mg/dL — ABNORMAL HIGH (ref 70–99)
Potassium: 3.4 mEq/L — ABNORMAL LOW (ref 3.5–5.1)
Sodium: 137 mEq/L (ref 135–145)

## 2013-06-02 MED ORDER — CLINDAMYCIN PHOSPHATE 600 MG/50ML IV SOLN
600.0000 mg | Freq: Once | INTRAVENOUS | Status: AC
  Administered 2013-06-02: 600 mg via INTRAVENOUS
  Filled 2013-06-02: qty 50

## 2013-06-02 MED ORDER — DOXYCYCLINE HYCLATE 100 MG PO CAPS: 100.0000 mg | ORAL_CAPSULE | Freq: Two times a day (BID) | ORAL | Status: DC

## 2013-06-02 NOTE — Telephone Encounter (Signed)
On call- Seen at [facility] Pulm/ Dr Shelle Iron 8/29 for ?facial cellulitis vs sinusitis and started on augmentin w/o CT max/fac. Now calls reporting apparent extension. 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  Recommend- Go to ER for EDP eval, probable CT, and possible ENT consult. She agrees.

## 2013-06-02 NOTE — Discharge Instructions (Signed)
Facial Infection °You have an infection of your face. This requires special attention to help prevent serious problems. Infections in facial wounds can cause poor healing and scars. They can also spread to deeper tissues, especially around the eye. Wound and dental infections can lead to sinusitis, infection of the eye socket, and even meningitis. Permanent damage to the skin, eye, and nervous system may result if facial infections are not treated properly. With severe infections, hospital care for IV antibiotic injections may be needed if they don't respond to oral antibiotics. °Antibiotics must be taken for the full course to insure the infection is eliminated. If the infection came from a bad tooth, it may have to be extracted when the infection is under control. Warm compresses may be applied to reduce skin irritation and remove drainage. °You might need a tetanus shot now if: °· You cannot remember when your last tetanus shot was. °· You have never had a tetanus shot. °· The object that caused your wound was dirty. °If you need a tetanus shot, and you decide not to get one, there is a rare chance of getting tetanus. Sickness from tetanus can be serious. If you got a tetanus shot, your arm may swell, get red and warm to the touch at the shot site. This is common and not a problem. °SEEK IMMEDIATE MEDICAL CARE IF:  °· You have increased swelling, redness, or trouble breathing. °· You have a severe headache, dizziness, nausea, or vomiting. °· You develop problems with your eyesight. °· You have a fever. °Document Released: 10/28/2004 Document Revised: 12/13/2011 Document Reviewed: 09/20/2005 °ExitCare® Patient Information ©2014 [employer]. ° °

## 2013-06-02 NOTE — ED Provider Notes (Signed)
CSN: [id number]     Arrival date & time 06/02/13  1135 History   First MD Initiated Contact with Patient 06/02/13 1206     Chief Complaint  Patient presents with  . Facial Swelling   (Consider location/radiation/quality/duration/timing/severity/associated sxs/prior Treatment) HPI Comments: Pt was seen by Dr. Shelle Iron yesterday and put on augmentin, has had 2 doses thus far, but has extended some towards left corner of lip and upwards towards left lower border of eye.  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  She called and spoke to on call physician who referred her to be evaluated in the ED.  Pt was cleaning out her attic several days ago and may have been exposed to dust and thought she had flared up her allergies.    Patient is a 72 y.o. female presenting with rash. The history is provided by the patient.  Rash Location:  Face Facial rash location:  L cheek and nose Quality: painful, redness and swelling   Pain details:    Quality:  Burning, sore and aching   Severity:  Moderate   Onset quality:  Gradual   Duration:  3 days   Timing:  Constant   Progression:  Worsening Chronicity:  New Ineffective treatments:  Antibiotic cream and anti-itch cream Associated symptoms: periorbital edema   Associated symptoms: no fever, no myalgias, no nausea, no shortness of breath, no sore throat and not wheezing     Past Medical History  Diagnosis Date  . 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 History of blood clots     LEG  . Stroke   . Hernia   . MGUS (monoclonal gammopathy of unknown significance)   . Chronic kidney disease   . Hypertension   . Hyperlipidemia   . Thyroid disease   . Arthritis   . Mitral valve prolapse   . Anxiety   . Perforated bowel   . Peritonitis   . Diverticulosis     Pt reported on 03/15/12  . MGUS (monoclonal gammopathy of unknown significance)   . Cancer     THYROID, SKIN, NOSE, BONE  . Melanoma   . Melanoma 1989   chest   Past Surgical History  Procedure Laterality Date  . Abdominal hysterectomy    . Rotator cuff repair  2004    RIGHT SHOULDER  . Hernia repair    . Leg surgery      VEIN & BLOOD CLOT  . Cardiovascular stress test  2006    NORMAL  . Transthoracic echocardiogram  2006  . Melanoma excision      R eye  . Skin cancer excision  2011, 2013    squamous cell of nose x 2  . Breast lumpectomy  12/2010    R breast  . Breast lumpectomy  2004    L axilla neg for cancer.   Family History  Problem Relation Age of Onset  . Stroke Mother   . Asthma Father   . Diabetes Father   . Cancer Father   . Heart failure Father   . Cancer Sister   . Diabetes Sister   . Cancer Brother   . Cancer Father     Prostate and kidney cancer  . Stroke Mother 62    Her mother passed away from this stroke at the age of 57  . Cervical cancer Sister   . Cancer Sister     Recurrent cancer involving  the neck and the jaw   History  Substance Use Topics  . Smoking status: Never Smoker   . Smokeless tobacco: Never Used  . Alcohol Use: No   OB History   Grav Para Term Preterm Abortions TAB SAB Ect Mult Living                 Review of Systems  Constitutional: Negative for fever and chills.  HENT: Positive for facial swelling. Negative for sore throat, neck pain, neck stiffness and voice change.   Eyes: Negative for pain, redness and visual disturbance.  Respiratory: Negative for shortness of breath and wheezing.   Gastrointestinal: Negative for nausea.  Musculoskeletal: Negative for myalgias.  Skin: Positive for color change and rash.  All other systems reviewed and are negative.    Allergies  Aldactone; Hydrocodone; Levofloxacin; Pseudoephedrine; Quinolones; Sertraline; Sertraline hcl; Sudafed; Ultram; and Latex  Home Medications   Current Outpatient Rx  Name  Route  Sig  Dispense  Refill  . acetaminophen (TYLENOL) 325 MG tablet   Oral   Take 325 mg by mouth every 6 (six) hours as needed  for pain.         [name] albuterol (VENTOLIN HFA) 108 (90 BASE) MCG/ACT inhaler   Inhalation   Inhale 2 puffs into the lungs every 6 (six) hours as needed. For shortness of breath         . ALPRAZolam (XANAX) 0.5 MG tablet   Oral   Take 0.5 mg by mouth. Take 0.5 mg by mouth. Frequency:PRN   Dosage:0.5   MG  Instructions:  Note:Dose: 0.5MG          . amLODipine (NORVASC) 5 MG tablet   Oral   Take 1 tablet (5 mg total) by mouth daily.   90 tablet   3   . amoxicillin-clavulanate (AUGMENTIN) 875-125 MG per tablet   Oral   Take 1 tablet by mouth 2 (two) times daily. For 10 days.   20 tablet   0   . aspirin 81 MG chewable tablet   Oral   Chew 81 mg by mouth. Chew 81 mg. Frequency:QD   Dosage:81   MG  Instructions:  Note:Dose: 81MG          . Azelastine-Fluticasone (DYMISTA) 137-50 MCG/ACT SUSP   Nasal   Place 1 puff into the nose daily.         . calcium citrate-vitamin D (CITRACAL+D) 315-200 MG-UNIT per tablet   Oral   Take 1 tablet by mouth 2 (two) times daily.          . Cholecalciferol (VITAMIN D PO)   Oral   Take by mouth. Vitamin D3 1000 mg once a day by mouth         . clonazePAM (KLONOPIN) 0.5 MG tablet   Oral   Take 0.5 mg by mouth at bedtime as needed for anxiety.          . fexofenadine (ALLEGRA) 180 MG tablet   Oral   Take 90 mg by mouth daily.          . hydrochlorothiazide (HYDRODIURIL) 25 MG tablet   Oral   Take 12.5 mg by mouth daily. Takes 1/2 tablet daily or as directed         . levothyroxine (SYNTHROID, LEVOTHROID) 125 MCG tablet   Oral   Take 125 mcg by mouth daily.           [name] MICARDIS 40 MG tablet   Oral   Take  1 tablet (40 mg total) by mouth daily.   90 tablet   3     Dispense as written.   . mometasone (ASMANEX) 220 MCG/INH inhaler   Inhalation   Inhale 2 puffs into the lungs daily.         . simvastatin (ZOCOR) 20 MG tablet   Oral   Take 1 tablet (20 mg total) by mouth every evening.   90 tablet   3   .  doxycycline (VIBRAMYCIN) 100 MG capsule   Oral   Take 1 capsule (100 mg total) by mouth 2 (two) times daily.   20 capsule   0    BP 135/70  Pulse 66  Temp(Src) 98.2 F (36.8 C) (Oral)  Resp 16  SpO2 95% Physical Exam  Nursing note and vitals reviewed. Constitutional: She is oriented to person, place, and time. She appears well-developed and well-nourished. No distress.  HENT:  Head: Normocephalic and atraumatic.  Eyes: Conjunctivae and EOM are normal. Right eye exhibits no discharge. Left eye exhibits no discharge. No scleral icterus.  Neck: Normal range of motion. Neck supple.  Cardiovascular: Normal rate, regular rhythm and intact distal pulses.   Pulmonary/Chest: Effort normal. No respiratory distress. She has no wheezes.  Lymphadenopathy:    She has no cervical adenopathy.  Neurological: She is alert and oriented to person, place, and time. No cranial nerve deficit. Coordination normal.  Skin: Skin is warm. She is not diaphoretic.    ED Course  Procedures (including critical care time) Labs Review Labs Reviewed  BASIC METABOLIC PANEL - Abnormal; Notable for the following:    Potassium 3.4 (*)    Glucose, Bld 108 (*)    GFR calc non Af Amer 89 (*)    All other components within normal limits  CBC WITH DIFFERENTIAL   Imaging Review No results found.  RA sat is 97% and I interpret to be adequate.   Labs ok, WBC not elevated, no fever.  Not toxic appearing.  Pt discharged after IV clindamycin and added doxycycline to abx regimen.   MDM   1. Facial cellulitis      Pt with left facial cellulitis, not quote periorbital although some involvement of inferior edge of periorbital region.  Moreso involving left side of nose and extends down towards left cheek near corner of left lip.  No HA, no visual involvement, EOMI and no eye pain with movement.  No need for CT Scan at this point.  I reviewed recent prior outpt clinic notes.  Will give a dose of IV abx and add  doxycycline to her abx to cover for MRSA as well and she can otherwise continue augmentin.  She would prefer not to be admitted.  Will obtain CBC and BMP to establish a baseline.      Gavin Pound. Oletta Lamas, MD 06/03/13 [phone]

## 2013-06-02 NOTE — ED Notes (Signed)
Pt from home reports that she has had swelling of her L cheek since yesterday. Pt was seen at her PCP yesterday and was prescribed Augmentin for the swelling. P states that the swelling has worsened since yesterday. Pt is A&O and in NAD.

## 2013-06-03 ENCOUNTER — Emergency Department (HOSPITAL_COMMUNITY): Payer: Medicare Other

## 2013-06-03 ENCOUNTER — Encounter (HOSPITAL_COMMUNITY): Payer: Self-pay | Admitting: Emergency Medicine

## 2013-06-03 ENCOUNTER — Emergency Department (HOSPITAL_COMMUNITY)
Admission: EM | Admit: 2013-06-03 | Discharge: 2013-06-03 | Disposition: A | Discharge status: Home / Self Care | Payer: Medicare Other | Attending: Emergency Medicine | Admitting: Emergency Medicine

## 2013-06-03 DIAGNOSIS — Z91040 Latex allergy status: Secondary | ICD-10-CM | POA: Diagnosis not present

## 2013-06-03 DIAGNOSIS — Z86718 Personal history of other venous thrombosis and embolism: Secondary | ICD-10-CM | POA: Insufficient documentation

## 2013-06-03 DIAGNOSIS — Z8673 Personal history of transient ischemic attack (TIA), and cerebral infarction without residual deficits: Secondary | ICD-10-CM | POA: Diagnosis not present

## 2013-06-03 DIAGNOSIS — M129 Arthropathy, unspecified: Secondary | ICD-10-CM | POA: Insufficient documentation

## 2013-06-03 DIAGNOSIS — Z7982 Long term (current) use of aspirin: Secondary | ICD-10-CM | POA: Diagnosis not present

## 2013-06-03 DIAGNOSIS — M7989 Other specified soft tissue disorders: Secondary | ICD-10-CM | POA: Diagnosis not present

## 2013-06-03 DIAGNOSIS — IMO0002 Reserved for concepts with insufficient information to code with codable children: Secondary | ICD-10-CM | POA: Diagnosis not present

## 2013-06-03 DIAGNOSIS — E785 Hyperlipidemia, unspecified: Secondary | ICD-10-CM | POA: Insufficient documentation

## 2013-06-03 DIAGNOSIS — Z8679 Personal history of other diseases of the circulatory system: Secondary | ICD-10-CM | POA: Diagnosis not present

## 2013-06-03 DIAGNOSIS — R6884 Jaw pain: Secondary | ICD-10-CM | POA: Diagnosis not present

## 2013-06-03 DIAGNOSIS — Z8719 Personal history of other diseases of the digestive system: Secondary | ICD-10-CM | POA: Insufficient documentation

## 2013-06-03 DIAGNOSIS — Z8669 Personal history of other diseases of the nervous system and sense organs: Secondary | ICD-10-CM | POA: Diagnosis not present

## 2013-06-03 DIAGNOSIS — L0201 Cutaneous abscess of face: Secondary | ICD-10-CM | POA: Diagnosis not present

## 2013-06-03 DIAGNOSIS — N189 Chronic kidney disease, unspecified: Secondary | ICD-10-CM | POA: Insufficient documentation

## 2013-06-03 DIAGNOSIS — Z79899 Other long term (current) drug therapy: Secondary | ICD-10-CM | POA: Insufficient documentation

## 2013-06-03 DIAGNOSIS — Z85828 Personal history of other malignant neoplasm of skin: Secondary | ICD-10-CM | POA: Insufficient documentation

## 2013-06-03 DIAGNOSIS — L0390 Cellulitis, unspecified: Secondary | ICD-10-CM

## 2013-06-03 DIAGNOSIS — Z862 Personal history of diseases of the blood and blood-forming organs and certain disorders involving the immune mechanism: Secondary | ICD-10-CM | POA: Diagnosis not present

## 2013-06-03 DIAGNOSIS — I129 Hypertensive chronic kidney disease with stage 1 through stage 4 chronic kidney disease, or unspecified chronic kidney disease: Secondary | ICD-10-CM | POA: Insufficient documentation

## 2013-06-03 DIAGNOSIS — L03211 Cellulitis of face: Secondary | ICD-10-CM | POA: Insufficient documentation

## 2013-06-03 DIAGNOSIS — Z85830 Personal history of malignant neoplasm of bone: Secondary | ICD-10-CM | POA: Insufficient documentation

## 2013-06-03 DIAGNOSIS — E079 Disorder of thyroid, unspecified: Secondary | ICD-10-CM | POA: Insufficient documentation

## 2013-06-03 DIAGNOSIS — F411 Generalized anxiety disorder: Secondary | ICD-10-CM | POA: Insufficient documentation

## 2013-06-03 DIAGNOSIS — L539 Erythematous condition, unspecified: Secondary | ICD-10-CM | POA: Diagnosis not present

## 2013-06-03 DIAGNOSIS — Z8522 Personal history of malignant neoplasm of nasal cavities, middle ear, and accessory sinuses: Secondary | ICD-10-CM | POA: Diagnosis not present

## 2013-06-03 DIAGNOSIS — Z8639 Personal history of other endocrine, nutritional and metabolic disease: Secondary | ICD-10-CM | POA: Insufficient documentation

## 2013-06-03 DIAGNOSIS — Z85850 Personal history of malignant neoplasm of thyroid: Secondary | ICD-10-CM | POA: Diagnosis not present

## 2013-06-03 LAB — COMPREHENSIVE METABOLIC PANEL
Albumin: 3.7 g/dL (ref 3.5–5.2)
BUN: 17 mg/dL (ref 6–23)
Chloride: 102 mEq/L (ref 96–112)
Creatinine, Ser: 0.55 mg/dL (ref 0.50–1.10)
GFR calc Af Amer: >90 mL/min (ref >90)
GFR calc non Af Amer: >90 mL/min (ref >90)
Glucose, Bld: 98 mg/dL (ref 70–99)
Total Bilirubin: 0.6 mg/dL (ref 0.3–1.2)

## 2013-06-03 LAB — CBC WITH DIFFERENTIAL/PLATELET
Basophils Relative: 0 % (ref 0–1)
HCT: 36 % (ref 36.0–46.0)
Hemoglobin: 12.2 g/dL (ref 12.0–15.0)
Lymphs Abs: 1.8 10*3/uL (ref 0.7–4.0)
MCH: 29.8 pg (ref 26.0–34.0)
MCHC: 33.9 g/dL (ref 30.0–36.0)
Monocytes Absolute: 0.5 10*3/uL (ref 0.1–1.0)
Monocytes Relative: 9 % (ref 3–12)
Neutro Abs: 3.2 10*3/uL (ref 1.7–7.7)

## 2013-06-03 LAB — CG4 I-STAT (LACTIC ACID): Lactic Acid, Venous: 1.01 mmol/L (ref 0.5–2.2)

## 2013-06-03 MED ORDER — AMOXICILLIN-POT CLAVULANATE 875-125 MG PO TABS
1.0000 | ORAL_TABLET | Freq: Once | ORAL | Status: AC
  Administered 2013-06-03: 1 via ORAL
  Filled 2013-06-03: qty 1

## 2013-06-03 MED ORDER — DOXYCYCLINE HYCLATE 100 MG IV SOLR
100.0000 mg | Freq: Once | INTRAVENOUS | Status: AC
  Administered 2013-06-03: 100 mg via INTRAVENOUS
  Filled 2013-06-03: qty 100

## 2013-06-03 MED ORDER — SODIUM CHLORIDE 0.9 % IV BOLUS (SEPSIS)
1000.0000 mL | Freq: Once | INTRAVENOUS | Status: AC
  Administered 2013-06-03: 1000 mL via INTRAVENOUS

## 2013-06-03 MED ORDER — IOHEXOL 300 MG/ML  SOLN
100.0000 mL | Freq: Once | INTRAMUSCULAR | Status: AC | PRN
  Administered 2013-06-03: 100 mL via INTRAVENOUS

## 2013-06-03 NOTE — ED Provider Notes (Signed)
Addendum to note on 06/02/13.  Exam: HENT: Mild induration, erythema and tenderness to left face lateral to and involving left bridge of nose extending from below left eye, periorbital region to corner of left lip.  No stridor, MM moist.  Nasal passages clear.  EOMI.  Gavin Pound. Oletta Lamas, MD 06/03/13 1191

## 2013-06-03 NOTE — ED Notes (Signed)
Pt states she is a cancer pt and she is concerned with what was diagnosed as facial cellulitis and she has already lost her right eye cancer,  And the swelling is on left side of face,  Pt states she needs the on call doctor called and see what else can be done beside the po antibiotics

## 2013-06-03 NOTE — ED Notes (Signed)
Pt states she has swelling and redness to the left side of her face  Pt states she has cancer and has been undergoing treatment for that  Pt states she was seen here yesterday for same  Pt states her face is burning

## 2013-06-03 NOTE — ED Provider Notes (Signed)
CSN: [id number]     Arrival date & time 06/03/13  4098 History   First MD Initiated Contact with Patient 06/03/13 [phone]     Chief Complaint  Patient presents with  . Rash   (Consider location/radiation/quality/duration/timing/severity/associated sxs/prior Treatment) HPI Patient presents one day after her emergency department evaluation of rash, now with concerns of increasing symptoms. She states that over the past days she has developed focal erythema, with full sensation, pain sensation about the left medial maxillary area.  Onset was after she spent cleaning, with the subsequent development of pruritus in this area.  After she had the initial discomfort she applied topical steroid, topical antibiotic.  In the following days erythema developed.  There was no concurrent visual loss, objective fever, dyspnea.  There was nasal discomfort, increasing pain, increasing erythema. The patient was started on Augmentin by her primary care team.  With progression of disease yesterday she was referred here for further evaluation and management.  She states that following discharge yesterday she took a single oral dose of the expanded antibiotic regimen.  She did not take this mornings doses.  She states that in the interval she has had increasing discomfort focally about the area, with pressure sensation radiating anteriorly towards the lips. She denies extension of the redness or pain.  She repeatedly is concern for vision in her left eye.  She has a prior visual loss in her right eye. She denies new visual deficiency in the left eye, ocular pain. She states that her expectation is for CT scan. Past Medical History  Diagnosis Date  . 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 History of blood clots     LEG  . Stroke   . Hernia   . MGUS (monoclonal gammopathy of unknown significance)   . Chronic kidney disease   . Hypertension   . Hyperlipidemia   . Thyroid disease   . Arthritis   . Mitral valve  prolapse   . Anxiety   . Perforated bowel   . Peritonitis   . Diverticulosis     Pt reported on 03/15/12  . MGUS (monoclonal gammopathy of unknown significance)   . Cancer     THYROID, SKIN, NOSE, BONE  . Melanoma   . Melanoma 1989    chest   Past Surgical History  Procedure Laterality Date  . Abdominal hysterectomy    . Rotator cuff repair  2004    RIGHT SHOULDER  . Hernia repair    . Leg surgery      VEIN & BLOOD CLOT  . Cardiovascular stress test  2006    NORMAL  . Transthoracic echocardiogram  2006  . Melanoma excision      R eye  . Skin cancer excision  2011, 2013    squamous cell of nose x 2  . Breast lumpectomy  12/2010    R breast  . Breast lumpectomy  2004    L axilla neg for cancer.   Family History  Problem Relation Age of Onset  . Stroke Mother   . Asthma Father   . Diabetes Father   . Cancer Father   . Heart failure Father   . Cancer Sister   . Diabetes Sister   . Cancer Brother   . Cancer Father     Prostate and kidney cancer  . Stroke Mother 39    Her mother passed away from this stroke at the age of 24  . Cervical cancer  Sister   . Cancer Sister     Recurrent cancer involving the neck and the jaw   History  Substance Use Topics  . Smoking status: Never Smoker   . Smokeless tobacco: Never Used  . Alcohol Use: No   OB History   Grav Para Term Preterm Abortions TAB SAB Ect Mult Living                 Review of Systems  All other systems reviewed and are negative.    Allergies  Aldactone; Hydrocodone; Levofloxacin; Pseudoephedrine; Quinolones; Sertraline; Sertraline hcl; Sudafed; Ultram; Adhesive; and Latex  Home Medications   Current Outpatient Rx  Name  Route  Sig  Dispense  Refill  . amLODipine (NORVASC) 5 MG tablet   Oral   Take 1 tablet (5 mg total) by mouth daily.   90 tablet   3   . amoxicillin-clavulanate (AUGMENTIN) 875-125 MG per tablet   Oral   Take 1 tablet by mouth 2 (two) times daily. For 10 days.   20 tablet    0   . aspirin 81 MG chewable tablet   Oral   Chew 81 mg by mouth at bedtime.          . clonazePAM (KLONOPIN) 0.5 MG tablet   Oral   Take 0.25-0.5 mg by mouth at bedtime as needed for anxiety.          [name] doxycycline (VIBRAMYCIN) 100 MG capsule   Oral   Take 1 capsule (100 mg total) by mouth 2 (two) times daily.   20 capsule   0   . levothyroxine (SYNTHROID, LEVOTHROID) 125 MCG tablet   Oral   Take 125 mcg by mouth daily.           [name] MICARDIS 40 MG tablet   Oral   Take 1 tablet (40 mg total) by mouth daily.   90 tablet   3     Dispense as written.   . simvastatin (ZOCOR) 20 MG tablet   Oral   Take 1 tablet (20 mg total) by mouth every evening.   90 tablet   3   . acetaminophen (TYLENOL) 325 MG tablet   Oral   Take 325 mg by mouth every 6 (six) hours as needed for pain.         [name] albuterol (VENTOLIN HFA) 108 (90 BASE) MCG/ACT inhaler   Inhalation   Inhale 2 puffs into the lungs every 6 (six) hours as needed for wheezing or shortness of breath.          . ALPRAZolam (XANAX) 0.5 MG tablet   Oral   Take 0.5 mg by mouth 3 (three) times daily as needed for anxiety.          . Azelastine-Fluticasone (DYMISTA) 137-50 MCG/ACT SUSP   Nasal   Place 1 puff into the nose daily.         . calcium citrate-vitamin D (CITRACAL+D) 315-200 MG-UNIT per tablet   Oral   Take 1 tablet by mouth 2 (two) times daily.          . fexofenadine (ALLEGRA) 180 MG tablet   Oral   Take 90 mg by mouth daily.          . hydrochlorothiazide (HYDRODIURIL) 25 MG tablet   Oral   Take 12.5 mg by mouth daily.          . mometasone (ASMANEX) 220 MCG/INH inhaler   Inhalation   Inhale 2 puffs into  the lungs daily.          BP 111/79  Pulse 67  Temp(Src) 97.9 F (36.6 C) (Oral)  Resp 20  SpO2 94% Physical Exam  Nursing note and vitals reviewed. Constitutional: She is oriented to person, place, and time. She appears well-developed and well-nourished. No distress.    HENT:  Head: Normocephalic and atraumatic.    Nose: No rhinorrhea, sinus tenderness or nasal deformity. No epistaxis.  Mouth/Throat: Oropharynx is clear and moist.  Beyond the left facial skin lesion, there are no other gross abnormality  Eyes: Conjunctivae and EOM are normal. Right eye exhibits no discharge. Left eye exhibits no discharge. No scleral icterus.  Neck: Normal range of motion. Neck supple.  Cardiovascular: Normal rate, regular rhythm and intact distal pulses.   Pulmonary/Chest: Effort normal. No respiratory distress. She has no wheezes.  Lymphadenopathy:    She has no cervical adenopathy.  Neurological: She is alert and oriented to person, place, and time. No cranial nerve deficit. Coordination normal.  Skin: Skin is warm. She is not diaphoretic.    ED Course  Procedures (including critical care time) Labs Review Labs Reviewed  CBC WITH DIFFERENTIAL  COMPREHENSIVE METABOLIC PANEL   Imaging Review No results found. I reviewed yesterday's and prior notes.  O2- 99%ra, nml.   9:49 AM I reviewed results thus far with the patient.  On repeat exam the cutaneous lesion appears smaller.  She has no new complaints. MDM  No diagnosis found. Patient presents for second time in 2 days with concern of facial lesion.  With the patient's endorsement of increased pain, per request for CT scan, this was performed to rule out septal cellulitis.  Skin was reassuring, labs were reassuring, and absent fever, distress, and with the decrease in the erythema during her emergency department stay, she was appropriate for discharge with followup with her primary care physician in 2 days.    Gerhard Munch, MD 06/03/13 [phone]

## 2013-06-03 NOTE — ED Notes (Signed)
Pt will be discharged in 2 hours when vibramycin infusion is complete

## 2013-06-05 DIAGNOSIS — R220 Localized swelling, mass and lump, head: Secondary | ICD-10-CM | POA: Diagnosis not present

## 2013-06-05 DIAGNOSIS — L259 Unspecified contact dermatitis, unspecified cause: Secondary | ICD-10-CM | POA: Diagnosis not present

## 2013-06-05 DIAGNOSIS — L820 Inflamed seborrheic keratosis: Secondary | ICD-10-CM | POA: Diagnosis not present

## 2013-06-06 LAB — PROTEIN ELECTROPHORESIS, SERUM
Albumin ELP: 55.8 % (ref 55.8–66.1)
Alpha-1-Globulin: 3.7 % (ref 2.9–4.9)
Alpha-2-Globulin: 8.7 % (ref 7.1–11.8)
Beta Globulin: 6 % (ref 4.7–7.2)
M-Spike, %: 1.69 g/dL
Total Protein, Serum Electrophoresis: 7.4 g/dL (ref 6.0–8.3)

## 2013-06-06 LAB — KAPPA/LAMBDA LIGHT CHAINS
Kappa:Lambda Ratio: 0 — ABNORMAL LOW (ref 0.26–1.65)
Lambda Free Lght Chn: 38.1 mg/dL — ABNORMAL HIGH (ref 0.57–2.63)

## 2013-06-07 ENCOUNTER — Telehealth: Payer: Self-pay | Admitting: *Deleted

## 2013-06-07 NOTE — Telephone Encounter (Signed)
This RN spoke with pt per results of labs obtained last week.  Of note [name] also informed this RN of recent visits to the ER for facial cellulitis.  No other needs at this time.

## 2013-06-14 ENCOUNTER — Telehealth: Payer: Self-pay | Admitting: Family

## 2013-06-14 DIAGNOSIS — L0201 Cutaneous abscess of face: Secondary | ICD-10-CM | POA: Diagnosis not present

## 2013-06-14 NOTE — Telephone Encounter (Signed)
Spoke to [name] and let her know that her laboratories including SPEP,protein electrophoresis and kappa lambda light chains are all stable per Dr. Darnelle Catalan.  The patient voiced understanding

## 2013-06-19 DIAGNOSIS — L259 Unspecified contact dermatitis, unspecified cause: Secondary | ICD-10-CM | POA: Diagnosis not present

## 2013-06-19 DIAGNOSIS — L820 Inflamed seborrheic keratosis: Secondary | ICD-10-CM | POA: Diagnosis not present

## 2013-06-19 DIAGNOSIS — L723 Sebaceous cyst: Secondary | ICD-10-CM | POA: Diagnosis not present

## 2013-06-26 ENCOUNTER — Telehealth: Payer: Self-pay | Admitting: Critical Care Medicine

## 2013-06-26 NOTE — Telephone Encounter (Signed)
Pt c/o cough, chest congestion, hoarseness, cloudy phlegm, chest tightness all since Saturday. Pt has been taking OTC tylenol, delsym and sinus rinses without relief. Advised appt n needed. Appt set for AM with KC. Carron Curie, CMA

## 2013-06-27 ENCOUNTER — Ambulatory Visit (INDEPENDENT_AMBULATORY_CARE_PROVIDER_SITE_OTHER): Payer: Medicare Other | Admitting: Pulmonary Disease

## 2013-06-27 ENCOUNTER — Encounter: Payer: Self-pay | Admitting: Pulmonary Disease

## 2013-06-27 VITALS — BP 110/70 | HR 69 | Temp 96.8°F | Ht 61.0 in | Wt 127.2 lb

## 2013-06-27 DIAGNOSIS — J45901 Unspecified asthma with (acute) exacerbation: Secondary | ICD-10-CM | POA: Diagnosis not present

## 2013-06-27 DIAGNOSIS — J45909 Unspecified asthma, uncomplicated: Secondary | ICD-10-CM

## 2013-06-27 MED ORDER — METHYLPREDNISOLONE 16 MG PO TABS: ORAL_TABLET | ORAL | Status: DC

## 2013-06-27 MED ORDER — AZELASTINE-FLUTICASONE 137-50 MCG/ACT NA SUSP: 1.0000 | Freq: Every day | NASAL | Status: DC

## 2013-06-27 NOTE — Patient Instructions (Addendum)
Ok to stop your fluticasone, and we will send in prescription for dymista since you feel this works better for you. Will treat with a short course of medrol to help your breathing. No other change in your maintenance medications.  followup with me Dr. Delford Field in 2-3 weeks to make sure you are doing ok.

## 2013-06-27 NOTE — Assessment & Plan Note (Signed)
The patient is having increasing postnasal drip, and now feels that she has increased shortness of breath with restriction to air flow. She may be developing an early asthma exacerbation, possibly related to increased allergies and the cool damp weather.  There is nothing by her history or exam to see just a sinopulmonary infection.  Will treat her with a short course of steroids, and we'll change her fluticasone to dymista since she feels she does better with this.

## 2013-06-27 NOTE — Addendum Note (Signed)
Addended by: Maisie Fus on: 06/27/2013 10:29 AM   Modules accepted: Orders

## 2013-06-27 NOTE — Progress Notes (Signed)
  Subjective:    Patient ID: [name], female    DOB: 1941-09-20, 72 y.o.   MRN: [id number]  HPI The patient comes in today for an acute sick visit.  She has known asthma, and gives a one-week history of increasing postnasal drip, cough with green mucus, and increasing chest congestion with restriction to air flow.  She has not had any purulent mucus or fever.  This has resulted in increasing shortness of breath most recently.  She is having more postnasal drip, and thinks she did better on dymista  than she did on fluticasone.   Review of Systems  Constitutional: Positive for fever. Negative for unexpected weight change.  HENT: Negative for ear pain, nosebleeds, congestion, sore throat, rhinorrhea, sneezing, trouble swallowing, dental problem, postnasal drip and sinus pressure.   Eyes: Negative for redness and itching.  Respiratory: Positive for cough and shortness of breath. Negative for chest tightness and wheezing.   Cardiovascular: Positive for chest pain. Negative for palpitations and leg swelling.  Gastrointestinal: Negative for nausea and vomiting.  Genitourinary: Negative for dysuria.  Musculoskeletal: Negative for joint swelling.  Skin: Negative for rash.  Neurological: Negative for headaches.  Hematological: Does not bruise/bleed easily.  Psychiatric/Behavioral: Negative for dysphoric mood. The patient is not nervous/anxious.        Objective:   Physical Exam Frail-appearing female in no acute distress Nose without purulence or discharge noted Oropharynx clear Neck without lymphadenopathy or thyromegaly Chest with fairly clear breath sounds, excellent air flow. Cardiac exam with regular rate and rhythm Lower extremities without significant edema, no cyanosis Alert and oriented, moves all 4 extremities.       Assessment & Plan:

## 2013-07-09 ENCOUNTER — Ambulatory Visit (INDEPENDENT_AMBULATORY_CARE_PROVIDER_SITE_OTHER): Payer: Medicare Other | Admitting: Critical Care Medicine

## 2013-07-09 ENCOUNTER — Encounter: Payer: Self-pay | Admitting: Critical Care Medicine

## 2013-07-09 VITALS — BP 110/60 | HR 72 | Temp 97.7°F | Ht 61.0 in | Wt 128.0 lb

## 2013-07-09 DIAGNOSIS — J309 Allergic rhinitis, unspecified: Secondary | ICD-10-CM

## 2013-07-09 DIAGNOSIS — J45909 Unspecified asthma, uncomplicated: Secondary | ICD-10-CM | POA: Diagnosis not present

## 2013-07-09 DIAGNOSIS — J019 Acute sinusitis, unspecified: Secondary | ICD-10-CM | POA: Diagnosis not present

## 2013-07-09 MED ORDER — FEXOFENADINE HCL 180 MG PO TABS: 180.0000 mg | ORAL_TABLET | Freq: Every day | ORAL | Status: DC

## 2013-07-09 MED ORDER — AMOXICILLIN-POT CLAVULANATE 875-125 MG PO TABS: 1.0000 | ORAL_TABLET | Freq: Two times a day (BID) | ORAL | Status: DC

## 2013-07-09 MED ORDER — AZELASTINE-FLUTICASONE 137-50 MCG/ACT NA SUSP: 1.0000 | Freq: Two times a day (BID) | NASAL | Status: DC

## 2013-07-09 NOTE — Patient Instructions (Addendum)
Take augmentin one twice daily for 7days Increase dymista to one puff twice daily Increase allegra 180mg  daily No other medication changes Return 2 months [place]

## 2013-07-09 NOTE — Assessment & Plan Note (Signed)
Moderate persistent asthma with cough variation but no evidence of asthma flare at this time Acute allergic rhinitis with mild early sinusitis Plan Take augmentin one twice daily for 7days Increase dymista to one puff twice daily Increase allegra 180mg  daily No other medication changes Return 2 months [place]

## 2013-07-09 NOTE — Progress Notes (Signed)
Subjective:    Patient ID: [name], female    DOB: 20-Apr-1941, 73 y.o.   MRN: [id number]  HPI   72 y.o. WF  never smoker with  H/o asthma/ allergies to dust mold  and referred by Dr Clarene Duke 10/21/2011 for pulmonary evaluation for difficult to control asthma   07/09/2013 Chief Complaint  Patient presents with  . Acute office visit    Sorethroat for 2 weeks - Chest congestion - Sinus drainage - Prod cough (clear - tan) - Increased congestoin in throat - Hoarseness - Chest soreness   Pt seen twice by KC in 06/2013 Last ov 9/24>>pred pulse and changed to dymista Sore throat comes and goes since. Pt notes some pndrip. Choking sensation.  Hoarse. Mucus now is tan and white.  Nasal drainage and is blowing out of the nose.  Pt denies wheezing    Past Medical History  Diagnosis Date  . 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 History of blood clots     LEG  . Stroke   . Hernia   . MGUS (monoclonal gammopathy of unknown significance)   . Chronic kidney disease   . Hypertension   . Hyperlipidemia   . Thyroid disease   . Arthritis   . Mitral valve prolapse   . Anxiety   . Perforated bowel   . Peritonitis   . Diverticulosis     Pt reported on 03/15/12  . MGUS (monoclonal gammopathy of unknown significance)   . Cancer     THYROID, SKIN, NOSE, BONE  . Melanoma   . Melanoma 1989    chest     Family History  Problem Relation Age of Onset  . Stroke Mother   . Asthma Father   . Diabetes Father   . Cancer Father   . Heart failure Father   . Cancer Sister   . Diabetes Sister   . Cancer Brother   . Cancer Father     Prostate and kidney cancer  . Stroke Mother 57    Her mother passed away from this stroke at the age of 20  . Cervical cancer Sister   . Cancer Sister     Recurrent cancer involving the neck and the jaw     History   Social History  . Marital Status: Divorced    Spouse Name: N/A    Number of Children: 2  . Years of Education: N/A   Occupational  History  . Retired     Holiday representative   Social History Main Topics  . Smoking status: Never Smoker   . Smokeless tobacco: Never Used  . Alcohol Use: No  . Drug Use: No  . Sexual Activity: No   Other Topics Concern  . Not on file   Social History Narrative  . No narrative on file     Allergies  Allergen Reactions  . Doxycycline     SEVERE HEADACHES  . Aldactone [Spironolactone]     Burning in stomach Burning in stomach  . Hydrocodone   . Levofloxacin   . Pseudoephedrine   . Quinolones   . Sertraline   . Sertraline Hcl   . Sudafed [Pseudoephedrine Hcl]   . Ultram [Tramadol]     Headaches  Headaches   . Adhesive [Tape] Rash  . Latex Rash     Outpatient Prescriptions Prior to Visit  Medication Sig Dispense Refill  . acetaminophen (TYLENOL) 325 MG tablet Take 325 mg by mouth every  6 (six) hours as needed for pain.      [name] albuterol (VENTOLIN HFA) 108 (90 BASE) MCG/ACT inhaler Inhale 2 puffs into the lungs every 6 (six) hours as needed for wheezing or shortness of breath.       . ALPRAZolam (XANAX) 0.5 MG tablet Take 0.5 mg by mouth 3 (three) times daily as needed for anxiety.       [name] amLODipine (NORVASC) 5 MG tablet Take 1 tablet (5 mg total) by mouth daily.  90 tablet  3  . aspirin 81 MG chewable tablet Chew 81 mg by mouth at bedtime.       . calcium citrate-vitamin D (CITRACAL+D) 315-200 MG-UNIT per tablet Take 1 tablet by mouth 2 (two) times daily.       . cholecalciferol (VITAMIN D) 1000 UNITS tablet Take 1,000 Units by mouth daily.      . clonazePAM (KLONOPIN) 0.5 MG tablet Take 0.25-0.5 mg by mouth at bedtime as needed for anxiety.       . hydrochlorothiazide (HYDRODIURIL) 25 MG tablet Take 12.5 mg by mouth daily.       [name] levothyroxine (SYNTHROID, LEVOTHROID) 125 MCG tablet Take 125 mcg by mouth daily.        [name] MICARDIS 40 MG tablet Take 1 tablet (40 mg total) by mouth daily.  90 tablet  3  . mometasone (ASMANEX) 220 MCG/INH inhaler Inhale 2 puffs into the lungs  daily.      . simvastatin (ZOCOR) 20 MG tablet Take 1 tablet (20 mg total) by mouth every evening.  90 tablet  3  . Azelastine-Fluticasone (DYMISTA) 137-50 MCG/ACT SUSP Place 1 puff into the nose daily.  23 g  11  . fexofenadine (ALLEGRA) 180 MG tablet Take 90 mg by mouth daily.       [name] loratadine (CLARITIN) 10 MG tablet Take 10 mg by mouth daily as needed for allergies.      . methylPREDNISolone (MEDROL) 16 MG tablet Take 2 tabs x 2 days, 1 x 2 days, then 1/2 x 2 days and stop.  7 tablet  0   No facility-administered medications prior to visit.       Review of Systems  Constitutional:   No  weight loss, night sweats,  Fevers, chills, fatigue, lassitude. HEENT:   No headaches,  Difficulty swallowing,  Tooth/dental problems,  +++Sore throat,                No sneezing, itching, ear ache,+++ nasal congestion, +++post nasal drip,   CV:  No chest pain,  Orthopnea, PND, swelling in lower extremities, anasarca, dizziness, palpitations  GI  No heartburn, indigestion, abdominal pain, nausea, vomiting, diarrhea, change in bowel habits, loss of appetite  Resp: Notes shortness of breath with exertion not at rest.  No wheezing.  No chest wall deformity  Skin: no rash or lesions.  GU: no dysuria, change in color of urine, no urgency or frequency.  No flank pain.  MS:  No joint pain or swelling.  No decreased range of motion.  No back pain.  Psych:  No change in mood or affect. No depression or anxiety.  No memory loss.     Objective:   Physical Exam  Filed Vitals:   07/09/13 0921  BP: 110/60  Pulse: 72  Temp: 97.7 F (36.5 C)  TempSrc: Oral  Height: 5\' 1"  (1.549 m)  Weight: 128 lb (58.06 kg)  SpO2: 95%    Gen: Pleasant, well-nourished, in no distress,  normal  affect  ENT: No lesions,  mouth clear,  oropharynx clear,no purulence in nares  Neck: No JVD, no TMG, no carotid bruits  Lungs: No use of accessory muscles, no dullness to percussion, distant breath  sounds  Cardiovascular: RRR, heart sounds normal, no murmur or gallops, no peripheral edema  Abdomen: soft and NT, no HSM,  BS normal  Musculoskeletal: No deformities, no cyanosis or clubbing  Neuro: alert, non focal  Skin: Warm, no lesions or rashes     Assessment & Plan:   Moderate persistent asthma with primary cough variation Moderate persistent asthma with cough variation but no evidence of asthma flare at this time Acute allergic rhinitis with mild early sinusitis Plan Take augmentin one twice daily for 7days Increase dymista to one puff twice daily Increase allegra 180mg  daily No other medication changes Return 2 months [place]    Updated Medication List Outpatient Encounter Prescriptions as of 07/09/2013  Medication Sig Dispense Refill  . acetaminophen (TYLENOL) 325 MG tablet Take 325 mg by mouth every 6 (six) hours as needed for pain.      [name] albuterol (VENTOLIN HFA) 108 (90 BASE) MCG/ACT inhaler Inhale 2 puffs into the lungs every 6 (six) hours as needed for wheezing or shortness of breath.       . ALPRAZolam (XANAX) 0.5 MG tablet Take 0.5 mg by mouth 3 (three) times daily as needed for anxiety.       [name] amLODipine (NORVASC) 5 MG tablet Take 1 tablet (5 mg total) by mouth daily.  90 tablet  3  . aspirin 81 MG chewable tablet Chew 81 mg by mouth at bedtime.       . Azelastine-Fluticasone (DYMISTA) 137-50 MCG/ACT SUSP Place 1 puff into the nose 2 (two) times daily.  23 g  11  . calcium citrate-vitamin D (CITRACAL+D) 315-200 MG-UNIT per tablet Take 1 tablet by mouth 2 (two) times daily.       . cholecalciferol (VITAMIN D) 1000 UNITS tablet Take 1,000 Units by mouth daily.      . clonazePAM (KLONOPIN) 0.5 MG tablet Take 0.25-0.5 mg by mouth at bedtime as needed for anxiety.       . fexofenadine (ALLEGRA) 180 MG tablet Take 1 tablet (180 mg total) by mouth daily.      . hydrochlorothiazide (HYDRODIURIL) 25 MG tablet Take 12.5 mg by mouth daily.       [name] levothyroxine  (SYNTHROID, LEVOTHROID) 125 MCG tablet Take 125 mcg by mouth daily.        [name] MICARDIS 40 MG tablet Take 1 tablet (40 mg total) by mouth daily.  90 tablet  3  . mometasone (ASMANEX) 220 MCG/INH inhaler Inhale 2 puffs into the lungs daily.      . simvastatin (ZOCOR) 20 MG tablet Take 1 tablet (20 mg total) by mouth every evening.  90 tablet  3  . [DISCONTINUED] Azelastine-Fluticasone (DYMISTA) 137-50 MCG/ACT SUSP Place 1 puff into the nose daily.  23 g  11  . [DISCONTINUED] fexofenadine (ALLEGRA) 180 MG tablet Take 90 mg by mouth daily.       . [DISCONTINUED] loratadine (CLARITIN) 10 MG tablet Take 10 mg by mouth daily as needed for allergies.      [name] amoxicillin-clavulanate (AUGMENTIN) 875-125 MG per tablet Take 1 tablet by mouth 2 (two) times daily.  14 tablet  0  . [DISCONTINUED] methylPREDNISolone (MEDROL) 16 MG tablet Take 2 tabs x 2 days, 1 x 2 days, then 1/2 x 2 days and stop.  7 tablet  0   No facility-administered encounter medications on file as of 07/09/2013.

## 2013-07-11 DIAGNOSIS — J329 Chronic sinusitis, unspecified: Secondary | ICD-10-CM | POA: Diagnosis not present

## 2013-07-20 ENCOUNTER — Ambulatory Visit: Payer: Medicare Other | Admitting: Critical Care Medicine

## 2013-07-23 ENCOUNTER — Telehealth: Payer: Self-pay | Admitting: Critical Care Medicine

## 2013-07-23 MED ORDER — PREDNISONE 10 MG PO TABS: ORAL_TABLET | ORAL | Status: DC

## 2013-07-23 NOTE — Telephone Encounter (Signed)
I spoke with pt. She wanted to make sure it is okay to take the ABX with the prednisone. I advised her pt's are on both together as treatment. She reports she did not know that. Needed nothing further

## 2013-07-23 NOTE — Telephone Encounter (Signed)
Pt last seen PW on 07/09/13 and was given Augmentin.  This was completed and pt is now on another course of Augmentin that was given by primary md.  Still c/o chest tightness, sob, prod cough (white-yellow).  Has taken 2 bottles of Delsym.  Pt thinks a short course of Prednisone will help.  Please advise

## 2013-07-23 NOTE — Telephone Encounter (Signed)
i am ok with prednisone 10mg  Take 4 for two days three for two days two for two days one for two days  #20

## 2013-07-23 NOTE — Telephone Encounter (Signed)
I called and spoke with pt. Aware of recs. RX sent

## 2013-07-24 ENCOUNTER — Telehealth: Payer: Self-pay | Admitting: Critical Care Medicine

## 2013-07-24 NOTE — Telephone Encounter (Signed)
Called, spoke with pt - Yesterday prednisone was called in for Take 4 tabs daily x 2 days, 3 tabs daily x 2 days, 2 tabs daily x 2 days, 1 tab daily x 2 days and stop.  Pt states she only took 3 tablets yesterday because it was later when she picked it up.  She took it at approx 1:30 pm.  Pt states last night she couldn't sleep and had "severe HA across my crown" and chest was pounding.  Denies any blurred vision or dizziness.  Pt states the prednisone really helped her yesterday, and she doesn't want to stop it.  She would like to know if there is a way to decrease the dosage but still be effective for her.  Dr. Delford Field, pls advise.  Thank you.

## 2013-07-24 NOTE — Telephone Encounter (Signed)
I spoke with pt and is aware of recs. Nothing further needed 

## 2013-07-24 NOTE — Telephone Encounter (Signed)
Reduce to two daily for 4 days then 1 1/2 daily for 4days then 1 daily for 4 days and stop

## 2013-07-24 NOTE — Telephone Encounter (Signed)
lmomtcb x1 for pt on VM 

## 2013-07-25 DIAGNOSIS — IMO0002 Reserved for concepts with insufficient information to code with codable children: Secondary | ICD-10-CM | POA: Diagnosis not present

## 2013-08-03 ENCOUNTER — Telehealth: Payer: Self-pay | Admitting: Critical Care Medicine

## 2013-08-03 NOTE — Telephone Encounter (Signed)
Called and spoke with pt and she stated that she went off of her prednisone yesterday, still having the nasal congestion and she says that she is still not better.  Pt stated that she has appt with Dr. Patty Sermons on Monday but would like to know what PW recs would be on her getting the flu vaccine.  Pt is aware that PW  Is off today.  She requested that we ask KC, but KC is off today as well.  Pt stated that she will just check with Dr. Patty Sermons on Monday about the flu vaccine.

## 2013-08-06 ENCOUNTER — Encounter: Payer: Self-pay | Admitting: Cardiology

## 2013-08-06 ENCOUNTER — Telehealth: Payer: Self-pay | Admitting: Cardiology

## 2013-08-06 ENCOUNTER — Ambulatory Visit (INDEPENDENT_AMBULATORY_CARE_PROVIDER_SITE_OTHER): Payer: Medicare Other | Admitting: Cardiology

## 2013-08-06 VITALS — BP 118/64 | HR 68 | Ht 61.0 in | Wt 129.8 lb

## 2013-08-06 DIAGNOSIS — Z23 Encounter for immunization: Secondary | ICD-10-CM

## 2013-08-06 DIAGNOSIS — D472 Monoclonal gammopathy: Secondary | ICD-10-CM

## 2013-08-06 DIAGNOSIS — E7800 Pure hypercholesterolemia, unspecified: Secondary | ICD-10-CM

## 2013-08-06 DIAGNOSIS — I119 Hypertensive heart disease without heart failure: Secondary | ICD-10-CM

## 2013-08-06 DIAGNOSIS — Z0000 Encounter for general adult medical examination without abnormal findings: Secondary | ICD-10-CM

## 2013-08-06 DIAGNOSIS — C439 Malignant melanoma of skin, unspecified: Secondary | ICD-10-CM

## 2013-08-06 DIAGNOSIS — J45909 Unspecified asthma, uncomplicated: Secondary | ICD-10-CM

## 2013-08-06 DIAGNOSIS — E039 Hypothyroidism, unspecified: Secondary | ICD-10-CM

## 2013-08-06 LAB — HEPATIC FUNCTION PANEL
ALT: 25 U/L (ref 0–35)
AST: 23 U/L (ref 0–37)
Alkaline Phosphatase: 33 U/L — ABNORMAL LOW (ref 39–117)
Bilirubin, Direct: 0 mg/dL (ref 0.0–0.3)
Total Bilirubin: 1 mg/dL (ref 0.3–1.2)
Total Protein: 7.5 g/dL (ref 6.0–8.3)

## 2013-08-06 LAB — BASIC METABOLIC PANEL
BUN: 14 mg/dL (ref 6–23)
Creatinine, Ser: 0.7 mg/dL (ref 0.4–1.2)
GFR: 93 mL/min (ref >60.00)
Glucose, Bld: 90 mg/dL (ref 70–99)

## 2013-08-06 LAB — LIPID PANEL: Cholesterol: 176 mg/dL (ref 0–200)

## 2013-08-06 NOTE — Progress Notes (Signed)
[name] Date of Birth:  12-31-1940 [address] [place], [place]  [address] [phone]         Fax   [phone]  History of Present Illness: This pleasant 72 year old woman is seen for followup office visit. She has a past history of essential hypertension. She also has a past history of hypercholesterolemia and a past history of melanoma as well as thyroid cancer. She has had previous atypical chest pain. She had a normal nuclear stress test in 2006. Since last visit she has had several new problems. 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 She is now being followed for this by Dr. Marissa Calamity at [facility].  Consideration of chemotherapy has been muted because of awareness that she also has melanoma which might become more aggressive in the face of chemotherapy.   Current Outpatient Prescriptions  Medication Sig Dispense Refill  . acetaminophen (TYLENOL) 325 MG tablet Take 325 mg by mouth every 6 (six) hours as needed for pain.      [name] albuterol (VENTOLIN HFA) 108 (90 BASE) MCG/ACT inhaler Inhale 2 puffs into the lungs every 6 (six) hours as needed for wheezing or shortness of breath.       . ALPRAZolam (XANAX) 0.5 MG tablet Take 0.5 mg by mouth 3 (three) times daily as needed for anxiety.       [name] amLODipine (NORVASC) 5 MG tablet Take 1 tablet (5 mg total) by mouth daily.  90 tablet  3  . aspirin 81 MG chewable tablet Chew 81 mg by mouth at bedtime.       . Azelastine-Fluticasone (DYMISTA) 137-50 MCG/ACT SUSP Place 1 puff into the nose 2 (two) times daily.  23 g  11  . calcium citrate-vitamin D (CITRACAL+D) 315-200 MG-UNIT per tablet Take 1 tablet by mouth 2 (two) times daily.       . cholecalciferol (VITAMIN D) 1000 UNITS tablet Take 1,000 Units by mouth daily.      . clonazePAM (KLONOPIN) 0.5 MG tablet Take 0.25-0.5 mg by mouth at bedtime as needed for anxiety.       . fexofenadine (ALLEGRA) 180 MG tablet Take 1 tablet (180 mg total) by mouth daily.       . fluticasone (CUTIVATE) 0.05 % cream       . hydrochlorothiazide (HYDRODIURIL) 25 MG tablet Take 12.5 mg by mouth daily.       [name] levothyroxine (SYNTHROID, LEVOTHROID) 125 MCG tablet Take 125 mcg by mouth daily.        [name] MICARDIS 40 MG tablet Take 1 tablet (40 mg total) by mouth daily.  90 tablet  3  . mometasone (ASMANEX) 220 MCG/INH inhaler Inhale 2 puffs into the lungs daily.      . simvastatin (ZOCOR) 20 MG tablet Take 1 tablet (20 mg total) by mouth every evening.  90 tablet  3   No current facility-administered medications for this visit.    Allergies  Allergen Reactions  . Doxycycline     SEVERE HEADACHES  . Aldactone [Spironolactone]     Burning in stomach Burning in stomach  . Hydrocodone   . Levofloxacin   . Pseudoephedrine   . Quinolones   . Sertraline   . Sertraline Hcl   . Sudafed [Pseudoephedrine Hcl]   . Ultram [Tramadol]     Headaches  Headaches   . Adhesive [Tape] Rash  . Latex Rash    Patient Active Problem List   Diagnosis  Date Noted  . MGUS (monoclonal gammopathy of unknown significance) 11/26/2011  . Moderate persistent asthma with primary cough variation 09/21/2011  . Benign hypertensive heart disease without heart failure 03/31/2011  . Hypercholesterolemia 03/31/2011  . Hypothyroidism 03/31/2011  . History of melanoma 03/31/2011  . Hx of thyroid cancer 03/31/2011    History  Smoking status  . Never Smoker   Smokeless tobacco  . Never Used    History  Alcohol Use No    Family History  Problem Relation Age of Onset  . Stroke Mother   . Asthma Father   . Diabetes Father   . Cancer Father   . Heart failure Father   . Cancer Sister   . Diabetes Sister   . Cancer Brother   . Cancer Father     Prostate and kidney cancer  . Stroke Mother 56    Her mother passed away from this stroke at the age of 64  . Cervical cancer Sister   . Cancer Sister     Recurrent cancer involving the neck and the jaw    Review of  Systems: Constitutional: no fever chills diaphoresis or fatigue or change in weight.  Head and neck: no hearing loss, no epistaxis, no photophobia or visual disturbance. Respiratory: No cough, shortness of breath or wheezing. Cardiovascular: No chest pain peripheral edema, palpitations. Gastrointestinal: No abdominal distention, no abdominal pain, no change in bowel habits hematochezia or melena. Genitourinary: No dysuria, no frequency, no urgency, no nocturia. Musculoskeletal:No arthralgias, no back pain, no gait disturbance or myalgias. Neurological: No dizziness, no headaches, no numbness, no seizures, no syncope, no weakness, no tremors. Hematologic: No lymphadenopathy, no easy bruising. Psychiatric: No confusion, no hallucinations, no sleep disturbance.    Physical Exam: Filed Vitals:   08/06/13 0929  BP: 118/64  Pulse: 68   the general appearance reveals a well-developed well-nourished elderly woman in no distress.The head and neck exam reveals pupils equal and reactive.  Extraocular movements are full.  There is no scleral icterus.  The mouth and pharynx are normal.  The neck is supple.  The carotids reveal no bruits.  The jugular venous pressure is normal.  The  thyroid is not enlarged.  There is no lymphadenopathy.  The chest is clear to percussion and auscultation.  There are no rales or rhonchi.  Expansion of the chest is symmetrical.  The precordium is quiet.  The first heart sound is normal.  The second heart sound is physiologically split.  There is no murmur gallop rub or click.  There is no abnormal lift or heave.  The abdomen is soft and nontender.  The bowel sounds are normal.  The liver and spleen are not enlarged.  There are no abdominal masses.  There are no abdominal bruits.  Extremities reveal good pedal pulses.  There is no phlebitis or edema.  There is no cyanosis or clubbing.  Strength is normal and symmetrical in all extremities.  There is no lateralizing weakness.   There are no sensory deficits.  The skin is warm and dry.  There is no rash.   Assessment / Plan: Continue on same medication.  Await today's labs works.  Return in 4 months for office visit lipid panel hepatic function panel and basal metabolic panel. She will continue close followup at [facility] with Dr. 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

## 2013-08-06 NOTE — Patient Instructions (Signed)
Will obtain labs today and call you with the results (lp/bmet/hfp)  Your physician recommends that you continue on your current medications as directed. Please refer to the Current Medication list given to you today.  Your physician recommends that you schedule a follow-up appointment in: 4 months with fasting labs (lp/bmet/hfp)   OK FOR THE FLU VACCINE

## 2013-08-06 NOTE — Assessment & Plan Note (Signed)
The patient is clinically euthyroid. 

## 2013-08-06 NOTE — Assessment & Plan Note (Signed)
Patient has had a lot of respiratory symptoms over the course of the summer and fall.  Currently she is afebrile and not wheezing.  She is requesting a flu shot.  We will go ahead and give her the flu shot today.

## 2013-08-06 NOTE — Assessment & Plan Note (Signed)
Blood pressure has been remaining stable on current therapy.  No headaches dizziness or syncope.  No awareness of palpitations or tachycardia

## 2013-08-06 NOTE — Assessment & Plan Note (Signed)
Patient has a history of hypercholesterolemia.  She is on low-dose simvastatin 20 mg daily.  No myalgias.

## 2013-08-06 NOTE — Telephone Encounter (Signed)
New Problem:  Pt called in to schedule a 4 month f/u from today. Pt is requesting blood work. I did not see any lab orders. Please advise if pt needs labs. If so, please schedule for 12/10/13 at 8:40 or any time before 9am. Thanks

## 2013-08-07 NOTE — Telephone Encounter (Signed)
Follow up    Returned a nurses call regarding test results.

## 2013-08-07 NOTE — Telephone Encounter (Signed)
Left message to call back  Notes Recorded by Cassell Clement, MD on 08/07/2013 at 9:02 AM Please report to patient. The recent labs are stable. Continue same medication and careful diet.

## 2013-08-07 NOTE — Progress Notes (Signed)
Quick Note:  Please report to patient. The recent labs are stable. Continue same medication and careful diet. ______ 

## 2013-08-08 NOTE — Telephone Encounter (Signed)
Advised patient of lab results  

## 2013-08-09 DIAGNOSIS — F411 Generalized anxiety disorder: Secondary | ICD-10-CM | POA: Diagnosis not present

## 2013-08-24 DIAGNOSIS — M169 Osteoarthritis of hip, unspecified: Secondary | ICD-10-CM | POA: Diagnosis not present

## 2013-08-24 DIAGNOSIS — M1610 Unilateral primary osteoarthritis, unspecified hip: Secondary | ICD-10-CM | POA: Diagnosis not present

## 2013-08-24 DIAGNOSIS — C9000 Multiple myeloma not having achieved remission: Secondary | ICD-10-CM | POA: Diagnosis not present

## 2013-08-24 DIAGNOSIS — M948X9 Other specified disorders of cartilage, unspecified sites: Secondary | ICD-10-CM | POA: Diagnosis not present

## 2013-08-24 DIAGNOSIS — M19019 Primary osteoarthritis, unspecified shoulder: Secondary | ICD-10-CM | POA: Diagnosis not present

## 2013-09-04 ENCOUNTER — Telehealth: Payer: Self-pay | Admitting: *Deleted

## 2013-09-04 NOTE — Telephone Encounter (Signed)
Patient calling in today to see if Dr Darnelle Catalan has heard anything from Dr Dorothea Ogle regarding her visit with him on 08/24/13. She would like Dr Darnelle Catalan to review the visit and call her back, she has not heard anything about results from [facility]. Encouraged patient to call [facility] back in regards to questions regarding that visit. Will gather information for Dr Darnelle Catalan to review, patient is very anxious about starting any treatment that may suppress her system and allow or give way to a Melanoma recurrence. She said they want to start her on a "chemo pill". Per Dr Dorothea Ogle dictation, suggested to start Lenalidomide/Dex in 1-58months. I was able to quickly review and reassured patient that the bone survey done at [facility] showed "No lytic or blastic lesions", this did help calm patient, but would still like the input from Dr Darnelle Catalan when he gets a chance in the next few days.

## 2013-09-06 ENCOUNTER — Telehealth: Payer: Self-pay | Admitting: *Deleted

## 2013-09-06 ENCOUNTER — Other Ambulatory Visit: Payer: Self-pay | Admitting: Oncology

## 2013-09-06 NOTE — Telephone Encounter (Signed)
SPOKE TO DR.MAGRINAT'S NURSE, VAL DODD,RN. PT.'S CHEMOTHERAPY WILL BE A PILL CALLED REVLIMID. THIS MEDICATION IS UNDER MEDICARE PART D. PT. WILL SEE A FINANCIAL COUNSELOR THE SAME DAY AS HER APPOINTMENT WITH DR.MAGRINAT. NOTIFIED PT. OF THE ABOVE INFORMATION. SHE VOICES UNDERSTANDING.

## 2013-09-08 ENCOUNTER — Encounter: Payer: Self-pay | Admitting: Oncology

## 2013-09-08 ENCOUNTER — Other Ambulatory Visit: Payer: Self-pay | Admitting: Oncology

## 2013-09-10 ENCOUNTER — Telehealth: Payer: Self-pay | Admitting: Oncology

## 2013-09-10 ENCOUNTER — Ambulatory Visit: Payer: Medicare Other | Admitting: Family Medicine

## 2013-09-10 NOTE — Telephone Encounter (Signed)
Sent letter to patient from Dr. Magrinat. °

## 2013-09-17 DIAGNOSIS — D472 Monoclonal gammopathy: Secondary | ICD-10-CM | POA: Diagnosis not present

## 2013-10-02 DIAGNOSIS — L821 Other seborrheic keratosis: Secondary | ICD-10-CM | POA: Diagnosis not present

## 2013-10-02 DIAGNOSIS — L723 Sebaceous cyst: Secondary | ICD-10-CM | POA: Diagnosis not present

## 2013-10-02 DIAGNOSIS — D1801 Hemangioma of skin and subcutaneous tissue: Secondary | ICD-10-CM | POA: Diagnosis not present

## 2013-10-02 DIAGNOSIS — L820 Inflamed seborrheic keratosis: Secondary | ICD-10-CM | POA: Diagnosis not present

## 2013-10-02 DIAGNOSIS — L819 Disorder of pigmentation, unspecified: Secondary | ICD-10-CM | POA: Diagnosis not present

## 2013-10-02 DIAGNOSIS — Z85820 Personal history of malignant melanoma of skin: Secondary | ICD-10-CM | POA: Diagnosis not present

## 2013-10-03 ENCOUNTER — Telehealth: Payer: Self-pay | Admitting: Oncology

## 2013-10-03 NOTE — Telephone Encounter (Signed)
s.w. pt incomming call she only wanted to cx lab and financial counsceler...sick and will call back to r/s

## 2013-10-05 DIAGNOSIS — D472 Monoclonal gammopathy: Secondary | ICD-10-CM | POA: Diagnosis not present

## 2013-10-05 DIAGNOSIS — C6930 Malignant neoplasm of unspecified choroid: Secondary | ICD-10-CM | POA: Diagnosis not present

## 2013-10-05 DIAGNOSIS — I6350 Cerebral infarction due to unspecified occlusion or stenosis of unspecified cerebral artery: Secondary | ICD-10-CM | POA: Diagnosis not present

## 2013-10-05 DIAGNOSIS — Z5189 Encounter for other specified aftercare: Secondary | ICD-10-CM | POA: Diagnosis not present

## 2013-10-05 DIAGNOSIS — C73 Malignant neoplasm of thyroid gland: Secondary | ICD-10-CM | POA: Diagnosis not present

## 2013-10-05 DIAGNOSIS — H4310 Vitreous hemorrhage, unspecified eye: Secondary | ICD-10-CM | POA: Diagnosis not present

## 2013-10-05 DIAGNOSIS — C44319 Basal cell carcinoma of skin of other parts of face: Secondary | ICD-10-CM | POA: Diagnosis not present

## 2013-10-05 DIAGNOSIS — F411 Generalized anxiety disorder: Secondary | ICD-10-CM | POA: Diagnosis not present

## 2013-10-08 ENCOUNTER — Ambulatory Visit: Payer: Medicare Other

## 2013-10-08 ENCOUNTER — Other Ambulatory Visit: Payer: Medicare Other

## 2013-10-08 ENCOUNTER — Ambulatory Visit: Payer: Medicare Other | Admitting: Oncology

## 2013-10-08 DIAGNOSIS — H353190 Nonexudative age-related macular degeneration, unspecified eye, stage unspecified: Secondary | ICD-10-CM | POA: Diagnosis not present

## 2013-10-08 DIAGNOSIS — H01029 Squamous blepharitis unspecified eye, unspecified eyelid: Secondary | ICD-10-CM | POA: Diagnosis not present

## 2013-10-08 DIAGNOSIS — C6930 Malignant neoplasm of unspecified choroid: Secondary | ICD-10-CM | POA: Diagnosis not present

## 2013-10-09 ENCOUNTER — Encounter: Payer: Medicare Other | Admitting: Nutrition

## 2013-10-09 ENCOUNTER — Telehealth: Payer: Self-pay | Admitting: Nutrition

## 2013-10-09 NOTE — Telephone Encounter (Signed)
Patient called concerned regarding dietary calcium intake.  Patient reports her calcium levels "are increasing".  She states she was told by her doctor (she could not name which one) to avoid calcium in food.  She has been told by her "thyroid doctor" she is at risk for osteoporosis and needs calcium and vitamin D supplements.  Patient frantic about what kind of milk she can use on her cereal.  She states it is going to snow and she is almost out of milk. Last calcium level noted in EPIC was WNL at 9.9 on November 3.  Patient unable to provide information on recent lab results showing elevated blood calcium.  Educated patient that she does not need to avoid dietary sources of calcium.   Patient educated to contact doctor for supplement recommendations if required for her kidney disease or thyroid disease. Patient verbalized understanding.

## 2013-10-15 ENCOUNTER — Encounter (INDEPENDENT_AMBULATORY_CARE_PROVIDER_SITE_OTHER): Payer: Self-pay

## 2013-10-15 ENCOUNTER — Ambulatory Visit (HOSPITAL_BASED_OUTPATIENT_CLINIC_OR_DEPARTMENT_OTHER): Payer: Medicare Other | Admitting: Oncology

## 2013-10-15 ENCOUNTER — Telehealth: Payer: Self-pay | Admitting: Oncology

## 2013-10-15 ENCOUNTER — Ambulatory Visit (HOSPITAL_BASED_OUTPATIENT_CLINIC_OR_DEPARTMENT_OTHER): Payer: Medicare Other

## 2013-10-15 VITALS — BP 134/80 | HR 71 | Temp 97.8°F | Resp 18 | Ht 61.0 in | Wt 127.3 lb

## 2013-10-15 DIAGNOSIS — Z85820 Personal history of malignant melanoma of skin: Secondary | ICD-10-CM | POA: Diagnosis not present

## 2013-10-15 DIAGNOSIS — R799 Abnormal finding of blood chemistry, unspecified: Secondary | ICD-10-CM | POA: Diagnosis not present

## 2013-10-15 DIAGNOSIS — C9000 Multiple myeloma not having achieved remission: Secondary | ICD-10-CM

## 2013-10-15 DIAGNOSIS — Z85850 Personal history of malignant neoplasm of thyroid: Secondary | ICD-10-CM

## 2013-10-15 DIAGNOSIS — D472 Monoclonal gammopathy: Secondary | ICD-10-CM | POA: Diagnosis not present

## 2013-10-15 DIAGNOSIS — H01029 Squamous blepharitis unspecified eye, unspecified eyelid: Secondary | ICD-10-CM | POA: Diagnosis not present

## 2013-10-15 LAB — COMPREHENSIVE METABOLIC PANEL (CC13)
ALBUMIN: 4 g/dL (ref 3.5–5.0)
ALT: 15 U/L (ref 0–55)
ANION GAP: 11 meq/L (ref 3–11)
AST: 15 U/L (ref 5–34)
Alkaline Phosphatase: 41 U/L (ref 40–150)
BUN: 14.7 mg/dL (ref 7.0–26.0)
CALCIUM: 9.9 mg/dL (ref 8.4–10.4)
CHLORIDE: 106 meq/L (ref 98–109)
CO2: 23 meq/L (ref 22–29)
CREATININE: 0.7 mg/dL (ref 0.6–1.1)
Glucose: 92 mg/dl (ref 70–140)
Potassium: 4.1 mEq/L (ref 3.5–5.1)
SODIUM: 141 meq/L (ref 136–145)
TOTAL PROTEIN: 7.9 g/dL (ref 6.4–8.3)
Total Bilirubin: 1.15 mg/dL (ref 0.20–1.20)

## 2013-10-15 LAB — CBC WITH DIFFERENTIAL/PLATELET
BASO%: 1.1 % (ref 0.0–2.0)
Basophils Absolute: 0.1 10*3/uL (ref 0.0–0.1)
EOS%: 3.1 % (ref 0.0–7.0)
Eosinophils Absolute: 0.2 10*3/uL (ref 0.0–0.5)
HEMATOCRIT: 38.2 % (ref 34.8–46.6)
HGB: 12.9 g/dL (ref 11.6–15.9)
LYMPH#: 1.9 10*3/uL (ref 0.9–3.3)
LYMPH%: 34.4 % (ref 14.0–49.7)
MCH: 30 pg (ref 25.1–34.0)
MCHC: 33.9 g/dL (ref 31.5–36.0)
MCV: 88.6 fL (ref 79.5–101.0)
MONO#: 0.5 10*3/uL (ref 0.1–0.9)
MONO%: 8.9 % (ref 0.0–14.0)
NEUT#: 3 10*3/uL (ref 1.5–6.5)
NEUT%: 52.5 % (ref 38.4–76.8)
Platelets: 253 10*3/uL (ref 145–400)
RBC: 4.31 10*6/uL (ref 3.70–5.45)
RDW: 12.8 % (ref 11.2–14.5)
WBC: 5.6 10*3/uL (ref 3.9–10.3)

## 2013-10-15 NOTE — Progress Notes (Signed)
[name]  [phone]) [phone] [phone]) [phone]  OFFICE PROGRESS NOTE    ID: MEARL HAREWOOD   DOB: 08/05/1941  MR#: [id number]  VHQ#:[id number]   PCP: Gennette Pac, MD OTHER MD: Darlin Coco, Terrilyn Saver, Asencion Noble, Cipriano Bunker, [place] [phone])   HISTORY OF PRESENT ILLNESS: The patient was evaluated by Dr. Jimmy Footman for a history of hematuria, previously evaluated by Irine Seal.  There is a history of multiple urinary tract infections and interstitial cystitis, but no history of stones or family history of hematuria.  Dr. Jimmy Footman suspects this could be interstitial or papillary related to nonsteroidals and aspirin, but he is concerned that this could be related to one of the patient's cancers.  As part of his workup, he obtained serum protein electrophoresis, which showed an M-spike of 800 mg.  A total immunoglobulin G was 1374, total A was 46, and total M was 39.  Immunofixation showed IgG lambda specificity for the monoclonal component.  With this information, the patient was referred for further evaluation and treatment. Her subsequent history is as detailed below.   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 She tells me she just ran out of money and could not go see Dr. Maylon Peppers this past year. [name] has not told her children that she "has bone cancer"--she wanted them to have a good Christmas with her and she did accomplish that successfully. She is very anxious because she feels she is not going to be able to pay for treatments if and when she needs therapy for her smoldering myeloma. She also worries about possible side effects and toxicities of the multiple possible treatments she might  receive, and another where he is whether or those treatments, which are immunosuppressive to some extent, might activate her old melanoma.   REVIEW OF SYSTEMS: [name] denies recentpain, fever, rash, bleeding, unexplained fatigue or unexplained weight loss. She is very anxious and has  "anxiety attacks" at timeswith shortness of breath and palpitations. She in addition has asthma-like symptoms which are being followed by pulmonary. She feels people sometimes don't take her seriously and that this makes matters worse. She has changed her diet to "it less calcium". She tells me she had an infection after a recent eye exam and that took her to the emergency room in [facility] maxillofacial films showed mild soft tissue swelling in the left cheek.she is worried that 1 of Dr. Juanetta [name] assistance did not use clots when touching her right eye. A detailed review of systems today was otherwise stable  PAST MEDICAL HISTORY: Past Medical History  Diagnosis Date  . 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 History of blood clots     LEG  . Stroke   . Hernia   . MGUS (monoclonal gammopathy of unknown significance)   . Chronic kidney disease   . Hypertension   . Hyperlipidemia   . Thyroid disease   . Arthritis   . Mitral valve prolapse   . Anxiety   . Perforated bowel   . Peritonitis   . Diverticulosis     Pt reported on 03/15/12  . MGUS (monoclonal gammopathy of unknown significance)   . Cancer     THYROID, SKIN, NOSE, BONE  . Melanoma   .  Melanoma 1989    chest  1. History of right ocular melanoma, status post enucleation and brachytherapy (I do not have the actual records from the treatments, which were performed in [place], I believe in 2005. 2. History of papillary carcinoma of the thyroid, resected at [facility] approximately 3 years ago and followed by radiation to that area (most likely I-131 but again, I do not have those records).  Multiple other medical problems include hypertension,  history of left body stroke with mild residuals, history of superficial thrombophlebitis, history of mitral valve prolapse, history of hyperlipidemia, history of GERD, history of ventral hernia repair x 2, history of hysterectomy with bilateral salpingo-oophorectomy for fibroids at age 68, history of fibrocystic breast changes, history of reactive airway disease, history of removal of a left axillary mass November 09, 2006, which was benign, history of allergic rhinitis, history of depression, history of osteoporosis, history of remote deep vein thrombosis.   PAST SURGICAL HISTORY: Past Surgical History  Procedure Laterality Date  . Abdominal hysterectomy    . Rotator cuff repair  2004    RIGHT SHOULDER  . Hernia repair    . Leg surgery      VEIN & BLOOD CLOT  . Cardiovascular stress test  2006    NORMAL  . Transthoracic echocardiogram  2006  . Melanoma excision      R eye  . Skin cancer excision  2011, 2013    squamous cell of nose x 2  . Breast lumpectomy  12/2010    R breast  . Breast lumpectomy  2004    L axilla neg for cancer.    FAMILY HISTORY Family History  Problem Relation Age of Onset  . Stroke Mother   . Asthma Father   . Diabetes Father   . Cancer Father   . Heart failure Father   . Cancer Sister   . Diabetes Sister   . Cancer Brother   . Cancer Father     Prostate and kidney cancer  . Stroke Mother 37    Her mother passed away from this stroke at the age of 23  . Cervical cancer Sister   . Cancer Sister     Recurrent cancer involving the neck and the jaw  The patient had a brother who committed suicide.  GYNECOLOGIC HISTORY: She is GX P2.  Did not take hormone replacement after her hysterectomy.    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 She is now retired, divorced, and lives by herself. Her son, [name], lives in [place] and  works as a Hydrologist.  Her son, [name], is a "tree man", again locally.  The patient has 3 grandchildren, one in [place] and two here in town. She attends [employer].       ADVANCED DIRECTIVES: in place   HEALTH MAINTENANCE: History  Substance Use Topics  . Smoking status: Never Smoker   . Smokeless tobacco: Never Used  . Alcohol Use: No     Colonoscopy: 10/22/2010  PAP: 05/03/2012  Bone density: The last bone density scan we have on record dated 04/13/2004 showed a T score of -1.6 (osteopenia).  Lipid panel: 11/07/2012   Allergies  Allergen Reactions  . Doxycycline     SEVERE HEADACHES  . Aldactone [Spironolactone]     Burning in stomach Burning in stomach  . Hydrocodone   .  Levofloxacin   . Pseudoephedrine   . Quinolones   . Sertraline   . Sertraline Hcl   . Sudafed [Pseudoephedrine Hcl]   . Ultram [Tramadol]     Headaches  Headaches   . Adhesive [Tape] Rash  . Latex Rash     Current Outpatient Prescriptions  Medication Sig Dispense Refill  . acetaminophen (TYLENOL) 325 MG tablet Take 325 mg by mouth every 6 (six) hours as needed for pain.      [name] albuterol (VENTOLIN HFA) 108 (90 BASE) MCG/ACT inhaler Inhale 2 puffs into the lungs every 6 (six) hours as needed for wheezing or shortness of breath.       . ALPRAZolam (XANAX) 0.5 MG tablet Take 0.5 mg by mouth 3 (three) times daily as needed for anxiety.       [name] amLODipine (NORVASC) 5 MG tablet Take 1 tablet (5 mg total) by mouth daily.  90 tablet  3  . aspirin 81 MG chewable tablet Chew 81 mg by mouth at bedtime.       . Azelastine-Fluticasone (DYMISTA) 137-50 MCG/ACT SUSP Place 1 puff into the nose 2 (two) times daily.  23 g  11  . calcium citrate-vitamin D (CITRACAL+D) 315-200 MG-UNIT per tablet Take 1 tablet by mouth 2 (two) times daily.       . cholecalciferol (VITAMIN D) 1000 UNITS tablet Take 1,000 Units by mouth daily.      . clonazePAM (KLONOPIN) 0.5 MG tablet Take 0.25-0.5 mg by mouth  at bedtime as needed for anxiety.       . fexofenadine (ALLEGRA) 180 MG tablet Take 1 tablet (180 mg total) by mouth daily.      . fluticasone (CUTIVATE) 0.05 % cream       . hydrochlorothiazide (HYDRODIURIL) 25 MG tablet Take 12.5 mg by mouth daily.       [name] levothyroxine (SYNTHROID, LEVOTHROID) 125 MCG tablet Take 125 mcg by mouth daily.        [name] MICARDIS 40 MG tablet Take 1 tablet (40 mg total) by mouth daily.  90 tablet  3  . mometasone (ASMANEX) 220 MCG/INH inhaler Inhale 2 puffs into the lungs daily.      . simvastatin (ZOCOR) 20 MG tablet Take 1 tablet (20 mg total) by mouth every evening.  90 tablet  3   No current facility-administered medications for this visit.    OBJECTIVE: elderly white woman who appears stated age  73 Vitals:   10/15/13 0932  BP: 134/80  Pulse: 71  Temp: 97.8 F (36.6 C)  Resp: 18     Body mass index is 24.07 kg/(m^2).    ECOG FS: 2  Sclerae unicteric, left pupil round and reactive Oropharynx clear and moist-- no thrush No cervical or supraclavicular adenopathy Lungs no rales or rhonchi Heart regular rate and rhythm, no murmur appreciated Abd soft, nontender, positive bowel sounds MSK no focal spinal tenderness, no lymphedema Neuro: nonfocal, well oriented, anxious and depressed affect Breasts: deferred; there is a small subcutaneous 3 mm palpable lesion in the left axilla consistent with a sebaceous cyst    LAB RESULTS: Results for KADEISHA, BETSCH (MRN [id number]) as of 10/15/2013 09:53  Ref. Range 11/11/2011 10:28 02/29/2012 09:52 07/11/2012 09:31 11/06/2012 10:25 01/02/2013 09:16  IgG (Immunoglobin G), Serum Latest Range: [id number] mg/dL 1340 1570 1770 (H) 1850 (H) 2090 (H)   Results for DAANA, PETRASEK (MRN [id number]) as of 10/15/2013 09:53  Ref. Range 11/06/2012 10:25 03/26/2013 08:19 06/01/2013 07:58  M-SPIKE, %  No range found 1.62 1.72 1.69     Lab Results  Component Value Date   WBC 5.7 06/03/2013   NEUTROABS 3.2 06/03/2013   HGB 12.2 06/03/2013    HCT 36.0 06/03/2013   MCV 88.0 06/03/2013   PLT 222 06/03/2013      Chemistry      Component Value Date/Time   NA 139 08/06/2013 0915   NA 142 06/01/2013 0758   K 3.7 08/06/2013 0915   K 4.0 06/01/2013 0758   CL 103 08/06/2013 0915   CL 103 03/26/2013 0819   CO2 29 08/06/2013 0915   CO2 23 06/01/2013 0758   BUN 14 08/06/2013 0915   BUN 15.4 06/01/2013 0758   CREATININE 0.7 08/06/2013 0915   CREATININE 0.7 06/01/2013 0758      Component Value Date/Time   CALCIUM 9.9 08/06/2013 0915   CALCIUM 10.2 06/01/2013 0758   ALKPHOS 33* 08/06/2013 0915   ALKPHOS 45 06/01/2013 0758   AST 23 08/06/2013 0915   AST 18 06/01/2013 0758   ALT 25 08/06/2013 0915   ALT 19 06/01/2013 0758   BILITOT 1.0 08/06/2013 0915   BILITOT 1.09 06/01/2013 0758      No results found for this basename: LABCA2    No components found with this basename: LABCA125    No results found for this basename: INR,  in the last 168 hours   Urinalysis    Component Value Date/Time   COLORURINE YELLOW 02/13/2013 2029   APPEARANCEUR CLOUDY* 02/13/2013 2029   LABSPEC 1.007 02/13/2013 2029   PHURINE 7.5 02/13/2013 2029   [place] 02/13/2013 2029   HGBUR SMALL* 02/13/2013 2029   [place] NEGATIVE 02/13/2013 2029   [place] NEGATIVE 02/13/2013 2029   PROTEINUR NEGATIVE 02/13/2013 2029   UROBILINOGEN 0.2 02/13/2013 2029   NITRITE NEGATIVE 02/13/2013 2029   LEUKOCYTESUR NEGATIVE 02/13/2013 2029    STUDIES: 1.  Mammogram on 01/16/2003 showed bilateral vascular calcifications.  No dominant mass, suspicious microcalcifications or architectural distortion in either breast.  No significant interval change.  No mammographic evidence of malignancy.  Benign findings.  2. [id number] UN 08/24/13 08:50:[id number] [facility]) : XR MYELOMA SURVEY  EXAM: OSSEOUS SURVEY COMPLETE  Sixteen radiographs of the skeleton including lateral views of the axial skeleton and AP views of the appendicular skeleton are presented for interpretation 08/24/13.   CLINICAL INDICATIONS: 73 year old F. - , Smoldering myeloma. Evaluate for lytic bone lesions., .  COMPARISON: Myeloma survey 08/08/12  Lateral views of the axial skeleton shows no calvarial lesions. Vertebral body disc space narrowing of C5-C7 with anterior marginal osteophytosis. Multiple clips are seen in the soft tissues of the neck. There is anterior wedging of the upper to mid  thoracic spine, unchanged from prior. The lumbar spine shows narrowing at L5-S1 is unchanged prior. No compression fracture of the spine is identified.  Mesh coils over the mid abdomen unchanged. There is no focal lytic or blastic lesion of the extremities. Degenerative changes in bilateral hips and shoulders are noted.  INTERPRETATION LOCATION: Main [facility]  IMPRESSION:  No lytic or blastic lesions or fractures definitively seen in the axial or appendicular skeleton.    ASSESSMENT:72 y.o. 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  (3)  Smoldering myeloma, IgG lambda with  [facility]:   (a) bone marrow biopsy at [facility] 08/24/2013 shows 20-22% plasma cells with hyper diploid cytogenetics, showing extra copies of 4, 11, 13, and 17, no cells with cyclin D1/IGH, FGFR 3/IGH or IGH/C.-MA F gene fusions. No deletion up p53 or 13q was noted. FISH results were normal.  (4)  Melanoma removed from the forehead in 1974, left arm 2005, right chest and shoulder 2006, left mid back to 2010; followed by [facility].  (5)   Moh's surgery to the nose x 2 in 05/2010.  (6)  Benign right breast biopsy on  12/10/2010.   PLAN:  Shima continues to be very anxious and does not appear to have a good understanding of her myeloma situation. She has met with  me previously and with Dr. Melba Coon and I again would done in writing for her that she has a plasma cell clone that may eventually cause her enough problems to qualify for treatment. She does not have "bone cancer." she was supposed to have had lab work a week ago so we could discuss today but she did not feel well that day and did not show. Accordingly we're obtaining those labs today. She will call for results in a week.  If we do decide to start therapy, caused may become an issue and therefore we might consider an intravenous rather than oral regimen. I would discuss that with Dr. Melba Coon to coordinate care optimally. She has an appointment at [facility] late February.  Accordingly I have made a return appointment for her here for late May. We may be able to see her on an every three-month schedule alternating with [facility]. I reassured All[place]na to the best of my ability that we will make sure her economic problems do not limit her treatment. She knows to call for any problems that may develop before her next visit here.  Chauncey Cruel, MD  10/15/2013 9:55 AM    ADDENDUM: I met with Sindhu today to go over her numbers in detail. She understands the change is minimal. While there has been a slight rise in her proteins, she does not have symptomatic myeloma and therefore we are safe not starting treatment now.  Unusually for her, she told me she very much prefers not being treated at present. Partly the reason is financial partly it is some family matters. We are going to continue to follow [facility] closely, with a low threshold for starting therapy when and if her myeloma becomes symptomatic. She knows to call for any problems that may develop before her next visit here.  I personally saw this patient and performed a substantive portion of this encounter with the listed APP documented above.   Chauncey Cruel, MD

## 2013-10-16 ENCOUNTER — Ambulatory Visit: Payer: Medicare Other

## 2013-10-16 NOTE — Addendum Note (Signed)
Addended by: Laureen Abrahams on: 10/16/2013 06:01 PM   Modules accepted: Orders

## 2013-10-17 LAB — PROTEIN ELECTROPHORESIS, SERUM
ALBUMIN ELP: 54.6 % — AB (ref 55.8–66.1)
ALPHA-1-GLOBULIN: 3.7 % (ref 2.9–4.9)
ALPHA-2-GLOBULIN: 7.7 % (ref 7.1–11.8)
BETA 2: 27 % — AB (ref 3.2–6.5)
BETA GLOBULIN: 5 % (ref 4.7–7.2)
Gamma Globulin: 2 % — ABNORMAL LOW (ref 11.1–18.8)
M-Spike, %: 1.98 g/dL
Total Protein, Serum Electrophoresis: 7.8 g/dL (ref 6.0–8.3)

## 2013-10-17 LAB — KAPPA/LAMBDA LIGHT CHAINS
KAPPA LAMBDA RATIO: 0 — AB (ref 0.26–1.65)
Kappa free light chain: 0.14 mg/dL — ABNORMAL LOW (ref 0.33–1.94)
Lambda Free Lght Chn: 51.3 mg/dL — ABNORMAL HIGH (ref 0.57–2.63)

## 2013-10-22 DIAGNOSIS — C73 Malignant neoplasm of thyroid gland: Secondary | ICD-10-CM | POA: Diagnosis not present

## 2013-10-23 DIAGNOSIS — I6350 Cerebral infarction due to unspecified occlusion or stenosis of unspecified cerebral artery: Secondary | ICD-10-CM | POA: Diagnosis not present

## 2013-10-23 DIAGNOSIS — D472 Monoclonal gammopathy: Secondary | ICD-10-CM | POA: Diagnosis not present

## 2013-10-23 DIAGNOSIS — C6930 Malignant neoplasm of unspecified choroid: Secondary | ICD-10-CM | POA: Diagnosis not present

## 2013-10-23 DIAGNOSIS — H4310 Vitreous hemorrhage, unspecified eye: Secondary | ICD-10-CM | POA: Diagnosis not present

## 2013-10-23 DIAGNOSIS — Z5189 Encounter for other specified aftercare: Secondary | ICD-10-CM | POA: Diagnosis not present

## 2013-10-23 DIAGNOSIS — H353190 Nonexudative age-related macular degeneration, unspecified eye, stage unspecified: Secondary | ICD-10-CM | POA: Diagnosis not present

## 2013-10-23 DIAGNOSIS — C44319 Basal cell carcinoma of skin of other parts of face: Secondary | ICD-10-CM | POA: Diagnosis not present

## 2013-10-23 DIAGNOSIS — Z961 Presence of intraocular lens: Secondary | ICD-10-CM | POA: Diagnosis not present

## 2013-10-25 ENCOUNTER — Ambulatory Visit: Payer: Medicare Other | Admitting: Critical Care Medicine

## 2013-10-26 ENCOUNTER — Telehealth: Payer: Self-pay | Admitting: *Deleted

## 2013-10-26 NOTE — Telephone Encounter (Signed)
This RN spoke with pt post MD review of labs.  Discussed with her mild increase in numbers and per MD need to continue to monitor to discuss when chemotherapy should be initiated.  This RN also informed pt of Dr Gerarda Fraction recommendation related to her inquiry regarding calcium level and calcium intake per her diet.  Per MD- concern is not with dietary or food source for calcium but supplements of calcium as well as pt should avoid vitamin d as a supplement. This was discussed with pt.  This RN asked if [name] had any other questions which she stated she did not. Condolences given regarding the death of her baby sister.  No other needs at this time.

## 2013-10-26 NOTE — Telephone Encounter (Signed)
Message left by pt stating she was seen on Monday by MD and obtained lab work same day.  " my baby sister died on 2023-01-16 from cancer and I went to [place] for the funeral yesterday "  " I need you to call me with my blood results and tell how much my numbers increased "  " please call me and review these with me today ".  Return call number given as [phone].  This note with lab results will be given to MD for review and this RN will call the patient.

## 2013-11-01 ENCOUNTER — Encounter: Payer: Self-pay | Admitting: Critical Care Medicine

## 2013-11-01 ENCOUNTER — Ambulatory Visit (INDEPENDENT_AMBULATORY_CARE_PROVIDER_SITE_OTHER): Payer: Medicare Other | Admitting: Critical Care Medicine

## 2013-11-01 VITALS — BP 138/72 | HR 74 | Temp 97.9°F | Ht 61.0 in | Wt 126.6 lb

## 2013-11-01 DIAGNOSIS — J45909 Unspecified asthma, uncomplicated: Secondary | ICD-10-CM

## 2013-11-01 DIAGNOSIS — C9000 Multiple myeloma not having achieved remission: Secondary | ICD-10-CM | POA: Insufficient documentation

## 2013-11-01 NOTE — Patient Instructions (Addendum)
No change in medications. Return in         4 months 

## 2013-11-01 NOTE — Progress Notes (Signed)
Subjective:    Patient ID: [name], female    DOB: Dec 21, 1940, 73 y.o.   MRN: [id number]  HPI   73 y.o. WF  never smoker with  H/o asthma/ allergies to dust mold  and referred by Dr Rex Kras 10/21/2011 for pulmonary evaluation for difficult to control asthma   11/01/2013 Chief Complaint  Patient presents with  . Follow-up    Pt c/o runny nose. Denies cough, congestion.  Still with pndrip. Does help with sinus rinse. Uses distilled water.  No cough now. Occ hoarseness.  Sister just passed.   Dx Myeloma. [place]. Now planned to see [facility] cancer MD  Past Medical History  Diagnosis Date  . 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 History of blood clots     LEG  . Stroke   . Hernia   . MGUS (monoclonal gammopathy of unknown significance)   . Chronic kidney disease   . Hypertension   . Hyperlipidemia   . Thyroid disease   . Arthritis   . Mitral valve prolapse   . Anxiety   . Perforated bowel   . Peritonitis   . Diverticulosis     Pt reported on 03/15/12  . MGUS (monoclonal gammopathy of unknown significance)   . Cancer     THYROID, SKIN, NOSE, BONE  . Melanoma   . Melanoma 1989    chest     Family History  Problem Relation Age of Onset  . Stroke Mother   . Asthma Father   . Diabetes Father   . Cancer Father   . Heart failure Father   . Cancer Sister   . Diabetes Sister   . Cancer Brother   . Cancer Father     Prostate and kidney cancer  . Stroke Mother 37    Her mother passed away from this stroke at the age of 78  . Cervical cancer Sister   . Cancer Sister     Recurrent cancer involving the neck and the jaw     History   Social History  . Marital Status: Divorced    Spouse Name: N/A    Number of Children: 2  . Years of Education: N/A   Occupational History  . Retired     Architect   Social History Main Topics  . Smoking status: Never Smoker   . Smokeless tobacco: Never Used  . Alcohol Use: No  . Drug Use: No  . Sexual Activity: No    Other Topics Concern  . Not on file   Social History Narrative  . No narrative on file     Allergies  Allergen Reactions  . Doxycycline     SEVERE HEADACHES  . Aldactone [Spironolactone]     Burning in stomach Burning in stomach  . Hydrocodone   . Levofloxacin   . Pseudoephedrine   . Quinolones   . Sertraline   . Sertraline Hcl   . Sudafed [Pseudoephedrine Hcl]   . Ultram [Tramadol]     Headaches  Headaches   . Adhesive [Tape] Rash  . Latex Rash     Outpatient Prescriptions Prior to Visit  Medication Sig Dispense Refill  . acetaminophen (TYLENOL) 325 MG tablet Take 325 mg by mouth every 6 (six) hours as needed for pain.      [name] albuterol (VENTOLIN HFA) 108 (90 BASE) MCG/ACT inhaler Inhale 2 puffs into the lungs every 6 (six) hours as needed for wheezing or shortness of breath.       [name]  ALPRAZolam (XANAX) 0.5 MG tablet Take 0.5 mg by mouth 3 (three) times daily as needed for anxiety.       [name] amLODipine (NORVASC) 5 MG tablet Take 1 tablet (5 mg total) by mouth daily.  90 tablet  3  . aspirin 81 MG chewable tablet Chew 81 mg by mouth at bedtime.       . clonazePAM (KLONOPIN) 0.5 MG tablet Take 0.25-0.5 mg by mouth at bedtime as needed for anxiety.       . fluticasone (CUTIVATE) 0.05 % cream       . levothyroxine (SYNTHROID, LEVOTHROID) 125 MCG tablet Take 125 mcg by mouth daily.        [name] MICARDIS 40 MG tablet Take 1 tablet (40 mg total) by mouth daily.  90 tablet  3  . simvastatin (ZOCOR) 20 MG tablet Take 1 tablet (20 mg total) by mouth every evening.  90 tablet  3  . fexofenadine (ALLEGRA) 180 MG tablet Take 1 tablet (180 mg total) by mouth daily.      . mometasone (ASMANEX) 220 MCG/INH inhaler Inhale 2 puffs into the lungs daily.      . Azelastine-Fluticasone (DYMISTA) 137-50 MCG/ACT SUSP Place 1 puff into the nose 2 (two) times daily.  23 g  11  . cholecalciferol (VITAMIN D) 1000 UNITS tablet Take 1,000 Units by mouth daily.      . hydrochlorothiazide (HYDRODIURIL) 25  MG tablet Take 12.5 mg by mouth daily.        No facility-administered medications prior to visit.       Review of Systems  Constitutional:   No  weight loss, night sweats,  Fevers, chills, fatigue, lassitude. HEENT:   No headaches,  Difficulty swallowing,  Tooth/dental problems,  +++Sore throat,                No sneezing, itching, ear ache,+++ nasal congestion, +++post nasal drip,   CV:  No chest pain,  Orthopnea, PND, swelling in lower extremities, anasarca, dizziness, palpitations  GI  No heartburn, indigestion, abdominal pain, nausea, vomiting, diarrhea, change in bowel habits, loss of appetite  Resp: Notes shortness of breath with exertion not at rest.  No wheezing.  No chest wall deformity  Skin: no rash or lesions.  GU: no dysuria, change in color of urine, no urgency or frequency.  No flank pain.  MS:  No joint pain or swelling.  No decreased range of motion.  No back pain.  Psych:  No change in mood or affect. No depression or anxiety.  No memory loss.     Objective:   Physical Exam  Filed Vitals:   11/01/13 1008  BP: 138/72  Pulse: 74  Temp: 97.9 F (36.6 C)  TempSrc: Oral  Height: 5\' 1"  (1.549 m)  Weight: 126 lb 9.6 oz (57.425 kg)  SpO2: 99%    Gen: Pleasant, well-nourished, in no distress,  normal affect  ENT: No lesions,  mouth clear,  oropharynx clear,no purulence in nares  Neck: No JVD, no TMG, no carotid bruits  Lungs: No use of accessory muscles, no dullness to percussion, distant breath sounds  Cardiovascular: RRR, heart sounds normal, no murmur or gallops, no peripheral edema  Abdomen: soft and NT, no HSM,  BS normal  Musculoskeletal: No deformities, no cyanosis or clubbing  Neuro: alert, non focal  Skin: Warm, no lesions or rashes     Assessment & Plan:   Moderate persistent asthma with primary cough variation Moderate persistent asthma with primary  cough variation in stable at this time Plan Maintain Asmanex    Updated  Medication List Outpatient Encounter Prescriptions as of 11/01/2013  Medication Sig  . acetaminophen (TYLENOL) 325 MG tablet Take 325 mg by mouth every 6 (six) hours as needed for pain.  [name] albuterol (VENTOLIN HFA) 108 (90 BASE) MCG/ACT inhaler Inhale 2 puffs into the lungs every 6 (six) hours as needed for wheezing or shortness of breath.   . ALPRAZolam (XANAX) 0.5 MG tablet Take 0.5 mg by mouth 3 (three) times daily as needed for anxiety.   [name] amLODipine (NORVASC) 5 MG tablet Take 1 tablet (5 mg total) by mouth daily.  [name] aspirin 81 MG chewable tablet Chew 81 mg by mouth at bedtime.   . clonazePAM (KLONOPIN) 0.5 MG tablet Take 0.25-0.5 mg by mouth at bedtime as needed for anxiety.   . fexofenadine (ALLEGRA) 180 MG tablet Take 90 mg by mouth daily.  . fluticasone (CUTIVATE) 0.05 % cream   . levothyroxine (SYNTHROID, LEVOTHROID) 125 MCG tablet Take 125 mcg by mouth daily.    [name] MICARDIS 40 MG tablet Take 1 tablet (40 mg total) by mouth daily.  . prednisoLONE acetate (PRED FORTE) 1 % ophthalmic suspension 1 drop daily. Drop in right eye  . simvastatin (ZOCOR) 20 MG tablet Take 1 tablet (20 mg total) by mouth every evening.  . [DISCONTINUED] fexofenadine (ALLEGRA) 180 MG tablet Take 1 tablet (180 mg total) by mouth daily.  . mometasone (ASMANEX) 220 MCG/INH inhaler Inhale 2 puffs into the lungs daily.  . [DISCONTINUED] Azelastine-Fluticasone (DYMISTA) 137-50 MCG/ACT SUSP Place 1 puff into the nose 2 (two) times daily.  . [DISCONTINUED] cholecalciferol (VITAMIN D) 1000 UNITS tablet Take 1,000 Units by mouth daily.  . [DISCONTINUED] hydrochlorothiazide (HYDRODIURIL) 25 MG tablet Take 12.5 mg by mouth daily.

## 2013-11-02 NOTE — Assessment & Plan Note (Addendum)
Moderate persistent asthma with primary cough variation in stable at this time Plan Maintain Asmanex

## 2013-11-05 DIAGNOSIS — R319 Hematuria, unspecified: Secondary | ICD-10-CM | POA: Diagnosis not present

## 2013-11-05 DIAGNOSIS — D472 Monoclonal gammopathy: Secondary | ICD-10-CM | POA: Diagnosis not present

## 2013-11-05 DIAGNOSIS — I129 Hypertensive chronic kidney disease with stage 1 through stage 4 chronic kidney disease, or unspecified chronic kidney disease: Secondary | ICD-10-CM | POA: Diagnosis not present

## 2013-11-05 DIAGNOSIS — N182 Chronic kidney disease, stage 2 (mild): Secondary | ICD-10-CM | POA: Diagnosis not present

## 2013-11-22 DIAGNOSIS — H2510 Age-related nuclear cataract, unspecified eye: Secondary | ICD-10-CM | POA: Diagnosis not present

## 2013-11-22 DIAGNOSIS — H01029 Squamous blepharitis unspecified eye, unspecified eyelid: Secondary | ICD-10-CM | POA: Diagnosis not present

## 2013-11-22 DIAGNOSIS — H35379 Puckering of macula, unspecified eye: Secondary | ICD-10-CM | POA: Diagnosis not present

## 2013-11-27 DIAGNOSIS — C9000 Multiple myeloma not having achieved remission: Secondary | ICD-10-CM | POA: Diagnosis not present

## 2013-11-27 DIAGNOSIS — C801 Malignant (primary) neoplasm, unspecified: Secondary | ICD-10-CM | POA: Diagnosis not present

## 2013-11-30 DIAGNOSIS — C9000 Multiple myeloma not having achieved remission: Secondary | ICD-10-CM | POA: Diagnosis not present

## 2013-12-04 ENCOUNTER — Telehealth: Payer: Self-pay | Admitting: *Deleted

## 2013-12-04 DIAGNOSIS — F411 Generalized anxiety disorder: Secondary | ICD-10-CM | POA: Diagnosis not present

## 2013-12-04 DIAGNOSIS — C9000 Multiple myeloma not having achieved remission: Secondary | ICD-10-CM | POA: Diagnosis not present

## 2013-12-04 DIAGNOSIS — R450 Nervousness: Secondary | ICD-10-CM | POA: Diagnosis not present

## 2013-12-04 NOTE — Telephone Encounter (Signed)
Pt called to cancel all her appts w/ [facility] for May 2015. She stated that she would called back to schedule an appt if needed...td

## 2013-12-07 ENCOUNTER — Telehealth: Payer: Self-pay | Admitting: Cardiology

## 2013-12-07 ENCOUNTER — Ambulatory Visit: Payer: Medicare Other | Admitting: Cardiology

## 2013-12-07 NOTE — Telephone Encounter (Signed)
New Message  Pt called states that she will start [place] on 12/10/2013. Pt is requesting to come in today verses Monday. Please call back to discuss further.

## 2013-12-07 NOTE — Telephone Encounter (Signed)
Spoke with patient and was willing to get her in today but she needed fasting labs. Patient will just keep her appointment Monday

## 2013-12-10 ENCOUNTER — Other Ambulatory Visit: Payer: Self-pay | Admitting: *Deleted

## 2013-12-10 ENCOUNTER — Ambulatory Visit: Payer: Medicare Other | Admitting: Cardiology

## 2013-12-10 ENCOUNTER — Telehealth: Payer: Self-pay | Admitting: Cardiology

## 2013-12-10 ENCOUNTER — Ambulatory Visit (INDEPENDENT_AMBULATORY_CARE_PROVIDER_SITE_OTHER): Payer: Medicare Other | Admitting: Cardiology

## 2013-12-10 ENCOUNTER — Encounter: Payer: Self-pay | Admitting: Cardiology

## 2013-12-10 ENCOUNTER — Other Ambulatory Visit (INDEPENDENT_AMBULATORY_CARE_PROVIDER_SITE_OTHER): Payer: Medicare Other

## 2013-12-10 VITALS — BP 134/70 | HR 69 | Ht 61.0 in | Wt 125.0 lb

## 2013-12-10 DIAGNOSIS — E7800 Pure hypercholesterolemia, unspecified: Secondary | ICD-10-CM

## 2013-12-10 DIAGNOSIS — F411 Generalized anxiety disorder: Secondary | ICD-10-CM | POA: Diagnosis not present

## 2013-12-10 DIAGNOSIS — E039 Hypothyroidism, unspecified: Secondary | ICD-10-CM

## 2013-12-10 DIAGNOSIS — I119 Hypertensive heart disease without heart failure: Secondary | ICD-10-CM | POA: Diagnosis not present

## 2013-12-10 DIAGNOSIS — C439 Malignant melanoma of skin, unspecified: Secondary | ICD-10-CM

## 2013-12-10 DIAGNOSIS — C9000 Multiple myeloma not having achieved remission: Secondary | ICD-10-CM

## 2013-12-10 DIAGNOSIS — Z634 Disappearance and death of family member: Secondary | ICD-10-CM | POA: Diagnosis not present

## 2013-12-10 LAB — BASIC METABOLIC PANEL
BUN: 15 mg/dL (ref 6–23)
CALCIUM: 9.9 mg/dL (ref 8.4–10.5)
CO2: 26 mEq/L (ref 19–32)
Chloride: 106 mEq/L (ref 96–112)
Creatinine, Ser: 0.7 mg/dL (ref 0.4–1.2)
GFR: 88.26 mL/min (ref >60.00)
GLUCOSE: 91 mg/dL (ref 70–99)
Potassium: 3.7 mEq/L (ref 3.5–5.1)
Sodium: 142 mEq/L (ref 135–145)

## 2013-12-10 LAB — LIPID PANEL
Cholesterol: 150 mg/dL (ref 0–200)
HDL: 54.5 mg/dL (ref >39.00)
LDL Cholesterol: 83 mg/dL (ref 0–99)
Total CHOL/HDL Ratio: 3
Triglycerides: 63 mg/dL (ref 0.0–149.0)
VLDL: 12.6 mg/dL (ref 0.0–40.0)

## 2013-12-10 LAB — HEPATIC FUNCTION PANEL
ALK PHOS: 39 U/L (ref 39–117)
ALT: 16 U/L (ref 0–35)
AST: 19 U/L (ref 0–37)
Albumin: 4.1 g/dL (ref 3.5–5.2)
BILIRUBIN DIRECT: 0.1 mg/dL (ref 0.0–0.3)
BILIRUBIN TOTAL: 1 mg/dL (ref 0.3–1.2)
Total Protein: 7.9 g/dL (ref 6.0–8.3)

## 2013-12-10 MED ORDER — SIMVASTATIN 20 MG PO TABS: 20.0000 mg | ORAL_TABLET | Freq: Every evening | ORAL | Status: DC

## 2013-12-10 NOTE — Assessment & Plan Note (Signed)
Patient has a history of hypercholesterolemia.  We refilled her simvastatin 20 mg daily.  She is not having any myalgias or side effects from the statin therapy.  Blood work today is pending.

## 2013-12-10 NOTE — Patient Instructions (Signed)
Your physician recommends that you continue on your current medications as directed. Please refer to the Current Medication list given to you today.  Your physician wants you to follow-up in: 4 months with fasting labs (lp/bmet/hfp) You will receive a reminder letter in the mail two months in advance. If you don't receive a letter, please call our office to schedule the follow-up appointment.  

## 2013-12-10 NOTE — Progress Notes (Signed)
Quick Note:  Please report to patient. The recent labs are stable. Continue same medication and careful diet.Lipids are better. ______ 

## 2013-12-10 NOTE — Assessment & Plan Note (Signed)
Her blood pressure has been remaining stable on current therapy.  She's not having any chest pain or angina.  Her electrocardiogram today is normal.  She has not been having any palpitations dizziness or syncope

## 2013-12-10 NOTE — Telephone Encounter (Signed)
New        Pt called back and asked that this office fax today's office notes  to Attn: Dr. Dustin Folks # [phone] [facility] Hematology Oncology. [name]

## 2013-12-10 NOTE — Telephone Encounter (Signed)
Sent as requested and advised patient

## 2013-12-10 NOTE — Progress Notes (Signed)
[name] Date of Birth:  [birth year] [address] [place], [place]  [address] [phone]         Fax   [phone]  History of Present Illness: This pleasant 73 year old woman is seen for followup office visit. She has a past history of essential hypertension. She also has a past history of hypercholesterolemia and a past history of melanoma as well as thyroid cancer. She has had previous atypical chest pain. She had a normal nuclear stress test in 2006. Since last visit she has had several new problems.  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  She is being seen by oncology at [facility] and this week she will be starting chemotherapy with Revlimid 5 mg daily for 3 weeks and then off one week.  The concern is that the chemotherapy for the multiple myeloma may allow the malignant melanoma to spread.   Current Outpatient Prescriptions  Medication Sig Dispense Refill  . acetaminophen (TYLENOL) 325 MG tablet Take 325 mg by mouth every 6 (six) hours as needed for pain.      [name] albuterol (VENTOLIN HFA) 108 (90 BASE) MCG/ACT inhaler Inhale 2 puffs into the lungs every 6 (six) hours as needed for wheezing or shortness of breath.       . ALPRAZolam (XANAX) 0.5 MG tablet Take 0.5 mg by mouth 3 (three) times daily as needed for anxiety.       [name] amLODipine (NORVASC) 5 MG tablet Take 1 tablet (5 mg total) by mouth daily.  90 tablet  3  . aspirin 81 MG chewable tablet Chew 81 mg by mouth at bedtime.       . clonazePAM (KLONOPIN) 0.5 MG tablet Take 0.25-0.5 mg by mouth at bedtime as needed for anxiety.       . fexofenadine (ALLEGRA) 180 MG tablet Take 90 mg by mouth daily.      . fluticasone (CUTIVATE) 0.05 % cream       . levothyroxine (SYNTHROID, LEVOTHROID) 125 MCG tablet Take 125 mcg by mouth daily.        [name] MICARDIS 40 MG tablet Take 1 tablet (40 mg total) by mouth daily.  90 tablet  3  . mometasone (ASMANEX)  220 MCG/INH inhaler Inhale 2 puffs into the lungs daily.      . simvastatin (ZOCOR) 20 MG tablet Take 1 tablet (20 mg total) by mouth every evening.  90 tablet  3   No current facility-administered medications for this visit.    Allergies  Allergen Reactions  . Doxycycline     SEVERE HEADACHES  . Aldactone [Spironolactone]     Burning in stomach Burning in stomach  . Hydrocodone   . Levofloxacin   . Prednisone   . Pseudoephedrine   . Quinolones   . Sertraline   . Sertraline Hcl   . Sudafed [Pseudoephedrine Hcl]   . Ultram [Tramadol]     Headaches  Headaches   . Adhesive [Tape] Rash  . Latex Rash    Patient Active Problem List   Diagnosis Date Noted  . Multiple myeloma without remission 11/01/2013  . MGUS (monoclonal gammopathy of unknown significance) 11/26/2011  . Moderate persistent asthma with primary cough variation 09/21/2011  . Benign hypertensive heart disease without heart failure 03/31/2011  . Hypercholesterolemia 03/31/2011  . Hypothyroidism 03/31/2011  . History of melanoma 03/31/2011  . Hx of thyroid cancer 03/31/2011  History  Smoking status  . Never Smoker   Smokeless tobacco  . Never Used    History  Alcohol Use No    Family History  Problem Relation Age of Onset  . Stroke Mother   . Asthma Father   . Diabetes Father   . Cancer Father   . Heart failure Father   . Cancer Sister   . Diabetes Sister   . Cancer Brother   . Cancer Father     Prostate and kidney cancer  . Stroke Mother 31    Her mother passed away from this stroke at the age of 30  . Cervical cancer Sister   . Cancer Sister     Recurrent cancer involving the neck and the jaw    Review of Systems: Constitutional: no fever chills diaphoresis or fatigue or change in weight.  Head and neck: no hearing loss, no epistaxis, no photophobia or visual disturbance. Respiratory: No cough, shortness of breath or wheezing. Cardiovascular: No chest pain peripheral edema,  palpitations. Gastrointestinal: No abdominal distention, no abdominal pain, no change in bowel habits hematochezia or melena. Genitourinary: No dysuria, no frequency, no urgency, no nocturia. Musculoskeletal:No arthralgias, no back pain, no gait disturbance or myalgias. Neurological: No dizziness, no headaches, no numbness, no seizures, no syncope, no weakness, no tremors. Hematologic: No lymphadenopathy, no easy bruising. Psychiatric: No confusion, no hallucinations, no sleep disturbance.    Physical Exam: Filed Vitals:   12/10/13 0855  BP: 134/70  Pulse: 69   the general appearance reveals a well-developed well-nourished elderly woman in no distress.The head and neck exam reveals pupils equal and reactive.  Extraocular movements are full.  There is no scleral icterus.  The mouth and pharynx are normal.  The neck is supple.  The carotids reveal no bruits.  The jugular venous pressure is normal.  The  thyroid is not enlarged.  There is no lymphadenopathy.  The chest is clear to percussion and auscultation.  There are no rales or rhonchi.  Expansion of the chest is symmetrical.  The precordium is quiet.  The first heart sound is normal.  The second heart sound is physiologically split.  There is no murmur gallop rub or click.  There is no abnormal lift or heave.  The abdomen is soft and nontender.  The bowel sounds are normal.  The liver and spleen are not enlarged.  There are no abdominal masses.  There are no abdominal bruits.  Extremities reveal good pedal pulses.  There is no phlebitis or edema.  There is no cyanosis or clubbing.  Strength is normal and symmetrical in all extremities.  There is no lateralizing weakness.  There are no sensory deficits.  The skin is warm and dry.  There is no rash.  EKG today shows normal sinus rhythm and is within normal limits.  Assessment / Plan: Continue same medication.  Recheck in 4 months for office visit lipid panel hepatic function panel and basal  metabolic panel. The patient herself has had 3 types of cancer, thyroid cancer, multiple myeloma , and malignant melanoma.  There is a strong family history of cancer.  Since we last saw her her sister died of lung cancer after short illness and her brother has turned out to have brain cancer and is undergoing chemotherapy. Recheck here in 4 months for followup office visit lipid panel hepatic function panel and basal metabolic panel.

## 2013-12-10 NOTE — Assessment & Plan Note (Signed)
The patient will be starting her chemotherapy for multiple myeloma this week

## 2013-12-12 ENCOUNTER — Telehealth: Payer: Self-pay | Admitting: *Deleted

## 2013-12-12 NOTE — Telephone Encounter (Signed)
Message copied by Earvin Hansen on Wed Dec 12, 2013 10:06 AM ------      Message from: Darlin Coco      Created: Mon Dec 10, 2013  8:03 PM       Please report to patient.  The recent labs are stable. Continue same medication and careful diet. Lipids are better. ------

## 2013-12-12 NOTE — Telephone Encounter (Signed)
Advised patient of lab results  

## 2013-12-13 NOTE — Telephone Encounter (Signed)
No additional info °

## 2013-12-18 DIAGNOSIS — C801 Malignant (primary) neoplasm, unspecified: Secondary | ICD-10-CM | POA: Diagnosis not present

## 2013-12-18 DIAGNOSIS — C9000 Multiple myeloma not having achieved remission: Secondary | ICD-10-CM | POA: Diagnosis not present

## 2013-12-20 DIAGNOSIS — N63 Unspecified lump in unspecified breast: Secondary | ICD-10-CM | POA: Diagnosis not present

## 2013-12-20 DIAGNOSIS — C9000 Multiple myeloma not having achieved remission: Secondary | ICD-10-CM | POA: Diagnosis not present

## 2013-12-20 DIAGNOSIS — I6350 Cerebral infarction due to unspecified occlusion or stenosis of unspecified cerebral artery: Secondary | ICD-10-CM | POA: Diagnosis not present

## 2013-12-20 DIAGNOSIS — E018 Other iodine-deficiency related thyroid disorders and allied conditions: Secondary | ICD-10-CM | POA: Diagnosis not present

## 2013-12-20 DIAGNOSIS — Z1331 Encounter for screening for depression: Secondary | ICD-10-CM | POA: Diagnosis not present

## 2013-12-20 DIAGNOSIS — E785 Hyperlipidemia, unspecified: Secondary | ICD-10-CM | POA: Diagnosis not present

## 2013-12-20 DIAGNOSIS — Z859 Personal history of malignant neoplasm, unspecified: Secondary | ICD-10-CM | POA: Diagnosis not present

## 2013-12-20 DIAGNOSIS — N189 Chronic kidney disease, unspecified: Secondary | ICD-10-CM | POA: Diagnosis not present

## 2013-12-20 DIAGNOSIS — Z85820 Personal history of malignant melanoma of skin: Secondary | ICD-10-CM | POA: Diagnosis not present

## 2013-12-25 DIAGNOSIS — I129 Hypertensive chronic kidney disease with stage 1 through stage 4 chronic kidney disease, or unspecified chronic kidney disease: Secondary | ICD-10-CM | POA: Diagnosis not present

## 2013-12-25 DIAGNOSIS — N182 Chronic kidney disease, stage 2 (mild): Secondary | ICD-10-CM | POA: Diagnosis not present

## 2013-12-26 DIAGNOSIS — F411 Generalized anxiety disorder: Secondary | ICD-10-CM | POA: Diagnosis not present

## 2013-12-26 DIAGNOSIS — C9000 Multiple myeloma not having achieved remission: Secondary | ICD-10-CM | POA: Diagnosis not present

## 2013-12-28 DIAGNOSIS — C801 Malignant (primary) neoplasm, unspecified: Secondary | ICD-10-CM | POA: Diagnosis not present

## 2013-12-28 DIAGNOSIS — C9000 Multiple myeloma not having achieved remission: Secondary | ICD-10-CM | POA: Diagnosis not present

## 2013-12-28 DIAGNOSIS — Z634 Disappearance and death of family member: Secondary | ICD-10-CM | POA: Diagnosis not present

## 2013-12-28 DIAGNOSIS — R51 Headache: Secondary | ICD-10-CM | POA: Diagnosis not present

## 2013-12-28 DIAGNOSIS — K1370 Unspecified lesions of oral mucosa: Secondary | ICD-10-CM | POA: Diagnosis not present

## 2013-12-28 DIAGNOSIS — F411 Generalized anxiety disorder: Secondary | ICD-10-CM | POA: Diagnosis not present

## 2014-01-02 ENCOUNTER — Other Ambulatory Visit: Payer: Self-pay | Admitting: Radiology

## 2014-01-02 DIAGNOSIS — R920 Mammographic microcalcification found on diagnostic imaging of breast: Secondary | ICD-10-CM | POA: Diagnosis not present

## 2014-01-02 DIAGNOSIS — R928 Other abnormal and inconclusive findings on diagnostic imaging of breast: Secondary | ICD-10-CM | POA: Diagnosis not present

## 2014-01-02 DIAGNOSIS — N63 Unspecified lump in unspecified breast: Secondary | ICD-10-CM | POA: Diagnosis not present

## 2014-01-02 DIAGNOSIS — N6019 Diffuse cystic mastopathy of unspecified breast: Secondary | ICD-10-CM | POA: Diagnosis not present

## 2014-01-02 DIAGNOSIS — Z0189 Encounter for other specified special examinations: Secondary | ICD-10-CM | POA: Diagnosis not present

## 2014-01-03 DIAGNOSIS — F411 Generalized anxiety disorder: Secondary | ICD-10-CM | POA: Diagnosis not present

## 2014-01-03 DIAGNOSIS — C9000 Multiple myeloma not having achieved remission: Secondary | ICD-10-CM | POA: Diagnosis not present

## 2014-01-03 DIAGNOSIS — R51 Headache: Secondary | ICD-10-CM | POA: Diagnosis not present

## 2014-01-03 DIAGNOSIS — Z634 Disappearance and death of family member: Secondary | ICD-10-CM | POA: Diagnosis not present

## 2014-01-09 DIAGNOSIS — R51 Headache: Secondary | ICD-10-CM | POA: Diagnosis not present

## 2014-01-09 DIAGNOSIS — F411 Generalized anxiety disorder: Secondary | ICD-10-CM | POA: Diagnosis not present

## 2014-01-09 DIAGNOSIS — Z634 Disappearance and death of family member: Secondary | ICD-10-CM | POA: Diagnosis not present

## 2014-01-14 DIAGNOSIS — Z85850 Personal history of malignant neoplasm of thyroid: Secondary | ICD-10-CM | POA: Diagnosis not present

## 2014-01-14 DIAGNOSIS — N189 Chronic kidney disease, unspecified: Secondary | ICD-10-CM | POA: Diagnosis not present

## 2014-01-14 DIAGNOSIS — M795 Residual foreign body in soft tissue: Secondary | ICD-10-CM | POA: Diagnosis not present

## 2014-01-14 DIAGNOSIS — L821 Other seborrheic keratosis: Secondary | ICD-10-CM | POA: Diagnosis not present

## 2014-01-14 DIAGNOSIS — R82998 Other abnormal findings in urine: Secondary | ICD-10-CM | POA: Diagnosis not present

## 2014-01-14 DIAGNOSIS — E785 Hyperlipidemia, unspecified: Secondary | ICD-10-CM | POA: Diagnosis not present

## 2014-01-14 DIAGNOSIS — E018 Other iodine-deficiency related thyroid disorders and allied conditions: Secondary | ICD-10-CM | POA: Diagnosis not present

## 2014-01-15 DIAGNOSIS — C9000 Multiple myeloma not having achieved remission: Secondary | ICD-10-CM | POA: Diagnosis not present

## 2014-01-22 ENCOUNTER — Telehealth: Payer: Self-pay | Admitting: Cardiology

## 2014-01-22 DIAGNOSIS — R450 Nervousness: Secondary | ICD-10-CM | POA: Diagnosis not present

## 2014-01-22 DIAGNOSIS — C9000 Multiple myeloma not having achieved remission: Secondary | ICD-10-CM | POA: Diagnosis not present

## 2014-01-22 DIAGNOSIS — F411 Generalized anxiety disorder: Secondary | ICD-10-CM | POA: Diagnosis not present

## 2014-01-22 NOTE — Telephone Encounter (Signed)
Left message to call back  

## 2014-01-22 NOTE — Telephone Encounter (Signed)
Dr. Mare Ferrari reviewed faxed information and ok to try. Advised patient

## 2014-01-22 NOTE — Telephone Encounter (Signed)
New message  Pt called states that our office will need to fax over information in regards to her heart condition. She states that her oncologist Dr. Johnathan Hausen with [facility] has ordered a different type of medication that may increase the risk of a heart attack. She states that there are three pages of information that was faxed to our office with detailed information about the medication and she wants to be sure that nurse Rip Harbour has received it.  Pt requests a call back before 9:30 am. Please assist.

## 2014-01-22 NOTE — Telephone Encounter (Signed)
Spoke with patient she will have oncology office resend information for  Dr. Mare Ferrari to review

## 2014-01-24 DIAGNOSIS — F411 Generalized anxiety disorder: Secondary | ICD-10-CM | POA: Diagnosis not present

## 2014-01-28 DIAGNOSIS — Z5111 Encounter for antineoplastic chemotherapy: Secondary | ICD-10-CM | POA: Diagnosis not present

## 2014-01-28 DIAGNOSIS — C9000 Multiple myeloma not having achieved remission: Secondary | ICD-10-CM | POA: Diagnosis not present

## 2014-01-28 DIAGNOSIS — E860 Dehydration: Secondary | ICD-10-CM | POA: Diagnosis not present

## 2014-01-28 DIAGNOSIS — R112 Nausea with vomiting, unspecified: Secondary | ICD-10-CM | POA: Diagnosis not present

## 2014-01-29 DIAGNOSIS — H1045 Other chronic allergic conjunctivitis: Secondary | ICD-10-CM | POA: Diagnosis not present

## 2014-02-04 DIAGNOSIS — Z5111 Encounter for antineoplastic chemotherapy: Secondary | ICD-10-CM | POA: Diagnosis not present

## 2014-02-04 DIAGNOSIS — F411 Generalized anxiety disorder: Secondary | ICD-10-CM | POA: Diagnosis not present

## 2014-02-04 DIAGNOSIS — R112 Nausea with vomiting, unspecified: Secondary | ICD-10-CM | POA: Diagnosis not present

## 2014-02-04 DIAGNOSIS — C9000 Multiple myeloma not having achieved remission: Secondary | ICD-10-CM | POA: Diagnosis not present

## 2014-02-04 DIAGNOSIS — E860 Dehydration: Secondary | ICD-10-CM | POA: Diagnosis not present

## 2014-02-04 DIAGNOSIS — Z634 Disappearance and death of family member: Secondary | ICD-10-CM | POA: Diagnosis not present

## 2014-02-05 ENCOUNTER — Ambulatory Visit: Payer: Medicare Other

## 2014-02-06 ENCOUNTER — Ambulatory Visit (INDEPENDENT_AMBULATORY_CARE_PROVIDER_SITE_OTHER): Payer: Medicare Other | Admitting: Critical Care Medicine

## 2014-02-06 ENCOUNTER — Encounter: Payer: Self-pay | Admitting: Critical Care Medicine

## 2014-02-06 VITALS — BP 116/80 | HR 74 | Ht 61.0 in | Wt 132.4 lb

## 2014-02-06 DIAGNOSIS — J45909 Unspecified asthma, uncomplicated: Secondary | ICD-10-CM | POA: Diagnosis not present

## 2014-02-06 NOTE — Patient Instructions (Signed)
No change in medications. Return in        6 months        

## 2014-02-06 NOTE — Progress Notes (Signed)
Subjective:    Patient ID: [name], female    DOB: 10-Apr-1941, 73 y.o.   MRN: [id number]  HPI   73 y.o. WF  never smoker with  H/o asthma/ allergies to dust mold  and referred by Dr Rex Kras 10/21/2011 for pulmonary evaluation for difficult to control asthma   02/06/2014 Chief Complaint  Patient presents with  . 4 month follow up    Currently taking cheom txs.  Feels sinus pressure on right side and PND - worse qhs.  Cough now with thick, off white mucus.   Pt getting Chem[facility] at [facility] site.  Now on IV chemorx.  Notes some sinus pressure and some mucus. Pt notes some H/A.  Rx tylenol.  Uses sinus rinse and swells in the area.     Review of Systems  Constitutional:   No  weight loss, night sweats,  Fevers, chills, fatigue, lassitude. HEENT:   No headaches,  Difficulty swallowing,  Tooth/dental problems,  - Sore throat,                No sneezing, itching, ear ache,- nasal congestion, -post nasal drip,   CV:  No chest pain,  Orthopnea, PND, swelling in lower extremities, anasarca, dizziness, palpitations  GI  No heartburn, indigestion, abdominal pain, nausea, vomiting, diarrhea, change in bowel habits, loss of appetite  Resp: Notes shortness of breath with exertion not at rest.  No wheezing.  No chest wall deformity  Skin: no rash or lesions.  GU: no dysuria, change in color of urine, no urgency or frequency.  No flank pain.  MS:  No joint pain or swelling.  No decreased range of motion.  No back pain.  Psych:  No change in mood or affect. No depression or anxiety.  No memory loss.     Objective:   Physical Exam  Filed Vitals:   02/06/14 1221  BP: 116/80  Pulse: 74  Height: 5\' 1"  (1.549 m)  Weight: 132 lb 6.4 oz (60.056 kg)  SpO2: 96%    Gen: Pleasant, well-nourished, in no distress,  normal affect  ENT: No lesions,  mouth clear,  oropharynx clear,no purulence in nares  Neck: No JVD, no TMG, no carotid bruits  Lungs: No use of accessory muscles, no  dullness to percussion, distant breath sounds  Cardiovascular: RRR, heart sounds normal, no murmur or gallops, no peripheral edema  Abdomen: soft and NT, no HSM,  BS normal  Musculoskeletal: No deformities, no cyanosis or clubbing  Neuro: alert, non focal  Skin: Warm, no lesions or rashes     Assessment & Plan:   Moderate persistent asthma with primary cough variation Moderate persistent asthma with cough variation now improved Plan Maintain inhaled medications as prescribed 2 puff daily Asmanex    Updated Medication List Outpatient Encounter Prescriptions as of 02/06/2014  Medication Sig  . acetaminophen (TYLENOL) 325 MG tablet Take 325 mg by mouth every 6 (six) hours as needed for pain.  [name] albuterol (VENTOLIN HFA) 108 (90 BASE) MCG/ACT inhaler Inhale 2 puffs into the lungs every 6 (six) hours as needed for wheezing or shortness of breath.   . ALPRAZolam (XANAX) 0.5 MG tablet Take 0.5 mg by mouth daily as needed for anxiety.   [name] amLODipine (NORVASC) 5 MG tablet Take 1 tablet (5 mg total) by mouth daily.  [name] aspirin 81 MG chewable tablet Chew 81 mg by mouth at bedtime.   . clonazePAM (KLONOPIN) 0.5 MG tablet Take 0.25-0.5 mg by mouth at  bedtime as needed for anxiety.   . fexofenadine (ALLEGRA) 180 MG tablet Take 90 mg by mouth daily.  . fluticasone (FLONASE) 50 MCG/ACT nasal spray Place 1 spray into the nose as needed.  [name] levothyroxine (SYNTHROID, LEVOTHROID) 125 MCG tablet Take 125 mcg by mouth daily.    [name] MICARDIS 40 MG tablet Take 1 tablet (40 mg total) by mouth daily.  . mometasone (ASMANEX) 220 MCG/INH inhaler Inhale 2 puffs into the lungs daily.  . Omega-3 Fatty Acids (FISH OIL PO) Take by mouth 2 (two) times daily.  [name] PATANOL 0.1 % ophthalmic solution Place 1 drop into the left eye 2 (two) times daily.  . simvastatin (ZOCOR) 20 MG tablet Take 1 tablet (20 mg total) by mouth every evening.  . [DISCONTINUED] fluticasone (CUTIVATE) 0.05 % cream

## 2014-02-07 DIAGNOSIS — Z0000 Encounter for general adult medical examination without abnormal findings: Secondary | ICD-10-CM | POA: Diagnosis not present

## 2014-02-07 DIAGNOSIS — H01029 Squamous blepharitis unspecified eye, unspecified eyelid: Secondary | ICD-10-CM | POA: Diagnosis not present

## 2014-02-07 DIAGNOSIS — R319 Hematuria, unspecified: Secondary | ICD-10-CM | POA: Diagnosis not present

## 2014-02-07 DIAGNOSIS — N189 Chronic kidney disease, unspecified: Secondary | ICD-10-CM | POA: Diagnosis not present

## 2014-02-07 DIAGNOSIS — E785 Hyperlipidemia, unspecified: Secondary | ICD-10-CM | POA: Diagnosis not present

## 2014-02-07 DIAGNOSIS — E018 Other iodine-deficiency related thyroid disorders and allied conditions: Secondary | ICD-10-CM | POA: Diagnosis not present

## 2014-02-07 DIAGNOSIS — N63 Unspecified lump in unspecified breast: Secondary | ICD-10-CM | POA: Diagnosis not present

## 2014-02-07 DIAGNOSIS — C9000 Multiple myeloma not having achieved remission: Secondary | ICD-10-CM | POA: Diagnosis not present

## 2014-02-07 DIAGNOSIS — I6350 Cerebral infarction due to unspecified occlusion or stenosis of unspecified cerebral artery: Secondary | ICD-10-CM | POA: Diagnosis not present

## 2014-02-07 NOTE — Assessment & Plan Note (Signed)
Moderate persistent asthma with cough variation now improved Plan Maintain inhaled medications as prescribed 2 puff daily Asmanex

## 2014-02-11 DIAGNOSIS — F411 Generalized anxiety disorder: Secondary | ICD-10-CM | POA: Diagnosis not present

## 2014-02-11 DIAGNOSIS — R112 Nausea with vomiting, unspecified: Secondary | ICD-10-CM | POA: Diagnosis not present

## 2014-02-11 DIAGNOSIS — E860 Dehydration: Secondary | ICD-10-CM | POA: Diagnosis not present

## 2014-02-11 DIAGNOSIS — Z634 Disappearance and death of family member: Secondary | ICD-10-CM | POA: Diagnosis not present

## 2014-02-11 DIAGNOSIS — C9000 Multiple myeloma not having achieved remission: Secondary | ICD-10-CM | POA: Diagnosis not present

## 2014-02-11 DIAGNOSIS — Z5111 Encounter for antineoplastic chemotherapy: Secondary | ICD-10-CM | POA: Diagnosis not present

## 2014-02-12 ENCOUNTER — Ambulatory Visit: Payer: Medicare Other | Admitting: Oncology

## 2014-02-25 DIAGNOSIS — R51 Headache: Secondary | ICD-10-CM | POA: Diagnosis not present

## 2014-02-25 DIAGNOSIS — Z09 Encounter for follow-up examination after completed treatment for conditions other than malignant neoplasm: Secondary | ICD-10-CM | POA: Diagnosis not present

## 2014-02-25 DIAGNOSIS — E860 Dehydration: Secondary | ICD-10-CM | POA: Diagnosis not present

## 2014-02-25 DIAGNOSIS — Z5111 Encounter for antineoplastic chemotherapy: Secondary | ICD-10-CM | POA: Diagnosis not present

## 2014-02-25 DIAGNOSIS — C9000 Multiple myeloma not having achieved remission: Secondary | ICD-10-CM | POA: Diagnosis not present

## 2014-02-25 DIAGNOSIS — R112 Nausea with vomiting, unspecified: Secondary | ICD-10-CM | POA: Diagnosis not present

## 2014-03-01 DIAGNOSIS — T148 Other injury of unspecified body region: Secondary | ICD-10-CM | POA: Diagnosis not present

## 2014-03-01 DIAGNOSIS — W57XXXA Bitten or stung by nonvenomous insect and other nonvenomous arthropods, initial encounter: Secondary | ICD-10-CM | POA: Diagnosis not present

## 2014-03-04 DIAGNOSIS — Z5111 Encounter for antineoplastic chemotherapy: Secondary | ICD-10-CM | POA: Diagnosis not present

## 2014-03-04 DIAGNOSIS — M25579 Pain in unspecified ankle and joints of unspecified foot: Secondary | ICD-10-CM | POA: Diagnosis not present

## 2014-03-04 DIAGNOSIS — M79609 Pain in unspecified limb: Secondary | ICD-10-CM | POA: Diagnosis not present

## 2014-03-04 DIAGNOSIS — M2010 Hallux valgus (acquired), unspecified foot: Secondary | ICD-10-CM | POA: Diagnosis not present

## 2014-03-04 DIAGNOSIS — E860 Dehydration: Secondary | ICD-10-CM | POA: Diagnosis not present

## 2014-03-04 DIAGNOSIS — M19079 Primary osteoarthritis, unspecified ankle and foot: Secondary | ICD-10-CM | POA: Diagnosis not present

## 2014-03-04 DIAGNOSIS — C9000 Multiple myeloma not having achieved remission: Secondary | ICD-10-CM | POA: Diagnosis not present

## 2014-03-04 DIAGNOSIS — R112 Nausea with vomiting, unspecified: Secondary | ICD-10-CM | POA: Diagnosis not present

## 2014-03-11 DIAGNOSIS — R112 Nausea with vomiting, unspecified: Secondary | ICD-10-CM | POA: Diagnosis not present

## 2014-03-11 DIAGNOSIS — T148 Other injury of unspecified body region: Secondary | ICD-10-CM | POA: Diagnosis not present

## 2014-03-11 DIAGNOSIS — E860 Dehydration: Secondary | ICD-10-CM | POA: Diagnosis not present

## 2014-03-11 DIAGNOSIS — Z5111 Encounter for antineoplastic chemotherapy: Secondary | ICD-10-CM | POA: Diagnosis not present

## 2014-03-11 DIAGNOSIS — W57XXXA Bitten or stung by nonvenomous insect and other nonvenomous arthropods, initial encounter: Secondary | ICD-10-CM | POA: Diagnosis not present

## 2014-03-11 DIAGNOSIS — F411 Generalized anxiety disorder: Secondary | ICD-10-CM | POA: Diagnosis not present

## 2014-03-11 DIAGNOSIS — C9000 Multiple myeloma not having achieved remission: Secondary | ICD-10-CM | POA: Diagnosis not present

## 2014-03-11 DIAGNOSIS — R450 Nervousness: Secondary | ICD-10-CM | POA: Diagnosis not present

## 2014-03-14 DIAGNOSIS — H2510 Age-related nuclear cataract, unspecified eye: Secondary | ICD-10-CM | POA: Diagnosis not present

## 2014-03-14 DIAGNOSIS — C6930 Malignant neoplasm of unspecified choroid: Secondary | ICD-10-CM | POA: Diagnosis not present

## 2014-03-21 DIAGNOSIS — Z8673 Personal history of transient ischemic attack (TIA), and cerebral infarction without residual deficits: Secondary | ICD-10-CM | POA: Diagnosis not present

## 2014-03-21 DIAGNOSIS — Z91040 Latex allergy status: Secondary | ICD-10-CM | POA: Diagnosis not present

## 2014-03-21 DIAGNOSIS — Z79899 Other long term (current) drug therapy: Secondary | ICD-10-CM | POA: Diagnosis not present

## 2014-03-21 DIAGNOSIS — J45909 Unspecified asthma, uncomplicated: Secondary | ICD-10-CM | POA: Diagnosis not present

## 2014-03-21 DIAGNOSIS — M810 Age-related osteoporosis without current pathological fracture: Secondary | ICD-10-CM | POA: Diagnosis not present

## 2014-03-21 DIAGNOSIS — C73 Malignant neoplasm of thyroid gland: Secondary | ICD-10-CM | POA: Diagnosis not present

## 2014-03-21 DIAGNOSIS — M129 Arthropathy, unspecified: Secondary | ICD-10-CM | POA: Diagnosis not present

## 2014-03-21 DIAGNOSIS — C9000 Multiple myeloma not having achieved remission: Secondary | ICD-10-CM | POA: Diagnosis not present

## 2014-03-21 DIAGNOSIS — F411 Generalized anxiety disorder: Secondary | ICD-10-CM | POA: Diagnosis not present

## 2014-03-21 DIAGNOSIS — I10 Essential (primary) hypertension: Secondary | ICD-10-CM | POA: Diagnosis not present

## 2014-03-21 DIAGNOSIS — Z882 Allergy status to sulfonamides status: Secondary | ICD-10-CM | POA: Diagnosis not present

## 2014-04-01 DIAGNOSIS — Z5111 Encounter for antineoplastic chemotherapy: Secondary | ICD-10-CM | POA: Diagnosis not present

## 2014-04-01 DIAGNOSIS — E860 Dehydration: Secondary | ICD-10-CM | POA: Diagnosis not present

## 2014-04-01 DIAGNOSIS — C9000 Multiple myeloma not having achieved remission: Secondary | ICD-10-CM | POA: Diagnosis not present

## 2014-04-01 DIAGNOSIS — R450 Nervousness: Secondary | ICD-10-CM | POA: Diagnosis not present

## 2014-04-01 DIAGNOSIS — R112 Nausea with vomiting, unspecified: Secondary | ICD-10-CM | POA: Diagnosis not present

## 2014-04-08 DIAGNOSIS — E860 Dehydration: Secondary | ICD-10-CM | POA: Diagnosis not present

## 2014-04-08 DIAGNOSIS — R112 Nausea with vomiting, unspecified: Secondary | ICD-10-CM | POA: Diagnosis not present

## 2014-04-08 DIAGNOSIS — Z5111 Encounter for antineoplastic chemotherapy: Secondary | ICD-10-CM | POA: Diagnosis not present

## 2014-04-08 DIAGNOSIS — C9000 Multiple myeloma not having achieved remission: Secondary | ICD-10-CM | POA: Diagnosis not present

## 2014-04-08 DIAGNOSIS — C801 Malignant (primary) neoplasm, unspecified: Secondary | ICD-10-CM | POA: Diagnosis not present

## 2014-04-15 DIAGNOSIS — E860 Dehydration: Secondary | ICD-10-CM | POA: Diagnosis not present

## 2014-04-15 DIAGNOSIS — R221 Localized swelling, mass and lump, neck: Secondary | ICD-10-CM | POA: Diagnosis not present

## 2014-04-15 DIAGNOSIS — C9000 Multiple myeloma not having achieved remission: Secondary | ICD-10-CM | POA: Diagnosis not present

## 2014-04-15 DIAGNOSIS — Z5111 Encounter for antineoplastic chemotherapy: Secondary | ICD-10-CM | POA: Diagnosis not present

## 2014-04-15 DIAGNOSIS — R112 Nausea with vomiting, unspecified: Secondary | ICD-10-CM | POA: Diagnosis not present

## 2014-04-15 DIAGNOSIS — R220 Localized swelling, mass and lump, head: Secondary | ICD-10-CM | POA: Diagnosis not present

## 2014-04-22 DIAGNOSIS — L819 Disorder of pigmentation, unspecified: Secondary | ICD-10-CM | POA: Diagnosis not present

## 2014-04-22 DIAGNOSIS — D235 Other benign neoplasm of skin of trunk: Secondary | ICD-10-CM | POA: Diagnosis not present

## 2014-04-22 DIAGNOSIS — Z85820 Personal history of malignant melanoma of skin: Secondary | ICD-10-CM | POA: Diagnosis not present

## 2014-04-23 DIAGNOSIS — R197 Diarrhea, unspecified: Secondary | ICD-10-CM | POA: Diagnosis not present

## 2014-04-23 DIAGNOSIS — R5381 Other malaise: Secondary | ICD-10-CM | POA: Diagnosis not present

## 2014-04-23 DIAGNOSIS — R269 Unspecified abnormalities of gait and mobility: Secondary | ICD-10-CM | POA: Diagnosis not present

## 2014-04-23 DIAGNOSIS — R209 Unspecified disturbances of skin sensation: Secondary | ICD-10-CM | POA: Diagnosis not present

## 2014-04-23 DIAGNOSIS — R635 Abnormal weight gain: Secondary | ICD-10-CM | POA: Diagnosis not present

## 2014-04-23 DIAGNOSIS — C9000 Multiple myeloma not having achieved remission: Secondary | ICD-10-CM | POA: Diagnosis not present

## 2014-04-23 DIAGNOSIS — R5383 Other fatigue: Secondary | ICD-10-CM | POA: Diagnosis not present

## 2014-04-23 DIAGNOSIS — R61 Generalized hyperhidrosis: Secondary | ICD-10-CM | POA: Diagnosis not present

## 2014-04-23 DIAGNOSIS — K5900 Constipation, unspecified: Secondary | ICD-10-CM | POA: Diagnosis not present

## 2014-04-27 ENCOUNTER — Emergency Department (INDEPENDENT_AMBULATORY_CARE_PROVIDER_SITE_OTHER)
Admission: EM | Admit: 2014-04-27 | Discharge: 2014-04-27 | Disposition: A | Discharge status: Home / Self Care | Payer: Medicare Other | Source: Home / Self Care

## 2014-04-27 ENCOUNTER — Encounter (HOSPITAL_COMMUNITY): Payer: Self-pay | Admitting: Emergency Medicine

## 2014-04-27 DIAGNOSIS — L299 Pruritus, unspecified: Secondary | ICD-10-CM

## 2014-04-27 DIAGNOSIS — S90569A Insect bite (nonvenomous), unspecified ankle, initial encounter: Secondary | ICD-10-CM

## 2014-04-27 DIAGNOSIS — W57XXXA Bitten or stung by nonvenomous insect and other nonvenomous arthropods, initial encounter: Secondary | ICD-10-CM

## 2014-04-27 DIAGNOSIS — S90562A Insect bite (nonvenomous), left ankle, initial encounter: Secondary | ICD-10-CM

## 2014-04-27 NOTE — ED Provider Notes (Signed)
Medical screening examination/treatment/procedure(s) were performed by a resident physician or non-physician practitioner and as the supervising physician I was immediately available for consultation/collaboration.  Linna Darner, MD Family Medicine   Waldemar Dickens, MD 04/27/14 [phone]

## 2014-04-27 NOTE — Discharge Instructions (Signed)
Insect Bite Apply ice to the ankle several times a day Allegra daily Hydrocortisone cream to the bite area Elevate leg during the day, May walk as needed For signs of infection seek medical attention promptly Mosquitoes, flies, fleas, bedbugs, and many other insects can bite. Insect bites are different from insect stings. A sting is when venom is injected into the skin. Some insect bites can transmit infectious diseases. SYMPTOMS  Insect bites usually turn red, swell, and itch for 2 to 4 days. They often go away on their own. TREATMENT  Your caregiver may prescribe antibiotic medicines if a bacterial infection develops in the bite. HOME CARE INSTRUCTIONS  Do not scratch the bite area.  Keep the bite area clean and dry. Wash the bite area thoroughly with soap and water.  Put ice or cool compresses on the bite area.  Put ice in a plastic bag.  Place a towel between your skin and the bag.  Leave the ice on for 20 minutes, 4 times a day for the first 2 to 3 days, or as directed.  You may apply a baking soda paste, cortisone cream, or calamine lotion to the bite area as directed by your caregiver. This can help reduce itching and swelling.  Only take over-the-counter or prescription medicines as directed by your caregiver.  If you are given antibiotics, take them as directed. Finish them even if you start to feel better. You may need a tetanus shot if:  You cannot remember when you had your last tetanus shot.  You have never had a tetanus shot.  The injury broke your skin. If you get a tetanus shot, your arm may swell, get red, and feel warm to the touch. This is common and not a problem. If you need a tetanus shot and you choose not to have one, there is a rare chance of getting tetanus. Sickness from tetanus can be serious. SEEK IMMEDIATE MEDICAL CARE IF:   You have increased pain, redness, or swelling in the bite area.  You see a red line on the skin coming from the  bite.  You have a fever.  You have joint pain.  You have a headache or neck pain.  You have unusual weakness.  You have a rash.  You have chest pain or shortness of breath.  You have abdominal pain, nausea, or vomiting.  You feel unusually tired or sleepy. MAKE SURE YOU:   Understand these instructions.  Will watch your condition.  Will get help right away if you are not doing well or get worse. Document Released: 10/28/2004 Document Revised: 12/13/2011 Document Reviewed: 04/21/2011 [facility] Patient Information 2015 [place], [place]. This information is not intended to replace advice given to you by your health care provider. Make sure you discuss any questions you have with your health care provider.

## 2014-04-27 NOTE — ED Provider Notes (Signed)
CSN: [id number]     Arrival date & time 04/27/14  1425 History   First MD Initiated Contact with Patient 04/27/14 1526     Chief Complaint  Patient presents with  . Insect Bite   (Consider location/radiation/quality/duration/timing/severity/associated sxs/prior Treatment) HPI Comments: 73 year old female states she saw something fall from the ceiling down to her right ankle. Shortly after that she noticed some mild aching and swelling to the lateral aspect of the right ankle. 2 hours later she had mild generalized itching. Overnight the swelling in the ankle increased. Complains of mild local tenderness.   Past Medical History  Diagnosis Date  . 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 History of blood clots     LEG  . Stroke   . Hernia   . MGUS (monoclonal gammopathy of unknown significance)   . Chronic kidney disease   . Hypertension   . Hyperlipidemia   . Thyroid disease   . Arthritis   . Mitral valve prolapse   . Anxiety   . Perforated bowel   . Peritonitis   . Diverticulosis     Pt reported on 03/15/12  . MGUS (monoclonal gammopathy of unknown significance)   . Cancer     THYROID, SKIN, NOSE, BONE  . Melanoma   . Melanoma 1989    chest   Past Surgical History  Procedure Laterality Date  . Abdominal hysterectomy    . Rotator cuff repair  2004    RIGHT SHOULDER  . Hernia repair    . Leg surgery      VEIN & BLOOD CLOT  . Cardiovascular stress test  2006    NORMAL  . Transthoracic echocardiogram  2006  . Melanoma excision      R eye  . Skin cancer excision  2011, 2013    squamous cell of nose x 2  . Breast lumpectomy  12/2010    R breast  . Breast lumpectomy  2004    L axilla neg for cancer.   Family History  Problem Relation Age of Onset  . Stroke Mother   . Asthma Father   . Diabetes Father   . Cancer Father   . Heart failure Father   . Cancer Sister   . Diabetes Sister   . Cancer Brother   . Cancer Father     Prostate and kidney cancer  .  Stroke Mother 61    Her mother passed away from this stroke at the age of 31  . Cervical cancer Sister   . Cancer Sister     Recurrent cancer involving the neck and the jaw   History  Substance Use Topics  . Smoking status: Never Smoker   . Smokeless tobacco: Never Used  . Alcohol Use: No   OB History   Grav Para Term Preterm Abortions TAB SAB Ect Mult Living                 Review of Systems  Constitutional: Negative for fever, chills, activity change and fatigue.  HENT: Negative.   Respiratory: Negative for chest tightness and shortness of breath.   Cardiovascular: Negative for chest pain, palpitations and leg swelling.  Gastrointestinal: Negative.   Musculoskeletal: Negative.   Skin: Negative for rash.  Neurological: Negative for dizziness, tremors, syncope, speech difficulty, light-headedness and headaches.    Allergies  Doxycycline; Aldactone; Hydrocodone; Levofloxacin; Prednisone; Pseudoephedrine; Quinolones; Sertraline; Sertraline hcl; Sudafed; Ultram; Adhesive; and Latex  Home Medications   Prior  to Admission medications   Medication Sig Start Date End Date Taking? Authorizing Provider  ALPRAZolam Duanne Moron) 0.5 MG tablet Take 0.5 mg by mouth daily as needed for anxiety.  07/26/11  Yes Historical Provider, MD  amLODipine (NORVASC) 5 MG tablet Take 1 tablet (5 mg total) by mouth daily. 11/07/12  Yes Darlin Coco, MD  aspirin 81 MG chewable tablet Chew 81 mg by mouth at bedtime.  07/26/11  Yes Historical Provider, MD  clonazePAM (KLONOPIN) 0.5 MG tablet Take 0.25-0.5 mg by mouth at bedtime as needed for anxiety.  05/06/12  Yes Historical Provider, MD  fexofenadine (ALLEGRA) 180 MG tablet Take 90 mg by mouth daily. 07/09/13  Yes Elsie Stain, MD  levothyroxine (SYNTHROID, LEVOTHROID) 125 MCG tablet Take 125 mcg by mouth daily.     Yes Historical Provider, MD  MICARDIS 40 MG tablet Take 1 tablet (40 mg total) by mouth daily. 11/27/12  Yes Darlin Coco, MD  simvastatin  (ZOCOR) 20 MG tablet Take 1 tablet (20 mg total) by mouth every evening. 12/10/13 12/10/14 Yes Darlin Coco, MD  acetaminophen (TYLENOL) 325 MG tablet Take 325 mg by mouth every 6 (six) hours as needed for pain.    Historical Provider, MD  albuterol (VENTOLIN HFA) 108 (90 BASE) MCG/ACT inhaler Inhale 2 puffs into the lungs every 6 (six) hours as needed for wheezing or shortness of breath.     Historical Provider, MD  fluticasone (FLONASE) 50 MCG/ACT nasal spray Place 1 spray into the nose as needed.    Historical Provider, MD  mometasone [facility]) 220 MCG/INH inhaler Inhale 2 puffs into the lungs daily.    Historical Provider, MD  Omega-3 Fatty Acids (FISH OIL PO) Take by mouth 2 (two) times daily.    Historical Provider, MD  PATANOL 0.1 % ophthalmic solution Place 1 drop into the left eye 2 (two) times daily.    Historical Provider, MD   BP 161/82  Pulse 73  Temp(Src) 98 F (36.7 C) (Oral)  SpO2 96% Physical Exam  Nursing note and vitals reviewed. Constitutional: She is oriented to person, place, and time. She appears well-developed and well-nourished. No distress.  HENT:  Nose: Nose normal.  Mouth/Throat: Oropharynx is clear and moist. No oropharyngeal exudate.  Eyes: Conjunctivae and EOM are normal.  Neck: Normal range of motion. Neck supple.  Cardiovascular: Normal rate, regular rhythm and normal heart sounds.   Pulmonary/Chest: Effort normal and breath sounds normal. No respiratory distress. She has no wheezes. She has no rales.  Musculoskeletal: She exhibits no edema.  Neurological: She is alert and oriented to person, place, and time. She exhibits normal muscle tone.  Skin: Skin is warm and dry.  Right lateral ankle with localized puffiness and mild tenderness. Light ecchymosis, no redness or lymphangitis. No open, draining or bleeding areas. Overlying skin is intact, smooth.  Psychiatric: She has a normal mood and affect.    ED Course  Procedures (including critical care  time) Labs Review Labs Reviewed - No data to display  Imaging Review No results found.   MDM   1. Insect bite of ankle with local reaction, left, initial encounter   2. Itching        Apply ice to the ankle several times a day Allegra daily Hydrocortisone cream to the bite area Elevate leg during the day, May walk as needed For signs of infection seek medical attention promptly  Janne Napoleon, NP 04/27/14 1549

## 2014-04-27 NOTE — ED Notes (Signed)
Pt reports poss spider bite to lateral part of right ankle onset last night Reports she saw something brown; did not feel the actual sting/bite Sx include swelling, tenderness and bruising Denies inj/trauma; ambulated well to exam room w/NAD Alert w/no signs of acute distress.

## 2014-04-29 DIAGNOSIS — R112 Nausea with vomiting, unspecified: Secondary | ICD-10-CM | POA: Diagnosis not present

## 2014-04-29 DIAGNOSIS — C9000 Multiple myeloma not having achieved remission: Secondary | ICD-10-CM | POA: Diagnosis not present

## 2014-04-29 DIAGNOSIS — Z5111 Encounter for antineoplastic chemotherapy: Secondary | ICD-10-CM | POA: Diagnosis not present

## 2014-04-29 DIAGNOSIS — E860 Dehydration: Secondary | ICD-10-CM | POA: Diagnosis not present

## 2014-05-04 DIAGNOSIS — T148 Other injury of unspecified body region: Secondary | ICD-10-CM | POA: Diagnosis not present

## 2014-05-04 DIAGNOSIS — W57XXXA Bitten or stung by nonvenomous insect and other nonvenomous arthropods, initial encounter: Secondary | ICD-10-CM | POA: Diagnosis not present

## 2014-05-06 DIAGNOSIS — E860 Dehydration: Secondary | ICD-10-CM | POA: Diagnosis not present

## 2014-05-06 DIAGNOSIS — C9000 Multiple myeloma not having achieved remission: Secondary | ICD-10-CM | POA: Diagnosis not present

## 2014-05-06 DIAGNOSIS — Z5111 Encounter for antineoplastic chemotherapy: Secondary | ICD-10-CM | POA: Diagnosis not present

## 2014-05-06 DIAGNOSIS — R112 Nausea with vomiting, unspecified: Secondary | ICD-10-CM | POA: Diagnosis not present

## 2014-05-09 DIAGNOSIS — H01009 Unspecified blepharitis unspecified eye, unspecified eyelid: Secondary | ICD-10-CM | POA: Diagnosis not present

## 2014-05-09 DIAGNOSIS — H35379 Puckering of macula, unspecified eye: Secondary | ICD-10-CM | POA: Diagnosis not present

## 2014-05-09 DIAGNOSIS — C73 Malignant neoplasm of thyroid gland: Secondary | ICD-10-CM | POA: Diagnosis not present

## 2014-05-09 DIAGNOSIS — H3500 Unspecified background retinopathy: Secondary | ICD-10-CM | POA: Diagnosis not present

## 2014-05-09 DIAGNOSIS — Z5189 Encounter for other specified aftercare: Secondary | ICD-10-CM | POA: Diagnosis not present

## 2014-05-09 DIAGNOSIS — D472 Monoclonal gammopathy: Secondary | ICD-10-CM | POA: Diagnosis not present

## 2014-05-09 DIAGNOSIS — C6930 Malignant neoplasm of unspecified choroid: Secondary | ICD-10-CM | POA: Diagnosis not present

## 2014-05-09 DIAGNOSIS — H353190 Nonexudative age-related macular degeneration, unspecified eye, stage unspecified: Secondary | ICD-10-CM | POA: Diagnosis not present

## 2014-05-09 DIAGNOSIS — H4310 Vitreous hemorrhage, unspecified eye: Secondary | ICD-10-CM | POA: Diagnosis not present

## 2014-05-13 DIAGNOSIS — E860 Dehydration: Secondary | ICD-10-CM | POA: Diagnosis not present

## 2014-05-13 DIAGNOSIS — R112 Nausea with vomiting, unspecified: Secondary | ICD-10-CM | POA: Diagnosis not present

## 2014-05-13 DIAGNOSIS — Z5111 Encounter for antineoplastic chemotherapy: Secondary | ICD-10-CM | POA: Diagnosis not present

## 2014-05-13 DIAGNOSIS — C9000 Multiple myeloma not having achieved remission: Secondary | ICD-10-CM | POA: Diagnosis not present

## 2014-05-17 DIAGNOSIS — I129 Hypertensive chronic kidney disease with stage 1 through stage 4 chronic kidney disease, or unspecified chronic kidney disease: Secondary | ICD-10-CM | POA: Diagnosis not present

## 2014-05-21 ENCOUNTER — Ambulatory Visit: Payer: Medicare Other | Admitting: Cardiology

## 2014-05-21 ENCOUNTER — Other Ambulatory Visit: Payer: Medicare Other

## 2014-05-27 DIAGNOSIS — Z5111 Encounter for antineoplastic chemotherapy: Secondary | ICD-10-CM | POA: Diagnosis not present

## 2014-05-27 DIAGNOSIS — L659 Nonscarring hair loss, unspecified: Secondary | ICD-10-CM | POA: Diagnosis not present

## 2014-05-27 DIAGNOSIS — C9000 Multiple myeloma not having achieved remission: Secondary | ICD-10-CM | POA: Diagnosis not present

## 2014-05-27 DIAGNOSIS — R112 Nausea with vomiting, unspecified: Secondary | ICD-10-CM | POA: Diagnosis not present

## 2014-05-27 DIAGNOSIS — E860 Dehydration: Secondary | ICD-10-CM | POA: Diagnosis not present

## 2014-06-03 DIAGNOSIS — R112 Nausea with vomiting, unspecified: Secondary | ICD-10-CM | POA: Diagnosis not present

## 2014-06-03 DIAGNOSIS — E860 Dehydration: Secondary | ICD-10-CM | POA: Diagnosis not present

## 2014-06-03 DIAGNOSIS — Z5111 Encounter for antineoplastic chemotherapy: Secondary | ICD-10-CM | POA: Diagnosis not present

## 2014-06-03 DIAGNOSIS — C9000 Multiple myeloma not having achieved remission: Secondary | ICD-10-CM | POA: Diagnosis not present

## 2014-06-11 DIAGNOSIS — E860 Dehydration: Secondary | ICD-10-CM | POA: Diagnosis not present

## 2014-06-11 DIAGNOSIS — Z5111 Encounter for antineoplastic chemotherapy: Secondary | ICD-10-CM | POA: Diagnosis not present

## 2014-06-11 DIAGNOSIS — C9000 Multiple myeloma not having achieved remission: Secondary | ICD-10-CM | POA: Diagnosis not present

## 2014-06-11 DIAGNOSIS — R112 Nausea with vomiting, unspecified: Secondary | ICD-10-CM | POA: Diagnosis not present

## 2014-06-14 ENCOUNTER — Other Ambulatory Visit: Payer: Self-pay | Admitting: Obstetrics and Gynecology

## 2014-06-14 DIAGNOSIS — Z9189 Other specified personal risk factors, not elsewhere classified: Secondary | ICD-10-CM | POA: Diagnosis not present

## 2014-06-18 ENCOUNTER — Other Ambulatory Visit (INDEPENDENT_AMBULATORY_CARE_PROVIDER_SITE_OTHER): Payer: Medicare Other

## 2014-06-18 ENCOUNTER — Ambulatory Visit (INDEPENDENT_AMBULATORY_CARE_PROVIDER_SITE_OTHER): Payer: Medicare Other | Admitting: Cardiology

## 2014-06-18 ENCOUNTER — Encounter: Payer: Self-pay | Admitting: Cardiology

## 2014-06-18 VITALS — BP 136/74 | HR 68 | Ht 61.0 in | Wt 134.0 lb

## 2014-06-18 DIAGNOSIS — C9000 Multiple myeloma not having achieved remission: Secondary | ICD-10-CM

## 2014-06-18 DIAGNOSIS — E039 Hypothyroidism, unspecified: Secondary | ICD-10-CM | POA: Diagnosis not present

## 2014-06-18 DIAGNOSIS — I119 Hypertensive heart disease without heart failure: Secondary | ICD-10-CM

## 2014-06-18 DIAGNOSIS — E7800 Pure hypercholesterolemia, unspecified: Secondary | ICD-10-CM

## 2014-06-18 LAB — LIPID PANEL
Cholesterol: 199 mg/dL (ref 0–200)
HDL: 52.9 mg/dL (ref >39.00)
LDL CALC: 122 mg/dL — AB (ref 0–99)
NONHDL: 146.1
Total CHOL/HDL Ratio: 4
Triglycerides: 120 mg/dL (ref 0.0–149.0)
VLDL: 24 mg/dL (ref 0.0–40.0)

## 2014-06-18 LAB — BASIC METABOLIC PANEL
BUN: 16 mg/dL (ref 6–23)
CO2: 27 meq/L (ref 19–32)
Calcium: 9.3 mg/dL (ref 8.4–10.5)
Chloride: 107 mEq/L (ref 96–112)
Creatinine, Ser: 0.7 mg/dL (ref 0.4–1.2)
GFR: 91.18 mL/min (ref >60.00)
GLUCOSE: 86 mg/dL (ref 70–99)
POTASSIUM: 3.7 meq/L (ref 3.5–5.1)
SODIUM: 142 meq/L (ref 135–145)

## 2014-06-18 LAB — HEPATIC FUNCTION PANEL
ALT: 14 U/L (ref 0–35)
AST: 16 U/L (ref 0–37)
Albumin: 4 g/dL (ref 3.5–5.2)
Alkaline Phosphatase: 32 U/L — ABNORMAL LOW (ref 39–117)
BILIRUBIN DIRECT: 0.1 mg/dL (ref 0.0–0.3)
BILIRUBIN TOTAL: 1.6 mg/dL — AB (ref 0.2–1.2)
Total Protein: 7.3 g/dL (ref 6.0–8.3)

## 2014-06-18 LAB — CYTOLOGY - PAP

## 2014-06-18 NOTE — Assessment & Plan Note (Signed)
The patient is clinically euthyroid on current therapy

## 2014-06-18 NOTE — Progress Notes (Signed)
Quick Note:  Please report to patient. The recent labs are stable. Continue same medication and careful diet. Kidney function is normal. ______

## 2014-06-18 NOTE — Patient Instructions (Signed)
Your physician recommends that you continue on your current medications as directed. Please refer to the Current Medication list given to you today.  Your physician wants you to follow-up in: 4 months with fasting labs (lp/bmet/hfp) You will receive a reminder letter in the mail two months in advance. If you don't receive a letter, please call our office to schedule the follow-up appointment.  

## 2014-06-18 NOTE — Assessment & Plan Note (Signed)
The patient has history of hypercholesterolemia.  She is on simvastatin.  She has not been experiencing any myalgias.  We are checking lab work today.

## 2014-06-18 NOTE — Progress Notes (Signed)
[name] Date of Birth:  August 05, 1941 [facility] [address] [place], [place]  [address] [phone]        Fax   [phone]   History of Present Illness: This pleasant 73 year old woman is seen for followup office visit. She has a past history of essential hypertension. She also has a past history of hypercholesterolemia and a past history of melanoma as well as thyroid cancer. She has had previous atypical chest pain. She had a normal nuclear stress test in 2006.  She is being treated for multiple myeloma at [facility].  She initially was treated with oral Revlimid but after 3 weeks did not tolerate it because of side effects.  She is now receiving intravenous chemotherapy intermittently through a Port-A-Cath in her right chest.  She has been treated with steroids and has had a 9 pound weight gain since last visit.   Current Outpatient Prescriptions  Medication Sig Dispense Refill  . acetaminophen (TYLENOL) 325 MG tablet Take 325 mg by mouth every 6 (six) hours as needed for pain.      [name] albuterol (VENTOLIN HFA) 108 (90 BASE) MCG/ACT inhaler Inhale 2 puffs into the lungs every 6 (six) hours as needed for wheezing or shortness of breath.       . ALPRAZolam (XANAX) 0.5 MG tablet Take 0.5 mg by mouth daily as needed for anxiety.       [name] amLODipine (NORVASC) 5 MG tablet Take 1 tablet (5 mg total) by mouth daily.  90 tablet  3  . aspirin 81 MG chewable tablet Chew 81 mg by mouth at bedtime.       . clonazePAM (KLONOPIN) 0.5 MG tablet Take 0.25-0.5 mg by mouth at bedtime as needed for anxiety.       . fexofenadine (ALLEGRA) 180 MG tablet Take 90 mg by mouth daily.      . fluticasone (FLONASE) 50 MCG/ACT nasal spray Place 1 spray into the nose as needed.      [name] levothyroxine (SYNTHROID, LEVOTHROID) 125 MCG tablet Take 125 mcg by mouth daily.        [name] MICARDIS 40 MG tablet Take 1 tablet (40 mg total) by mouth daily.  90 tablet  3  . mometasone (ASMANEX)  220 MCG/INH inhaler Inhale 2 puffs into the lungs daily.      . Omega-3 Fatty Acids (FISH OIL PO) Take by mouth 2 (two) times daily.      [name] PATANOL 0.1 % ophthalmic solution Place 1 drop into the left eye 2 (two) times daily.      . simvastatin (ZOCOR) 20 MG tablet Take 1 tablet (20 mg total) by mouth every evening.  90 tablet  3   No current facility-administered medications for this visit.    Allergies  Allergen Reactions  . Doxycycline     SEVERE HEADACHES  . Aldactone [Spironolactone]     Burning in stomach Burning in stomach  . Hydrocodone   . Levofloxacin   . Prednisone   . Pseudoephedrine   . Quinolones   . Sertraline   . Sertraline Hcl   . Sudafed [Pseudoephedrine Hcl]   . Ultram [Tramadol]     Headaches  Headaches   . Adhesive [Tape] Rash  . Latex Rash    Patient Active Problem List   Diagnosis Date Noted  . Multiple myeloma without remission 11/01/2013  . MGUS (monoclonal gammopathy of unknown significance) 11/26/2011  . Moderate persistent asthma with primary  cough variation 09/21/2011  . Benign hypertensive heart disease without heart failure 03/31/2011  . Hypercholesterolemia 03/31/2011  . Hypothyroidism 03/31/2011  . History of melanoma 03/31/2011  . Hx of thyroid cancer 03/31/2011    History  Smoking status  . Never Smoker   Smokeless tobacco  . Never Used    History  Alcohol Use No    Family History  Problem Relation Age of Onset  . Stroke Mother   . Asthma Father   . Diabetes Father   . Cancer Father   . Heart failure Father   . Cancer Sister   . Diabetes Sister   . Cancer Brother   . Cancer Father     Prostate and kidney cancer  . Stroke Mother 33    Her mother passed away from this stroke at the age of 3  . Cervical cancer Sister   . Cancer Sister     Recurrent cancer involving the neck and the jaw    Review of Systems: Constitutional: no fever chills diaphoresis or fatigue or change in weight.  Head and neck: no hearing  loss, no epistaxis, no photophobia or visual disturbance. Respiratory: No cough, shortness of breath or wheezing. Cardiovascular: No chest pain peripheral edema, palpitations. Gastrointestinal: No abdominal distention, no abdominal pain, no change in bowel habits hematochezia or melena. Genitourinary: No dysuria, no frequency, no urgency, no nocturia. Musculoskeletal:No arthralgias, no back pain, no gait disturbance or myalgias. Neurological: No dizziness, no headaches, no numbness, no seizures, no syncope, no weakness, no tremors. Hematologic: No lymphadenopathy, no easy bruising. Psychiatric: No confusion, no hallucinations, no sleep disturbance.    Physical Exam: Filed Vitals:   06/18/14 1038  BP: 136/74  Pulse: 68  The patient appears to be in no distress.  Head and neck exam reveals that the pupils are equal and reactive.  The extraocular movements are full.  There is no scleral icterus.  Mouth and pharynx are benign.  No lymphadenopathy.  No carotid bruits.  The jugular venous pressure is normal.  Thyroid is not enlarged or tender.  Chest is clear to percussion and auscultation.  No rales or rhonchi.  Expansion of the chest is symmetrical.  Small Port-A-Cath in right upper chest. Heart reveals no abnormal lift or heave.  First and second heart sounds are normal.  There is no murmur gallop rub or click.  The abdomen is soft and nontender.  Bowel sounds are normoactive.  There is no hepatosplenomegaly or mass.  There are no abdominal bruits.  Extremities reveal no phlebitis or edema.  Pedal pulses are good.  There is no cyanosis or clubbing.  Neurologic exam is normal strength and no lateralizing weakness.  No sensory deficits.  Integument reveals no rash  EKG shows normal sinus rhythm and is within normal limits.  Assessment / Plan: 1. essential hypertension without heart failure 2. Hypercholesterolemia 3. multiple myeloma 4. history of malignant melanoma 5. history of  thyroid cancer  Disposition: Continue same medication.  Recheck in 4 months for office visit lipid panel hepatic function panel and basal metabolic panel.

## 2014-06-18 NOTE — Assessment & Plan Note (Signed)
The patient is not having any chest pain or increased shortness of breath.  No palpitations dizziness or syncope.

## 2014-06-21 DIAGNOSIS — N182 Chronic kidney disease, stage 2 (mild): Secondary | ICD-10-CM | POA: Diagnosis not present

## 2014-06-21 DIAGNOSIS — C9000 Multiple myeloma not having achieved remission: Secondary | ICD-10-CM | POA: Diagnosis not present

## 2014-06-21 DIAGNOSIS — I129 Hypertensive chronic kidney disease with stage 1 through stage 4 chronic kidney disease, or unspecified chronic kidney disease: Secondary | ICD-10-CM | POA: Diagnosis not present

## 2014-06-21 DIAGNOSIS — R319 Hematuria, unspecified: Secondary | ICD-10-CM | POA: Diagnosis not present

## 2014-06-24 DIAGNOSIS — E860 Dehydration: Secondary | ICD-10-CM | POA: Diagnosis not present

## 2014-06-24 DIAGNOSIS — C9000 Multiple myeloma not having achieved remission: Secondary | ICD-10-CM | POA: Diagnosis not present

## 2014-06-24 DIAGNOSIS — Z5111 Encounter for antineoplastic chemotherapy: Secondary | ICD-10-CM | POA: Diagnosis not present

## 2014-06-24 DIAGNOSIS — R112 Nausea with vomiting, unspecified: Secondary | ICD-10-CM | POA: Diagnosis not present

## 2014-06-25 ENCOUNTER — Ambulatory Visit: Payer: Medicare Other | Admitting: Internal Medicine

## 2014-06-25 ENCOUNTER — Telehealth: Payer: Self-pay | Admitting: Critical Care Medicine

## 2014-06-25 NOTE — Telephone Encounter (Signed)
Called pt. appt scheduled to come in and see MW 10:45 today.

## 2014-06-26 ENCOUNTER — Other Ambulatory Visit: Payer: Self-pay | Admitting: Dermatology

## 2014-06-26 DIAGNOSIS — Z85828 Personal history of other malignant neoplasm of skin: Secondary | ICD-10-CM | POA: Diagnosis not present

## 2014-06-26 DIAGNOSIS — D2370 Other benign neoplasm of skin of unspecified lower limb, including hip: Secondary | ICD-10-CM | POA: Diagnosis not present

## 2014-06-26 DIAGNOSIS — L821 Other seborrheic keratosis: Secondary | ICD-10-CM | POA: Diagnosis not present

## 2014-06-26 DIAGNOSIS — D1801 Hemangioma of skin and subcutaneous tissue: Secondary | ICD-10-CM | POA: Diagnosis not present

## 2014-06-26 DIAGNOSIS — D485 Neoplasm of uncertain behavior of skin: Secondary | ICD-10-CM | POA: Diagnosis not present

## 2014-06-27 DIAGNOSIS — C9000 Multiple myeloma not having achieved remission: Secondary | ICD-10-CM | POA: Diagnosis not present

## 2014-06-27 DIAGNOSIS — J329 Chronic sinusitis, unspecified: Secondary | ICD-10-CM | POA: Diagnosis not present

## 2014-07-01 DIAGNOSIS — C9000 Multiple myeloma not having achieved remission: Secondary | ICD-10-CM | POA: Diagnosis not present

## 2014-07-01 DIAGNOSIS — R112 Nausea with vomiting, unspecified: Secondary | ICD-10-CM | POA: Diagnosis not present

## 2014-07-01 DIAGNOSIS — E860 Dehydration: Secondary | ICD-10-CM | POA: Diagnosis not present

## 2014-07-01 DIAGNOSIS — Z5111 Encounter for antineoplastic chemotherapy: Secondary | ICD-10-CM | POA: Diagnosis not present

## 2014-07-04 ENCOUNTER — Ambulatory Visit (INDEPENDENT_AMBULATORY_CARE_PROVIDER_SITE_OTHER): Payer: Medicare Other | Admitting: Critical Care Medicine

## 2014-07-04 ENCOUNTER — Encounter: Payer: Self-pay | Admitting: Critical Care Medicine

## 2014-07-04 VITALS — BP 121/75 | HR 78 | Temp 97.8°F | Ht 60.75 in | Wt 139.0 lb

## 2014-07-04 DIAGNOSIS — J019 Acute sinusitis, unspecified: Secondary | ICD-10-CM

## 2014-07-04 DIAGNOSIS — F419 Anxiety disorder, unspecified: Secondary | ICD-10-CM | POA: Insufficient documentation

## 2014-07-04 DIAGNOSIS — C73 Malignant neoplasm of thyroid gland: Secondary | ICD-10-CM | POA: Insufficient documentation

## 2014-07-04 DIAGNOSIS — J4540 Moderate persistent asthma, uncomplicated: Secondary | ICD-10-CM | POA: Diagnosis not present

## 2014-07-04 MED ORDER — FLUTICASONE PROPIONATE 50 MCG/ACT NA SUSP: 2.0000 | Freq: Two times a day (BID) | NASAL | Status: DC

## 2014-07-04 MED ORDER — AZELASTINE HCL 0.1 % NA SOLN: NASAL | Status: DC

## 2014-07-04 MED ORDER — FEXOFENADINE HCL 180 MG PO TABS: ORAL_TABLET | ORAL | Status: DC

## 2014-07-04 MED ORDER — AZITHROMYCIN 250 MG PO TABS: ORAL_TABLET | ORAL | Status: DC

## 2014-07-04 NOTE — Patient Instructions (Signed)
HOLD Allegra and Astelin Increase flonase two puff each nostril twice daily Azithromycin 250mg  Take two once then one daily until gone Finish Augmentin Use neil med rinse twice a day for 7days Return 1 month

## 2014-07-04 NOTE — Assessment & Plan Note (Signed)
Moderate persistent asthma with acute sinusitis and flare Plan HOLD Allegra and Astelin Increase flonase two puff each nostril twice daily Azithromycin 250mg  Take two once then one daily until gone Finish Augmentin Use neil med rinse twice a day for 7days Return 1 month

## 2014-07-04 NOTE — Progress Notes (Signed)
Subjective:    Patient ID: [name], female    DOB: 1940/11/28, 73 y.o.   MRN: [id number]  HPI   73 y.o. WF  never smoker with  H/o asthma/ allergies to dust mold  and referred by Dr Rex Kras 10/21/2011 for pulmonary evaluation for difficult to control asthma   07/04/2014 Chief Complaint  Patient presents with  . Acute Visit    Pt c/o having PND, sinus pressure and congestion, hoarseness, dry cough, increased wheezing and SOB x 1 week.   Pt saw PCP rx augmentin/ fluticasone nasal, astelin,  ABX started on 9/24 x 10days.   Symptoms were :  One week of pndrip, then sinus pressure, hoarse, congestion.  Wheezing worse. Pt with a lot of stress.  Noted some wheezes. Still with sinus.  Nasal congestion.  Using neil med sinus rinse.     Now on Chemorx for myeloma [facility].   Review of Systems  Constitutional:   No  weight loss, night sweats,  Fevers, chills, fatigue, lassitude. HEENT:   No headaches,  Difficulty swallowing,  Tooth/dental problems,  - Sore throat,                No sneezing, itching, ear ache,- nasal congestion, -post nasal drip,   CV:  No chest pain,  Orthopnea, PND, swelling in lower extremities, anasarca, dizziness, palpitations  GI  No heartburn, indigestion, abdominal pain, nausea, vomiting, diarrhea, change in bowel habits, loss of appetite  Resp: Notes shortness of breath with exertion not at rest.  No wheezing.  No chest wall deformity  Skin: no rash or lesions.  GU: no dysuria, change in color of urine, no urgency or frequency.  No flank pain.  MS:  No joint pain or swelling.  No decreased range of motion.  No back pain.  Psych:  No change in mood or affect. No depression or anxiety.  No memory loss.     Objective:   Physical Exam  Filed Vitals:   07/04/14 0932  BP: 121/75  Pulse: 78  Temp: 97.8 F (36.6 C)  TempSrc: Oral  Height: 5' 0.75" (1.543 m)  Weight: 139 lb (63.05 kg)  SpO2: 96%    Gen: Pleasant, well-nourished, in no distress,   normal affect  ENT: No lesions,  mouth clear,  oropharynx clear,bilateral nasal purulence, post nasal drip  Neck: No JVD, no TMG, no carotid bruits  Lungs: No use of accessory muscles, no dullness to percussion, distant breath sounds  Cardiovascular: RRR, heart sounds normal, no murmur or gallops, no peripheral edema  Abdomen: soft and NT, no HSM,  BS normal  Musculoskeletal: No deformities, no cyanosis or clubbing  Neuro: alert, non focal  Skin: Warm, no lesions or rashes     Assessment & Plan:   Extrinsic asthma Moderate persistent asthma with acute sinusitis and flare Plan HOLD Allegra and Astelin Increase flonase two puff each nostril twice daily Azithromycin 250mg  Take two once then one daily until gone Finish Augmentin Use neil med rinse twice a day for 7days Return 1 month     Updated Medication List Outpatient Encounter Prescriptions as of 07/04/2014  Medication Sig  . acetaminophen (TYLENOL) 325 MG tablet Take 325 mg by mouth every 6 (six) hours as needed for pain.  [name] albuterol (VENTOLIN HFA) 108 (90 BASE) MCG/ACT inhaler Inhale 2 puffs into the lungs every 6 (six) hours as needed for wheezing or shortness of breath.   . ALPRAZolam (XANAX) 0.5 MG tablet Take 0.5 mg  by mouth daily as needed for anxiety.   [name] amLODipine (NORVASC) 5 MG tablet Take 1 tablet (5 mg total) by mouth daily.  [name] amoxicillin-clavulanate (AUGMENTIN) 875-125 MG per tablet Take 1 tablet by mouth 2 (two) times daily.  [name] aspirin 81 MG chewable tablet Chew 81 mg by mouth at bedtime.   [name] azelastine (ASTELIN) 0.1 % nasal spray HOLD for ONE WEEK THEN RESUME  . clonazePAM (KLONOPIN) 0.5 MG tablet Take 0.25-0.5 mg by mouth at bedtime as needed for anxiety.   [name] dextromethorphan (DELSYM) 30 MG/5ML liquid Take 15 mg by mouth 2 (two) times daily.  . fexofenadine (ALLEGRA) 180 MG tablet HOLD for ONE WEEK then RESUME DAILY  . fluticasone (FLONASE) 50 MCG/ACT nasal spray Place 2 sprays into both nostrils 2  (two) times daily.  [name] levothyroxine (SYNTHROID, LEVOTHROID) 125 MCG tablet Take 125 mcg by mouth daily.    [name] MICARDIS 40 MG tablet Take 1 tablet (40 mg total) by mouth daily.  . mometasone (ASMANEX) 220 MCG/INH inhaler Inhale 2 puffs into the lungs daily.  [name] PATANOL 0.1 % ophthalmic solution Place 1 drop into the left eye 2 (two) times daily.  . simvastatin (ZOCOR) 20 MG tablet Take 1 tablet (20 mg total) by mouth every evening.  . [DISCONTINUED] azelastine (ASTELIN) 0.1 % nasal spray Place 2 sprays into both nostrils 2 (two) times daily. Use in each nostril as directed  . [DISCONTINUED] fexofenadine (ALLEGRA) 180 MG tablet Take 90 mg by mouth daily.  . [DISCONTINUED] fluticasone (FLONASE) 50 MCG/ACT nasal spray Place 1 spray into the nose as needed.  . [DISCONTINUED] Omega-3 Fatty Acids (FISH OIL PO) Take by mouth 2 (two) times daily.  [name] azithromycin (ZITHROMAX) 250 MG tablet Take two once then one daily until gone

## 2014-07-08 DIAGNOSIS — Z5111 Encounter for antineoplastic chemotherapy: Secondary | ICD-10-CM | POA: Diagnosis not present

## 2014-07-08 DIAGNOSIS — R112 Nausea with vomiting, unspecified: Secondary | ICD-10-CM | POA: Diagnosis not present

## 2014-07-08 DIAGNOSIS — E860 Dehydration: Secondary | ICD-10-CM | POA: Diagnosis not present

## 2014-07-08 DIAGNOSIS — C9000 Multiple myeloma not having achieved remission: Secondary | ICD-10-CM | POA: Diagnosis not present

## 2014-07-09 ENCOUNTER — Telehealth: Payer: Self-pay | Admitting: Critical Care Medicine

## 2014-07-09 NOTE — Telephone Encounter (Signed)
i asked for an AM clinic at [place] 10/13??  If so book her in

## 2014-07-09 NOTE — Telephone Encounter (Signed)
Spoke with pt-- states that she was seen by PW for sinus infection Pt completed ZPAK (Sunday 10/4) and Augmentin (Friday 10/2) States that her brother is not doing well and she wants to go see him soon before he starts losing the ability to speak and retain consciousness>> brain cancer is rapidly worsening. Pt states that she feels OK other than the fatigue from Chemo today. Pt state that she still has some sinus issues going on but is somewhat improved.  Pt wanting to go see her brother this weekend but wants to be checked out before she goes up there.  Pt supposed to be seen 08/2014, does not want to wait that long to go see her brother. Pt would like to come in tomorrow - 07/10/14 -  Pt has appt at 10am, wants to know if she can be squeezed in at 9am for a quick visit.  Please advise Dr Joya Gaskins. Thanks.

## 2014-07-09 NOTE — Telephone Encounter (Signed)
Called spoke with patient - she would like to know if PW would "look at her chart" to see if she is progressing enough to see her brother if at possible in [place].  She is doing well overall, just fatigued.  She did develop a small cough yesterday but otc has helped this.  Pt stated she would like to be seen early next week if at possible - declined ov with another provider, would like to see PW specifically.  Dr Joya Gaskins please advise, thank you.

## 2014-07-10 NOTE — Telephone Encounter (Signed)
These are minimal symptoms She can travel to [place] No OV needed

## 2014-07-10 NOTE — Telephone Encounter (Signed)
Not sure why sent back to me See earlier response

## 2014-07-10 NOTE — Telephone Encounter (Signed)
Patient returning call.

## 2014-07-10 NOTE — Telephone Encounter (Signed)
Dr. Joya Gaskins doesn't have openings this week.   If pt wants to be seen this week, he would like her to see another provider here. If pt doesn't want to see anyone else, he will be opening AM office on Oct 13 at [place].

## 2014-07-10 NOTE — Telephone Encounter (Signed)
Called spoke with pt. She is aware of PW response. She needed nothing further.

## 2014-07-10 NOTE — Telephone Encounter (Signed)
LMOMTCB x 1 

## 2014-07-10 NOTE — Telephone Encounter (Signed)
Called and spoke to pt for more details. Pt states she has finished her zpak and has felt slightly better since finishing it. Pt c/o mild cough with white thick mucus and PND. Pt denies SOB, CP/tightness, f/c/s, and swelling. Pt using flonase daily and states it helps and has also started taking allegra daily and is doing the neil med rinse daily.    PW, please advise if any recs and if pt ok to travel this weekend.    Allergies  Allergen Reactions  . Doxycycline     SEVERE HEADACHES  . Aldactone [Spironolactone]     Burning in stomach Burning in stomach  . Hydrocodone   . Levofloxacin   . Prednisone   . Pseudoephedrine   . Quinolones   . Sertraline   . Sertraline Hcl   . Sudafed [Pseudoephedrine Hcl]   . Ultram [Tramadol]     Headaches  Headaches   . Adhesive [Tape] Rash  . Latex Rash

## 2014-07-11 DIAGNOSIS — F411 Generalized anxiety disorder: Secondary | ICD-10-CM | POA: Diagnosis not present

## 2014-07-17 DIAGNOSIS — F411 Generalized anxiety disorder: Secondary | ICD-10-CM | POA: Diagnosis not present

## 2014-07-22 DIAGNOSIS — R112 Nausea with vomiting, unspecified: Secondary | ICD-10-CM | POA: Diagnosis not present

## 2014-07-22 DIAGNOSIS — E860 Dehydration: Secondary | ICD-10-CM | POA: Diagnosis not present

## 2014-07-22 DIAGNOSIS — Z5111 Encounter for antineoplastic chemotherapy: Secondary | ICD-10-CM | POA: Diagnosis not present

## 2014-07-22 DIAGNOSIS — C9000 Multiple myeloma not having achieved remission: Secondary | ICD-10-CM | POA: Diagnosis not present

## 2014-07-25 DIAGNOSIS — N6002 Solitary cyst of left breast: Secondary | ICD-10-CM | POA: Diagnosis not present

## 2014-07-25 DIAGNOSIS — R921 Mammographic calcification found on diagnostic imaging of breast: Secondary | ICD-10-CM | POA: Diagnosis not present

## 2014-07-29 DIAGNOSIS — E860 Dehydration: Secondary | ICD-10-CM | POA: Diagnosis not present

## 2014-07-29 DIAGNOSIS — Z5111 Encounter for antineoplastic chemotherapy: Secondary | ICD-10-CM | POA: Diagnosis not present

## 2014-07-29 DIAGNOSIS — R112 Nausea with vomiting, unspecified: Secondary | ICD-10-CM | POA: Diagnosis not present

## 2014-07-29 DIAGNOSIS — C9000 Multiple myeloma not having achieved remission: Secondary | ICD-10-CM | POA: Diagnosis not present

## 2014-07-30 DIAGNOSIS — F411 Generalized anxiety disorder: Secondary | ICD-10-CM | POA: Diagnosis not present

## 2014-08-05 ENCOUNTER — Ambulatory Visit: Payer: Medicare Other | Admitting: Critical Care Medicine

## 2014-08-05 DIAGNOSIS — C9000 Multiple myeloma not having achieved remission: Secondary | ICD-10-CM | POA: Diagnosis not present

## 2014-08-05 DIAGNOSIS — Z23 Encounter for immunization: Secondary | ICD-10-CM | POA: Diagnosis not present

## 2014-08-05 DIAGNOSIS — E860 Dehydration: Secondary | ICD-10-CM | POA: Diagnosis not present

## 2014-08-05 DIAGNOSIS — R112 Nausea with vomiting, unspecified: Secondary | ICD-10-CM | POA: Diagnosis not present

## 2014-08-05 DIAGNOSIS — Z5111 Encounter for antineoplastic chemotherapy: Secondary | ICD-10-CM | POA: Diagnosis not present

## 2014-08-08 DIAGNOSIS — Z85820 Personal history of malignant melanoma of skin: Secondary | ICD-10-CM | POA: Diagnosis not present

## 2014-08-08 DIAGNOSIS — E785 Hyperlipidemia, unspecified: Secondary | ICD-10-CM | POA: Diagnosis not present

## 2014-08-08 DIAGNOSIS — J329 Chronic sinusitis, unspecified: Secondary | ICD-10-CM | POA: Diagnosis not present

## 2014-08-08 DIAGNOSIS — C9000 Multiple myeloma not having achieved remission: Secondary | ICD-10-CM | POA: Diagnosis not present

## 2014-08-08 DIAGNOSIS — N189 Chronic kidney disease, unspecified: Secondary | ICD-10-CM | POA: Diagnosis not present

## 2014-08-08 DIAGNOSIS — Z79899 Other long term (current) drug therapy: Secondary | ICD-10-CM | POA: Diagnosis not present

## 2014-08-08 DIAGNOSIS — E032 Hypothyroidism due to medicaments and other exogenous substances: Secondary | ICD-10-CM | POA: Diagnosis not present

## 2014-08-08 DIAGNOSIS — R319 Hematuria, unspecified: Secondary | ICD-10-CM | POA: Diagnosis not present

## 2014-08-09 ENCOUNTER — Telehealth: Payer: Self-pay | Admitting: Cardiology

## 2014-08-09 NOTE — Telephone Encounter (Signed)
New problem   Pt stated [facility] doubled her cholesterol medication and she need to speak to nurse. Please advise

## 2014-08-09 NOTE — Telephone Encounter (Signed)
I spoke with [name] & she agrees Dr. Mare Ferrari to continue simvastatin 30mg  daily Horton Chin RN

## 2014-08-09 NOTE — Telephone Encounter (Signed)
Agree with patient.  Just leave it at 30 mg daily for now and we can check her LFTs at next OV

## 2014-08-09 NOTE — Telephone Encounter (Signed)
Pt calls today bc she is concerned about the increase in her Simvastatin from 20mg  to 40mg  by her pcp Dr. Ansel Bong. (new pcp to pt) She is still taking chemo every Monday.  She increased the Simvastatin but only to 30mg  daily.  "I was afraid it would hurt my liver"  States her cholesterol readings were :   Total cholesterol 224   Triglycerides 164 States she has not been able to exercise with "this port a cath & chemo"  She is very emotional about her recent chemo & blood draw at the cancer center in [facility]. I have tried to reassure her. She will talk with her oncologist about her recent experience.  She would like to know Dr. Sherryl Barters thoughts about the increased dose of Simvastatin. She would also like him to know that her brother has end stage brain cancer.  Forwarded to Dr. Mare Ferrari & Rip Harbour. Horton Chin RN

## 2014-08-12 ENCOUNTER — Encounter: Payer: Self-pay | Admitting: Critical Care Medicine

## 2014-08-12 ENCOUNTER — Ambulatory Visit (INDEPENDENT_AMBULATORY_CARE_PROVIDER_SITE_OTHER): Payer: Medicare Other | Admitting: Critical Care Medicine

## 2014-08-12 VITALS — BP 138/82 | HR 76 | Ht 61.0 in | Wt 142.6 lb

## 2014-08-12 DIAGNOSIS — J302 Other seasonal allergic rhinitis: Secondary | ICD-10-CM

## 2014-08-12 DIAGNOSIS — J4540 Moderate persistent asthma, uncomplicated: Secondary | ICD-10-CM

## 2014-08-12 DIAGNOSIS — J309 Allergic rhinitis, unspecified: Secondary | ICD-10-CM | POA: Insufficient documentation

## 2014-08-12 MED ORDER — AZELASTINE HCL 0.1 % NA SOLN: 2.0000 | Freq: Two times a day (BID) | NASAL | Status: DC

## 2014-08-12 MED ORDER — FLUTICASONE PROPIONATE 50 MCG/ACT NA SUSP: 2.0000 | Freq: Two times a day (BID) | NASAL | Status: DC

## 2014-08-12 NOTE — Assessment & Plan Note (Signed)
Allergic rhinitis with postnasal drip syndrome Plan Maintain Astelin nasal spray and nasal steroid along with oral antihistamine

## 2014-08-12 NOTE — Assessment & Plan Note (Signed)
Moderate persistent asthma stable at this time Plan Maintain Asmanex daily

## 2014-08-12 NOTE — Patient Instructions (Signed)
Resume fluticasone two puff daily each nostril No other changes Return 4 months

## 2014-08-12 NOTE — Progress Notes (Signed)
Subjective:    Patient ID: [name], female    DOB: [birth year], 73 y.o.   MRN: [id number]  HPI 08/12/2014 Chief Complaint  Patient presents with  . Follow-up    fatigue, chills, some cough, chest tightness, sinus pressure, watery, burning eyes   Pt notes more fatigue, cough and chest tightness.  Notes some sinus pressure, eyes burning.  Chest is tight.  Notes some dyspnea. Notes some wheeze. Using nasal rinse.     Review of Systems Constitutional:   No  weight loss, night sweats,  Fevers, chills, fatigue, lassitude. HEENT:   No headaches,  Difficulty swallowing,  Tooth/dental problems,  Sore throat,                No sneezing, itching, ear ache, +++  nasal congestion, +++ post nasal drip,   CV:  No chest pain,  Orthopnea, PND, swelling in lower extremities, anasarca, dizziness, palpitations  GI  No heartburn, indigestion, abdominal pain, nausea, vomiting, diarrhea, change in bowel habits, loss of appetite  Resp: Notes shortness of breath with exertion or at rest.  No excess mucus, no productive cough,  No non-productive cough,  No coughing up of blood.  No change in color of mucus.  No wheezing.  No chest wall deformity  Skin: no rash or lesions.  GU: no dysuria, change in color of urine, no urgency or frequency.  No flank pain.  MS:  No joint pain or swelling.  No decreased range of motion.  No back pain.  Psych:  No change in mood or affect. No depression or anxiety.  No memory loss.     Objective:   Physical Exam Filed Vitals:   08/12/14 0904  BP: 138/82  Pulse: 76  Height: 5\' 1"  (1.549 m)  Weight: 142 lb 9.6 oz (64.683 kg)  SpO2: 95%    Gen: Pleasant, well-nourished, in no distress,  normal affect  ENT: No lesions,  mouth clear,  oropharynx clear, +++ postnasal drip  Neck: No JVD, no TMG, no carotid bruits  Lungs: No use of accessory muscles, no dullness to percussion, clear without rales or rhonchi  Cardiovascular: RRR, heart sounds normal, no murmur  or gallops, no peripheral edema  Abdomen: soft and NT, no HSM,  BS normal  Musculoskeletal: No deformities, no cyanosis or clubbing  Neuro: alert, non focal  Skin: Warm, no lesions or rashes  No results found.        Assessment & Plan:   Extrinsic asthma Moderate persistent asthma stable at this time Plan Maintain Asmanex daily  Allergic rhinitis Allergic rhinitis with postnasal drip syndrome Plan Maintain Astelin nasal spray and nasal steroid along with oral antihistamine   Updated Medication List Outpatient Encounter Prescriptions as of 08/12/2014  Medication Sig  . acetaminophen (TYLENOL) 325 MG tablet Take 325 mg by mouth every 6 (six) hours as needed for pain.  [name] albuterol (VENTOLIN HFA) 108 (90 BASE) MCG/ACT inhaler Inhale 2 puffs into the lungs every 6 (six) hours as needed for wheezing or shortness of breath.   . ALPRAZolam (XANAX) 0.5 MG tablet Take 0.5 mg by mouth daily as needed for anxiety.   [name] amLODipine (NORVASC) 5 MG tablet Take 1 tablet (5 mg total) by mouth daily.  [name] aspirin 81 MG chewable tablet Chew 81 mg by mouth at bedtime.   [name] azelastine (ASTELIN) 0.1 % nasal spray Place 2 sprays into both nostrils 2 (two) times daily.  . clonazePAM (KLONOPIN) 0.5 MG tablet Take 0.25-0.5 mg  by mouth at bedtime as needed for anxiety.   [name] dextromethorphan (DELSYM) 30 MG/5ML liquid Take 15 mg by mouth 2 (two) times daily.  . fexofenadine (ALLEGRA) 180 MG tablet HOLD for ONE WEEK then RESUME DAILY  . fluticasone (FLONASE) 50 MCG/ACT nasal spray Place 2 sprays into both nostrils 2 (two) times daily.  [name] levothyroxine (SYNTHROID, LEVOTHROID) 125 MCG tablet Take 125 mcg by mouth daily.    [name] MICARDIS 40 MG tablet Take 1 tablet (40 mg total) by mouth daily.  . mometasone (ASMANEX) 220 MCG/INH inhaler Inhale 2 puffs into the lungs daily.  [name] PATANOL 0.1 % ophthalmic solution Place 1 drop into the left eye 2 (two) times daily.  . simvastatin (ZOCOR) 20 MG tablet Take 1 tablet (20  mg total) by mouth every evening.  . [DISCONTINUED] azelastine (ASTELIN) 0.1 % nasal spray HOLD for ONE WEEK THEN RESUME  . [DISCONTINUED] fluticasone (FLONASE) 50 MCG/ACT nasal spray Place 2 sprays into both nostrils 2 (two) times daily.  . [DISCONTINUED] amoxicillin-clavulanate (AUGMENTIN) 875-125 MG per tablet Take 1 tablet by mouth 2 (two) times daily.  . [DISCONTINUED] azithromycin (ZITHROMAX) 250 MG tablet Take two once then one daily until gone

## 2014-08-14 ENCOUNTER — Telehealth: Payer: Self-pay | Admitting: *Deleted

## 2014-08-14 NOTE — Telephone Encounter (Signed)
Spoke with patient regarding cholesterol medication.  Saw her PCP and he wanted to start her on Lipitor and she stated she could not tolerate Patient increased her Simvastatin to 30 mg on her on Advised patient that maximum recommendations of Simvastatin with Amlodipine is 20 mg.  Patient stated will decrease Simvastatin back to 20 mg daily She has contacted PCP regarding recommendations.

## 2014-08-15 DIAGNOSIS — F411 Generalized anxiety disorder: Secondary | ICD-10-CM | POA: Diagnosis not present

## 2014-08-19 DIAGNOSIS — E860 Dehydration: Secondary | ICD-10-CM | POA: Diagnosis not present

## 2014-08-19 DIAGNOSIS — Z5111 Encounter for antineoplastic chemotherapy: Secondary | ICD-10-CM | POA: Diagnosis not present

## 2014-08-19 DIAGNOSIS — R112 Nausea with vomiting, unspecified: Secondary | ICD-10-CM | POA: Diagnosis not present

## 2014-08-19 DIAGNOSIS — C9000 Multiple myeloma not having achieved remission: Secondary | ICD-10-CM | POA: Diagnosis not present

## 2014-08-20 DIAGNOSIS — F411 Generalized anxiety disorder: Secondary | ICD-10-CM | POA: Diagnosis not present

## 2014-08-22 DIAGNOSIS — Z5111 Encounter for antineoplastic chemotherapy: Secondary | ICD-10-CM | POA: Diagnosis not present

## 2014-08-22 DIAGNOSIS — I10 Essential (primary) hypertension: Secondary | ICD-10-CM | POA: Diagnosis not present

## 2014-08-22 DIAGNOSIS — C9000 Multiple myeloma not having achieved remission: Secondary | ICD-10-CM | POA: Diagnosis not present

## 2014-08-22 DIAGNOSIS — R42 Dizziness and giddiness: Secondary | ICD-10-CM | POA: Diagnosis not present

## 2014-08-26 DIAGNOSIS — Z5111 Encounter for antineoplastic chemotherapy: Secondary | ICD-10-CM | POA: Diagnosis not present

## 2014-08-26 DIAGNOSIS — R112 Nausea with vomiting, unspecified: Secondary | ICD-10-CM | POA: Diagnosis not present

## 2014-08-26 DIAGNOSIS — E860 Dehydration: Secondary | ICD-10-CM | POA: Diagnosis not present

## 2014-08-26 DIAGNOSIS — C9000 Multiple myeloma not having achieved remission: Secondary | ICD-10-CM | POA: Diagnosis not present

## 2014-09-02 DIAGNOSIS — Z23 Encounter for immunization: Secondary | ICD-10-CM | POA: Diagnosis not present

## 2014-09-02 DIAGNOSIS — C9000 Multiple myeloma not having achieved remission: Secondary | ICD-10-CM | POA: Diagnosis not present

## 2014-09-02 DIAGNOSIS — E860 Dehydration: Secondary | ICD-10-CM | POA: Diagnosis not present

## 2014-09-02 DIAGNOSIS — R112 Nausea with vomiting, unspecified: Secondary | ICD-10-CM | POA: Diagnosis not present

## 2014-09-02 DIAGNOSIS — Z5111 Encounter for antineoplastic chemotherapy: Secondary | ICD-10-CM | POA: Diagnosis not present

## 2014-09-03 DIAGNOSIS — F411 Generalized anxiety disorder: Secondary | ICD-10-CM | POA: Diagnosis not present

## 2014-09-09 DIAGNOSIS — F411 Generalized anxiety disorder: Secondary | ICD-10-CM | POA: Diagnosis not present

## 2014-09-16 DIAGNOSIS — E860 Dehydration: Secondary | ICD-10-CM | POA: Diagnosis not present

## 2014-09-16 DIAGNOSIS — M25572 Pain in left ankle and joints of left foot: Secondary | ICD-10-CM | POA: Diagnosis not present

## 2014-09-16 DIAGNOSIS — Z5111 Encounter for antineoplastic chemotherapy: Secondary | ICD-10-CM | POA: Diagnosis not present

## 2014-09-16 DIAGNOSIS — M25472 Effusion, left ankle: Secondary | ICD-10-CM | POA: Diagnosis not present

## 2014-09-16 DIAGNOSIS — C9000 Multiple myeloma not having achieved remission: Secondary | ICD-10-CM | POA: Diagnosis not present

## 2014-09-16 DIAGNOSIS — R112 Nausea with vomiting, unspecified: Secondary | ICD-10-CM | POA: Diagnosis not present

## 2014-09-16 DIAGNOSIS — M79672 Pain in left foot: Secondary | ICD-10-CM | POA: Diagnosis not present

## 2014-09-20 DIAGNOSIS — M722 Plantar fascial fibromatosis: Secondary | ICD-10-CM | POA: Diagnosis not present

## 2014-09-20 DIAGNOSIS — S93409A Sprain of unspecified ligament of unspecified ankle, initial encounter: Secondary | ICD-10-CM | POA: Diagnosis not present

## 2014-09-20 DIAGNOSIS — J329 Chronic sinusitis, unspecified: Secondary | ICD-10-CM | POA: Diagnosis not present

## 2014-09-23 DIAGNOSIS — Z5111 Encounter for antineoplastic chemotherapy: Secondary | ICD-10-CM | POA: Diagnosis not present

## 2014-09-23 DIAGNOSIS — C9000 Multiple myeloma not having achieved remission: Secondary | ICD-10-CM | POA: Diagnosis not present

## 2014-09-23 DIAGNOSIS — R112 Nausea with vomiting, unspecified: Secondary | ICD-10-CM | POA: Diagnosis not present

## 2014-09-23 DIAGNOSIS — E860 Dehydration: Secondary | ICD-10-CM | POA: Diagnosis not present

## 2014-09-24 DIAGNOSIS — F411 Generalized anxiety disorder: Secondary | ICD-10-CM | POA: Diagnosis not present

## 2014-09-30 DIAGNOSIS — E860 Dehydration: Secondary | ICD-10-CM | POA: Diagnosis not present

## 2014-09-30 DIAGNOSIS — Z5111 Encounter for antineoplastic chemotherapy: Secondary | ICD-10-CM | POA: Diagnosis not present

## 2014-09-30 DIAGNOSIS — C9000 Multiple myeloma not having achieved remission: Secondary | ICD-10-CM | POA: Diagnosis not present

## 2014-09-30 DIAGNOSIS — R112 Nausea with vomiting, unspecified: Secondary | ICD-10-CM | POA: Diagnosis not present

## 2014-10-01 DIAGNOSIS — Z9889 Other specified postprocedural states: Secondary | ICD-10-CM | POA: Diagnosis not present

## 2014-10-01 DIAGNOSIS — T66XXXD Radiation sickness, unspecified, subsequent encounter: Secondary | ICD-10-CM | POA: Diagnosis not present

## 2014-10-01 DIAGNOSIS — H3531 Nonexudative age-related macular degeneration: Secondary | ICD-10-CM | POA: Diagnosis not present

## 2014-10-01 DIAGNOSIS — C6931 Malignant neoplasm of right choroid: Secondary | ICD-10-CM | POA: Diagnosis not present

## 2014-10-01 DIAGNOSIS — Z961 Presence of intraocular lens: Secondary | ICD-10-CM | POA: Diagnosis not present

## 2014-10-11 DIAGNOSIS — I129 Hypertensive chronic kidney disease with stage 1 through stage 4 chronic kidney disease, or unspecified chronic kidney disease: Secondary | ICD-10-CM | POA: Diagnosis not present

## 2014-10-11 DIAGNOSIS — R319 Hematuria, unspecified: Secondary | ICD-10-CM | POA: Diagnosis not present

## 2014-10-11 DIAGNOSIS — N182 Chronic kidney disease, stage 2 (mild): Secondary | ICD-10-CM | POA: Diagnosis not present

## 2014-10-11 DIAGNOSIS — C6990 Malignant neoplasm of unspecified site of unspecified eye: Secondary | ICD-10-CM | POA: Diagnosis not present

## 2014-10-11 DIAGNOSIS — F411 Generalized anxiety disorder: Secondary | ICD-10-CM | POA: Diagnosis not present

## 2014-10-15 DIAGNOSIS — Z5111 Encounter for antineoplastic chemotherapy: Secondary | ICD-10-CM | POA: Diagnosis not present

## 2014-10-15 DIAGNOSIS — C9000 Multiple myeloma not having achieved remission: Secondary | ICD-10-CM | POA: Diagnosis not present

## 2014-10-15 DIAGNOSIS — E860 Dehydration: Secondary | ICD-10-CM | POA: Diagnosis not present

## 2014-10-15 DIAGNOSIS — R112 Nausea with vomiting, unspecified: Secondary | ICD-10-CM | POA: Diagnosis not present

## 2014-10-16 DIAGNOSIS — Z79899 Other long term (current) drug therapy: Secondary | ICD-10-CM | POA: Diagnosis not present

## 2014-10-16 DIAGNOSIS — C9000 Multiple myeloma not having achieved remission: Secondary | ICD-10-CM | POA: Diagnosis not present

## 2014-10-16 DIAGNOSIS — Z6826 Body mass index (BMI) 26.0-26.9, adult: Secondary | ICD-10-CM | POA: Diagnosis not present

## 2014-10-21 DIAGNOSIS — R112 Nausea with vomiting, unspecified: Secondary | ICD-10-CM | POA: Diagnosis not present

## 2014-10-21 DIAGNOSIS — E860 Dehydration: Secondary | ICD-10-CM | POA: Diagnosis not present

## 2014-10-21 DIAGNOSIS — C9000 Multiple myeloma not having achieved remission: Secondary | ICD-10-CM | POA: Diagnosis not present

## 2014-10-21 DIAGNOSIS — Z5111 Encounter for antineoplastic chemotherapy: Secondary | ICD-10-CM | POA: Diagnosis not present

## 2014-10-23 ENCOUNTER — Other Ambulatory Visit: Payer: Self-pay | Admitting: Dermatology

## 2014-10-23 DIAGNOSIS — D1801 Hemangioma of skin and subcutaneous tissue: Secondary | ICD-10-CM | POA: Diagnosis not present

## 2014-10-23 DIAGNOSIS — D2261 Melanocytic nevi of right upper limb, including shoulder: Secondary | ICD-10-CM | POA: Diagnosis not present

## 2014-10-23 DIAGNOSIS — Z85828 Personal history of other malignant neoplasm of skin: Secondary | ICD-10-CM | POA: Diagnosis not present

## 2014-10-23 DIAGNOSIS — L821 Other seborrheic keratosis: Secondary | ICD-10-CM | POA: Diagnosis not present

## 2014-10-23 DIAGNOSIS — D2272 Melanocytic nevi of left lower limb, including hip: Secondary | ICD-10-CM | POA: Diagnosis not present

## 2014-10-23 DIAGNOSIS — L218 Other seborrheic dermatitis: Secondary | ICD-10-CM | POA: Diagnosis not present

## 2014-10-23 DIAGNOSIS — D225 Melanocytic nevi of trunk: Secondary | ICD-10-CM | POA: Diagnosis not present

## 2014-10-23 DIAGNOSIS — D485 Neoplasm of uncertain behavior of skin: Secondary | ICD-10-CM | POA: Diagnosis not present

## 2014-10-28 DIAGNOSIS — C9000 Multiple myeloma not having achieved remission: Secondary | ICD-10-CM | POA: Diagnosis not present

## 2014-10-28 DIAGNOSIS — R112 Nausea with vomiting, unspecified: Secondary | ICD-10-CM | POA: Diagnosis not present

## 2014-10-28 DIAGNOSIS — E860 Dehydration: Secondary | ICD-10-CM | POA: Diagnosis not present

## 2014-10-28 DIAGNOSIS — Z5111 Encounter for antineoplastic chemotherapy: Secondary | ICD-10-CM | POA: Diagnosis not present

## 2014-10-30 ENCOUNTER — Ambulatory Visit (INDEPENDENT_AMBULATORY_CARE_PROVIDER_SITE_OTHER): Payer: Medicare Other | Admitting: Cardiology

## 2014-10-30 ENCOUNTER — Other Ambulatory Visit (INDEPENDENT_AMBULATORY_CARE_PROVIDER_SITE_OTHER): Payer: Medicare Other | Admitting: *Deleted

## 2014-10-30 ENCOUNTER — Encounter: Payer: Self-pay | Admitting: Cardiology

## 2014-10-30 VITALS — BP 140/70 | HR 78 | Ht 61.0 in | Wt 140.1 lb

## 2014-10-30 DIAGNOSIS — E7800 Pure hypercholesterolemia, unspecified: Secondary | ICD-10-CM

## 2014-10-30 DIAGNOSIS — I119 Hypertensive heart disease without heart failure: Secondary | ICD-10-CM

## 2014-10-30 DIAGNOSIS — I129 Hypertensive chronic kidney disease with stage 1 through stage 4 chronic kidney disease, or unspecified chronic kidney disease: Secondary | ICD-10-CM | POA: Diagnosis not present

## 2014-10-30 LAB — LIPID PANEL
CHOLESTEROL: 158 mg/dL (ref 0–200)
HDL: 48 mg/dL (ref >39.00)
LDL Cholesterol: 82 mg/dL (ref 0–99)
NonHDL: 110
Total CHOL/HDL Ratio: 3
Triglycerides: 138 mg/dL (ref 0.0–149.0)
VLDL: 27.6 mg/dL (ref 0.0–40.0)

## 2014-10-30 LAB — HEPATIC FUNCTION PANEL
ALBUMIN: 4 g/dL (ref 3.5–5.2)
ALK PHOS: 33 U/L — AB (ref 39–117)
ALT: 10 U/L (ref 0–35)
AST: 15 U/L (ref 0–37)
Bilirubin, Direct: 0.1 mg/dL (ref 0.0–0.3)
Total Bilirubin: 0.9 mg/dL (ref 0.2–1.2)
Total Protein: 7.2 g/dL (ref 6.0–8.3)

## 2014-10-30 LAB — BASIC METABOLIC PANEL
BUN: 14 mg/dL (ref 6–23)
CALCIUM: 9.3 mg/dL (ref 8.4–10.5)
CO2: 30 mEq/L (ref 19–32)
CREATININE: 0.72 mg/dL (ref 0.40–1.20)
Chloride: 106 mEq/L (ref 96–112)
GFR: 83.83 mL/min (ref >60.00)
GLUCOSE: 95 mg/dL (ref 70–99)
POTASSIUM: 3.9 meq/L (ref 3.5–5.1)
Sodium: 141 mEq/L (ref 135–145)

## 2014-10-30 NOTE — Progress Notes (Signed)
Cardiology Office Note   Date:  10/30/2014   ID:  [name], DOB [birth year], MRN [id number]  PCP:  Gennette Pac, MD  Cardiologist:   Darlin Coco, MD   No chief complaint on file.     History of Present Illness: [name] is a 74 y.o. female who presents for scheduled follow-up office visit.  This pleasant 74 year old woman is seen for followup office visit. She has a past history of essential hypertension. She also has a past history of hypercholesterolemia and a past history of melanoma as well as thyroid cancer. She has had previous atypical chest pain. She had a normal nuclear stress test in 2006.  She is being treated for multiple myeloma at [facility]. She initially was treated with oral Revlimid but after 3 weeks did not tolerate it because of side effects. She is now receiving intravenous chemotherapy intermittently through a Port-A-Cath in her right chest. She has been treated with steroids, off at present.  Dr. Philipp Ovens from [facility] is directing her chemotherapy.  She has been receiving her chemotherapy at [facility].   Past Medical History  Diagnosis Date  . 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 History of blood clots     LEG  . Stroke   . Hernia   . MGUS (monoclonal gammopathy of unknown significance)   . Chronic kidney disease   . Hypertension   . Hyperlipidemia   . Thyroid disease   . Arthritis   . Mitral valve prolapse   . Anxiety   . Perforated bowel   . Peritonitis   . Diverticulosis     Pt reported on 03/15/12  . MGUS (monoclonal gammopathy of unknown significance)   . Cancer     THYROID, SKIN, NOSE, BONE  . Melanoma   . Melanoma 1989    chest    Past Surgical History  Procedure Laterality Date  . Abdominal hysterectomy    . Rotator cuff repair  2004    RIGHT SHOULDER  . Hernia repair    . Leg surgery      VEIN & BLOOD CLOT  . Cardiovascular stress test  2006    NORMAL  . Transthoracic  echocardiogram  2006  . Melanoma excision      R eye  . Skin cancer excision  2011, 2013    squamous cell of nose x 2  . Breast lumpectomy  12/2010    R breast  . Breast lumpectomy  2004    L axilla neg for cancer.     Current Outpatient Prescriptions  Medication Sig Dispense Refill  . acetaminophen (TYLENOL) 325 MG tablet Take 325 mg by mouth.    [name] albuterol (VENTOLIN HFA) 108 (90 BASE) MCG/ACT inhaler Inhale 2 puffs into the lungs every 6 (six) hours as needed for wheezing or shortness of breath.     . ALPRAZolam (XANAX) 0.5 MG tablet Take 0.5 mg by mouth daily as needed for anxiety.     [name] amLODipine (NORVASC) 5 MG tablet Take 1 tablet (5 mg total) by mouth daily. 90 tablet 3  . aspirin 81 MG chewable tablet Chew 81 mg by mouth at bedtime.     [name] azelastine (ASTELIN) 0.1 % nasal spray Place 2 sprays into both nostrils 2 (two) times daily. 30 mL   . clonazePAM (KLONOPIN) 0.5 MG tablet Take 0.25-0.5 mg by mouth at bedtime as needed for anxiety.     [name]  dexamethasone (DECADRON) 4 MG tablet     . dextromethorphan (DELSYM) 30 MG/5ML liquid Take 60 mg by mouth.    . fexofenadine (ALLEGRA) 180 MG tablet HOLD for ONE WEEK then RESUME DAILY    . fluticasone (FLONASE) 50 MCG/ACT nasal spray Place 2 sprays into both nostrils 2 (two) times daily. 16 g 6  . levothyroxine (SYNTHROID, LEVOTHROID) 125 MCG tablet Take 125 mcg by mouth daily.      [name] MICARDIS 40 MG tablet Take 1 tablet (40 mg total) by mouth daily. 90 tablet 3  . mometasone (ASMANEX) 220 MCG/INH inhaler Inhale 2 puffs into the lungs daily.    [name] PATANOL 0.1 % ophthalmic solution Place 1 drop into the left eye 2 (two) times daily.    . simvastatin (ZOCOR) 20 MG tablet Take 1 tablet (20 mg total) by mouth every evening. 90 tablet 3   No current facility-administered medications for this visit.    Allergies:   Doxycycline; Lipitor; Aldactone; Hydrocodone; Levofloxacin; Prednisone; Pseudoephedrine; Quinolones; Sertraline; Sertraline hcl;  Sudafed; Ultram; Adhesive; and Latex    Social History:  The patient  reports that she has never smoked. She has never used smokeless tobacco. She reports that she does not drink alcohol or use illicit drugs.   Family History:  The patient's family history includes Asthma in her father; Cancer in her brother, father, father, sister, and sister; Cervical cancer in her sister; Diabetes in her father and sister; Heart failure in her father; Stroke in her mother; Stroke (age of onset: 53) in her mother. Brother died brain cancer in 10-Aug-2014   ROS:  Please see the history of present illness.   Otherwise, review of systems are positive for multiple myeloma lesions in bones.  Pain in left ankle.  Questionable torn ligament.   All other systems are reviewed and negative.    PHYSICAL EXAM: VS:  BP 140/70 mmHg  Pulse 78  Ht _0  (1.549 m)  Wt 140 lb 1.9 oz (63.558 kg)  BMI 26.49 kg/m2  SpO2 98% , BMI Body mass index is 26.49 kg/(m^2). GEN: Well nourished, well developed, in no acute distress HEENT: normal Neck: no JVD, carotid bruits, or masses Cardiac: RRR; no murmurs, rubs, or gallops,no edema  Respiratory:  clear to auscultation bilaterally, normal work of breathing GI: soft, nontender, nondistended, + BS MS: no deformity or atrophy Skin: warm and dry, no rash Neuro:  Strength and sensation are intact Psych: euthymic mood, full affect   EKG:  EKG is not ordered today.   Recent Labs: 06/18/2014: ALT 14; BUN 16; Creatinine 0.7; Potassium 3.7; Sodium 142    Lipid Panel    Component Value Date/Time   CHOL 199 06/18/2014 1036   TRIG 120.0 06/18/2014 1036   HDL 52.90 06/18/2014 1036   CHOLHDL 4 06/18/2014 1036   VLDL 24.0 06/18/2014 1036   LDLCALC 122* 06/18/2014 1036      Wt Readings from Last 3 Encounters:  10/30/14 140 lb 1.9 oz (63.558 kg)  08/12/14 142 lb 9.6 oz (64.683 kg)  07/04/14 139 lb (63.05 kg)      Other studies Reviewed: Additional studies/ records that  were reviewed today include:   None     ASSESSMENT AND PLAN:  1. essential hypertension without heart failure 2. Hypercholesterolemia 3. multiple myeloma 4. history of malignant melanoma 5. history of thyroid cancer   Current medicines are reviewed at length with the patient today.  The patient does not have concerns regarding medicines.  The following changes have been made:  no change  Labs/ tests ordered today include:   Orders Placed This Encounter  Procedures  . Lipid panel  . Hepatic function panel  . Basic metabolic panel     Disposition:   FU with  in 4 months. OV LP HFP BMET   Signed, Darlin Coco, MD  10/30/2014 11:44 AM    [facility] [address], [place], [place]  [address] Phone: [phone]; Fax: [phone]

## 2014-10-30 NOTE — Patient Instructions (Signed)
Your physician recommends that you continue on your current medications as directed. Please refer to the Current Medication list given to you today.  Your physician recommends that you schedule a follow-up appointment in: 4 months with fasting labs (lp/bmet/hfp)  

## 2014-10-31 DIAGNOSIS — F411 Generalized anxiety disorder: Secondary | ICD-10-CM | POA: Diagnosis not present

## 2014-10-31 NOTE — Progress Notes (Signed)
Quick Note:  Please report to patient. The recent labs are stable. Continue same medication and careful diet. ______ 

## 2014-11-06 ENCOUNTER — Other Ambulatory Visit: Payer: Self-pay | Admitting: Dermatology

## 2014-11-06 DIAGNOSIS — D485 Neoplasm of uncertain behavior of skin: Secondary | ICD-10-CM | POA: Diagnosis not present

## 2014-11-06 DIAGNOSIS — D225 Melanocytic nevi of trunk: Secondary | ICD-10-CM | POA: Diagnosis not present

## 2014-11-06 DIAGNOSIS — Z85828 Personal history of other malignant neoplasm of skin: Secondary | ICD-10-CM | POA: Diagnosis not present

## 2014-11-11 DIAGNOSIS — C9000 Multiple myeloma not having achieved remission: Secondary | ICD-10-CM | POA: Diagnosis not present

## 2014-11-11 DIAGNOSIS — Z7952 Long term (current) use of systemic steroids: Secondary | ICD-10-CM | POA: Diagnosis not present

## 2014-11-11 DIAGNOSIS — R112 Nausea with vomiting, unspecified: Secondary | ICD-10-CM | POA: Diagnosis not present

## 2014-11-11 DIAGNOSIS — Z5111 Encounter for antineoplastic chemotherapy: Secondary | ICD-10-CM | POA: Diagnosis not present

## 2014-11-11 DIAGNOSIS — E860 Dehydration: Secondary | ICD-10-CM | POA: Diagnosis not present

## 2014-11-14 DIAGNOSIS — F411 Generalized anxiety disorder: Secondary | ICD-10-CM | POA: Diagnosis not present

## 2014-11-18 DIAGNOSIS — E860 Dehydration: Secondary | ICD-10-CM | POA: Diagnosis not present

## 2014-11-18 DIAGNOSIS — Z5111 Encounter for antineoplastic chemotherapy: Secondary | ICD-10-CM | POA: Diagnosis not present

## 2014-11-18 DIAGNOSIS — C9000 Multiple myeloma not having achieved remission: Secondary | ICD-10-CM | POA: Diagnosis not present

## 2014-11-18 DIAGNOSIS — R112 Nausea with vomiting, unspecified: Secondary | ICD-10-CM | POA: Diagnosis not present

## 2014-11-25 DIAGNOSIS — C9000 Multiple myeloma not having achieved remission: Secondary | ICD-10-CM | POA: Diagnosis not present

## 2014-11-25 DIAGNOSIS — E860 Dehydration: Secondary | ICD-10-CM | POA: Diagnosis not present

## 2014-11-25 DIAGNOSIS — R112 Nausea with vomiting, unspecified: Secondary | ICD-10-CM | POA: Diagnosis not present

## 2014-11-25 DIAGNOSIS — Z5111 Encounter for antineoplastic chemotherapy: Secondary | ICD-10-CM | POA: Diagnosis not present

## 2014-11-27 DIAGNOSIS — M7752 Other enthesopathy of left foot: Secondary | ICD-10-CM | POA: Diagnosis not present

## 2014-11-27 DIAGNOSIS — M25572 Pain in left ankle and joints of left foot: Secondary | ICD-10-CM | POA: Diagnosis not present

## 2014-11-28 DIAGNOSIS — F411 Generalized anxiety disorder: Secondary | ICD-10-CM | POA: Diagnosis not present

## 2014-12-02 DIAGNOSIS — M25572 Pain in left ankle and joints of left foot: Secondary | ICD-10-CM | POA: Diagnosis not present

## 2014-12-02 DIAGNOSIS — J329 Chronic sinusitis, unspecified: Secondary | ICD-10-CM | POA: Diagnosis not present

## 2014-12-05 DIAGNOSIS — M25572 Pain in left ankle and joints of left foot: Secondary | ICD-10-CM | POA: Diagnosis not present

## 2014-12-09 DIAGNOSIS — R112 Nausea with vomiting, unspecified: Secondary | ICD-10-CM | POA: Diagnosis not present

## 2014-12-09 DIAGNOSIS — C9000 Multiple myeloma not having achieved remission: Secondary | ICD-10-CM | POA: Diagnosis not present

## 2014-12-09 DIAGNOSIS — Z5111 Encounter for antineoplastic chemotherapy: Secondary | ICD-10-CM | POA: Diagnosis not present

## 2014-12-09 DIAGNOSIS — E860 Dehydration: Secondary | ICD-10-CM | POA: Diagnosis not present

## 2014-12-10 DIAGNOSIS — N182 Chronic kidney disease, stage 2 (mild): Secondary | ICD-10-CM | POA: Diagnosis not present

## 2014-12-10 DIAGNOSIS — M25572 Pain in left ankle and joints of left foot: Secondary | ICD-10-CM | POA: Diagnosis not present

## 2014-12-12 DIAGNOSIS — M25572 Pain in left ankle and joints of left foot: Secondary | ICD-10-CM | POA: Diagnosis not present

## 2014-12-16 DIAGNOSIS — C9000 Multiple myeloma not having achieved remission: Secondary | ICD-10-CM | POA: Diagnosis not present

## 2014-12-16 DIAGNOSIS — E860 Dehydration: Secondary | ICD-10-CM | POA: Diagnosis not present

## 2014-12-16 DIAGNOSIS — Z5111 Encounter for antineoplastic chemotherapy: Secondary | ICD-10-CM | POA: Diagnosis not present

## 2014-12-16 DIAGNOSIS — R112 Nausea with vomiting, unspecified: Secondary | ICD-10-CM | POA: Diagnosis not present

## 2014-12-17 DIAGNOSIS — F411 Generalized anxiety disorder: Secondary | ICD-10-CM | POA: Diagnosis not present

## 2014-12-17 DIAGNOSIS — M25572 Pain in left ankle and joints of left foot: Secondary | ICD-10-CM | POA: Diagnosis not present

## 2014-12-18 ENCOUNTER — Telehealth: Payer: Self-pay | Admitting: Critical Care Medicine

## 2014-12-18 MED ORDER — CEFDINIR 300 MG PO CAPS: 600.0000 mg | ORAL_CAPSULE | Freq: Every day | ORAL | Status: DC

## 2014-12-18 NOTE — Telephone Encounter (Signed)
omniceph 300mg  : 600mg  daily for 7days

## 2014-12-18 NOTE — Telephone Encounter (Signed)
Spoke with pt, states she has a sinus infection.  Saw her PCP 2.5 weeks ago, was put on augmentin- 2 rounds of abx.  Has been on 2nd round since Saturday.   Pt still c/o sinus swelling on L side.  Has been using neti pot and flonase.  Pt has mucus production with white mucus, chills, fever running around 99.8.  States her bp has been running high.   Is requesting further recs.    Dr. Joya Gaskins please advise.  Thanks!

## 2014-12-18 NOTE — Telephone Encounter (Signed)
Rx has been sent in per PW. Pt is aware. Nothing further was needed.

## 2014-12-19 DIAGNOSIS — M25572 Pain in left ankle and joints of left foot: Secondary | ICD-10-CM | POA: Diagnosis not present

## 2014-12-23 DIAGNOSIS — C9000 Multiple myeloma not having achieved remission: Secondary | ICD-10-CM | POA: Diagnosis not present

## 2014-12-23 DIAGNOSIS — Z5111 Encounter for antineoplastic chemotherapy: Secondary | ICD-10-CM | POA: Diagnosis not present

## 2014-12-23 DIAGNOSIS — R112 Nausea with vomiting, unspecified: Secondary | ICD-10-CM | POA: Diagnosis not present

## 2014-12-23 DIAGNOSIS — I10 Essential (primary) hypertension: Secondary | ICD-10-CM | POA: Diagnosis not present

## 2014-12-23 DIAGNOSIS — E860 Dehydration: Secondary | ICD-10-CM | POA: Diagnosis not present

## 2014-12-24 DIAGNOSIS — M25572 Pain in left ankle and joints of left foot: Secondary | ICD-10-CM | POA: Diagnosis not present

## 2014-12-25 DIAGNOSIS — M7752 Other enthesopathy of left foot: Secondary | ICD-10-CM | POA: Diagnosis not present

## 2014-12-25 DIAGNOSIS — M25472 Effusion, left ankle: Secondary | ICD-10-CM | POA: Diagnosis not present

## 2014-12-25 DIAGNOSIS — M25572 Pain in left ankle and joints of left foot: Secondary | ICD-10-CM | POA: Diagnosis not present

## 2014-12-26 ENCOUNTER — Telehealth: Payer: Self-pay | Admitting: Critical Care Medicine

## 2014-12-26 DIAGNOSIS — M25572 Pain in left ankle and joints of left foot: Secondary | ICD-10-CM | POA: Diagnosis not present

## 2014-12-26 NOTE — Telephone Encounter (Addendum)
lmtcb x1 Per JJ use TP's 4pm slot.  Pt is aware that PW wants her to be seen today. States that she really wants to see PW. Advised her that he does not have any availability. She wants to think about seeing TP and will call us back. Advised her that we will need to know ASAP so that way we can schedule someone else if she declines. She verbalized understanding.

## 2014-12-26 NOTE — Telephone Encounter (Signed)
Will need OV with TP to sort out

## 2014-12-26 NOTE — Telephone Encounter (Signed)
Spoke with pt. Reports facial swelling and sinus pressure. States that the antibiotic PW gave her last only worked for 1 day. Would like PW's recommendations on what to do.  PW - please advise. Thanks.

## 2014-12-30 DIAGNOSIS — M25711 Osteophyte, right shoulder: Secondary | ICD-10-CM | POA: Diagnosis not present

## 2014-12-30 DIAGNOSIS — Z452 Encounter for adjustment and management of vascular access device: Secondary | ICD-10-CM | POA: Diagnosis not present

## 2014-12-30 DIAGNOSIS — M2578 Osteophyte, vertebrae: Secondary | ICD-10-CM | POA: Diagnosis not present

## 2014-12-30 DIAGNOSIS — C9000 Multiple myeloma not having achieved remission: Secondary | ICD-10-CM | POA: Diagnosis not present

## 2014-12-30 DIAGNOSIS — M5187 Other intervertebral disc disorders, lumbosacral region: Secondary | ICD-10-CM | POA: Diagnosis not present

## 2014-12-30 DIAGNOSIS — M25712 Osteophyte, left shoulder: Secondary | ICD-10-CM | POA: Diagnosis not present

## 2014-12-30 DIAGNOSIS — M25732 Osteophyte, left wrist: Secondary | ICD-10-CM | POA: Diagnosis not present

## 2014-12-30 DIAGNOSIS — M25719 Osteophyte, unspecified shoulder: Secondary | ICD-10-CM | POA: Diagnosis not present

## 2014-12-30 NOTE — Telephone Encounter (Signed)
Spoke with pt. Advised that she wants to be scheduled with PW for his next available. ROV has been scheduled for 01/15/15 at 11:30am.

## 2014-12-31 DIAGNOSIS — I129 Hypertensive chronic kidney disease with stage 1 through stage 4 chronic kidney disease, or unspecified chronic kidney disease: Secondary | ICD-10-CM | POA: Diagnosis not present

## 2015-01-02 DIAGNOSIS — F411 Generalized anxiety disorder: Secondary | ICD-10-CM | POA: Diagnosis not present

## 2015-01-03 DIAGNOSIS — M25572 Pain in left ankle and joints of left foot: Secondary | ICD-10-CM | POA: Diagnosis not present

## 2015-01-06 DIAGNOSIS — Z5111 Encounter for antineoplastic chemotherapy: Secondary | ICD-10-CM | POA: Diagnosis not present

## 2015-01-06 DIAGNOSIS — C9000 Multiple myeloma not having achieved remission: Secondary | ICD-10-CM | POA: Diagnosis not present

## 2015-01-07 DIAGNOSIS — Z79899 Other long term (current) drug therapy: Secondary | ICD-10-CM | POA: Diagnosis not present

## 2015-01-07 DIAGNOSIS — E780 Pure hypercholesterolemia: Secondary | ICD-10-CM | POA: Diagnosis not present

## 2015-01-07 DIAGNOSIS — R5382 Chronic fatigue, unspecified: Secondary | ICD-10-CM | POA: Diagnosis not present

## 2015-01-07 DIAGNOSIS — I10 Essential (primary) hypertension: Secondary | ICD-10-CM | POA: Diagnosis not present

## 2015-01-07 DIAGNOSIS — R202 Paresthesia of skin: Secondary | ICD-10-CM | POA: Diagnosis not present

## 2015-01-07 DIAGNOSIS — Z7982 Long term (current) use of aspirin: Secondary | ICD-10-CM | POA: Diagnosis not present

## 2015-01-07 DIAGNOSIS — Z6826 Body mass index (BMI) 26.0-26.9, adult: Secondary | ICD-10-CM | POA: Diagnosis not present

## 2015-01-07 DIAGNOSIS — Z8673 Personal history of transient ischemic attack (TIA), and cerebral infarction without residual deficits: Secondary | ICD-10-CM | POA: Diagnosis not present

## 2015-01-07 DIAGNOSIS — Z85820 Personal history of malignant melanoma of skin: Secondary | ICD-10-CM | POA: Diagnosis not present

## 2015-01-07 DIAGNOSIS — Z5181 Encounter for therapeutic drug level monitoring: Secondary | ICD-10-CM | POA: Diagnosis not present

## 2015-01-07 DIAGNOSIS — C9000 Multiple myeloma not having achieved remission: Secondary | ICD-10-CM | POA: Diagnosis not present

## 2015-01-07 DIAGNOSIS — R200 Anesthesia of skin: Secondary | ICD-10-CM | POA: Diagnosis not present

## 2015-01-07 DIAGNOSIS — E039 Hypothyroidism, unspecified: Secondary | ICD-10-CM | POA: Diagnosis not present

## 2015-01-10 DIAGNOSIS — M25472 Effusion, left ankle: Secondary | ICD-10-CM | POA: Diagnosis not present

## 2015-01-10 DIAGNOSIS — M25572 Pain in left ankle and joints of left foot: Secondary | ICD-10-CM | POA: Diagnosis not present

## 2015-01-10 DIAGNOSIS — M7752 Other enthesopathy of left foot: Secondary | ICD-10-CM | POA: Diagnosis not present

## 2015-01-10 DIAGNOSIS — M7672 Peroneal tendinitis, left leg: Secondary | ICD-10-CM | POA: Diagnosis not present

## 2015-01-13 DIAGNOSIS — Z5111 Encounter for antineoplastic chemotherapy: Secondary | ICD-10-CM | POA: Diagnosis not present

## 2015-01-13 DIAGNOSIS — C9000 Multiple myeloma not having achieved remission: Secondary | ICD-10-CM | POA: Diagnosis not present

## 2015-01-14 DIAGNOSIS — M25572 Pain in left ankle and joints of left foot: Secondary | ICD-10-CM | POA: Diagnosis not present

## 2015-01-15 ENCOUNTER — Ambulatory Visit: Payer: BLUE CROSS/BLUE SHIELD | Admitting: Critical Care Medicine

## 2015-01-17 DIAGNOSIS — M25572 Pain in left ankle and joints of left foot: Secondary | ICD-10-CM | POA: Diagnosis not present

## 2015-01-20 DIAGNOSIS — C9000 Multiple myeloma not having achieved remission: Secondary | ICD-10-CM | POA: Diagnosis not present

## 2015-01-20 DIAGNOSIS — Z5111 Encounter for antineoplastic chemotherapy: Secondary | ICD-10-CM | POA: Diagnosis not present

## 2015-01-21 DIAGNOSIS — N63 Unspecified lump in breast: Secondary | ICD-10-CM | POA: Diagnosis not present

## 2015-01-21 DIAGNOSIS — N6002 Solitary cyst of left breast: Secondary | ICD-10-CM | POA: Diagnosis not present

## 2015-01-21 DIAGNOSIS — M25572 Pain in left ankle and joints of left foot: Secondary | ICD-10-CM | POA: Diagnosis not present

## 2015-01-22 ENCOUNTER — Ambulatory Visit (INDEPENDENT_AMBULATORY_CARE_PROVIDER_SITE_OTHER): Payer: Medicare Other | Admitting: Critical Care Medicine

## 2015-01-22 ENCOUNTER — Telehealth: Payer: Self-pay | Admitting: Critical Care Medicine

## 2015-01-22 ENCOUNTER — Encounter: Payer: Self-pay | Admitting: Critical Care Medicine

## 2015-01-22 VITALS — BP 128/72 | HR 75 | Temp 97.8°F | Ht 61.0 in | Wt 139.0 lb

## 2015-01-22 DIAGNOSIS — J4540 Moderate persistent asthma, uncomplicated: Secondary | ICD-10-CM

## 2015-01-22 DIAGNOSIS — R600 Localized edema: Secondary | ICD-10-CM

## 2015-01-22 DIAGNOSIS — R609 Edema, unspecified: Secondary | ICD-10-CM

## 2015-01-22 DIAGNOSIS — H1012 Acute atopic conjunctivitis, left eye: Secondary | ICD-10-CM | POA: Diagnosis not present

## 2015-01-22 NOTE — Telephone Encounter (Addendum)
Pt returning call can be reached @ [phone].[name]

## 2015-01-22 NOTE — Telephone Encounter (Signed)
lmtcb for pt.  

## 2015-01-22 NOTE — Telephone Encounter (Signed)
lmtcb

## 2015-01-22 NOTE — Assessment & Plan Note (Signed)
Periorbital edema L>R.  Poss sinusitis Neg CT sinus 2014 Plan Repeat CT sinus

## 2015-01-22 NOTE — Patient Instructions (Signed)
Sinus CT will be obtained No other changes for now Stay on Asmanex I will call CT results Return 3 months

## 2015-01-22 NOTE — Progress Notes (Signed)
Subjective:    Patient ID: [name], female    DOB: Mar 19, 1941, 74 y.o.   MRN: [id number]  HPI  01/22/2015 Chief Complaint  Patient presents with  . Follow-up    Pt c/o occasional SOB with activity, sinus drainage, prod cough with thick off white mucus.  Slight chest congestion/tightness.   Now on chemorx for myeloma.  Feels sinus is irritated. No recent imaging.  Pt still with cough daily with pndrip.  Notes some DOE.  Notes some wheezing  In past, not now   Review of Systems Constitutional:   No  weight loss, night sweats,  Fevers, chills, fatigue, lassitude. HEENT:   No headaches,  Difficulty swallowing,  Tooth/dental problems,  Sore throat,                No sneezing, itching, ear ache, +++  nasal congestion, +++ post nasal drip,   CV:  No chest pain,  Orthopnea, PND, swelling in lower extremities, anasarca, dizziness, palpitations  GI  No heartburn, indigestion, abdominal pain, nausea, vomiting, diarrhea, change in bowel habits, loss of appetite  Resp: Notes shortness of breath with exertion or at rest.  No excess mucus, no productive cough,  No non-productive cough,  No coughing up of blood.  No change in color of mucus.  No wheezing.  No chest wall deformity  Skin: no rash or lesions.  GU: no dysuria, change in color of urine, no urgency or frequency.  No flank pain.  MS:  No joint pain or swelling.  No decreased range of motion.  No back pain.  Psych:  No change in mood or affect. No depression or anxiety.  No memory loss.     Objective:   Physical Exam Filed Vitals:   01/22/15 1157  BP: 128/72  Pulse: 75  Temp: 97.8 F (36.6 C)  TempSrc: Oral  Height: 5\' 1"  (1.549 m)  Weight: 139 lb (63.05 kg)  SpO2: 96%    Gen: Pleasant, well-nourished, in no distress,  normal affect  ENT: No lesions,  mouth clear,  oropharynx clear, +++ postnasal drip  Neck: No JVD, no TMG, no carotid bruits  Lungs: No use of accessory muscles, no dullness to percussion, clear  without rales or rhonchi  Cardiovascular: RRR, heart sounds normal, no murmur or gallops, no peripheral edema  Abdomen: soft and NT, no HSM,  BS normal  Musculoskeletal: No deformities, no cyanosis or clubbing  Neuro: alert, non focal  Skin: Warm, no lesions or rashes  No results found.        Assessment & Plan:   Periorbital edema Periorbital edema L>R.  Poss sinusitis Neg CT sinus 2014 Plan Repeat CT sinus    Extrinsic asthma Stable mod persis asthma Cont asmanex     Updated Medication List Outpatient Encounter Prescriptions as of 01/22/2015  Medication Sig  . acetaminophen (TYLENOL) 325 MG tablet Take 325 mg by mouth.  [name] albuterol (VENTOLIN HFA) 108 (90 BASE) MCG/ACT inhaler Inhale 2 puffs into the lungs every 6 (six) hours as needed for wheezing or shortness of breath.   . ALPRAZolam (XANAX) 0.5 MG tablet Take 0.5 mg by mouth daily as needed for anxiety.   [name] amLODipine (NORVASC) 5 MG tablet Take 1 tablet (5 mg total) by mouth daily.  [name] aspirin 81 MG chewable tablet Chew 81 mg by mouth at bedtime.   . clonazePAM (KLONOPIN) 0.5 MG tablet Take 0.25-0.5 mg by mouth at bedtime as needed for anxiety.   [name] dextromethorphan (  DELSYM) 30 MG/5ML liquid Take 60 mg by mouth.  . fexofenadine (ALLEGRA) 180 MG tablet HOLD for ONE WEEK then RESUME DAILY  . fluticasone (FLONASE) 50 MCG/ACT nasal spray Place 2 sprays into both nostrils 2 (two) times daily.  [name] levothyroxine (SYNTHROID, LEVOTHROID) 125 MCG tablet Take 125 mcg by mouth daily.    [name] MICARDIS 40 MG tablet Take 1 tablet (40 mg total) by mouth daily.  . mometasone (ASMANEX) 220 MCG/INH inhaler Inhale 2 puffs into the lungs daily.  . simvastatin (ZOCOR) 20 MG tablet Take 1 tablet (20 mg total) by mouth every evening.  . [DISCONTINUED] PATANOL 0.1 % ophthalmic solution Place 1 drop into the left eye 2 (two) times daily.  [name] dexamethasone (DECADRON) 4 MG tablet   . [DISCONTINUED] azelastine (ASTELIN) 0.1 % nasal spray Place 2  sprays into both nostrils 2 (two) times daily. (Patient not taking: Reported on 01/22/2015)  . [DISCONTINUED] cefdinir (OMNICEF) 300 MG capsule Take 2 capsules (600 mg total) by mouth daily. (Patient not taking: Reported on 01/22/2015)

## 2015-01-22 NOTE — Assessment & Plan Note (Signed)
Stable mod persis asthma Cont asmanex

## 2015-01-23 ENCOUNTER — Other Ambulatory Visit: Payer: Self-pay | Admitting: *Deleted

## 2015-01-23 DIAGNOSIS — E7800 Pure hypercholesterolemia, unspecified: Secondary | ICD-10-CM

## 2015-01-23 MED ORDER — SIMVASTATIN 20 MG PO TABS: 20.0000 mg | ORAL_TABLET | Freq: Every evening | ORAL | Status: DC

## 2015-01-23 NOTE — Telephone Encounter (Signed)
Called and spoke to pt. Pt stated she had a question about the supplement of Melatonin to help her sleep. Pt stated she would have to call us back as she was on another call.   Will await call.

## 2015-01-27 DIAGNOSIS — D3121 Benign neoplasm of right retina: Secondary | ICD-10-CM | POA: Diagnosis not present

## 2015-01-27 DIAGNOSIS — H31091 Other chorioretinal scars, right eye: Secondary | ICD-10-CM | POA: Diagnosis not present

## 2015-01-27 DIAGNOSIS — H31092 Other chorioretinal scars, left eye: Secondary | ICD-10-CM | POA: Diagnosis not present

## 2015-01-27 DIAGNOSIS — H3581 Retinal edema: Secondary | ICD-10-CM | POA: Diagnosis not present

## 2015-01-27 DIAGNOSIS — C6931 Malignant neoplasm of right choroid: Secondary | ICD-10-CM | POA: Diagnosis not present

## 2015-01-27 DIAGNOSIS — D3122 Benign neoplasm of left retina: Secondary | ICD-10-CM | POA: Diagnosis not present

## 2015-01-28 ENCOUNTER — Ambulatory Visit (INDEPENDENT_AMBULATORY_CARE_PROVIDER_SITE_OTHER)
Admission: RE | Admit: 2015-01-28 | Discharge: 2015-01-28 | Disposition: A | Discharge status: Home / Self Care | Payer: Medicare Other | Source: Ambulatory Visit | Attending: Critical Care Medicine | Admitting: Critical Care Medicine

## 2015-01-28 DIAGNOSIS — R600 Localized edema: Secondary | ICD-10-CM

## 2015-01-28 DIAGNOSIS — J3489 Other specified disorders of nose and nasal sinuses: Secondary | ICD-10-CM | POA: Diagnosis not present

## 2015-01-28 DIAGNOSIS — R609 Edema, unspecified: Secondary | ICD-10-CM | POA: Diagnosis not present

## 2015-01-28 NOTE — Telephone Encounter (Signed)
Pt states she got Melatonin 5 mg gummies to use to help her sleep; 2 gummies is one serving per bottle but patient would like to know what dose to take that would be safe for her.    PW please advise. Thanks.

## 2015-01-28 NOTE — Telephone Encounter (Signed)
Pt returned call  [phone]

## 2015-01-28 NOTE — Progress Notes (Signed)
Quick Note:  Notify the patient that the CT sinus is completely clear, no sinus infection currently present No change in medications are recommended. Continue current meds as prescribed at last office visit ______

## 2015-01-28 NOTE — Telephone Encounter (Signed)
5mg  qhs is the correct dose

## 2015-01-28 NOTE — Telephone Encounter (Signed)
Pt is aware of 5 mg QHS is the corrects dose. Nothing more needed at this time.

## 2015-01-28 NOTE — Telephone Encounter (Signed)
lmtcb for pt on both numbers.  

## 2015-01-29 NOTE — Progress Notes (Signed)
Quick Note:  Called, spoke with pt. Discussed CT Sinus results and recs per Dr. Joya Gaskins. She verbalized understanding and voiced no further questions or concerns at this time. ______

## 2015-01-31 DIAGNOSIS — L0291 Cutaneous abscess, unspecified: Secondary | ICD-10-CM | POA: Diagnosis not present

## 2015-01-31 DIAGNOSIS — Z85828 Personal history of other malignant neoplasm of skin: Secondary | ICD-10-CM | POA: Diagnosis not present

## 2015-01-31 DIAGNOSIS — L723 Sebaceous cyst: Secondary | ICD-10-CM | POA: Diagnosis not present

## 2015-01-31 DIAGNOSIS — Z85820 Personal history of malignant melanoma of skin: Secondary | ICD-10-CM | POA: Diagnosis not present

## 2015-01-31 DIAGNOSIS — I10 Essential (primary) hypertension: Secondary | ICD-10-CM | POA: Diagnosis not present

## 2015-01-31 DIAGNOSIS — L02412 Cutaneous abscess of left axilla: Secondary | ICD-10-CM | POA: Diagnosis not present

## 2015-01-31 DIAGNOSIS — C9000 Multiple myeloma not having achieved remission: Secondary | ICD-10-CM | POA: Diagnosis not present

## 2015-02-03 ENCOUNTER — Encounter (HOSPITAL_COMMUNITY): Payer: Self-pay

## 2015-02-03 ENCOUNTER — Emergency Department (HOSPITAL_COMMUNITY)
Admission: EM | Admit: 2015-02-03 | Discharge: 2015-02-04 | Disposition: A | Discharge status: Home / Self Care | Payer: Medicare Other | Attending: Emergency Medicine | Admitting: Emergency Medicine

## 2015-02-03 DIAGNOSIS — Z7982 Long term (current) use of aspirin: Secondary | ICD-10-CM | POA: Diagnosis not present

## 2015-02-03 DIAGNOSIS — Z85850 Personal history of malignant neoplasm of thyroid: Secondary | ICD-10-CM | POA: Insufficient documentation

## 2015-02-03 DIAGNOSIS — Z7951 Long term (current) use of inhaled steroids: Secondary | ICD-10-CM | POA: Insufficient documentation

## 2015-02-03 DIAGNOSIS — Z91040 Latex allergy status: Secondary | ICD-10-CM | POA: Insufficient documentation

## 2015-02-03 DIAGNOSIS — Z8719 Personal history of other diseases of the digestive system: Secondary | ICD-10-CM | POA: Insufficient documentation

## 2015-02-03 DIAGNOSIS — N189 Chronic kidney disease, unspecified: Secondary | ICD-10-CM | POA: Insufficient documentation

## 2015-02-03 DIAGNOSIS — N61 Inflammatory disorders of breast: Secondary | ICD-10-CM | POA: Insufficient documentation

## 2015-02-03 DIAGNOSIS — E079 Disorder of thyroid, unspecified: Secondary | ICD-10-CM | POA: Insufficient documentation

## 2015-02-03 DIAGNOSIS — F419 Anxiety disorder, unspecified: Secondary | ICD-10-CM | POA: Insufficient documentation

## 2015-02-03 DIAGNOSIS — Z79899 Other long term (current) drug therapy: Secondary | ICD-10-CM | POA: Diagnosis not present

## 2015-02-03 DIAGNOSIS — M1990 Unspecified osteoarthritis, unspecified site: Secondary | ICD-10-CM | POA: Insufficient documentation

## 2015-02-03 DIAGNOSIS — Z85820 Personal history of malignant melanoma of skin: Secondary | ICD-10-CM | POA: Diagnosis not present

## 2015-02-03 DIAGNOSIS — E785 Hyperlipidemia, unspecified: Secondary | ICD-10-CM | POA: Diagnosis not present

## 2015-02-03 DIAGNOSIS — Z5111 Encounter for antineoplastic chemotherapy: Secondary | ICD-10-CM | POA: Diagnosis not present

## 2015-02-03 DIAGNOSIS — I129 Hypertensive chronic kidney disease with stage 1 through stage 4 chronic kidney disease, or unspecified chronic kidney disease: Secondary | ICD-10-CM | POA: Insufficient documentation

## 2015-02-03 DIAGNOSIS — L089 Local infection of the skin and subcutaneous tissue, unspecified: Secondary | ICD-10-CM | POA: Diagnosis present

## 2015-02-03 DIAGNOSIS — Z8673 Personal history of transient ischemic attack (TIA), and cerebral infarction without residual deficits: Secondary | ICD-10-CM | POA: Insufficient documentation

## 2015-02-03 DIAGNOSIS — L03313 Cellulitis of chest wall: Secondary | ICD-10-CM | POA: Diagnosis not present

## 2015-02-03 DIAGNOSIS — C9000 Multiple myeloma not having achieved remission: Secondary | ICD-10-CM | POA: Diagnosis not present

## 2015-02-03 LAB — COMPREHENSIVE METABOLIC PANEL
ALBUMIN: 4.2 g/dL (ref 3.5–5.0)
ALT: 13 U/L — AB (ref 14–54)
ANION GAP: 7 (ref 5–15)
AST: 16 U/L (ref 15–41)
Alkaline Phosphatase: 44 U/L (ref 38–126)
BUN: 19 mg/dL (ref 6–20)
CO2: 23 mmol/L (ref 22–32)
Calcium: 9 mg/dL (ref 8.9–10.3)
Chloride: 105 mmol/L (ref 101–111)
Creatinine, Ser: 0.96 mg/dL (ref 0.44–1.00)
GFR, EST NON AFRICAN AMERICAN: 56 mL/min — AB (ref >60)
GLUCOSE: 100 mg/dL — AB (ref 70–99)
Potassium: 4 mmol/L (ref 3.5–5.1)
SODIUM: 135 mmol/L (ref 135–145)
Total Bilirubin: 0.4 mg/dL (ref 0.3–1.2)
Total Protein: 7.4 g/dL (ref 6.5–8.1)

## 2015-02-03 LAB — CBC WITH DIFFERENTIAL/PLATELET
BASOS PCT: 0 % (ref 0–1)
Basophils Absolute: 0 10*3/uL (ref 0.0–0.1)
Eosinophils Absolute: 0.2 10*3/uL (ref 0.0–0.7)
Eosinophils Relative: 3 % (ref 0–5)
HCT: 37.9 % (ref 36.0–46.0)
HEMOGLOBIN: 12.3 g/dL (ref 12.0–15.0)
LYMPHS ABS: 2 10*3/uL (ref 0.7–4.0)
LYMPHS PCT: 28 % (ref 12–46)
MCH: 28.4 pg (ref 26.0–34.0)
MCHC: 32.5 g/dL (ref 30.0–36.0)
MCV: 87.5 fL (ref 78.0–100.0)
Monocytes Absolute: 0.8 10*3/uL (ref 0.1–1.0)
Monocytes Relative: 11 % (ref 3–12)
NEUTROS ABS: 4.2 10*3/uL (ref 1.7–7.7)
Neutrophils Relative %: 58 % (ref 43–77)
PLATELETS: 314 10*3/uL (ref 150–400)
RBC: 4.33 MIL/uL (ref 3.87–5.11)
RDW: 12.9 % (ref 11.5–15.5)
WBC: 7.2 10*3/uL (ref 4.0–10.5)

## 2015-02-03 LAB — I-STAT CG4 LACTIC ACID, ED: LACTIC ACID, VENOUS: 1.51 mmol/L (ref 0.5–2.0)

## 2015-02-03 MED ORDER — LIDOCAINE-EPINEPHRINE (PF) 2 %-1:200000 IJ SOLN
20.0000 mL | Freq: Once | INTRAMUSCULAR | Status: AC
  Administered 2015-02-04: 20 mL
  Filled 2015-02-03: qty 20

## 2015-02-03 NOTE — ED Notes (Signed)
Patient had surgery on a cyst under her left arm 3 days PTA.  Area was circled and patient and family were told to watch redness and to seek emergency treatment if redness exceeded marked area.  She is a cancer patient.

## 2015-02-04 ENCOUNTER — Other Ambulatory Visit: Payer: Self-pay | Admitting: *Deleted

## 2015-02-04 DIAGNOSIS — N61 Inflammatory disorders of breast: Secondary | ICD-10-CM | POA: Diagnosis not present

## 2015-02-04 MED ORDER — CEPHALEXIN 500 MG PO CAPS: 500.0000 mg | ORAL_CAPSULE | Freq: Three times a day (TID) | ORAL | Status: DC

## 2015-02-04 MED ORDER — CEPHALEXIN 500 MG PO CAPS
500.0000 mg | ORAL_CAPSULE | Freq: Once | ORAL | Status: AC
  Administered 2015-02-04: 500 mg via ORAL
  Filled 2015-02-04: qty 1

## 2015-02-04 NOTE — Discharge Instructions (Signed)

## 2015-02-04 NOTE — Patient Outreach (Signed)
[facility]) Care Management  02/04/2015  MEEYA GOLDIN 07/21/1941 [id number]  CSW received a new referral on patient from Sherrin Daisy, Telephonic Nurse Case Manager with [place] [facility].  According to Mrs. Tammi Klippel, CSW needs to make contact with patient to assess home situation and possible home care needs.  Patient currently lives at home alone and is undergoing chemotherapy for treatment of cancer.  Mrs. Tammi Klippel believes that patient has limited family support, despite having two son's, Berlin Viereck and Jovana Rembold, living in the area.  Mrs. Tammi Klippel encouraged CSW to discuss possible assisted living placement with patient. CSW made an initial attempt to try and contact patient today to perform phone assessment, without success.  CSW left a HIPAA compliant message for patient on voicemail and is currently awaiting a return call.  CSW will continue to try and make contact with patient to assess and assist with social work needs and services.  After thorough review of patient's EMR (electronic medical record) in [facility], CSW learned that patient presented to the Emergency Department at [facility] on Monday, May 2nd with complaints of worsening cellulitis under her left arm.  Patient is experiencing redness, swelling and drainage from the cyst under her arm, which was drained 3 days ago and antibiotics were initiated. CSW will notify Natividad Brood [facility] Liaison's with [place] [facility], of patient's hospital admission, requesting that patient receive a visit while hospitalized.  If patient is agreeable to placement, it would be appropriate to try and admit patient while hospitalized, to ensure a smoother transition for patient.  CSW will request that Mrs. Brewer and Ms. Hall contact CSW upon patient's release from the hospital.  Nat Christen, BSW, MSW, [place] [facility] [place], [place], [place] [address] [email or web address] [phone]

## 2015-02-04 NOTE — ED Provider Notes (Signed)
CSN: [id number]     Arrival date & time 02/03/15  2048 History   First MD Initiated Contact with Patient 02/03/15 2202     Chief Complaint  Patient presents with  . Wound Infection     HPI Patient presents emergency department with complaints of cyst under left arm.  She reports this developed increasing redness and swelling was drained 3 days ago.  She was started on antibiotics at that time.  She had a cellulitis apparently.  She is a cancer patient does receive chemotherapy.  She feels as though the cellulitis is slightly worsened.  She said no ongoing drainage.  Of note apparently she has had a small lump in the same area for approximately 5 months this is being followed by her team at the [facility].  She denies fevers or chills.   Past Medical History  Diagnosis Date  . 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 History of blood clots     LEG  . Stroke   . Hernia   . MGUS (monoclonal gammopathy of unknown significance)   . Chronic kidney disease   . Hypertension   . Hyperlipidemia   . Thyroid disease   . Arthritis   . Mitral valve prolapse   . Anxiety   . Perforated bowel   . Peritonitis   . Diverticulosis     Pt reported on 03/15/12  . MGUS (monoclonal gammopathy of unknown significance)   . Cancer     THYROID, SKIN, NOSE, BONE  . Melanoma   . Melanoma 1989    chest   Past Surgical History  Procedure Laterality Date  . Abdominal hysterectomy    . Rotator cuff repair  2004    RIGHT SHOULDER  . Hernia repair    . Leg surgery      VEIN & BLOOD CLOT  . Cardiovascular stress test  2006    NORMAL  . Transthoracic echocardiogram  2006  . Melanoma excision      R eye  . Skin cancer excision  2011, 2013    squamous cell of nose x 2  . Breast lumpectomy  12/2010    R breast  . Breast lumpectomy  2004    L axilla neg for cancer.   Family History  Problem Relation Age of Onset  . Stroke Mother   . Asthma Father   . Diabetes Father   . Cancer Father    . Heart failure Father   . Cancer Sister   . Diabetes Sister   . Cancer Brother   . Cancer Father     Prostate and kidney cancer  . Stroke Mother 38    Her mother passed away from this stroke at the age of 63  . Cervical cancer Sister   . Cancer Sister     Recurrent cancer involving the neck and the jaw   History  Substance Use Topics  . Smoking status: Never Smoker   . Smokeless tobacco: Never Used  . Alcohol Use: No   OB History    No data available     Review of Systems  All other systems reviewed and are negative.     Allergies  Decadrol; Doxycycline; Lipitor; Aldactone; Hydrocodone; Levofloxacin; Losartan; Prednisone; Pseudoephedrine; Quinolones; Sertraline; Sertraline hcl; Sudafed; Trazodone and nefazodone; Ultram; Adhesive; Hydrocodone-acetaminophen; and Latex  Home Medications   Prior to Admission medications   Medication Sig Start Date End Date Taking? Authorizing Provider  acetaminophen (TYLENOL)  500 MG tablet Take 250 mg by mouth every 6 (six) hours as needed for moderate pain.   Yes Historical Provider, MD  albuterol (VENTOLIN HFA) 108 (90 BASE) MCG/ACT inhaler Inhale 2 puffs into the lungs every 6 (six) hours as needed for wheezing or shortness of breath.    Yes Historical Provider, MD  ALPRAZolam Duanne Moron) 0.5 MG tablet Take 0.5 mg by mouth daily as needed for anxiety.  07/26/11  Yes Historical Provider, MD  amLODipine (NORVASC) 5 MG tablet Take 1 tablet (5 mg total) by mouth daily. 11/07/12  Yes Darlin Coco, MD  aspirin 81 MG chewable tablet Chew 81 mg by mouth at bedtime.  07/26/11  Yes Historical Provider, MD  clonazePAM (KLONOPIN) 0.5 MG tablet Take 0.25-0.5 mg by mouth at bedtime as needed for anxiety.  05/06/12  Yes Historical Provider, MD  dextromethorphan (DELSYM) 30 MG/5ML liquid Take 60 mg by mouth every 12 (twelve) hours as needed for cough.    Yes Historical Provider, MD  fexofenadine (ALLEGRA) 180 MG tablet HOLD for ONE WEEK then [place] Patient taking differently: Take 90-180 mg by mouth daily. HOLD for ONE WEEK then RESUME DAILY 07/04/14  Yes Elsie Stain, MD  fluticasone [facility]) 50 MCG/ACT nasal spray Place 2 sprays into both nostrils 2 (two) times daily. 08/12/14  Yes Elsie Stain, MD  levothyroxine (SYNTHROID, LEVOTHROID) 125 MCG tablet Take 125 mcg by mouth daily.     Yes Historical Provider, MD  MICARDIS 40 MG tablet Take 1 tablet (40 mg total) by mouth daily. 11/27/12  Yes Darlin Coco, MD  mometasone [facility]) 220 MCG/INH inhaler Inhale 2 puffs into the lungs daily as needed (shortness of breath).    Yes Historical Provider, MD  neomycin-polymyxin b-dexamethasone (MAXITROL) 3.5-10000-0.1 OINT Place 1 application into the left eye 3 (three) times daily. Do not let it get on skin 01/28/15  Yes Historical Provider, MD  simvastatin (ZOCOR) 20 MG tablet Take 1 tablet (20 mg total) by mouth every evening. 01/23/15 03/06/16 Yes Darlin Coco, MD  sulfamethoxazole-trimethoprim (BACTRIM DS,SEPTRA DS) 800-160 MG per tablet Take 1 tablet by mouth 2 (two) times daily. 01/31/15-02/06/15 01/31/15  Yes Historical Provider, MD  cephALEXin (KEFLEX) 500 MG capsule Take 1 capsule (500 mg total) by mouth 3 (three) times daily. 02/04/15   Jola Schmidt, MD   BP 125/94 mmHg  Pulse 70  Temp(Src) 97.9 F (36.6 C) (Oral)  Resp 18  Ht 5\' 1"  (1.549 m)  Wt 137 lb (62.143 kg)  BMI 25.90 kg/m2  SpO2 99% Physical Exam  Constitutional: She is oriented to person, place, and time. She appears well-developed and well-nourished. No distress.  HENT:  Head: Normocephalic and atraumatic.  Eyes: EOM are normal.  Neck: Normal range of motion.  Cardiovascular: Normal rate, regular rhythm and normal heart sounds.   Pulmonary/Chest: Effort normal and breath sounds normal.  Left lateral chest wall just at the lateral edge of her breast Thursday small wound filled with blood clot with surrounding erythema proximally fist sized.  She has mild  tenderness and warmth of this area.  It is just slightly outside the marked line.  Abdominal: Soft. She exhibits no distension.  Musculoskeletal: Normal range of motion.  Neurological: She is alert and oriented to person, place, and time.  Skin: Skin is warm and dry.  Psychiatric: She has a normal mood and affect. Judgment normal.  Nursing note and vitals reviewed.   ED Course  Procedures (including critical care time)  INCISION AND  DRAINAGE Performed by: Hoy Morn Consent: Verbal consent obtained. Risks and benefits: risks, benefits and alternatives were discussed Time out performed prior to procedure Type: abscess Body area: Left lateral breast Anesthesia: local infiltration Incision was made with a scalpel. Local anesthetic: lidocaine 2 % with epinephrine Anesthetic total: 10 ml Complexity: complex Blunt dissection to break up loculations Drainage: Clotted blood  Drainage amount: Small  Packing material: None  Patient tolerance: Patient tolerated the procedure well with no immediate complications.     Labs Review Labs Reviewed  COMPREHENSIVE METABOLIC PANEL - Abnormal; Notable for the following:    Glucose, Bld 100 (*)    ALT 13 (*)    GFR calc non Af Amer 56 (*)    All other components within normal limits  CBC WITH DIFFERENTIAL/PLATELET  I-STAT CG4 LACTIC ACID, ED  I-STAT CG4 LACTIC ACID, ED    Imaging Review No results found.   EKG Interpretation None      MDM   Final diagnoses:  Cellulitis of breast   I do wonder about the possibility of a necrotic breast cancer.  Asked that she follow-up with her breast team closely for this.  I performed incision and drainage of the area to see if there is any more pus.  No pus was encountered only small blood clots were removed.  The does appear to be small surrounding erythema just moving outside the wound edges.  She is on Bactrim.  I will add Keflex to this.  I do not think she is admission the hospital this  time.  She is afebrile with normal vital signs.  Her blood cell count is normal.  Vas that she return to the ER for any new or worsening symptoms.  She will definitely need this area followed closely and I have described to her the possibility of a necrotic breast cancer.  Hopefully this will encourage her to follow-up closely.    Jola Schmidt, MD 02/04/15 [phone]

## 2015-02-10 DIAGNOSIS — Z5111 Encounter for antineoplastic chemotherapy: Secondary | ICD-10-CM | POA: Diagnosis not present

## 2015-02-10 DIAGNOSIS — C9000 Multiple myeloma not having achieved remission: Secondary | ICD-10-CM | POA: Diagnosis not present

## 2015-02-12 DIAGNOSIS — H2512 Age-related nuclear cataract, left eye: Secondary | ICD-10-CM | POA: Diagnosis not present

## 2015-02-12 DIAGNOSIS — H0015 Chalazion left lower eyelid: Secondary | ICD-10-CM | POA: Diagnosis not present

## 2015-02-13 ENCOUNTER — Other Ambulatory Visit: Payer: Self-pay | Admitting: *Deleted

## 2015-02-13 ENCOUNTER — Encounter: Payer: Self-pay | Admitting: *Deleted
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
## 2015-02-17 DIAGNOSIS — Z5111 Encounter for antineoplastic chemotherapy: Secondary | ICD-10-CM | POA: Diagnosis not present

## 2015-02-17 DIAGNOSIS — C9000 Multiple myeloma not having achieved remission: Secondary | ICD-10-CM | POA: Diagnosis not present

## 2015-02-18 ENCOUNTER — Telehealth: Payer: Self-pay | Admitting: Cardiology

## 2015-02-18 NOTE — Telephone Encounter (Signed)
New Message   Patient needs a call back in regards to her medication that she needs help on. Patient will explain more when nurse call. Please give patient a call.

## 2015-02-18 NOTE — Telephone Encounter (Signed)
Spoke with patient and she stated Dr Deterding's office tried to get her Micardis approved and it was denied She requested  Dr. Mare Ferrari do the appeal for her I explained to the patient that Dr Deterding's office would need to try to appeal for her Patient stated she would call his office

## 2015-02-19 ENCOUNTER — Ambulatory Visit: Payer: BLUE CROSS/BLUE SHIELD | Admitting: Critical Care Medicine

## 2015-02-20 ENCOUNTER — Other Ambulatory Visit: Payer: Self-pay | Admitting: *Deleted

## 2015-02-20 NOTE — Patient Outreach (Signed)
[facility]) Care Management  02/20/2015  [name] 10/21/1940 [id number]  MD referral;   Telephone call to patient. Patient advised of [facility] care management services of RN care coordinator.  Patient lives alone and is independent with self care. Walks without difficulty and no use of assistive devices.    Patient voices that she is currently seeing primary care doctor and other specialists as recommended by her doctors. States currently undergoing treatment at [facility] for multiple myeloma. States she is able to drive to her local appointments but someone from her church takes her to get treatments at hospital.  Has minimal family support.  Several members of her church offer her support.  Psychosocial concerns have been addressed by [facility] clinical social worker.   Uses local pharmacy for prescriptions medication and has no problems with co pays. States she is compliant with taking medications as prescribes and knows why she is taking medications.  States she has good understanding of her current medical conditions and knows how to manage.  Does not feel she needs any disease management at this time.  She was given contact information for [facility] care management services.  Patient understands and will call if needed.  Case closed  Sherrin Daisy, RN BSN CCM Care Management Coordinator [facility] Care Management  [phone] .

## 2015-02-26 DIAGNOSIS — C9000 Multiple myeloma not having achieved remission: Secondary | ICD-10-CM | POA: Diagnosis not present

## 2015-02-28 ENCOUNTER — Other Ambulatory Visit (INDEPENDENT_AMBULATORY_CARE_PROVIDER_SITE_OTHER): Payer: Medicare Other | Admitting: *Deleted

## 2015-02-28 ENCOUNTER — Telehealth: Payer: Self-pay | Admitting: Physician Assistant

## 2015-02-28 ENCOUNTER — Ambulatory Visit (INDEPENDENT_AMBULATORY_CARE_PROVIDER_SITE_OTHER): Payer: Medicare Other | Admitting: Cardiology

## 2015-02-28 ENCOUNTER — Encounter: Payer: Self-pay | Admitting: Cardiology

## 2015-02-28 VITALS — BP 130/84 | HR 75 | Ht 61.0 in | Wt 139.0 lb

## 2015-02-28 DIAGNOSIS — S80861A Insect bite (nonvenomous), right lower leg, initial encounter: Secondary | ICD-10-CM | POA: Diagnosis not present

## 2015-02-28 DIAGNOSIS — E039 Hypothyroidism, unspecified: Secondary | ICD-10-CM

## 2015-02-28 DIAGNOSIS — C9000 Multiple myeloma not having achieved remission: Secondary | ICD-10-CM | POA: Diagnosis not present

## 2015-02-28 DIAGNOSIS — E7800 Pure hypercholesterolemia, unspecified: Secondary | ICD-10-CM

## 2015-02-28 DIAGNOSIS — M25572 Pain in left ankle and joints of left foot: Secondary | ICD-10-CM | POA: Diagnosis not present

## 2015-02-28 DIAGNOSIS — I119 Hypertensive heart disease without heart failure: Secondary | ICD-10-CM

## 2015-02-28 DIAGNOSIS — R238 Other skin changes: Secondary | ICD-10-CM | POA: Diagnosis not present

## 2015-02-28 LAB — LIPID PANEL
CHOL/HDL RATIO: 3
Cholesterol: 177 mg/dL (ref 0–200)
HDL: 53.8 mg/dL (ref >39.00)
LDL Cholesterol: 102 mg/dL — ABNORMAL HIGH (ref 0–99)
NonHDL: 123.2
Triglycerides: 107 mg/dL (ref 0.0–149.0)
VLDL: 21.4 mg/dL (ref 0.0–40.0)

## 2015-02-28 LAB — HEPATIC FUNCTION PANEL
ALT: 13 U/L (ref 0–35)
AST: 17 U/L (ref 0–37)
Albumin: 4.2 g/dL (ref 3.5–5.2)
Alkaline Phosphatase: 40 U/L (ref 39–117)
BILIRUBIN DIRECT: 0.1 mg/dL (ref 0.0–0.3)
BILIRUBIN TOTAL: 0.9 mg/dL (ref 0.2–1.2)
Total Protein: 7.3 g/dL (ref 6.0–8.3)

## 2015-02-28 LAB — BASIC METABOLIC PANEL
BUN: 12 mg/dL (ref 6–23)
CO2: 32 meq/L (ref 19–32)
Calcium: 9.7 mg/dL (ref 8.4–10.5)
Chloride: 103 mEq/L (ref 96–112)
Creatinine, Ser: 0.76 mg/dL (ref 0.40–1.20)
GFR: 78.69 mL/min (ref >60.00)
Glucose, Bld: 92 mg/dL (ref 70–99)
Potassium: 4 mEq/L (ref 3.5–5.1)
SODIUM: 139 meq/L (ref 135–145)

## 2015-02-28 NOTE — Telephone Encounter (Addendum)
Pt called regarding tick bite. Tick is no longer attached. She said she showed it to Dr. Mare Ferrari earlier and it was feeling fine at that time and no further treatment was recommended. This evening it has been a little itchy - with scratching, now has dime-sized area of redness. No central clearing. No constitutional symptoms including fever, chills, joint pain or headache or anything else unusual. Thus far no signs of rickettsial illness. D/w MD on call who agrees antibiotics not likely indicated at this point unless she develops rash or constitutional symptoms. Local redness may be likely just a hypersensitivity reaction (common) but she was advised to monitor for further symptoms of tickborne illness. I passed this recommendation onto the patient who was not satisfied with this answer. I told her the best solution would be to seek care in person to an urgent care to have it assessed in person to determine if antibiotic therapy is necessary. She can also try calling primary care to discuss. She verbalized understanding.  Krystal Dacey PA-C

## 2015-02-28 NOTE — Progress Notes (Signed)
Cardiology Office Note   Date:  02/28/2015   ID:  [name], DOB 1940-12-06, MRN [id number]  PCP:  Gennette Pac, MD  Cardiologist: Darlin Coco MD  No chief complaint on file.     History of Present Illness: [name] is a 74 y.o. female who presents for a four-month follow-up office visit.  This pleasant 74 year old woman is seen for followup office visit. She has a past history of essential hypertension. 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 She has had previous atypical chest pain. She had a normal nuclear stress test in 2006.  She is being treated for multiple myeloma at [facility]. She initially was treated with oral Revlimid but after 3 weeks did not tolerate it because of side effects. She is now receiving intravenous chemotherapy intermittently through a Port-A-Cath in her right chest. She has been treated with steroids, off at present. Dr. Philipp Ovens from [facility] is directing her chemotherapy. She has been receiving her chemotherapy at [facility]. She is tolerating the chemotherapy well. She is having problems with insomnia.  She is trying melatonin.  Past Medical History  Diagnosis Date  . 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 History of blood clots     LEG  . Stroke   . Hernia   . MGUS (monoclonal gammopathy of unknown significance)   . Chronic kidney disease   . Hypertension   . Hyperlipidemia   . Thyroid disease   . Arthritis   . Mitral valve prolapse   . Anxiety   . Perforated bowel   . Peritonitis   . Diverticulosis     Pt reported on 03/15/12  . MGUS (monoclonal gammopathy of unknown significance)   . Cancer     THYROID, SKIN, NOSE, BONE  . Melanoma   . Melanoma 1989    chest    Past Surgical History  Procedure Laterality Date  . Abdominal hysterectomy    . Rotator cuff repair  2004    RIGHT SHOULDER  . Hernia  repair    . Leg surgery      VEIN & BLOOD CLOT  . Cardiovascular stress test  2006    NORMAL  . Transthoracic echocardiogram  2006  . Melanoma excision      R eye  . Skin cancer excision  2011, 2013    squamous cell of nose x 2  . Breast lumpectomy  12/2010    R breast  . Breast lumpectomy  2004    L axilla neg for cancer.     Current Outpatient Prescriptions  Medication Sig Dispense Refill  . acetaminophen (TYLENOL) 500 MG tablet Take 250 mg by mouth every 6 (six) hours as needed for moderate pain.    [name] albuterol (VENTOLIN HFA) 108 (90 BASE) MCG/ACT inhaler Inhale 2 puffs into the lungs every 6 (six) hours as needed for wheezing or shortness of breath.     . ALPRAZolam (XANAX) 0.5 MG tablet Take 0.5 mg by mouth daily as needed for anxiety.     [name] amLODipine (NORVASC) 5 MG tablet Take 1 tablet (5 mg total) by mouth daily. 90 tablet 3  . aspirin 81 MG chewable tablet Chew 81 mg by mouth at bedtime.     . clonazePAM (KLONOPIN) 0.5 MG tablet Take 0.25-0.5 mg by mouth at bedtime as needed for anxiety.     [name]  dextromethorphan (DELSYM) 30 MG/5ML liquid Take 60 mg by mouth every 12 (twelve) hours as needed for cough.     . fexofenadine (ALLEGRA) 180 MG tablet Take 90 mg by mouth daily.    . fluticasone (FLONASE) 50 MCG/ACT nasal spray Place 2 sprays into both nostrils 2 (two) times daily. 16 g 6  . levothyroxine (SYNTHROID, LEVOTHROID) 125 MCG tablet Take 125 mcg by mouth daily.      [name] MICARDIS 40 MG tablet Take 1 tablet (40 mg total) by mouth daily. 90 tablet 3  . mometasone (ASMANEX) 220 MCG/INH inhaler Inhale 2 puffs into the lungs daily as needed (shortness of breath).     . simvastatin (ZOCOR) 20 MG tablet Take 1 tablet (20 mg total) by mouth every evening. 90 tablet 0   No current facility-administered medications for this visit.    Allergies:   Decadrol; Doxycycline; Lipitor; Aldactone; Hydrocodone; Levofloxacin; Losartan; Prednisone; Pseudoephedrine; Quinolones; Sertraline;  Sertraline hcl; Sudafed; Trazodone and nefazodone; Ultram; Adhesive; Hydrocodone-acetaminophen; and Latex    Social History:  The patient  reports that she has never smoked. She has never used smokeless tobacco. She reports that she does not drink alcohol or use illicit drugs.   Family History:  The patient's family history includes Asthma in her father; Cancer in her brother, father, father, sister, and sister; Cervical cancer in her sister; Diabetes in her father and sister; Heart failure in her father; Stroke in her mother; Stroke (age of onset: 12) in her mother.    ROS:  Please see the history of present illness.   Otherwise, review of systems are positive for none.   All other systems are reviewed and negative.    PHYSICAL EXAM: VS:  BP 130/84 mmHg  Pulse 75  Ht _0  (1.549 m)  Wt 139 lb (63.05 kg)  BMI 26.28 kg/m2 , BMI Body mass index is 26.28 kg/(m^2). GEN: Well nourished, well developed, in no acute distress HEENT: normal Neck: no JVD, carotid bruits, or masses Cardiac: RRR; no murmurs, rubs, or gallops,no edema  Respiratory:  clear to auscultation bilaterally, normal work of breathing GI: soft, nontender, nondistended, + BS MS: no deformity or atrophy Skin: warm and dry, no rash Neuro:  Strength and sensation are intact Psych: euthymic mood, full affect   EKG:  EKG is not ordered today.    Recent Labs: 02/03/2015: ALT 13*; BUN 19; Creatinine 0.96; Hemoglobin 12.3; Platelets 314; Potassium 4.0; Sodium 135    Lipid Panel    Component Value Date/Time   CHOL 158 10/30/2014 0820   TRIG 138.0 10/30/2014 0820   HDL 48.00 10/30/2014 0820   CHOLHDL 3 10/30/2014 0820   VLDL 27.6 10/30/2014 0820   LDLCALC 82 10/30/2014 0820      Wt Readings from Last 3 Encounters:  02/28/15 139 lb (63.05 kg)  01/22/15 139 lb (63.05 kg)  10/30/14 140 lb 1.9 oz (63.558 kg)      Other studies Reviewed: Additional studies/ records that were reviewed today include: . Review of the  above records demonstrates:    ASSESSMENT AND PLAN:  1. essential hypertension without heart failure 2. Hypercholesterolemia 3. multiple myeloma 4. history of malignant melanoma 5. history of thyroid cancer   Current medicines are reviewed at length with the patient today.  The patient does not have concerns regarding medicines.  The following changes have been made:  no change  Labs/ tests ordered today include:  No orders of the defined types were placed in this encounter.  Disposition: Continue current medication.  Recheck in 4 months for office visit and fasting lipid panel hepatic function panel and basal metabolic panel  Signed, Darlin Coco MD 02/28/2015 8:29 AM    [address], [place], [place]  [address] Phone: [phone]; Fax: [phone]

## 2015-02-28 NOTE — Patient Instructions (Signed)
Medication Instructions:  Your physician recommends that you continue on your current medications as directed. Please refer to the Current Medication list given to you today.  Labwork: none  Testing/Procedures: none  Follow-Up: Your physician wants you to follow-up in: 4 months with fasting labs (lp/bmet/hfp)  You will receive a reminder letter in the mail two months in advance. If you don't receive a letter, please call our office to schedule the follow-up appointment.    

## 2015-03-03 NOTE — Progress Notes (Signed)
Quick Note:  Please report to patient. The recent labs are stable. Continue same medication and careful diet. ______ 

## 2015-03-04 ENCOUNTER — Telehealth: Payer: Self-pay | Admitting: Cardiology

## 2015-03-04 DIAGNOSIS — I119 Hypertensive heart disease without heart failure: Secondary | ICD-10-CM

## 2015-03-04 DIAGNOSIS — F411 Generalized anxiety disorder: Secondary | ICD-10-CM | POA: Diagnosis not present

## 2015-03-04 MED ORDER — AMLODIPINE BESYLATE 5 MG PO TABS: 5.0000 mg | ORAL_TABLET | Freq: Every day | ORAL | Status: DC

## 2015-03-04 NOTE — Addendum Note (Signed)
Addended by: Alvina Filbert B on: 03/04/2015 11:43 AM   Modules accepted: Orders

## 2015-03-04 NOTE — Telephone Encounter (Signed)
-----   Message from Darlin Coco, MD sent at 03/03/2015  2:39 PM EDT ----- Please report to patient.  The recent labs are stable. Continue same medication and careful diet.

## 2015-03-04 NOTE — Telephone Encounter (Signed)
Pt's cell not working so call [phone] rtn Melinda's call

## 2015-03-04 NOTE — Telephone Encounter (Signed)
Advised patient of lab results  

## 2015-03-05 DIAGNOSIS — M25572 Pain in left ankle and joints of left foot: Secondary | ICD-10-CM | POA: Diagnosis not present

## 2015-03-06 DIAGNOSIS — H0015 Chalazion left lower eyelid: Secondary | ICD-10-CM | POA: Diagnosis not present

## 2015-03-06 DIAGNOSIS — H524 Presbyopia: Secondary | ICD-10-CM | POA: Diagnosis not present

## 2015-03-06 DIAGNOSIS — H2512 Age-related nuclear cataract, left eye: Secondary | ICD-10-CM | POA: Diagnosis not present

## 2015-03-06 DIAGNOSIS — H10013 Acute follicular conjunctivitis, bilateral: Secondary | ICD-10-CM | POA: Diagnosis not present

## 2015-03-07 DIAGNOSIS — M25572 Pain in left ankle and joints of left foot: Secondary | ICD-10-CM | POA: Diagnosis not present

## 2015-03-10 DIAGNOSIS — Z6826 Body mass index (BMI) 26.0-26.9, adult: Secondary | ICD-10-CM | POA: Diagnosis not present

## 2015-03-10 DIAGNOSIS — C9000 Multiple myeloma not having achieved remission: Secondary | ICD-10-CM | POA: Diagnosis not present

## 2015-03-11 DIAGNOSIS — M25572 Pain in left ankle and joints of left foot: Secondary | ICD-10-CM | POA: Diagnosis not present

## 2015-03-13 DIAGNOSIS — H0015 Chalazion left lower eyelid: Secondary | ICD-10-CM | POA: Diagnosis not present

## 2015-03-17 DIAGNOSIS — C9000 Multiple myeloma not having achieved remission: Secondary | ICD-10-CM | POA: Diagnosis not present

## 2015-03-18 DIAGNOSIS — M25572 Pain in left ankle and joints of left foot: Secondary | ICD-10-CM | POA: Diagnosis not present

## 2015-03-20 DIAGNOSIS — M7672 Peroneal tendinitis, left leg: Secondary | ICD-10-CM | POA: Diagnosis not present

## 2015-03-20 DIAGNOSIS — M7752 Other enthesopathy of left foot: Secondary | ICD-10-CM | POA: Diagnosis not present

## 2015-03-20 DIAGNOSIS — M25572 Pain in left ankle and joints of left foot: Secondary | ICD-10-CM | POA: Diagnosis not present

## 2015-03-24 DIAGNOSIS — C9000 Multiple myeloma not having achieved remission: Secondary | ICD-10-CM | POA: Diagnosis not present

## 2015-04-04 DIAGNOSIS — H01005 Unspecified blepharitis left lower eyelid: Secondary | ICD-10-CM | POA: Diagnosis not present

## 2015-04-04 DIAGNOSIS — H04123 Dry eye syndrome of bilateral lacrimal glands: Secondary | ICD-10-CM | POA: Diagnosis not present

## 2015-04-04 DIAGNOSIS — H01004 Unspecified blepharitis left upper eyelid: Secondary | ICD-10-CM | POA: Diagnosis not present

## 2015-04-04 DIAGNOSIS — H10013 Acute follicular conjunctivitis, bilateral: Secondary | ICD-10-CM | POA: Diagnosis not present

## 2015-04-04 DIAGNOSIS — H0015 Chalazion left lower eyelid: Secondary | ICD-10-CM | POA: Diagnosis not present

## 2015-04-08 DIAGNOSIS — Z6826 Body mass index (BMI) 26.0-26.9, adult: Secondary | ICD-10-CM | POA: Diagnosis not present

## 2015-04-08 DIAGNOSIS — C9000 Multiple myeloma not having achieved remission: Secondary | ICD-10-CM | POA: Diagnosis not present

## 2015-04-10 DIAGNOSIS — L089 Local infection of the skin and subcutaneous tissue, unspecified: Secondary | ICD-10-CM | POA: Diagnosis not present

## 2015-04-10 DIAGNOSIS — L723 Sebaceous cyst: Secondary | ICD-10-CM | POA: Diagnosis not present

## 2015-04-16 DIAGNOSIS — L723 Sebaceous cyst: Secondary | ICD-10-CM | POA: Diagnosis not present

## 2015-04-16 DIAGNOSIS — F411 Generalized anxiety disorder: Secondary | ICD-10-CM | POA: Diagnosis not present

## 2015-04-16 DIAGNOSIS — L089 Local infection of the skin and subcutaneous tissue, unspecified: Secondary | ICD-10-CM | POA: Diagnosis not present

## 2015-04-17 DIAGNOSIS — H0015 Chalazion left lower eyelid: Secondary | ICD-10-CM | POA: Diagnosis not present

## 2015-04-22 DIAGNOSIS — Z6826 Body mass index (BMI) 26.0-26.9, adult: Secondary | ICD-10-CM | POA: Diagnosis not present

## 2015-04-22 DIAGNOSIS — C9000 Multiple myeloma not having achieved remission: Secondary | ICD-10-CM | POA: Diagnosis not present

## 2015-04-23 DIAGNOSIS — Z5111 Encounter for antineoplastic chemotherapy: Secondary | ICD-10-CM | POA: Diagnosis not present

## 2015-04-23 DIAGNOSIS — Z6826 Body mass index (BMI) 26.0-26.9, adult: Secondary | ICD-10-CM | POA: Diagnosis not present

## 2015-04-23 DIAGNOSIS — C9000 Multiple myeloma not having achieved remission: Secondary | ICD-10-CM | POA: Diagnosis not present

## 2015-04-29 DIAGNOSIS — L089 Local infection of the skin and subcutaneous tissue, unspecified: Secondary | ICD-10-CM | POA: Diagnosis not present

## 2015-04-29 DIAGNOSIS — C9000 Multiple myeloma not having achieved remission: Secondary | ICD-10-CM | POA: Diagnosis not present

## 2015-04-29 DIAGNOSIS — Z5111 Encounter for antineoplastic chemotherapy: Secondary | ICD-10-CM | POA: Diagnosis not present

## 2015-04-29 DIAGNOSIS — L723 Sebaceous cyst: Secondary | ICD-10-CM | POA: Diagnosis not present

## 2015-05-02 DIAGNOSIS — F411 Generalized anxiety disorder: Secondary | ICD-10-CM | POA: Diagnosis not present

## 2015-05-05 DIAGNOSIS — C9000 Multiple myeloma not having achieved remission: Secondary | ICD-10-CM | POA: Diagnosis not present

## 2015-05-05 DIAGNOSIS — Z5111 Encounter for antineoplastic chemotherapy: Secondary | ICD-10-CM | POA: Diagnosis not present

## 2015-05-08 DIAGNOSIS — H01002 Unspecified blepharitis right lower eyelid: Secondary | ICD-10-CM | POA: Diagnosis not present

## 2015-05-08 DIAGNOSIS — H01001 Unspecified blepharitis right upper eyelid: Secondary | ICD-10-CM | POA: Diagnosis not present

## 2015-05-08 DIAGNOSIS — H01005 Unspecified blepharitis left lower eyelid: Secondary | ICD-10-CM | POA: Diagnosis not present

## 2015-05-08 DIAGNOSIS — H01004 Unspecified blepharitis left upper eyelid: Secondary | ICD-10-CM | POA: Diagnosis not present

## 2015-05-08 DIAGNOSIS — H04123 Dry eye syndrome of bilateral lacrimal glands: Secondary | ICD-10-CM | POA: Diagnosis not present

## 2015-05-08 DIAGNOSIS — H524 Presbyopia: Secondary | ICD-10-CM | POA: Diagnosis not present

## 2015-05-13 ENCOUNTER — Other Ambulatory Visit: Payer: Self-pay | Admitting: Cardiology

## 2015-05-19 DIAGNOSIS — Z5111 Encounter for antineoplastic chemotherapy: Secondary | ICD-10-CM | POA: Diagnosis not present

## 2015-05-19 DIAGNOSIS — C9000 Multiple myeloma not having achieved remission: Secondary | ICD-10-CM | POA: Diagnosis not present

## 2015-05-21 DIAGNOSIS — C9000 Multiple myeloma not having achieved remission: Secondary | ICD-10-CM | POA: Diagnosis not present

## 2015-05-21 DIAGNOSIS — Z6826 Body mass index (BMI) 26.0-26.9, adult: Secondary | ICD-10-CM | POA: Diagnosis not present

## 2015-05-26 DIAGNOSIS — Z85820 Personal history of malignant melanoma of skin: Secondary | ICD-10-CM | POA: Diagnosis not present

## 2015-05-26 DIAGNOSIS — C9000 Multiple myeloma not having achieved remission: Secondary | ICD-10-CM | POA: Diagnosis not present

## 2015-05-26 DIAGNOSIS — Z5111 Encounter for antineoplastic chemotherapy: Secondary | ICD-10-CM | POA: Diagnosis not present

## 2015-06-04 ENCOUNTER — Telehealth: Payer: Self-pay | Admitting: Critical Care Medicine

## 2015-06-04 MED ORDER — FLUTICASONE PROPIONATE 50 MCG/ACT NA SUSP: 2.0000 | Freq: Two times a day (BID) | NASAL | Status: DC

## 2015-06-04 NOTE — Telephone Encounter (Signed)
Called spoke with pt. [name] needed refill sent on her flonase. I have done so. [name] has scheduled a visit with Dr. Ashok Cordia in October for follow up. Nothing further needed

## 2015-06-06 DIAGNOSIS — E039 Hypothyroidism, unspecified: Secondary | ICD-10-CM | POA: Diagnosis not present

## 2015-06-06 DIAGNOSIS — R319 Hematuria, unspecified: Secondary | ICD-10-CM | POA: Diagnosis not present

## 2015-06-06 DIAGNOSIS — I129 Hypertensive chronic kidney disease with stage 1 through stage 4 chronic kidney disease, or unspecified chronic kidney disease: Secondary | ICD-10-CM | POA: Diagnosis not present

## 2015-06-06 DIAGNOSIS — N182 Chronic kidney disease, stage 2 (mild): Secondary | ICD-10-CM | POA: Diagnosis not present

## 2015-06-10 DIAGNOSIS — Z885 Allergy status to narcotic agent status: Secondary | ICD-10-CM | POA: Diagnosis not present

## 2015-06-10 DIAGNOSIS — Z6826 Body mass index (BMI) 26.0-26.9, adult: Secondary | ICD-10-CM | POA: Diagnosis not present

## 2015-06-10 DIAGNOSIS — C9000 Multiple myeloma not having achieved remission: Secondary | ICD-10-CM | POA: Diagnosis not present

## 2015-06-10 DIAGNOSIS — Z7982 Long term (current) use of aspirin: Secondary | ICD-10-CM | POA: Diagnosis not present

## 2015-06-11 DIAGNOSIS — M7751 Other enthesopathy of right foot: Secondary | ICD-10-CM | POA: Diagnosis not present

## 2015-06-11 DIAGNOSIS — M25572 Pain in left ankle and joints of left foot: Secondary | ICD-10-CM | POA: Diagnosis not present

## 2015-06-11 DIAGNOSIS — M7752 Other enthesopathy of left foot: Secondary | ICD-10-CM | POA: Diagnosis not present

## 2015-06-11 DIAGNOSIS — M25571 Pain in right ankle and joints of right foot: Secondary | ICD-10-CM | POA: Diagnosis not present

## 2015-06-13 DIAGNOSIS — I129 Hypertensive chronic kidney disease with stage 1 through stage 4 chronic kidney disease, or unspecified chronic kidney disease: Secondary | ICD-10-CM | POA: Diagnosis not present

## 2015-06-16 DIAGNOSIS — C9000 Multiple myeloma not having achieved remission: Secondary | ICD-10-CM | POA: Diagnosis not present

## 2015-06-23 DIAGNOSIS — C9000 Multiple myeloma not having achieved remission: Secondary | ICD-10-CM | POA: Diagnosis not present

## 2015-06-23 DIAGNOSIS — Z79899 Other long term (current) drug therapy: Secondary | ICD-10-CM | POA: Diagnosis not present

## 2015-06-23 DIAGNOSIS — Z5111 Encounter for antineoplastic chemotherapy: Secondary | ICD-10-CM | POA: Diagnosis not present

## 2015-06-24 DIAGNOSIS — L723 Sebaceous cyst: Secondary | ICD-10-CM | POA: Diagnosis not present

## 2015-06-24 DIAGNOSIS — L089 Local infection of the skin and subcutaneous tissue, unspecified: Secondary | ICD-10-CM | POA: Diagnosis not present

## 2015-06-30 DIAGNOSIS — L723 Sebaceous cyst: Secondary | ICD-10-CM | POA: Diagnosis not present

## 2015-06-30 DIAGNOSIS — L089 Local infection of the skin and subcutaneous tissue, unspecified: Secondary | ICD-10-CM | POA: Diagnosis not present

## 2015-07-07 DIAGNOSIS — C9000 Multiple myeloma not having achieved remission: Secondary | ICD-10-CM | POA: Diagnosis not present

## 2015-07-07 DIAGNOSIS — Z885 Allergy status to narcotic agent status: Secondary | ICD-10-CM | POA: Diagnosis not present

## 2015-07-07 DIAGNOSIS — Z8673 Personal history of transient ischemic attack (TIA), and cerebral infarction without residual deficits: Secondary | ICD-10-CM | POA: Diagnosis not present

## 2015-07-07 DIAGNOSIS — Z7982 Long term (current) use of aspirin: Secondary | ICD-10-CM | POA: Diagnosis not present

## 2015-07-07 DIAGNOSIS — Z85820 Personal history of malignant melanoma of skin: Secondary | ICD-10-CM | POA: Diagnosis not present

## 2015-07-07 DIAGNOSIS — Z6826 Body mass index (BMI) 26.0-26.9, adult: Secondary | ICD-10-CM | POA: Diagnosis not present

## 2015-07-08 DIAGNOSIS — Z90710 Acquired absence of both cervix and uterus: Secondary | ICD-10-CM | POA: Diagnosis not present

## 2015-07-08 DIAGNOSIS — Z779 Other contact with and (suspected) exposures hazardous to health: Secondary | ICD-10-CM | POA: Diagnosis not present

## 2015-07-08 DIAGNOSIS — Z6826 Body mass index (BMI) 26.0-26.9, adult: Secondary | ICD-10-CM | POA: Diagnosis not present

## 2015-07-08 DIAGNOSIS — Z124 Encounter for screening for malignant neoplasm of cervix: Secondary | ICD-10-CM | POA: Diagnosis not present

## 2015-07-09 ENCOUNTER — Encounter: Payer: Self-pay | Admitting: Cardiology

## 2015-07-09 ENCOUNTER — Encounter: Payer: Self-pay | Admitting: Pulmonary Disease

## 2015-07-09 ENCOUNTER — Ambulatory Visit (INDEPENDENT_AMBULATORY_CARE_PROVIDER_SITE_OTHER): Payer: Medicare Other | Admitting: Pulmonary Disease

## 2015-07-09 ENCOUNTER — Ambulatory Visit (INDEPENDENT_AMBULATORY_CARE_PROVIDER_SITE_OTHER): Payer: Medicare Other | Admitting: Cardiology

## 2015-07-09 ENCOUNTER — Other Ambulatory Visit (INDEPENDENT_AMBULATORY_CARE_PROVIDER_SITE_OTHER): Payer: Medicare Other | Admitting: *Deleted

## 2015-07-09 VITALS — BP 106/56 | HR 71 | Ht 61.0 in | Wt 141.8 lb

## 2015-07-09 VITALS — BP 134/80 | HR 77 | Ht 60.5 in | Wt 138.0 lb

## 2015-07-09 DIAGNOSIS — E039 Hypothyroidism, unspecified: Secondary | ICD-10-CM

## 2015-07-09 DIAGNOSIS — I119 Hypertensive heart disease without heart failure: Secondary | ICD-10-CM

## 2015-07-09 DIAGNOSIS — E7800 Pure hypercholesterolemia, unspecified: Secondary | ICD-10-CM | POA: Diagnosis not present

## 2015-07-09 DIAGNOSIS — K219 Gastro-esophageal reflux disease without esophagitis: Secondary | ICD-10-CM

## 2015-07-09 DIAGNOSIS — J4540 Moderate persistent asthma, uncomplicated: Secondary | ICD-10-CM | POA: Diagnosis not present

## 2015-07-09 DIAGNOSIS — J4520 Mild intermittent asthma, uncomplicated: Secondary | ICD-10-CM

## 2015-07-09 DIAGNOSIS — J309 Allergic rhinitis, unspecified: Secondary | ICD-10-CM | POA: Diagnosis not present

## 2015-07-09 DIAGNOSIS — C9000 Multiple myeloma not having achieved remission: Secondary | ICD-10-CM

## 2015-07-09 LAB — BASIC METABOLIC PANEL
BUN: 19 mg/dL (ref 7–25)
CHLORIDE: 103 mmol/L (ref 98–110)
CO2: 28 mmol/L (ref 20–31)
Calcium: 9.6 mg/dL (ref 8.6–10.4)
Creat: 0.8 mg/dL (ref 0.60–0.93)
GLUCOSE: 93 mg/dL (ref 65–99)
POTASSIUM: 4 mmol/L (ref 3.5–5.3)
Sodium: 137 mmol/L (ref 135–146)

## 2015-07-09 LAB — LIPID PANEL
Cholesterol: 168 mg/dL (ref 125–200)
HDL: 47 mg/dL (ref >=46)
LDL CALC: 99 mg/dL (ref <130)
Total CHOL/HDL Ratio: 3.6 Ratio (ref <=5.0)
Triglycerides: 110 mg/dL (ref <150)
VLDL: 22 mg/dL (ref <30)

## 2015-07-09 LAB — HEPATIC FUNCTION PANEL
ALT: 11 U/L (ref 6–29)
AST: 15 U/L (ref 10–35)
Albumin: 4.2 g/dL (ref 3.6–5.1)
Alkaline Phosphatase: 43 U/L (ref 33–130)
BILIRUBIN DIRECT: 0.1 mg/dL (ref <=0.2)
BILIRUBIN INDIRECT: 0.7 mg/dL (ref 0.2–1.2)
Total Bilirubin: 0.8 mg/dL (ref 0.2–1.2)
Total Protein: 7.3 g/dL (ref 6.1–8.1)

## 2015-07-09 MED ORDER — MOMETASONE FUROATE 110 MCG/INH IN AEPB: 1.0000 | INHALATION_SPRAY | Freq: Every day | RESPIRATORY_TRACT | Status: DC

## 2015-07-09 MED ORDER — FLUTICASONE PROPIONATE 50 MCG/ACT NA SUSP: 1.0000 | Freq: Two times a day (BID) | NASAL | Status: DC

## 2015-07-09 NOTE — Patient Instructions (Signed)
1. Continue using her Piffard as prescribed. 2. Start using your Asmanex/mometasone sample inhaler 1 inhalation once daily if you start to notice any wheezing or breathing difficulties and notify my office. We have sent him a prescription for an inhaler to continue using the Asmanex once you start. 3. You can continue using your albuterol inhaler on an as-needed basis if you have any coughing, wheezing, or difficulty breathing. 4. Please call our office if you need to do so to get short influenza vaccine before the end of October. 5. I'm checking breathing tests on her before your next appointment to monitor your lung function. 6. Please contact my office if you have any questions or concerns before your next appointment otherwise I will see you in 6 months.

## 2015-07-09 NOTE — Progress Notes (Signed)
Cardiology Office Note   Date:  07/09/2015   ID:  [name], DOB 1941-07-30, MRN [id number]  PCP:  Gennette Pac, MD  Cardiologist: Darlin Coco MD  No chief complaint on file.     History of Present Illness: [name] is a 74 y.o. female who presents for scheduled four-month follow-up visit  . She has a past history of essential hypertension. 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 She has had previous atypical chest pain. She had a normal nuclear stress test in 2006.  She is being treated for multiple myeloma at [facility]. She initially was treated with oral Revlimid but after 3 weeks did not tolerate it because of side effects. She is now receiving intravenous chemotherapy intermittently through a Port-A-Cath in her right chest. She has been treated with steroids, off at present. Dr. Philipp Ovens from [facility] is directing her chemotherapy.Dr. Philipp Ovens however is now moving to [place] to do research.  Her oncologist at [facility] is Dr. Unk Pinto She has been receiving her chemotherapy at [facility]. She is tolerating the chemotherapy well. She is having problems with insomnia. She is trying melatonin.  Past Medical History  Diagnosis Date  . 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 History of blood clots     LEG  . Stroke ([place])   . Hernia   . MGUS (monoclonal gammopathy of unknown significance)   . Chronic kidney disease   . Hypertension   . Hyperlipidemia   . Thyroid disease   . Arthritis   . Mitral valve prolapse   . Anxiety   . Perforated bowel ([place])   . Peritonitis ([place])   . Diverticulosis     Pt reported on 03/15/12  . MGUS (monoclonal gammopathy of unknown significance)   . Cancer ([facility])     THYROID, SKIN, NOSE, BONE  . Melanoma ([place])   . Melanoma ([place]) 1989    chest  . Asthma     Past Surgical History    Procedure Laterality Date  . Abdominal hysterectomy    . Rotator cuff repair  2004    RIGHT SHOULDER  . Hernia repair    . Leg surgery      VEIN & BLOOD CLOT  . Cardiovascular stress test  2006    NORMAL  . Transthoracic echocardiogram  2006  . Melanoma excision      R eye  . Skin cancer excision  2011, 2013    squamous cell of nose x 2  . Breast lumpectomy  12/2010    R breast  . Breast lumpectomy  2004    L axilla neg for cancer.     Current Outpatient Prescriptions  Medication Sig Dispense Refill  . calcium carbonate (CALTRATE 600) 1500 (600 CA) MG TABS tablet Take 500 mg by mouth daily.    [name] acetaminophen (TYLENOL) 500 MG tablet Take 250 mg by mouth every 6 (six) hours as needed for moderate pain.    [name] albuterol (VENTOLIN HFA) 108 (90 BASE) MCG/ACT inhaler Inhale 2 puffs into the lungs every 6 (six) hours as needed for wheezing or shortness of breath.     . ALPRAZolam (XANAX) 0.5 MG tablet Take 0.5 mg by mouth daily as needed for anxiety.     [name] amLODipine (NORVASC) 5 MG tablet Take 1 tablet (5 mg total)  by mouth daily. 90 tablet 3  . aspirin 81 MG chewable tablet Chew 81 mg by mouth at bedtime.     . cephALEXin (KEFLEX) 500 MG capsule Take 1 capsule by mouth daily. For cysts from chemo    . clonazePAM (KLONOPIN) 0.5 MG tablet Take 0.25-0.5 mg by mouth at bedtime as needed for anxiety.     [name] dextromethorphan (DELSYM) 30 MG/5ML liquid Take 60 mg by mouth every 12 (twelve) hours as needed for cough.     . fexofenadine (ALLEGRA) 180 MG tablet Take 90 mg by mouth daily.    . fluticasone (FLONASE) 50 MCG/ACT nasal spray Place 1 spray into both nostrils 2 (two) times daily. 16 g 11  . levothyroxine (SYNTHROID, LEVOTHROID) 125 MCG tablet Take 125 mcg by mouth daily.      [name] MICARDIS 40 MG tablet Take 1 tablet (40 mg total) by mouth daily. 90 tablet 3  . mometasone (ASMANEX) 220 MCG/INH inhaler Inhale 2 puffs into the lungs daily as needed (shortness of breath).     . Mometasone  Furoate 110 MCG/INH AEPB Inhale 1 puff into the lungs daily. 1 Inhaler 0  . Olopatadine HCl 0.7 % SOLN Place 1 drop into the left eye 4 (four) times daily as needed.    . simvastatin (ZOCOR) 20 MG tablet TAKE ONE TABLET BY MOUTH IN THE EVENING 90 tablet 3  . valACYclovir (VALTREX) 500 MG tablet Take 1 tablet by mouth 2 (two) times daily.     No current facility-administered medications for this visit.    Allergies:   Decadrol; Doxycycline; Lipitor; Aldactone; Hydrocodone; Levofloxacin; Losartan; Prednisone; Pseudoephedrine; Quinolones; Sertraline; Sertraline hcl; Sudafed; Trazodone and nefazodone; Ultram; Adhesive; Hydrocodone-acetaminophen; and Latex    Social History:  The patient  reports that she has been passively smoking.  She has never used smokeless tobacco. She reports that she does not drink alcohol or use illicit drugs.   Family History:  The patient's family history includes Asthma in her father; Cancer in her brother, father, sister, and sister; Cervical cancer in her sister; Diabetes in her father and sister; Emphysema in her father; Heart failure in her father; Stroke (age of onset: 66) in her mother.    ROS:  Please see the history of present illness.   Otherwise, review of systems are positive for none.   All other systems are reviewed and negative.    PHYSICAL EXAM: VS:  BP 134/80 mmHg  Pulse 77  Ht 5' 0.5" (1.537 m)  Wt 138 lb (62.596 kg)  BMI 26.50 kg/m2 , BMI Body mass index is 26.5 kg/(m^2). GEN: Well nourished, well developed, in no acute distress HEENT: normal Neck: no JVD, carotid bruits, or masses Cardiac: RRR; no murmurs, rubs, or gallops,no edema  Respiratory:  clear to auscultation bilaterally, normal work of breathing GI: soft, nontender, nondistended, + BS MS: no deformity or atrophy Skin: warm and dry, no rash Neuro:  Strength and sensation are intact Psych: euthymic mood, full affect   EKG:  EKG is not ordered today.    Recent Labs: 02/03/2015:  Hemoglobin 12.3; Platelets 314 07/09/2015: ALT 11; BUN 19; Creat 0.80; Potassium 4.0; Sodium 137    Lipid Panel    Component Value Date/Time   CHOL 168 07/09/2015 0836   TRIG 110 07/09/2015 0836   HDL 47 07/09/2015 0836   CHOLHDL 3.6 07/09/2015 0836   VLDL 22 07/09/2015 0836   LDLCALC 99 07/09/2015 0836      Wt Readings from Last 3  Encounters:  07/09/15 141 lb 12.8 oz (64.32 kg)  07/09/15 138 lb (62.596 kg)  02/28/15 139 lb (63.05 kg)        ASSESSMENT AND PLAN:  1. essential hypertension without heart failure 2. Hypercholesterolemia 3. multiple myeloma 4. history of malignant melanoma 5. history of thyroid cancer   Current medicines are reviewed at length with the patient today.  The patient does not have concerns regarding medicines.  The following changes have been made:  no change  Labs/ tests ordered today include:  No orders of the defined types were placed in this encounter.    Disposition: Continue current medication.  Recheck in 4 months for office visit lipid panel hepatic function panel and basal metabolic panel.  Blood work from today is pending   Berna Spare MD 07/09/2015 6:19 PM    [address], [place], [place]  [address] Phone: [phone]; Fax: [phone]

## 2015-07-09 NOTE — Patient Instructions (Signed)
Medication Instructions:  Your physician recommends that you continue on your current medications as directed. Please refer to the Current Medication list given to you today.  Labwork: none  Testing/Procedures: none  Follow-Up: Your physician recommends that you schedule a follow-up appointment in: 4 months with fasting labs (lp/bmet/hfp) with Lori G NP or Scott W PA    
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
## 2015-07-10 DIAGNOSIS — H25042 Posterior subcapsular polar age-related cataract, left eye: Secondary | ICD-10-CM | POA: Diagnosis not present

## 2015-07-10 DIAGNOSIS — H524 Presbyopia: Secondary | ICD-10-CM | POA: Diagnosis not present

## 2015-07-10 NOTE — Progress Notes (Signed)
Quick Note:  Please report to patient. The recent labs are stable. Continue same medication and careful diet. ______ 

## 2015-07-14 DIAGNOSIS — C9000 Multiple myeloma not having achieved remission: Secondary | ICD-10-CM | POA: Diagnosis not present

## 2015-07-14 DIAGNOSIS — Z5112 Encounter for antineoplastic immunotherapy: Secondary | ICD-10-CM | POA: Diagnosis not present

## 2015-07-16 DIAGNOSIS — Z885 Allergy status to narcotic agent status: Secondary | ICD-10-CM | POA: Diagnosis not present

## 2015-07-16 DIAGNOSIS — I10 Essential (primary) hypertension: Secondary | ICD-10-CM | POA: Diagnosis not present

## 2015-07-16 DIAGNOSIS — C9000 Multiple myeloma not having achieved remission: Secondary | ICD-10-CM | POA: Diagnosis not present

## 2015-07-16 DIAGNOSIS — H539 Unspecified visual disturbance: Secondary | ICD-10-CM | POA: Diagnosis not present

## 2015-07-16 DIAGNOSIS — D472 Monoclonal gammopathy: Secondary | ICD-10-CM | POA: Diagnosis not present

## 2015-07-16 DIAGNOSIS — L989 Disorder of the skin and subcutaneous tissue, unspecified: Secondary | ICD-10-CM | POA: Diagnosis not present

## 2015-07-16 DIAGNOSIS — R600 Localized edema: Secondary | ICD-10-CM | POA: Diagnosis not present

## 2015-07-16 DIAGNOSIS — Z8673 Personal history of transient ischemic attack (TIA), and cerebral infarction without residual deficits: Secondary | ICD-10-CM | POA: Diagnosis not present

## 2015-07-16 DIAGNOSIS — M25473 Effusion, unspecified ankle: Secondary | ICD-10-CM | POA: Diagnosis not present

## 2015-07-16 DIAGNOSIS — M7582 Other shoulder lesions, left shoulder: Secondary | ICD-10-CM | POA: Diagnosis not present

## 2015-07-16 DIAGNOSIS — H547 Unspecified visual loss: Secondary | ICD-10-CM | POA: Diagnosis not present

## 2015-07-16 DIAGNOSIS — E039 Hypothyroidism, unspecified: Secondary | ICD-10-CM | POA: Diagnosis not present

## 2015-07-16 DIAGNOSIS — Z8614 Personal history of Methicillin resistant Staphylococcus aureus infection: Secondary | ICD-10-CM | POA: Diagnosis not present

## 2015-07-16 DIAGNOSIS — E7800 Pure hypercholesterolemia, unspecified: Secondary | ICD-10-CM | POA: Diagnosis not present

## 2015-07-16 DIAGNOSIS — R319 Hematuria, unspecified: Secondary | ICD-10-CM | POA: Diagnosis not present

## 2015-07-21 DIAGNOSIS — C9000 Multiple myeloma not having achieved remission: Secondary | ICD-10-CM | POA: Diagnosis not present

## 2015-07-21 DIAGNOSIS — Z79899 Other long term (current) drug therapy: Secondary | ICD-10-CM | POA: Diagnosis not present

## 2015-07-21 DIAGNOSIS — Z5111 Encounter for antineoplastic chemotherapy: Secondary | ICD-10-CM | POA: Diagnosis not present

## 2015-07-22 DIAGNOSIS — Z23 Encounter for immunization: Secondary | ICD-10-CM | POA: Diagnosis not present

## 2015-07-31 ENCOUNTER — Emergency Department (HOSPITAL_COMMUNITY)
Admission: EM | Admit: 2015-07-31 | Discharge: 2015-07-31 | Disposition: A | Discharge status: Home / Self Care | Payer: Medicare Other | Attending: Emergency Medicine | Admitting: Emergency Medicine

## 2015-07-31 ENCOUNTER — Encounter (HOSPITAL_COMMUNITY): Payer: Self-pay

## 2015-07-31 DIAGNOSIS — Z792 Long term (current) use of antibiotics: Secondary | ICD-10-CM | POA: Insufficient documentation

## 2015-07-31 DIAGNOSIS — E785 Hyperlipidemia, unspecified: Secondary | ICD-10-CM | POA: Insufficient documentation

## 2015-07-31 DIAGNOSIS — Z91040 Latex allergy status: Secondary | ICD-10-CM | POA: Insufficient documentation

## 2015-07-31 DIAGNOSIS — H579 Unspecified disorder of eye and adnexa: Secondary | ICD-10-CM | POA: Diagnosis not present

## 2015-07-31 DIAGNOSIS — F419 Anxiety disorder, unspecified: Secondary | ICD-10-CM | POA: Insufficient documentation

## 2015-07-31 DIAGNOSIS — E079 Disorder of thyroid, unspecified: Secondary | ICD-10-CM | POA: Diagnosis not present

## 2015-07-31 DIAGNOSIS — N907 Vulvar cyst: Secondary | ICD-10-CM | POA: Diagnosis not present

## 2015-07-31 DIAGNOSIS — N189 Chronic kidney disease, unspecified: Secondary | ICD-10-CM | POA: Diagnosis not present

## 2015-07-31 DIAGNOSIS — Z7951 Long term (current) use of inhaled steroids: Secondary | ICD-10-CM | POA: Diagnosis not present

## 2015-07-31 DIAGNOSIS — I129 Hypertensive chronic kidney disease with stage 1 through stage 4 chronic kidney disease, or unspecified chronic kidney disease: Secondary | ICD-10-CM | POA: Insufficient documentation

## 2015-07-31 DIAGNOSIS — Z85850 Personal history of malignant neoplasm of thyroid: Secondary | ICD-10-CM | POA: Insufficient documentation

## 2015-07-31 DIAGNOSIS — J45909 Unspecified asthma, uncomplicated: Secondary | ICD-10-CM | POA: Insufficient documentation

## 2015-07-31 DIAGNOSIS — M1990 Unspecified osteoarthritis, unspecified site: Secondary | ICD-10-CM | POA: Diagnosis not present

## 2015-07-31 DIAGNOSIS — Z85830 Personal history of malignant neoplasm of bone: Secondary | ICD-10-CM | POA: Diagnosis not present

## 2015-07-31 DIAGNOSIS — L02416 Cutaneous abscess of left lower limb: Secondary | ICD-10-CM | POA: Diagnosis not present

## 2015-07-31 DIAGNOSIS — Z8719 Personal history of other diseases of the digestive system: Secondary | ICD-10-CM | POA: Insufficient documentation

## 2015-07-31 DIAGNOSIS — Z85820 Personal history of malignant melanoma of skin: Secondary | ICD-10-CM | POA: Insufficient documentation

## 2015-07-31 DIAGNOSIS — L0291 Cutaneous abscess, unspecified: Secondary | ICD-10-CM

## 2015-07-31 DIAGNOSIS — Z8589 Personal history of malignant neoplasm of other organs and systems: Secondary | ICD-10-CM | POA: Insufficient documentation

## 2015-07-31 DIAGNOSIS — Z7982 Long term (current) use of aspirin: Secondary | ICD-10-CM | POA: Diagnosis not present

## 2015-07-31 DIAGNOSIS — Z85828 Personal history of other malignant neoplasm of skin: Secondary | ICD-10-CM | POA: Diagnosis not present

## 2015-07-31 DIAGNOSIS — Z86718 Personal history of other venous thrombosis and embolism: Secondary | ICD-10-CM | POA: Insufficient documentation

## 2015-07-31 DIAGNOSIS — R102 Pelvic and perineal pain: Secondary | ICD-10-CM | POA: Diagnosis present

## 2015-07-31 DIAGNOSIS — Z8673 Personal history of transient ischemic attack (TIA), and cerebral infarction without residual deficits: Secondary | ICD-10-CM | POA: Diagnosis not present

## 2015-07-31 LAB — I-STAT CHEM 8, ED
BUN: 24 mg/dL — ABNORMAL HIGH (ref 6–20)
Calcium, Ion: 1.16 mmol/L (ref 1.13–1.30)
Chloride: 104 mmol/L (ref 101–111)
Creatinine, Ser: 0.9 mg/dL (ref 0.44–1.00)
Glucose, Bld: 103 mg/dL — ABNORMAL HIGH (ref 65–99)
HCT: 37 % (ref 36.0–46.0)
HEMOGLOBIN: 12.6 g/dL (ref 12.0–15.0)
POTASSIUM: 4.1 mmol/L (ref 3.5–5.1)
SODIUM: 141 mmol/L (ref 135–145)
TCO2: 27 mmol/L (ref 0–100)

## 2015-07-31 MED ORDER — TRAMADOL HCL 50 MG PO TABS
50.0000 mg | ORAL_TABLET | Freq: Once | ORAL | Status: AC
  Administered 2015-07-31: 50 mg via ORAL
  Filled 2015-07-31: qty 1

## 2015-07-31 MED ORDER — CEPHALEXIN 500 MG PO CAPS: 500.0000 mg | ORAL_CAPSULE | Freq: Four times a day (QID) | ORAL | Status: DC

## 2015-07-31 MED ORDER — LIDOCAINE HCL (PF) 1 % IJ SOLN
5.0000 mL | Freq: Once | INTRAMUSCULAR | Status: AC
  Administered 2015-07-31: 5 mL
  Filled 2015-07-31: qty 5

## 2015-07-31 NOTE — ED Provider Notes (Signed)
CSN: [id number]     Arrival date & time 07/31/15  9563 History   First MD Initiated Contact with Patient 07/31/15 2004     Chief Complaint  Patient presents with  . Vaginal Pain     (Consider location/radiation/quality/duration/timing/severity/associated sxs/prior Treatment) HPI   DESERE GWIN is a 74 y.o. female, pt with history of cancer currently receiving chemo treatments, presents with complaint of cysts on her outer genitalia that came on a few days ago. 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 Pt usually gets her PCP, Dr. Rush Farmer, to lance the cysts, but she states that she is suppose to have eye surgery on Monday and wanted to get this cyst taken care of before then and Dr. Rush Farmer couldn't see her until after Monday. Pt adds that she is getting discouraged from all the cysts.     Past Medical History  Diagnosis Date  . 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 History of blood clots     LEG  . Stroke ([place])   . Hernia   . MGUS (monoclonal gammopathy of unknown significance)   . Chronic kidney disease   . Hypertension   . Hyperlipidemia   . Thyroid disease   . Arthritis   . Mitral valve prolapse   . Anxiety   . Perforated bowel ([place])   . Peritonitis ([place])   . Diverticulosis     Pt reported on 03/15/12  . MGUS (monoclonal gammopathy of unknown significance)   . Cancer (HCC)     THYROID, SKIN, NOSE, BONE  . Melanoma ([place])   . Melanoma ([place]) 1989    chest  . Asthma    Past Surgical History  Procedure Laterality Date  . Abdominal hysterectomy    . Rotator cuff repair  2004    RIGHT SHOULDER  . Hernia repair    . Leg surgery      VEIN & BLOOD CLOT  . Cardiovascular stress test  2006    NORMAL  . Transthoracic echocardiogram  2006  . Melanoma excision      R eye  . Skin cancer excision  2011, 2013    squamous cell of nose x 2  . Breast lumpectomy  12/2010    R breast   . Breast lumpectomy  2004    L axilla neg for cancer.   Family History  Problem Relation Age of Onset  . Asthma Father   . Diabetes Father   . Heart failure Father   . Cancer Father     Prostate and kidney cancer  . Emphysema Father   . Diabetes Sister   . Cancer Sister   . Stroke Mother 71    Her mother passed away from this stroke at the age of 83  . Cervical cancer Sister   . Cancer Sister     Recurrent cancer involving the neck and the jaw  . Cancer Brother     brain cancer & prostate cancer   Social History  Substance Use Topics  . Smoking status: Passive Smoke Exposure - Never Smoker  . Smokeless tobacco: Never Used     Comment: Exposure rarely through parents but significant exposure at work  . Alcohol Use: No   OB History    No data available     Review of Systems  Constitutional: Negative for fever, chills, diaphoresis and  unexpected weight change.  Respiratory: Negative for cough, chest tightness and shortness of breath.   Cardiovascular: Negative for chest pain, palpitations and leg swelling.  Gastrointestinal: Negative for nausea, vomiting, abdominal pain, diarrhea and constipation.  Genitourinary: Negative for dysuria and flank pain.  Musculoskeletal: Negative for back pain.  Skin: Negative for color change and pallor.       Vulval cyst  Neurological: Negative for dizziness, syncope, weakness and light-headedness.  All other systems reviewed and are negative.     Allergies  Decadrol; Doxycycline; Lipitor; Aldactone; Hydrocodone; Levofloxacin; Losartan; Prednisone; Pseudoephedrine; Quinolones; Sertraline; Sertraline hcl; Sudafed; Trazodone and nefazodone; Ultram; Adhesive; Hydrocodone-acetaminophen; and Latex  Home Medications   Prior to Admission medications   Medication Sig Start Date End Date Taking? Authorizing Provider  acetaminophen (TYLENOL) 500 MG tablet Take 250 mg by mouth every 6 (six) hours as needed for moderate pain.   Yes Historical  Provider, MD  amLODipine (NORVASC) 5 MG tablet Take 1 tablet (5 mg total) by mouth daily. 03/04/15  Yes Darlin Coco, MD  aspirin 81 MG chewable tablet Chew 81 mg by mouth at bedtime.  07/26/11  Yes Historical Provider, MD  calcium carbonate (CALTRATE 600) 1500 (600 CA) MG TABS tablet Take 500 mg by mouth daily. 07/26/11  Yes Historical Provider, MD  cephALEXin (KEFLEX) 500 MG capsule Take 1 capsule by mouth 3 (three) times daily. For cysts from chemo 04/10/15  Yes Historical Provider, MD  fexofenadine (ALLEGRA) 180 MG tablet Take 90 mg by mouth daily.   Yes Historical Provider, MD  fluticasone (FLONASE) 50 MCG/ACT nasal spray Place 1 spray into both nostrils 2 (two) times daily. 07/09/15  Yes Javier Glazier, MD  levothyroxine (SYNTHROID, LEVOTHROID) 125 MCG tablet Take 125 mcg by mouth daily.     Yes Historical Provider, MD  MICARDIS 40 MG tablet Take 1 tablet (40 mg total) by mouth daily. 11/27/12  Yes Darlin Coco, MD  Mometasone Furoate 110 MCG/INH AEPB Inhale 1 puff into the lungs daily. 07/09/15  Yes Javier Glazier, MD  simvastatin (ZOCOR) 20 MG tablet TAKE ONE TABLET BY MOUTH IN THE EVENING Patient taking differently: TAKE ONE TABLET BY MOUTH IN THE DAILY 05/14/15  Yes Darlin Coco, MD  valACYclovir (VALTREX) 500 MG tablet Take 1 tablet by mouth 2 (two) times daily. 06/23/15  Yes Historical Provider, MD  albuterol (VENTOLIN HFA) 108 (90 BASE) MCG/ACT inhaler Inhale 2 puffs into the lungs every 6 (six) hours as needed for wheezing or shortness of breath.     Historical Provider, MD  ALPRAZolam Duanne Moron) 0.5 MG tablet Take 0.5 mg by mouth daily as needed for anxiety.  07/26/11   Historical Provider, MD  clonazePAM (KLONOPIN) 0.5 MG tablet Take 0.25-0.5 mg by mouth at bedtime as needed for anxiety.  05/06/12   Historical Provider, MD  dextromethorphan (DELSYM) 30 MG/5ML liquid Take 60 mg by mouth every 12 (twelve) hours as needed for cough.     Historical Provider, MD  mometasone [facility])  220 MCG/INH inhaler Inhale 2 puffs into the lungs daily as needed (shortness of breath).     Historical Provider, MD  Olopatadine HCl 0.7 % SOLN Place 1 drop into the left eye 4 (four) times daily as needed.    Historical Provider, MD   BP 123/64 mmHg  Pulse 91  Temp(Src) 98.3 F (36.8 C) (Oral)  Resp 20  Ht 5\' 1"  (1.549 m)  Wt 138 lb (62.596 kg)  BMI 26.09 kg/m2  SpO2 96% Physical Exam  Constitutional: She appears well-developed and well-nourished. No distress.  HENT:  Head: Normocephalic and atraumatic.  Eyes: Conjunctivae are normal. Pupils are equal, round, and reactive to light.  Cardiovascular: Normal rate, regular rhythm and normal heart sounds.   Pulmonary/Chest: Effort normal and breath sounds normal. No respiratory distress.  Abdominal: Soft. Bowel sounds are normal.  Genitourinary:  Area of induration, erythema, and tenderness on medial left thigh just below inguinal fold. Internal exam not necessary as pt does not complain of vaginal pain.   Musculoskeletal: She exhibits no edema or tenderness.  Neurological: She is alert.  Skin: Skin is warm and dry. She is not diaphoretic.  Nursing note and vitals reviewed.   ED Course  .Marland KitchenIncision and Drainage Date/Time: 07/31/2015 8:52 PM Performed by: Lorayne Bender Authorized by: Arlean Hopping C Consent: Verbal consent obtained. Risks and benefits: risks, benefits and alternatives were discussed Consent given by: patient Patient understanding: patient states understanding of the procedure being performed Patient consent: the patient's understanding of the procedure matches consent given Procedure consent: procedure consent matches procedure scheduled Patient identity confirmed: verbally with patient and arm band Time out: Immediately prior to procedure a "time out" was called to verify the correct patient, procedure, equipment, support staff and site/side marked as required. Type: abscess Body area: lower extremity Location  details: left leg Anesthesia: local infiltration Local anesthetic: lidocaine 1% without epinephrine Anesthetic total: 2 ml Patient sedated: no Scalpel size: 11 Incision type: single straight Incision depth: subcutaneous Complexity: simple Drainage: serosanguinous Drainage amount: scant Wound treatment: wound left open Packing material: 1/4 in iodoform gauze Patient tolerance: Patient tolerated the procedure well with no immediate complications   (including critical care time) Labs Review Labs Reviewed  I-STAT CHEM 8, ED    Imaging Review No results found. I have personally reviewed and evaluated these images and lab results as part of my medical decision-making.   EKG Interpretation None      MDM   Final diagnoses:  Abscess    TARRYN BOGDAN presents with an abscess on her left medial thigh.  Findings and plan of care discussed with Milton Ferguson, MD.  Pt has area that justifies I&D on her left inner thigh. Exam performed with Dr. Roderic Palau and RN as chaperone. Plan on performing I&D with local lidocaine infiltration. Pt given tramadol PO for pain. Pt states that although her record says she is allergic to tramadol (headaches), as long as she only takes one she has no adverse effects. This seems like the best option given pt extensive allergy history. Pt agreed to tramadol administration. Pt will be discharged with new Keflex prescription. Pt was not taking her previous Keflex prescription as directed and stopped taking it when her cysts got better. It was explained to pt that she would need to take all her antibiotics even if she feels better. I&D as indicated above. Pt given verbal care instructions and also given a print out of the same. Pt states she understands all instructions.     Lorayne Bender, PA-C 07/31/15 2126  Milton Ferguson, MD 08/04/15 [phone]

## 2015-07-31 NOTE — ED Notes (Signed)
Pt is a cancer pt and had last chemo last Monday, she had a vaginal cyst come up this morning, she's suppose to have eye surgery on Monday.

## 2015-08-04 DIAGNOSIS — B9562 Methicillin resistant Staphylococcus aureus infection as the cause of diseases classified elsewhere: Secondary | ICD-10-CM | POA: Diagnosis not present

## 2015-08-04 DIAGNOSIS — L0291 Cutaneous abscess, unspecified: Secondary | ICD-10-CM | POA: Diagnosis not present

## 2015-08-04 DIAGNOSIS — Z6826 Body mass index (BMI) 26.0-26.9, adult: Secondary | ICD-10-CM | POA: Diagnosis not present

## 2015-08-04 DIAGNOSIS — C9000 Multiple myeloma not having achieved remission: Secondary | ICD-10-CM | POA: Diagnosis not present

## 2015-08-04 DIAGNOSIS — L02818 Cutaneous abscess of other sites: Secondary | ICD-10-CM | POA: Diagnosis not present

## 2015-08-07 ENCOUNTER — Telehealth: Payer: Self-pay

## 2015-08-07 NOTE — Telephone Encounter (Signed)
Had discussed with  Dr. Mare Ferrari and he recommended decreasing Amlodipine to 1/2 tablet daily  When called to advise patient she was agreeable to plan.  She then called back and left message that she was already taking 1/2 tablet  Spoke with patient and she thought Rx was for 0.5 mg stating it was already 1/2 tablet Explained to patient that it did not work like that and she actually is taking a 5 mg tablet it taking 1/2 tablet would take her to 2.5 mg daily Patient is agreeable to trying but will call back if not improvement

## 2015-08-07 NOTE — Telephone Encounter (Signed)
Patient called in requesting you to review her meds.  Her cancer doctor (Dr. Evelene Croon) in [facility] told her that her Amlodipine 5 mg is causing her ankles to swell.  She is very concerned and wants to talk to [place] about it.  She states that she "doesn't want to be a [place] pig" but would like help with the ankle swelling.

## 2015-08-07 NOTE — Telephone Encounter (Signed)
Agree with plan 

## 2015-08-07 NOTE — Telephone Encounter (Signed)
Spoke with patient and she has been having swelling for last several months Swelling does no go down at night Painful to walk and states she does not want to continue Amlodipine if this is side effect  Will forward to  Dr. Mare Ferrari for review

## 2015-08-07 NOTE — Telephone Encounter (Signed)
Patient called back saying that we don't know the seriousness of the swelling.  She cannot walk in her neighborhood for months due to the pain and swelling in her feet and ankles. ( She stated that her dose of Amlodipine was cut in half) She asked me to please pass this info on to [place].

## 2015-08-11 DIAGNOSIS — Z79899 Other long term (current) drug therapy: Secondary | ICD-10-CM | POA: Diagnosis not present

## 2015-08-11 DIAGNOSIS — Z5111 Encounter for antineoplastic chemotherapy: Secondary | ICD-10-CM | POA: Diagnosis not present

## 2015-08-11 DIAGNOSIS — C9000 Multiple myeloma not having achieved remission: Secondary | ICD-10-CM | POA: Diagnosis not present

## 2015-08-11 DIAGNOSIS — Z6825 Body mass index (BMI) 25.0-25.9, adult: Secondary | ICD-10-CM | POA: Diagnosis not present

## 2015-08-15 DIAGNOSIS — R300 Dysuria: Secondary | ICD-10-CM | POA: Diagnosis not present

## 2015-08-15 DIAGNOSIS — N390 Urinary tract infection, site not specified: Secondary | ICD-10-CM | POA: Diagnosis not present

## 2015-08-18 DIAGNOSIS — Z79899 Other long term (current) drug therapy: Secondary | ICD-10-CM | POA: Diagnosis not present

## 2015-08-18 DIAGNOSIS — C9000 Multiple myeloma not having achieved remission: Secondary | ICD-10-CM | POA: Diagnosis not present

## 2015-08-18 DIAGNOSIS — Z5111 Encounter for antineoplastic chemotherapy: Secondary | ICD-10-CM | POA: Diagnosis not present

## 2015-08-19 ENCOUNTER — Telehealth: Payer: Self-pay | Admitting: Cardiology

## 2015-08-19 DIAGNOSIS — L720 Epidermal cyst: Secondary | ICD-10-CM | POA: Diagnosis not present

## 2015-08-19 DIAGNOSIS — L0292 Furuncle, unspecified: Secondary | ICD-10-CM | POA: Diagnosis not present

## 2015-08-19 DIAGNOSIS — L820 Inflamed seborrheic keratosis: Secondary | ICD-10-CM | POA: Diagnosis not present

## 2015-08-19 DIAGNOSIS — C9000 Multiple myeloma not having achieved remission: Secondary | ICD-10-CM | POA: Diagnosis not present

## 2015-08-19 NOTE — Telephone Encounter (Signed)
New message     For [name] when she returns Pt states she is receiving a bill 39.00 for receiving records in 2014.  She pays all of her bills and she is not going to pay for something from 2014.  She is aware Dr Mare Ferrari is retiring but do not understand the bill.  Pt insist [name] gets this message. Pt is aware [name] is out today.  Pt did not want to speak to medical records---only [place].

## 2015-08-19 NOTE — Telephone Encounter (Signed)
Will route this message to Alvina Filbert LPN to follow-up on when she returns to the office, as the pt requested.

## 2015-08-20 NOTE — Telephone Encounter (Signed)
Spoke with patient and she stated she had been "turned over" for a bill from [employer] for 2014 services. Stated she had never received a bill from [employer] and it was originally for $10 now she owes $40 Explained to patient that this was for medical records and [facility]/ Dr. Mare Ferrari had nothing to do with that she would need to contact [facility]  Did give her Nicole's name and number [phone] to contact regarding this, information given to me by Maudie Mercury in medical records

## 2015-08-25 DIAGNOSIS — C9000 Multiple myeloma not having achieved remission: Secondary | ICD-10-CM | POA: Diagnosis not present

## 2015-08-25 DIAGNOSIS — Z85820 Personal history of malignant melanoma of skin: Secondary | ICD-10-CM | POA: Diagnosis not present

## 2015-08-26 ENCOUNTER — Encounter: Payer: Self-pay | Admitting: Podiatry

## 2015-08-26 ENCOUNTER — Ambulatory Visit (INDEPENDENT_AMBULATORY_CARE_PROVIDER_SITE_OTHER): Payer: Medicare Other | Admitting: Podiatry

## 2015-08-26 VITALS — BP 140/76 | HR 86 | Resp 12

## 2015-08-26 DIAGNOSIS — L600 Ingrowing nail: Secondary | ICD-10-CM

## 2015-08-26 NOTE — Patient Instructions (Signed)
Apply topical antibiotic ointment daily and cover with a Band-Aid on the right great toe until all discomfort resolves

## 2015-08-26 NOTE — Progress Notes (Signed)
   Subjective:    Patient ID: [name], female    DOB: 11-03-1940, 74 y.o.   MRN: [id number]  HPI    This patient presents today complaining of pain along the lateral aspect of the right hallux toenail for the past 2 weeks. Over time patient is visit pedicures and had the area trimmed out, however, the past 2 weeks the symptoms are more persistent. There is uncomfortable direct pressure and shoe wearing. Patient states that she has difficulty healing and water possible to merely debride the margin to obtain relief. Patient states she has difficulty healing.  Review of Systems  Constitutional: Positive for fatigue.  Eyes: Positive for pain, redness and visual disturbance.  Neurological: Positive for weakness.       Objective:   Physical Exam  Orientated 3  Vascular: No peripheral edema bilaterally DP and PT pulses 2/4 bilaterally Capillary reflex immediate bilaterally  Neurological: Sensation to 10 g monofilament wire intact 5/5 bilaterally Vibratory sensation intact bilaterally Ankle reflex equal and reactive bilaterally  Dermatological: Lateral margin right hallux toenails incurvated, without any erythema, edema, warmth or drainage. The margin is tender to direct palpation  Musculoskeletal: HAV deformities bilaterally Bunionette's bilaterally  There is no restriction ankle, subtalar, midtarsal joints bilaterally      Assessment & Plan:   Assessment: Satisfactory neurovascular status Ingrowing lateral margin of the right hallux toenail  Plan: Area prepped with alcohol and Betadine Lateral margin was debrided without any bleeding. There is no release of purulent drainage. The lateral margins demonstrates a low-grade erythema after nail debridement Antibiotic dressing and Band-Aid applied to the area. Patient advised to continue antibiotic dressings to the areas pain-free  Reappoint at patient's request

## 2015-09-01 ENCOUNTER — Encounter: Payer: Self-pay | Admitting: Podiatry

## 2015-09-01 ENCOUNTER — Ambulatory Visit (INDEPENDENT_AMBULATORY_CARE_PROVIDER_SITE_OTHER): Payer: Medicare Other | Admitting: Podiatry

## 2015-09-01 VITALS — BP 127/76 | HR 85 | Resp 18

## 2015-09-01 DIAGNOSIS — L600 Ingrowing nail: Secondary | ICD-10-CM | POA: Diagnosis not present

## 2015-09-01 DIAGNOSIS — H25042 Posterior subcapsular polar age-related cataract, left eye: Secondary | ICD-10-CM | POA: Diagnosis not present

## 2015-09-01 DIAGNOSIS — H31001 Unspecified chorioretinal scars, right eye: Secondary | ICD-10-CM | POA: Diagnosis not present

## 2015-09-01 DIAGNOSIS — H2512 Age-related nuclear cataract, left eye: Secondary | ICD-10-CM | POA: Diagnosis not present

## 2015-09-01 NOTE — Progress Notes (Signed)
Patient ID: [name], female   DOB: 1941-07-10, 74 y.o.   MRN: [id number]  Subjective: 74 year old female presents the office they for consents obtained of the right big toe. She last saw Dr. Amalia Hailey on November 22 and she had a portion of the lateral border of the right hallux toenail debrided. She states that after that appointment she is doing better. Over the weekend she states that she was very active raking leaves wearing tight shoes inserted have some pain. Due to her eyesight issues she was concerned that she may have an infection but she cannot see the toe. She is scheduled to have my surgery next week and she was essentially that there is no infection. No recent injury or trauma. No other complaints at this time.  Objective: AAO 3, NAD DP/PT pulses 2/4, CRT less than 3 seconds Protective sensation intact with Simms Weinstein monofilament Along the lateral margin the right hallux toenail there does appear to be some mild incurvation there is tenderness on the distal aspect the lateral border of the toenail. There is no pain on the proximal lateral nail border. After debridement there was resolution of symptoms. There is no swelling edema, erythema, drainage/purulence, ascending cellulitis, malodor. There is no clinical signs of infection. There is no other areas of tenderness to bilateral lower Achilles. There is no pain with calf compression, swelling, warmth, erythema. No open lesions or pre-ulcerative lesions.  Assessment: 74 year old female right lateral hallux ingrown toenail, without signs of infection at this time.  Plan: -Treatment options discussed including all alternatives, risks, and complications -Nails debrided of the nail border to remove the ingrowing portion of the toenail. After debridement there is no clinical signs of infection the purulence or drainage or bleeding was expressed. -Discussed that the symptoms continue Christoper Fabian to have a partial nail avulsion however  she wishes to hold off on that at this time. -Monitor for any clinical signs or symptoms of infection and directed to call the office immediately should any occur or go to the ER. -Follow-up as needed. Call if questions or concerns.  Celesta Gentile, DPM

## 2015-09-10 DIAGNOSIS — Z872 Personal history of diseases of the skin and subcutaneous tissue: Secondary | ICD-10-CM | POA: Diagnosis not present

## 2015-09-12 ENCOUNTER — Encounter: Payer: Self-pay | Admitting: Adult Health

## 2015-09-12 ENCOUNTER — Ambulatory Visit (INDEPENDENT_AMBULATORY_CARE_PROVIDER_SITE_OTHER): Payer: Medicare Other | Admitting: Adult Health

## 2015-09-12 ENCOUNTER — Telehealth: Payer: Self-pay | Admitting: Pulmonary Disease

## 2015-09-12 VITALS — BP 128/80 | HR 76 | Temp 97.8°F | Ht 61.0 in | Wt 139.0 lb

## 2015-09-12 DIAGNOSIS — J069 Acute upper respiratory infection, unspecified: Secondary | ICD-10-CM | POA: Insufficient documentation

## 2015-09-12 MED ORDER — AZITHROMYCIN 250 MG PO TABS: ORAL_TABLET | ORAL | Status: AC

## 2015-09-12 NOTE — Patient Instructions (Signed)
Zpack take as directed.  Saline nasal rinses As needed   Flonase 2 puffs daily  Do not use Hibiclens on face any more.  Please contact office for sooner follow up if symptoms do not improve or worsen or seek emergency care  Follow up Dr. Ashok Cordia 2-3 months and As needed

## 2015-09-12 NOTE — Telephone Encounter (Signed)
Pt's concerns were addressed by TP today at ov.  Nothing further needed.
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
## 2015-09-12 NOTE — Telephone Encounter (Signed)
Spoke with pt, c/o sinus congestion, facial swelling under L eye, scratchy throat, hoarseness since Wednesday morning.  Pt unsure if she has a fever.  Pt has been using flonase bid, 2 pills of doxycycline 100mg , zinc bid.   Pt having eye surgery Tuesday morning, is concerned she will have to push back sx d/t this infection.  Pt requesting to be seen. Offered appt with TP, pt initially declined but accepted an appt after realizing JN has no openings today.  Pt is scheduled at 11:45 but is still requesting JN's recs.   Pt uses [facility] on [address]   JN please advise.  Thanks.

## 2015-09-12 NOTE — Assessment & Plan Note (Signed)
URI vs early sinusitis   Plan  Zpack take as directed.  Saline nasal rinses As needed   Flonase 2 puffs daily  Do not use Hibiclens on face any more.  Please contact office for sooner follow up if symptoms do not improve or worsen or seek emergency care  Follow up Dr. Ashok Cordia 2-3 months and As needed

## 2015-09-16 ENCOUNTER — Telehealth: Payer: Self-pay | Admitting: Pulmonary Disease

## 2015-09-16 MED ORDER — AMOXICILLIN-POT CLAVULANATE 875-125 MG PO TABS: 1.0000 | ORAL_TABLET | Freq: Two times a day (BID) | ORAL | Status: DC

## 2015-09-16 NOTE — Telephone Encounter (Signed)
Spoke with pt. States that she is not better from seeing TP. TP gave her a zpack and it has not touched her symptoms. She kept saying "I need a real doctor" to address my needs. Pt is still having chest congestion, coughing and sinus drainage. Has upcoming eye surgery she needs to be better to have.  JN - please advise. Thanks.

## 2015-09-16 NOTE — Telephone Encounter (Signed)
Called spoke with pt. She is fine if we call in augmentin. Nothing further needed.

## 2015-09-16 NOTE — Telephone Encounter (Signed)
Caution the patient that this is likely a viral infection given the timing of her symptoms. Please send her in a prescription for Augmentin 875mg  po bid x5 days and if this does not improve after 48 hours her sinus & chest congestion she needs to be re-evaluated in either our office or with her PCP. Thanks.

## 2015-09-19 DIAGNOSIS — J320 Chronic maxillary sinusitis: Secondary | ICD-10-CM | POA: Diagnosis not present

## 2015-09-25 DIAGNOSIS — B373 Candidiasis of vulva and vagina: Secondary | ICD-10-CM | POA: Diagnosis not present

## 2015-09-25 DIAGNOSIS — R350 Frequency of micturition: Secondary | ICD-10-CM | POA: Diagnosis not present

## 2015-09-30 DIAGNOSIS — J019 Acute sinusitis, unspecified: Secondary | ICD-10-CM | POA: Diagnosis not present

## 2015-10-01 DIAGNOSIS — E890 Postprocedural hypothyroidism: Secondary | ICD-10-CM | POA: Diagnosis not present

## 2015-10-02 DIAGNOSIS — F411 Generalized anxiety disorder: Secondary | ICD-10-CM | POA: Diagnosis not present

## 2015-10-07 DIAGNOSIS — T1512XA Foreign body in conjunctival sac, left eye, initial encounter: Secondary | ICD-10-CM | POA: Diagnosis not present

## 2015-10-08 DIAGNOSIS — J310 Chronic rhinitis: Secondary | ICD-10-CM | POA: Diagnosis not present

## 2015-10-08 DIAGNOSIS — R0981 Nasal congestion: Secondary | ICD-10-CM | POA: Diagnosis not present

## 2015-10-10 DIAGNOSIS — E890 Postprocedural hypothyroidism: Secondary | ICD-10-CM | POA: Diagnosis not present

## 2015-10-10 DIAGNOSIS — C73 Malignant neoplasm of thyroid gland: Secondary | ICD-10-CM | POA: Diagnosis not present

## 2015-10-13 DIAGNOSIS — H18832 Recurrent erosion of cornea, left eye: Secondary | ICD-10-CM | POA: Diagnosis not present

## 2015-10-15 DIAGNOSIS — Z888 Allergy status to other drugs, medicaments and biological substances status: Secondary | ICD-10-CM | POA: Diagnosis not present

## 2015-10-15 DIAGNOSIS — J309 Allergic rhinitis, unspecified: Secondary | ICD-10-CM | POA: Diagnosis not present

## 2015-10-15 DIAGNOSIS — D472 Monoclonal gammopathy: Secondary | ICD-10-CM | POA: Diagnosis not present

## 2015-10-15 DIAGNOSIS — Z7982 Long term (current) use of aspirin: Secondary | ICD-10-CM | POA: Diagnosis not present

## 2015-10-15 DIAGNOSIS — H2512 Age-related nuclear cataract, left eye: Secondary | ICD-10-CM | POA: Diagnosis not present

## 2015-10-15 DIAGNOSIS — Z923 Personal history of irradiation: Secondary | ICD-10-CM | POA: Diagnosis not present

## 2015-10-15 DIAGNOSIS — Z961 Presence of intraocular lens: Secondary | ICD-10-CM | POA: Diagnosis not present

## 2015-10-15 DIAGNOSIS — Z886 Allergy status to analgesic agent status: Secondary | ICD-10-CM | POA: Diagnosis not present

## 2015-10-15 DIAGNOSIS — C6931 Malignant neoplasm of right choroid: Secondary | ICD-10-CM | POA: Diagnosis not present

## 2015-10-15 DIAGNOSIS — Z9221 Personal history of antineoplastic chemotherapy: Secondary | ICD-10-CM | POA: Diagnosis not present

## 2015-10-15 DIAGNOSIS — H35362 Drusen (degenerative) of macula, left eye: Secondary | ICD-10-CM | POA: Diagnosis not present

## 2015-10-15 DIAGNOSIS — Z881 Allergy status to other antibiotic agents status: Secondary | ICD-10-CM | POA: Diagnosis not present

## 2015-10-15 DIAGNOSIS — Z79899 Other long term (current) drug therapy: Secondary | ICD-10-CM | POA: Diagnosis not present

## 2015-10-15 DIAGNOSIS — Z85850 Personal history of malignant neoplasm of thyroid: Secondary | ICD-10-CM | POA: Diagnosis not present

## 2015-10-15 DIAGNOSIS — C9000 Multiple myeloma not having achieved remission: Secondary | ICD-10-CM | POA: Diagnosis not present

## 2015-10-15 DIAGNOSIS — H11241 Scarring of conjunctiva, right eye: Secondary | ICD-10-CM | POA: Diagnosis not present

## 2015-10-15 DIAGNOSIS — I10 Essential (primary) hypertension: Secondary | ICD-10-CM | POA: Diagnosis not present

## 2015-10-15 DIAGNOSIS — H47291 Other optic atrophy, right eye: Secondary | ICD-10-CM | POA: Diagnosis not present

## 2015-10-15 DIAGNOSIS — Z885 Allergy status to narcotic agent status: Secondary | ICD-10-CM | POA: Diagnosis not present

## 2015-10-15 DIAGNOSIS — Z87828 Personal history of other (healed) physical injury and trauma: Secondary | ICD-10-CM | POA: Diagnosis not present

## 2015-10-15 DIAGNOSIS — Z85840 Personal history of malignant neoplasm of eye: Secondary | ICD-10-CM | POA: Diagnosis not present

## 2015-10-15 DIAGNOSIS — H25042 Posterior subcapsular polar age-related cataract, left eye: Secondary | ICD-10-CM | POA: Diagnosis not present

## 2015-10-15 DIAGNOSIS — J019 Acute sinusitis, unspecified: Secondary | ICD-10-CM | POA: Diagnosis not present

## 2015-10-15 DIAGNOSIS — Z8673 Personal history of transient ischemic attack (TIA), and cerebral infarction without residual deficits: Secondary | ICD-10-CM | POA: Diagnosis not present

## 2015-10-17 DIAGNOSIS — H18832 Recurrent erosion of cornea, left eye: Secondary | ICD-10-CM | POA: Diagnosis not present

## 2015-10-21 DIAGNOSIS — B373 Candidiasis of vulva and vagina: Secondary | ICD-10-CM | POA: Diagnosis not present

## 2015-10-28 DIAGNOSIS — H25042 Posterior subcapsular polar age-related cataract, left eye: Secondary | ICD-10-CM | POA: Diagnosis not present

## 2015-10-28 DIAGNOSIS — H25812 Combined forms of age-related cataract, left eye: Secondary | ICD-10-CM | POA: Diagnosis not present

## 2015-10-28 DIAGNOSIS — H2512 Age-related nuclear cataract, left eye: Secondary | ICD-10-CM | POA: Diagnosis not present

## 2015-11-03 DIAGNOSIS — H02844 Edema of left upper eyelid: Secondary | ICD-10-CM | POA: Diagnosis not present

## 2015-11-18 ENCOUNTER — Other Ambulatory Visit: Payer: Medicare Other

## 2015-11-18 ENCOUNTER — Ambulatory Visit: Payer: Medicare Other | Admitting: Nurse Practitioner

## 2015-11-20 DIAGNOSIS — B373 Candidiasis of vulva and vagina: Secondary | ICD-10-CM | POA: Diagnosis not present

## 2015-11-20 DIAGNOSIS — H02844 Edema of left upper eyelid: Secondary | ICD-10-CM | POA: Diagnosis not present

## 2015-11-28 DIAGNOSIS — N762 Acute vulvitis: Secondary | ICD-10-CM | POA: Diagnosis not present

## 2015-12-01 DIAGNOSIS — C9001 Multiple myeloma in remission: Secondary | ICD-10-CM | POA: Diagnosis not present

## 2015-12-01 DIAGNOSIS — R300 Dysuria: Secondary | ICD-10-CM | POA: Diagnosis not present

## 2015-12-01 DIAGNOSIS — R600 Localized edema: Secondary | ICD-10-CM | POA: Diagnosis not present

## 2015-12-01 DIAGNOSIS — C9000 Multiple myeloma not having achieved remission: Secondary | ICD-10-CM | POA: Diagnosis not present

## 2015-12-01 DIAGNOSIS — Z6825 Body mass index (BMI) 25.0-25.9, adult: Secondary | ICD-10-CM | POA: Diagnosis not present

## 2015-12-01 DIAGNOSIS — R072 Precordial pain: Secondary | ICD-10-CM | POA: Diagnosis not present

## 2015-12-02 ENCOUNTER — Encounter: Payer: Self-pay | Admitting: Pulmonary Disease

## 2015-12-02 ENCOUNTER — Ambulatory Visit (INDEPENDENT_AMBULATORY_CARE_PROVIDER_SITE_OTHER): Payer: Medicare Other | Admitting: Pulmonary Disease

## 2015-12-02 VITALS — BP 124/68 | HR 83 | Ht 61.0 in | Wt 142.6 lb

## 2015-12-02 DIAGNOSIS — K219 Gastro-esophageal reflux disease without esophagitis: Secondary | ICD-10-CM

## 2015-12-02 DIAGNOSIS — R319 Hematuria, unspecified: Secondary | ICD-10-CM | POA: Diagnosis not present

## 2015-12-02 DIAGNOSIS — C73 Malignant neoplasm of thyroid gland: Secondary | ICD-10-CM | POA: Diagnosis not present

## 2015-12-02 DIAGNOSIS — N182 Chronic kidney disease, stage 2 (mild): Secondary | ICD-10-CM | POA: Diagnosis not present

## 2015-12-02 DIAGNOSIS — E039 Hypothyroidism, unspecified: Secondary | ICD-10-CM | POA: Diagnosis not present

## 2015-12-02 DIAGNOSIS — J4520 Mild intermittent asthma, uncomplicated: Secondary | ICD-10-CM

## 2015-12-02 DIAGNOSIS — E785 Hyperlipidemia, unspecified: Secondary | ICD-10-CM | POA: Diagnosis not present

## 2015-12-02 DIAGNOSIS — I639 Cerebral infarction, unspecified: Secondary | ICD-10-CM | POA: Diagnosis not present

## 2015-12-02 DIAGNOSIS — C9000 Multiple myeloma not having achieved remission: Secondary | ICD-10-CM | POA: Diagnosis not present

## 2015-12-02 DIAGNOSIS — J309 Allergic rhinitis, unspecified: Secondary | ICD-10-CM

## 2015-12-02 DIAGNOSIS — I8393 Asymptomatic varicose veins of bilateral lower extremities: Secondary | ICD-10-CM | POA: Diagnosis not present

## 2015-12-02 DIAGNOSIS — C6990 Malignant neoplasm of unspecified site of unspecified eye: Secondary | ICD-10-CM | POA: Diagnosis not present

## 2015-12-02 DIAGNOSIS — I129 Hypertensive chronic kidney disease with stage 1 through stage 4 chronic kidney disease, or unspecified chronic kidney disease: Secondary | ICD-10-CM | POA: Diagnosis not present

## 2015-12-02 NOTE — Patient Instructions (Signed)
   Continue using your medications as prescribed  Check with your cancer doctor at [facility] at your next appointment to see if they have given you the Prevnar Vaccine. I would recommend you get this either at their office or mine if not.  Call if you have any new breathing problems before your next appointment.  We will do a breathing test at your next appointment.  I will see you back in 3 months or sooner if needed.  TESTS ORDERED:  1. Full pulmonary function testing at next appointment
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
## 2015-12-10 ENCOUNTER — Other Ambulatory Visit: Payer: Medicare Other | Admitting: *Deleted

## 2015-12-10 ENCOUNTER — Ambulatory Visit (INDEPENDENT_AMBULATORY_CARE_PROVIDER_SITE_OTHER): Payer: Medicare Other | Admitting: Nurse Practitioner

## 2015-12-10 ENCOUNTER — Encounter: Payer: Self-pay | Admitting: Nurse Practitioner

## 2015-12-10 VITALS — BP 130/82 | HR 68 | Ht 61.0 in | Wt 139.1 lb

## 2015-12-10 DIAGNOSIS — Z6826 Body mass index (BMI) 26.0-26.9, adult: Secondary | ICD-10-CM | POA: Diagnosis not present

## 2015-12-10 DIAGNOSIS — E7800 Pure hypercholesterolemia, unspecified: Secondary | ICD-10-CM

## 2015-12-10 DIAGNOSIS — C9000 Multiple myeloma not having achieved remission: Secondary | ICD-10-CM | POA: Diagnosis not present

## 2015-12-10 DIAGNOSIS — I119 Hypertensive heart disease without heart failure: Secondary | ICD-10-CM | POA: Diagnosis not present

## 2015-12-10 DIAGNOSIS — S39013A Strain of muscle, fascia and tendon of pelvis, initial encounter: Secondary | ICD-10-CM | POA: Diagnosis not present

## 2015-12-10 DIAGNOSIS — N390 Urinary tract infection, site not specified: Secondary | ICD-10-CM | POA: Diagnosis not present

## 2015-12-10 LAB — LIPID PANEL
Cholesterol: 233 mg/dL — ABNORMAL HIGH (ref 125–200)
HDL: 49 mg/dL (ref >=46)
LDL Cholesterol: 156 mg/dL — ABNORMAL HIGH (ref <130)
Total CHOL/HDL Ratio: 4.8 Ratio (ref <=5.0)
Triglycerides: 141 mg/dL (ref <150)
VLDL: 28 mg/dL (ref <30)

## 2015-12-10 LAB — HEPATIC FUNCTION PANEL
ALT: 13 U/L (ref 6–29)
AST: 15 U/L (ref 10–35)
Albumin: 4.1 g/dL (ref 3.6–5.1)
Alkaline Phosphatase: 41 U/L (ref 33–130)
Bilirubin, Direct: 0.1 mg/dL (ref <=0.2)
Indirect Bilirubin: 0.7 mg/dL (ref 0.2–1.2)
Total Bilirubin: 0.8 mg/dL (ref 0.2–1.2)
Total Protein: 7.1 g/dL (ref 6.1–8.1)

## 2015-12-10 LAB — BASIC METABOLIC PANEL
BUN: 9 mg/dL (ref 7–25)
CO2: 27 mmol/L (ref 20–31)
Calcium: 9.6 mg/dL (ref 8.6–10.4)
Chloride: 105 mmol/L (ref 98–110)
Creat: 0.62 mg/dL (ref 0.60–0.93)
Glucose, Bld: 85 mg/dL (ref 65–99)
Potassium: 3.9 mmol/L (ref 3.5–5.3)
Sodium: 142 mmol/L (ref 135–146)

## 2015-12-10 MED ORDER — SIMVASTATIN 20 MG PO TABS: 20.0000 mg | ORAL_TABLET | Freq: Every evening | ORAL | Status: DC

## 2015-12-10 NOTE — Progress Notes (Signed)
CARDIOLOGY OFFICE NOTE  Date:  12/10/2015    [name] Date of Birth: 1941-03-24 Medical Record #[id number]  PCP:  Gennette Pac, MD  Cardiologist:  Former patient of Dr. Sherryl Barters.     Chief Complaint  Patient presents with  . Hyperlipidemia  . Hypertension    5 month check - former patient of Dr. Sherryl Barters    History of Present Illness: [name] is a 75 y.o. female who presents today for a 5 month check. Former patient of Dr. Mare Ferrari.   She has a history of HTN, prior stroke, CKD, MGUS, HTN, HLD, thyroid disease, MVP, and several past cancers (melanoma of the right eye, thyroid cancer and multiple myeloma).   Last seen here in October - felt to be doing ok.   Comes back today. Here alone. She is followed in multiple locations for her cancer. Was at [facility] last week and had an EKG noted in [place]. She has had some sharp fleeting chest pain but nothing exertional related. Not short of breath. Rare swelling in her feet - does not use support stockings. More upset about a fungal infection that has been very difficult to treat - seeing GYN again later today. Asking for my opinion.    Past Medical History  Diagnosis Date  . 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 History of blood clots     LEG  . Stroke ([place])   . Hernia   . MGUS (monoclonal gammopathy of unknown significance)   . Chronic kidney disease   . Hypertension   . Hyperlipidemia   . Thyroid disease   . Arthritis   . Mitral valve prolapse   . Anxiety   . Perforated bowel ([place])   . Peritonitis ([place])   . Diverticulosis     Pt reported on 03/15/12  . MGUS (monoclonal gammopathy of unknown significance)   . Cancer (HCC)     THYROID, SKIN, NOSE, BONE  . Melanoma ([place])   . Melanoma ([place]) 1989    chest  . Asthma     Past Surgical History  Procedure Laterality Date  . Abdominal hysterectomy    . Rotator cuff repair  2004    RIGHT SHOULDER  . Hernia repair    . Leg surgery      VEIN & BLOOD CLOT  . Cardiovascular stress test  2006    NORMAL  . Transthoracic echocardiogram  2006  . Melanoma excision      R eye  . Skin cancer excision  2011, 2013    squamous cell of nose x 2  . Breast lumpectomy  12/2010    R breast  . Breast lumpectomy  2004    L axilla neg for cancer.     Medications: Current Outpatient Prescriptions  Medication Sig Dispense Refill  . acetaminophen (TYLENOL) 500 MG tablet Take 250 mg by mouth every 6 (six) hours as needed for moderate pain.    [name] albuterol (VENTOLIN HFA) 108 (90 BASE) MCG/ACT inhaler Inhale 2 puffs into the lungs every 6 (six) hours as needed for wheezing or shortness of breath.     . ALPRAZolam (XANAX) 0.5 MG tablet Take 0.5 mg by mouth daily as needed for anxiety.     [name] amLODipine (NORVASC) 5 MG tablet Take 5 mg by mouth as directed.     [name] aspirin 81 MG chewable tablet Chew 81 mg by mouth at bedtime.     [name]  calcium carbonate (CALTRATE 600) 1500 (600 CA) MG TABS tablet Take 500 mg by mouth every other day.     . clonazePAM (KLONOPIN) 0.5 MG tablet Take 0.25-0.5 mg by mouth at bedtime as needed for anxiety.     [name] dextromethorphan (DELSYM) 30 MG/5ML liquid Take 60 mg by mouth every 12 (twelve) hours as needed for cough.     . fexofenadine (ALLEGRA) 180 MG tablet Take 90 mg by mouth daily.    . fluticasone (FLONASE) 50 MCG/ACT nasal spray Place 1 spray into both nostrils 2 (two) times daily. 16 g 11  . levothyroxine (SYNTHROID, LEVOTHROID) 112 MCG tablet Take 112 mcg by mouth daily before breakfast. Once daily    . MICARDIS 40 MG tablet Take 1 tablet (40 mg total) by mouth daily. 90 tablet 3  . mometasone (ASMANEX) 220 MCG/INH inhaler Inhale 2 puffs into the lungs daily as needed (shortness of breath).     . nystatin ointment (MYCOSTATIN) Apply 1 application topically 2 (two) times daily. As directed    . Olopatadine HCl 0.7 % SOLN Place 1 drop into the left eye daily. Reported on 12/02/2015    . valACYclovir (VALTREX) 500 MG  tablet Take 1 tablet by mouth daily.     . simvastatin (ZOCOR) 20 MG tablet Take 1 tablet (20 mg total) by mouth every evening. 90 tablet 3   No current facility-administered medications for this visit.    Allergies: Allergies  Allergen Reactions  . Decadrol [Dexamethasone] Anaphylaxis  . Doxycycline     SEVERE HEADACHES  . Lipitor [Atorvastatin] Other (See Comments)    Patient stated legs hurt so bad she could hardly walk   . Aldactone [Spironolactone]     Burning in stomach Burning in stomach  . Hydrocodone   . Levofloxacin Other (See Comments)    Severe Myalgias  . Losartan Swelling  . Prednisone   . Pseudoephedrine     stroke  . Quinolones   . Sertraline   . Sertraline Hcl   . Sudafed [Pseudoephedrine Hcl]   . Trazodone And Nefazodone     Side affects  . Ultram [Tramadol]     Headaches  Headaches   . Adhesive [Tape] Rash  . Hydrocodone-Acetaminophen Anxiety  . Latex Rash    Social History: The patient  reports that she has been passively smoking.  She has never used smokeless tobacco. She reports that she does not drink alcohol or use illicit drugs.   Family History: The patient's family history includes Asthma in her father; Cancer in her brother, father, sister, and sister; Cervical cancer in her sister; Diabetes in her father and sister; Emphysema in her father; Heart failure in her father; Stroke (age of onset: 21) in her mother.   Review of Systems: Please see the history of present illness.   Otherwise, the review of systems is positive for none.   All other systems are reviewed and negative.   Physical Exam: VS:  BP 130/82 mmHg  Pulse 68  Ht 5' 1"  (1.549 m)  Wt 139 lb 1.9 oz (63.104 kg)  BMI 26.30 kg/m2 .  BMI Body mass index is 26.3 kg/(m^2).  Wt Readings from Last 3 Encounters:  12/10/15 139 lb 1.9 oz (63.104 kg)  12/02/15 142 lb 9.6 oz (64.683 kg)  09/12/15 139 lb (63.05 kg)    General: Pleasant. She is alert and in no acute distress.  HEENT:  Normal. Neck: Supple, no JVD, carotid bruits, or masses noted.  Cardiac: Regular rate  and rhythm. No murmurs, rubs, or gallops. No edema.  Respiratory:  Lungs are clear to auscultation bilaterally with normal work of breathing.  GI: Soft and nontender.  MS: No deformity or atrophy. Gait and ROM intact. Skin: Warm and dry. Color is normal.  Neuro:  Strength and sensation are intact and no gross focal deficits noted.  Psych: Alert, appropriate and with normal affect.   LABORATORY DATA:  EKG:  EKG is not ordered today.   Lab Results  Component Value Date   WBC 7.2 02/03/2015   HGB 12.6 07/31/2015   HCT 37.0 07/31/2015   PLT 314 02/03/2015   GLUCOSE 103* 07/31/2015   CHOL 168 07/09/2015   TRIG 110 07/09/2015   HDL 47 07/09/2015   LDLCALC 99 07/09/2015   ALT 11 07/09/2015   AST 15 07/09/2015   NA 141 07/31/2015   K 4.1 07/31/2015   CL 104 07/31/2015   CREATININE 0.90 07/31/2015   BUN 24* 07/31/2015   CO2 28 07/09/2015    BNP (last 3 results) No results for input(s): BNP in the last 8760 hours.  ProBNP (last 3 results) No results for input(s): PROBNP in the last 8760 hours.   Other Studies Reviewed Today:   Assessment/Plan: 1. HTN - BP ok on current regimen.   2. HLD - on statin - she has not tolerated Lipitor in the past - I would keep her on her current regimen.   3. Fungal infection - may need to see dermatology.  Current medicines are reviewed with the patient today.  The patient does not have concerns regarding medicines other than what has been noted above.  The following changes have been made:  See above.  Labs/ tests ordered today include:    Orders Placed This Encounter  Procedures  . Basic metabolic panel  . Hepatic function panel  . Lipid panel     Disposition:   FU with me in 6 months.   Patient is agreeable to this plan and will call if any problems develop in the interim.   Signed: Burtis Junes, RN, ANP-C 12/10/2015 8:22 AM  [facility] [address] [place], [place]  [address] Phone: [phone] Fax: [phone]

## 2015-12-10 NOTE — Patient Instructions (Addendum)
We will be checking the following labs today - BMET, HPF and Lipids   Medication Instructions:    Continue with your current medicines.   I have refilled your Zocor today    Testing/Procedures To Be Arranged:  N/A  Follow-Up:   See me in 6 months    Other Special Instructions:   N/A    If you need a refill on your cardiac medications before your next appointment, please call your pharmacy.   Call the [place] office at [phone] if you have any questions, problems or concerns.

## 2015-12-10 NOTE — Progress Notes (Deleted)
CARDIOLOGY OFFICE NOTE  Date:  12/10/2015    [name] Date of Birth: 04-02-1941 Medical Record [id number]  PCP:  Gennette Pac, MD  Cardiologist:  ***    No chief complaint on file.   History of Present Illness: [name] is a 75 y.o. female who presents today for a ***   Past Medical History  Diagnosis Date  . 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 History of blood clots     LEG  . Stroke ([place])   . Hernia   . MGUS (monoclonal gammopathy of unknown significance)   . Chronic kidney disease   . Hypertension   . Hyperlipidemia   . Thyroid disease   . Arthritis   . Mitral valve prolapse   . Anxiety   . Perforated bowel ([place])   . Peritonitis ([place])   . Diverticulosis     Pt reported on 03/15/12  . MGUS (monoclonal gammopathy of unknown significance)   . Cancer (HCC)     THYROID, SKIN, NOSE, BONE  . Melanoma ([place])   . Melanoma ([place]) 1989    chest  . Asthma     Past Surgical History  Procedure Laterality Date  . Abdominal hysterectomy    . Rotator cuff repair  2004    RIGHT SHOULDER  . Hernia repair    . Leg surgery      VEIN & BLOOD CLOT  . Cardiovascular stress test  2006    NORMAL  . Transthoracic echocardiogram  2006  . Melanoma excision      R eye  . Skin cancer excision  2011, 2013    squamous cell of nose x 2  . Breast lumpectomy  12/2010    R breast  . Breast lumpectomy  2004    L axilla neg for cancer.     Medications: Current Outpatient Prescriptions  Medication Sig Dispense Refill  . acetaminophen (TYLENOL) 500 MG tablet Take 250 mg by mouth every 6 (six) hours as needed for moderate pain.    [name] albuterol (VENTOLIN HFA) 108 (90 BASE) MCG/ACT inhaler Inhale 2 puffs into the lungs every 6 (six) hours as needed for wheezing or shortness of breath.     . ALPRAZolam (XANAX) 0.5 MG tablet Take 0.5 mg by mouth daily as needed for anxiety.     [name] amLODipine (NORVASC) 5 MG tablet Take 5 mg by mouth as directed.     [name] aspirin 81  MG chewable tablet Chew 81 mg by mouth at bedtime.     . calcium carbonate (CALTRATE 600) 1500 (600 CA) MG TABS tablet Take 500 mg by mouth daily.    . clonazePAM (KLONOPIN) 0.5 MG tablet Take 0.25-0.5 mg by mouth at bedtime as needed for anxiety.     [name] dextromethorphan (DELSYM) 30 MG/5ML liquid Take 60 mg by mouth every 12 (twelve) hours as needed for cough.     . fexofenadine (ALLEGRA) 180 MG tablet Take 90 mg by mouth daily.    . fluconazole (DIFLUCAN) 100 MG tablet once daily    . fluticasone (FLONASE) 50 MCG/ACT nasal spray Place 1 spray into both nostrils 2 (two) times daily. 16 g 11  . levothyroxine (SYNTHROID, LEVOTHROID) 112 MCG tablet Once daily    . MICARDIS 40 MG tablet Take 1 tablet (40 mg total) by mouth daily. 90 tablet 3  . mometasone (ASMANEX) 220 MCG/INH inhaler Inhale 2 puffs into the lungs daily as needed (  shortness of breath).     . nystatin ointment (MYCOSTATIN) As directed    . Olopatadine HCl 0.7 % SOLN Place 1 drop into the left eye daily. Reported on 12/02/2015    . simvastatin (ZOCOR) 20 MG tablet TAKE ONE TABLET BY MOUTH IN THE EVENING (Patient not taking: Reported on 12/02/2015) 90 tablet 3  . valACYclovir (VALTREX) 500 MG tablet Take 1 tablet by mouth 2 (two) times daily.     No current facility-administered medications for this visit.    Allergies: Allergies  Allergen Reactions  . Decadrol [Dexamethasone] Anaphylaxis  . Doxycycline     SEVERE HEADACHES  . Lipitor [Atorvastatin] Other (See Comments)    Patient stated legs hurt so bad she could hardly walk   . Aldactone [Spironolactone]     Burning in stomach Burning in stomach  . Hydrocodone   . Levofloxacin Other (See Comments)    Severe Myalgias  . Losartan Swelling  . Prednisone   . Pseudoephedrine     stroke  . Quinolones   . Sertraline   . Sertraline Hcl   . Sudafed [Pseudoephedrine Hcl]   . Trazodone And Nefazodone     Side affects  . Ultram [Tramadol]     Headaches  Headaches   .  Adhesive [Tape] Rash  . Hydrocodone-Acetaminophen Anxiety  . Latex Rash    Social History: The patient  reports that she has been passively smoking.  She has never used smokeless tobacco. She reports that she does not drink alcohol or use illicit drugs.   Family History: The patient's ***family history includes Asthma in her father; Cancer in her brother, father, sister, and sister; Cervical cancer in her sister; Diabetes in her father and sister; Emphysema in her father; Heart failure in her father; Stroke (age of onset: 49) in her mother.   Review of Systems: Please see the history of present illness.   Otherwise, the review of systems is positive for {NONE DEFAULTED:18576::"none"}.   All other systems are reviewed and negative.   Physical Exam: VS:  There were no vitals taken for this visit. [name]  BMI There is no weight on file to calculate BMI.  Wt Readings from Last 3 Encounters:  12/02/15 142 lb 9.6 oz (64.683 kg)  09/12/15 139 lb (63.05 kg)  07/31/15 138 lb (62.596 kg)    General: Pleasant. Well developed, well nourished and in no acute distress.  HEENT: Normal. Neck: Supple, no JVD, carotid bruits, or masses noted.  Cardiac: ***Regular rate and rhythm. No murmurs, rubs, or gallops. No edema.  Respiratory:  Lungs are clear to auscultation bilaterally with normal work of breathing.  GI: Soft and nontender.  MS: No deformity or atrophy. Gait and ROM intact. Skin: Warm and dry. Color is normal.  Neuro:  Strength and sensation are intact and no gross focal deficits noted.  Psych: Alert, appropriate and with normal affect.   LABORATORY DATA:  EKG:  EKG {ACTION; IS/IS GI:[id number] ordered today. This demonstrates ***.  Lab Results  Component Value Date   WBC 7.2 02/03/2015   HGB 12.6 07/31/2015   HCT 37.0 07/31/2015   PLT 314 02/03/2015   GLUCOSE 103* 07/31/2015   CHOL 168 07/09/2015   TRIG 110 07/09/2015   HDL 47 07/09/2015   LDLCALC 99 07/09/2015   ALT 11 07/09/2015    AST 15 07/09/2015   NA 141 07/31/2015   K 4.1 07/31/2015   CL 104 07/31/2015   CREATININE 0.90 07/31/2015   BUN 24* 07/31/2015  CO2 28 07/09/2015    BNP (last 3 results) No results for input(s): BNP in the last 8760 hours.  ProBNP (last 3 results) No results for input(s): PROBNP in the last 8760 hours.   Other Studies Reviewed Today:   Assessment/Plan:   Current medicines are reviewed with the patient today.  The patient does not have concerns regarding medicines other than what has been noted above.  The following changes have been made:  See above.  Labs/ tests ordered today include:   No orders of the defined types were placed in this encounter.     Disposition:   FU with *** in {gen number [id number] {Days to years:10300}.   Patient is agreeable to this plan and will call if any problems develop in the interim.   Signed: Burtis Junes, RN, ANP-C 12/10/2015 7:56 AM  [facility] [address] [place], [place]  [address] Phone: [phone] Fax: [phone]

## 2015-12-11 ENCOUNTER — Ambulatory Visit: Payer: Medicare Other | Admitting: Physical Therapy

## 2015-12-15 DIAGNOSIS — Z5112 Encounter for antineoplastic immunotherapy: Secondary | ICD-10-CM | POA: Diagnosis not present

## 2015-12-15 DIAGNOSIS — C9000 Multiple myeloma not having achieved remission: Secondary | ICD-10-CM | POA: Diagnosis not present

## 2015-12-15 DIAGNOSIS — Z79899 Other long term (current) drug therapy: Secondary | ICD-10-CM | POA: Diagnosis not present

## 2015-12-19 ENCOUNTER — Telehealth: Payer: Self-pay | Admitting: Pulmonary Disease

## 2015-12-19 NOTE — Telephone Encounter (Signed)
Did they provide her with alternatives? If not she can call them for a list.  JN

## 2015-12-19 NOTE — Telephone Encounter (Signed)
LM x 1 

## 2015-12-19 NOTE — Telephone Encounter (Signed)
Spoke with pt, states she received a letter from [facility] saying that they will no longer cover Flonase for pt.  Pt is requesting an alternative medication.  States she has enough to get through this month but will need an alternative for next month and beyond.  Pt uses [facility] on [address]    JN please advise on recs.  Thanks!

## 2015-12-22 DIAGNOSIS — Z5112 Encounter for antineoplastic immunotherapy: Secondary | ICD-10-CM | POA: Diagnosis not present

## 2015-12-22 DIAGNOSIS — C9000 Multiple myeloma not having achieved remission: Secondary | ICD-10-CM | POA: Diagnosis not present

## 2015-12-22 DIAGNOSIS — Z85820 Personal history of malignant melanoma of skin: Secondary | ICD-10-CM | POA: Diagnosis not present

## 2015-12-22 NOTE — Telephone Encounter (Signed)
Patient home now from chemo, CB [phone].

## 2015-12-22 NOTE — Telephone Encounter (Signed)
Spoke with the pt  She will call insurance for alternative and call us back

## 2015-12-22 NOTE — Telephone Encounter (Signed)
lmtcb

## 2015-12-23 ENCOUNTER — Encounter: Payer: Self-pay | Admitting: Physical Therapy

## 2015-12-23 ENCOUNTER — Ambulatory Visit: Payer: Medicare Other | Attending: Obstetrics and Gynecology | Admitting: Physical Therapy

## 2015-12-23 DIAGNOSIS — M6289 Other specified disorders of muscle: Secondary | ICD-10-CM | POA: Diagnosis not present

## 2015-12-23 DIAGNOSIS — R198 Other specified symptoms and signs involving the digestive system and abdomen: Secondary | ICD-10-CM | POA: Insufficient documentation

## 2015-12-23 DIAGNOSIS — R102 Pelvic and perineal pain: Secondary | ICD-10-CM

## 2015-12-23 DIAGNOSIS — N949 Unspecified condition associated with female genital organs and menstrual cycle: Secondary | ICD-10-CM | POA: Diagnosis not present

## 2015-12-23 DIAGNOSIS — R29898 Other symptoms and signs involving the musculoskeletal system: Secondary | ICD-10-CM

## 2015-12-23 DIAGNOSIS — N8184 Pelvic muscle wasting: Secondary | ICD-10-CM | POA: Insufficient documentation
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
## 2015-12-24 DIAGNOSIS — Z85820 Personal history of malignant melanoma of skin: Secondary | ICD-10-CM | POA: Diagnosis not present

## 2015-12-24 DIAGNOSIS — L298 Other pruritus: Secondary | ICD-10-CM | POA: Diagnosis not present

## 2015-12-24 NOTE — Telephone Encounter (Signed)
LMTCB

## 2015-12-25 NOTE — Telephone Encounter (Signed)
lmtcb x1 for Cindy 

## 2015-12-25 NOTE — Telephone Encounter (Signed)
[name], [phone]

## 2015-12-25 NOTE — Telephone Encounter (Signed)
Pt states that she is still working on getting alternatives.  Will call once she knows more information.

## 2015-12-25 NOTE — Telephone Encounter (Signed)
Spoke with pt, advised that we have been trying to get in contact with her "insurance lady" Fonda Kinder regarding her flonase alternatives, and once we hear from cindy regarding alternatives that we would be in contact with her.  Pt expressed understanding.  lmtcb for [employer]

## 2015-12-25 NOTE — Telephone Encounter (Signed)
Pt returning call.[name] ° °

## 2015-12-25 NOTE — Telephone Encounter (Signed)
LVM for [name] to return call

## 2015-12-25 NOTE — Telephone Encounter (Signed)
Pt calling to check on status medication.[name]

## 2015-12-25 NOTE — Telephone Encounter (Signed)
Fonda Kinder calling back [phone]

## 2015-12-25 NOTE — Telephone Encounter (Signed)
Spoke with pt. States that she will check with her insurance company and see what they will cover in place of Flonase. Will await her return call.

## 2015-12-26 NOTE — Telephone Encounter (Signed)
Spoke with [name] with [employer], states that generic flonase is in fact on pt's formulary-tier two medication.   Called [facility] [address] where rx was most recently filled at, states that medication is covered and is only $7/month.  This is an increase from her $2 copay last year.    Spoke with pt, aware of above.  Nothing further needed.

## 2015-12-27 ENCOUNTER — Other Ambulatory Visit: Payer: Self-pay | Admitting: Oncology

## 2015-12-29 DIAGNOSIS — C9000 Multiple myeloma not having achieved remission: Secondary | ICD-10-CM | POA: Diagnosis not present

## 2015-12-29 DIAGNOSIS — Z85820 Personal history of malignant melanoma of skin: Secondary | ICD-10-CM | POA: Diagnosis not present

## 2015-12-29 DIAGNOSIS — Z5112 Encounter for antineoplastic immunotherapy: Secondary | ICD-10-CM | POA: Diagnosis not present

## 2015-12-31 ENCOUNTER — Encounter: Payer: Self-pay | Admitting: Physical Therapy

## 2015-12-31 ENCOUNTER — Ambulatory Visit: Payer: Medicare Other | Admitting: Physical Therapy

## 2015-12-31 DIAGNOSIS — M6289 Other specified disorders of muscle: Secondary | ICD-10-CM

## 2015-12-31 DIAGNOSIS — N8184 Pelvic muscle wasting: Secondary | ICD-10-CM | POA: Diagnosis not present

## 2015-12-31 DIAGNOSIS — N949 Unspecified condition associated with female genital organs and menstrual cycle: Secondary | ICD-10-CM

## 2015-12-31 DIAGNOSIS — R102 Pelvic and perineal pain unspecified side: Secondary | ICD-10-CM

## 2015-12-31 DIAGNOSIS — R198 Other specified symptoms and signs involving the digestive system and abdomen: Secondary | ICD-10-CM | POA: Diagnosis not present

## 2015-12-31 NOTE — Patient Instructions (Signed)
Semi-Reclined    Partially recline in a comfortable position. Allow body's muscles to relax. Place hands on belly. Inhale slowly and deeply for _3__ seconds, so hands move out. Then take _3__ seconds to exhale. Repeat _10__ times. Do _3__ times a day. Use a towel to wrap around your stomach. Copyright  VHI. All rights reserved.  Sitting    Sit comfortably. Allow body's muscles to relax. Place hands on belly. Inhale slowly and deeply for _3__ seconds, so hands move out. Then take _3__ seconds to exhale. Repeat _10__ times. Do __3_ times a day. Use a towel around stomach. Copyright  VHI. All rights reserved.  Piriformis Stretch, Sitting    Sit, one ankle on opposite knee, same-side hand on crossed knee. Push down on knee, keeping spine straight. Lean torso forward, with flat back, until tension is felt in hamstrings and gluteals of crossed-leg side. Hold 30___ seconds.  Repeat _2__ times per session. Do _1__ sessions per day.  Copyright  VHI. All rights reserved.   Chair Sitting    Sit at edge of seat, spine straight, one leg extended and toe pointed upward. Put a hand on each thigh and bend forward from the hip, keeping spine straight. Allow hand on extended leg to reach toward toes. Support upper body with other arm. Hold _30__ seconds. Repeat __2_ times per session. Do _1__ sessions per day.  Copyright  VHI. All rights reserved.  [name] [address] [place], [place] [address] Phone # [phone] Fax [phone]

## 2015-12-31 NOTE — Therapy (Addendum)
[facility] [address], [place], [place], [address] Phone: [phone]   Fax:  [phone]  Physical Therapy Treatment  Patient Details  Name: [name] MRN: [id number] Date of Birth: 1941/08/06 Referring Provider: Dr. Dian Queen  Encounter Date: 12/31/2015      PT End of Session - 12/31/15 0855    Visit Number 2   Number of Visits 10  Medicare   Date for PT Re-Evaluation 02/17/16   Authorization Type G-code 10th visit; Kx modifier after 15 visits   PT Start Time 0853   PT Stop Time 0931   PT Time Calculation (min) 38 min   Activity Tolerance Patient tolerated treatment well   Behavior During Therapy Anxious;WFL for tasks assessed/performed      Past Medical History  Diagnosis Date  . 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 History of blood clots     LEG  . Stroke ([place])   . Hernia   . MGUS (monoclonal gammopathy of unknown significance)   . Chronic kidney disease   . Hypertension   . Hyperlipidemia   . Thyroid disease   . Arthritis   . Mitral valve prolapse   . Anxiety   . Perforated bowel ([place])   . Peritonitis ([place])   . Diverticulosis     Pt reported on 03/15/12  . MGUS (monoclonal gammopathy of unknown significance)   . Cancer (HCC)     THYROID, SKIN, NOSE, BONE  . Melanoma ([place])   . Melanoma ([place]) 1989    chest  . Asthma     Past Surgical History  Procedure Laterality Date  . Abdominal hysterectomy    . Rotator cuff repair  2004    RIGHT SHOULDER  . Hernia repair    . Leg surgery      VEIN & BLOOD CLOT  . Cardiovascular stress test  2006    NORMAL  . Transthoracic echocardiogram  2006  . Melanoma excision      R eye  . Skin cancer excision  2011, 2013    squamous cell of nose x 2  . Breast lumpectomy  12/2010    R breast  . Breast lumpectomy  2004    L axilla neg for cancer.    There were no vitals filed for this visit.  Visit Diagnosis:  Perineal pain in  female  Pelvic floor dysfunction      Subjective Assessment - 12/31/15 0856    Subjective Ready to get going.    How long can you sit comfortably? pain with sitting   Patient Stated Goals reduce pain; reduce swelling   Currently in Pain? Yes   Pain Score 2    Pain Location Vagina   Pain Orientation Right;Left;Mid   Pain Descriptors / Indicators Burning;Aching   Pain Type Acute pain   Pain Onset More than a month ago   Pain Frequency Rarely   Aggravating Factors  sit and bendi over   Pain Relieving Factors 2 sitz bath per day   Multiple Pain Sites No     G-code on 12/31/2015 is functional assessment tool used is FOTO score is 71% limitation, functional limitation other PT primary, other PT primary goal status CL, and discharge is CL.  Earlie Counts, PT 01/07/2016 10:39 AM                      [facility] Adult PT Treatment/Exercise - 12/31/15 0001  Posture/Postural Control   Posture Comments educated on no crossing legs, relax shoulders, no slouching, relaxing   Manual Therapy   Manual Therapy Soft tissue mobilization   Soft tissue mobilization bil. quads, hamstring, hip adductors                PT Education - 12/31/15 0918    Education provided Yes   Education Details diaphgramatic breathing, piriformis stretch, hamstring stretch   Person(s) Educated Patient   Methods Explanation;Demonstration;Verbal cues   Comprehension Returned demonstration;Verbalized understanding          PT Short Term Goals - 12/31/15 0937    PT SHORT TERM GOAL #1   Title independent with initial HEP   Time 4   Period Weeks   Status On-going  just started   PT SHORT TERM GOAL #2   Title understand correct posture to decrease strain on pelvic floor   Time 4   Period Weeks   Status On-going   PT SHORT TERM GOAL #3   Title sit with pain decreased >/= 25%   Time 4   Period Weeks   Status On-going   PT SHORT TERM GOAL #4   Title understand ways to reduce vaginal  dryness to reduce burning and itching of vaginal tissue   Time 4   Period Weeks   Status New           PT Long Term Goals - 12/23/15 8099    PT LONG TERM GOAL #1   Title independent with HEP    Time 8   Period Weeks   Status New   PT LONG TERM GOAL #2   Title sit with pain decreased >/= 75%   Time 8   Period Weeks   Status New   PT LONG TERM GOAL #3   Title burning and itching pain in vaginal area decreased >/= 75%   Time 8   Period Weeks   Status New   PT LONG TERM GOAL #4   Title ability to sit on riding mower with minimal pain due to improved flexibility of muscles   Time 8   Period Weeks   Status New               Plan - 12/31/15 0933    Clinical Impression Statement Patient is a 75 year old female with diagnosis of pelvic floor dysfunction.  Patient is very anxius and holds herself in a stressed posture.  Patient is not comfortable with therapist performing soft tissue work in the perineal areal through clothing.  Pelvic floor muscles will have to be treated indirectly.  Patient had difficulty with diaphragmatic breathing and needed a towel wrapped aroun her abdomen to give her tactile cues.  Patient reports she was able to relax after diaphragmatic beathing.  Patient will benefit from physical therapy to reduce pain, improve strength and function.    Pt will benefit from skilled therapeutic intervention in order to improve on the following deficits Pain;Increased fascial restricitons;Decreased mobility;Postural dysfunction;Decreased endurance;Decreased activity tolerance;Decreased range of motion;Decreased strength   Rehab Potential Excellent   Clinical Impairments Affecting Rehab Potential No ultrasound; presently having chemotherapy   PT Frequency 1x / week   PT Duration 8 weeks   PT Treatment/Interventions ADLs/Self Care Home Management;Biofeedback;Electrical Stimulation;Moist Heat;Therapeutic activities;Therapeutic exercise;Balance training;Patient/family  education;Neuromuscular re-education;Manual techniques;Passive range of motion;Dry needling   PT Next Visit Plan soft tissue work, stretches, correct pelvis   PT Home Exercise Plan posture   Consulted and Agree with Plan  of Care Patient        Problem List Patient Active Problem List   Diagnosis Date Noted  . Periorbital edema 01/22/2015  . Allergic rhinitis 08/12/2014  . Anxiety 07/04/2014  . 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[place]) 07/04/2014  . Multiple myeloma without remission ([place]) 11/01/2013  . Cerebral vascular accident ([place]) 02/19/2013  . Bloodgood disease 02/19/2013  . Acid reflux 02/19/2013  . Blood in the urine 02/19/2013  . Billowing mitral valve 02/19/2013  . Adenomatous colon polyp 02/19/2013  . 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 Cataract, nuclear 12/16/2011  . Intragel vitreous hemorrhage ([place]) 12/16/2011  . Age-related macular degeneration, dry 12/02/2011  . Post-radiation retinopathy 12/02/2011  . Pseudophakia 12/02/2011  . MGUS (monoclonal gammopathy of unknown significance) 11/26/2011  . 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 Extrinsic asthma 09/21/2011  . Colon, diverticulosis 07/26/2011  . Benign hypertensive heart disease without heart failure 03/31/2011  . Hypercholesterolemia 03/31/2011  . Hypothyroidism 03/31/2011  . History of melanoma 03/31/2011  . Hx of thyroid cancer 03/31/2011  . H/O malignant melanoma of skin 03/31/2011    Earlie Counts, PT 12/31/2015 9:39 AM   [facility] [address] [place], [place], [address] Phone: [phone]   Fax:  [phone]  Name: [name] MRN: [id number] Date of Birth: [birth year]  PHYSICAL THERAPY DISCHARGE SUMMARY  Visits from Start of Care: 2 Current functional level related to goals / functional outcomes: See above.  Patient called on 01/07/2016 reporting she will not be returning to therapy after she spoke to her  doctor.    Remaining deficits: See above.    Education / Equipment: HEP  Plan: Patient agrees to discharge.  Patient goals were not met. Patient is being discharged due to the patient's request. Thank you for the referral. Earlie Counts, PT 01/07/2016 10:44 AM   ?????

## 2016-01-05 DIAGNOSIS — C73 Malignant neoplasm of thyroid gland: Secondary | ICD-10-CM | POA: Diagnosis not present

## 2016-01-05 DIAGNOSIS — N390 Urinary tract infection, site not specified: Secondary | ICD-10-CM | POA: Diagnosis not present

## 2016-01-05 DIAGNOSIS — E890 Postprocedural hypothyroidism: Secondary | ICD-10-CM | POA: Diagnosis not present

## 2016-01-05 DIAGNOSIS — R102 Pelvic and perineal pain: Secondary | ICD-10-CM | POA: Diagnosis not present

## 2016-01-08 ENCOUNTER — Encounter: Payer: Medicare Other | Admitting: Physical Therapy

## 2016-01-12 DIAGNOSIS — Z79899 Other long term (current) drug therapy: Secondary | ICD-10-CM | POA: Diagnosis not present

## 2016-01-12 DIAGNOSIS — Z5181 Encounter for therapeutic drug level monitoring: Secondary | ICD-10-CM | POA: Diagnosis not present

## 2016-01-12 DIAGNOSIS — C9000 Multiple myeloma not having achieved remission: Secondary | ICD-10-CM | POA: Diagnosis not present

## 2016-01-12 DIAGNOSIS — R109 Unspecified abdominal pain: Secondary | ICD-10-CM | POA: Diagnosis not present

## 2016-01-14 ENCOUNTER — Encounter: Payer: Self-pay | Admitting: *Deleted

## 2016-01-15 ENCOUNTER — Encounter: Payer: Medicare Other | Admitting: Physical Therapy

## 2016-01-15 ENCOUNTER — Other Ambulatory Visit: Payer: Self-pay | Admitting: *Deleted

## 2016-01-15 DIAGNOSIS — K5730 Diverticulosis of large intestine without perforation or abscess without bleeding: Secondary | ICD-10-CM | POA: Diagnosis not present

## 2016-01-15 DIAGNOSIS — R1031 Right lower quadrant pain: Secondary | ICD-10-CM | POA: Diagnosis not present

## 2016-01-15 DIAGNOSIS — M47816 Spondylosis without myelopathy or radiculopathy, lumbar region: Secondary | ICD-10-CM | POA: Diagnosis not present

## 2016-01-15 DIAGNOSIS — K769 Liver disease, unspecified: Secondary | ICD-10-CM | POA: Diagnosis not present

## 2016-01-15 MED ORDER — AMLODIPINE BESYLATE 5 MG PO TABS: 5.0000 mg | ORAL_TABLET | Freq: Every day | ORAL | Status: DC

## 2016-01-19 DIAGNOSIS — Z5111 Encounter for antineoplastic chemotherapy: Secondary | ICD-10-CM | POA: Diagnosis not present

## 2016-01-19 DIAGNOSIS — C9000 Multiple myeloma not having achieved remission: Secondary | ICD-10-CM | POA: Diagnosis not present

## 2016-01-19 DIAGNOSIS — N952 Postmenopausal atrophic vaginitis: Secondary | ICD-10-CM | POA: Diagnosis not present

## 2016-01-22 ENCOUNTER — Encounter: Payer: Medicare Other | Admitting: Physical Therapy

## 2016-01-23 DIAGNOSIS — R928 Other abnormal and inconclusive findings on diagnostic imaging of breast: Secondary | ICD-10-CM | POA: Diagnosis not present

## 2016-01-26 DIAGNOSIS — Z5111 Encounter for antineoplastic chemotherapy: Secondary | ICD-10-CM | POA: Diagnosis not present

## 2016-01-26 DIAGNOSIS — Z79899 Other long term (current) drug therapy: Secondary | ICD-10-CM | POA: Diagnosis not present

## 2016-01-26 DIAGNOSIS — C9000 Multiple myeloma not having achieved remission: Secondary | ICD-10-CM | POA: Diagnosis not present

## 2016-01-28 DIAGNOSIS — R8290 Unspecified abnormal findings in urine: Secondary | ICD-10-CM | POA: Diagnosis not present

## 2016-01-28 DIAGNOSIS — Z6826 Body mass index (BMI) 26.0-26.9, adult: Secondary | ICD-10-CM | POA: Diagnosis not present

## 2016-01-28 DIAGNOSIS — N94819 Vulvodynia, unspecified: Secondary | ICD-10-CM | POA: Diagnosis not present

## 2016-01-28 DIAGNOSIS — L298 Other pruritus: Secondary | ICD-10-CM | POA: Diagnosis not present

## 2016-01-29 ENCOUNTER — Encounter: Payer: Medicare Other | Admitting: Physical Therapy

## 2016-02-02 DIAGNOSIS — R140 Abdominal distension (gaseous): Secondary | ICD-10-CM | POA: Diagnosis not present

## 2016-02-02 DIAGNOSIS — Z85850 Personal history of malignant neoplasm of thyroid: Secondary | ICD-10-CM | POA: Diagnosis not present

## 2016-02-02 DIAGNOSIS — R102 Pelvic and perineal pain: Secondary | ICD-10-CM | POA: Diagnosis not present

## 2016-02-03 ENCOUNTER — Other Ambulatory Visit: Payer: Self-pay | Admitting: Nephrology

## 2016-02-03 DIAGNOSIS — E039 Hypothyroidism, unspecified: Secondary | ICD-10-CM | POA: Diagnosis not present

## 2016-02-03 DIAGNOSIS — C6990 Malignant neoplasm of unspecified site of unspecified eye: Secondary | ICD-10-CM | POA: Diagnosis not present

## 2016-02-03 DIAGNOSIS — R140 Abdominal distension (gaseous): Secondary | ICD-10-CM | POA: Diagnosis not present

## 2016-02-03 DIAGNOSIS — R319 Hematuria, unspecified: Secondary | ICD-10-CM | POA: Diagnosis not present

## 2016-02-03 DIAGNOSIS — E785 Hyperlipidemia, unspecified: Secondary | ICD-10-CM | POA: Diagnosis not present

## 2016-02-03 DIAGNOSIS — I8393 Asymptomatic varicose veins of bilateral lower extremities: Secondary | ICD-10-CM | POA: Diagnosis not present

## 2016-02-03 DIAGNOSIS — I129 Hypertensive chronic kidney disease with stage 1 through stage 4 chronic kidney disease, or unspecified chronic kidney disease: Secondary | ICD-10-CM | POA: Diagnosis not present

## 2016-02-03 DIAGNOSIS — C73 Malignant neoplasm of thyroid gland: Secondary | ICD-10-CM | POA: Diagnosis not present

## 2016-02-03 DIAGNOSIS — R188 Other ascites: Secondary | ICD-10-CM

## 2016-02-03 DIAGNOSIS — N182 Chronic kidney disease, stage 2 (mild): Secondary | ICD-10-CM | POA: Diagnosis not present

## 2016-02-03 DIAGNOSIS — I639 Cerebral infarction, unspecified: Secondary | ICD-10-CM | POA: Diagnosis not present

## 2016-02-03 DIAGNOSIS — C9000 Multiple myeloma not having achieved remission: Secondary | ICD-10-CM | POA: Diagnosis not present

## 2016-02-04 ENCOUNTER — Encounter: Payer: Medicare Other | Admitting: Physical Therapy

## 2016-02-05 DIAGNOSIS — E039 Hypothyroidism, unspecified: Secondary | ICD-10-CM | POA: Diagnosis not present

## 2016-02-05 DIAGNOSIS — E785 Hyperlipidemia, unspecified: Secondary | ICD-10-CM | POA: Diagnosis not present

## 2016-02-05 DIAGNOSIS — R188 Other ascites: Secondary | ICD-10-CM | POA: Diagnosis not present

## 2016-02-05 DIAGNOSIS — R140 Abdominal distension (gaseous): Secondary | ICD-10-CM | POA: Diagnosis not present

## 2016-02-05 DIAGNOSIS — C73 Malignant neoplasm of thyroid gland: Secondary | ICD-10-CM | POA: Diagnosis not present

## 2016-02-05 DIAGNOSIS — N182 Chronic kidney disease, stage 2 (mild): Secondary | ICD-10-CM | POA: Diagnosis not present

## 2016-02-05 DIAGNOSIS — C6990 Malignant neoplasm of unspecified site of unspecified eye: Secondary | ICD-10-CM | POA: Diagnosis not present

## 2016-02-09 DIAGNOSIS — Z885 Allergy status to narcotic agent status: Secondary | ICD-10-CM | POA: Diagnosis not present

## 2016-02-09 DIAGNOSIS — C9000 Multiple myeloma not having achieved remission: Secondary | ICD-10-CM | POA: Diagnosis not present

## 2016-02-09 DIAGNOSIS — R319 Hematuria, unspecified: Secondary | ICD-10-CM | POA: Diagnosis not present

## 2016-02-09 DIAGNOSIS — Z6826 Body mass index (BMI) 26.0-26.9, adult: Secondary | ICD-10-CM | POA: Diagnosis not present

## 2016-02-09 DIAGNOSIS — Z5112 Encounter for antineoplastic immunotherapy: Secondary | ICD-10-CM | POA: Diagnosis not present

## 2016-02-09 DIAGNOSIS — D472 Monoclonal gammopathy: Secondary | ICD-10-CM | POA: Diagnosis not present

## 2016-02-11 ENCOUNTER — Encounter: Payer: Medicare Other | Admitting: Physical Therapy

## 2016-02-11 DIAGNOSIS — H01004 Unspecified blepharitis left upper eyelid: Secondary | ICD-10-CM | POA: Diagnosis not present

## 2016-02-11 DIAGNOSIS — H04123 Dry eye syndrome of bilateral lacrimal glands: Secondary | ICD-10-CM | POA: Diagnosis not present

## 2016-02-11 DIAGNOSIS — H01001 Unspecified blepharitis right upper eyelid: Secondary | ICD-10-CM | POA: Diagnosis not present

## 2016-02-16 DIAGNOSIS — C9000 Multiple myeloma not having achieved remission: Secondary | ICD-10-CM | POA: Diagnosis not present

## 2016-02-16 DIAGNOSIS — Z79899 Other long term (current) drug therapy: Secondary | ICD-10-CM | POA: Diagnosis not present

## 2016-02-16 DIAGNOSIS — Z5112 Encounter for antineoplastic immunotherapy: Secondary | ICD-10-CM | POA: Diagnosis not present

## 2016-02-17 DIAGNOSIS — C9000 Multiple myeloma not having achieved remission: Secondary | ICD-10-CM | POA: Diagnosis not present

## 2016-02-17 DIAGNOSIS — H0289 Other specified disorders of eyelid: Secondary | ICD-10-CM | POA: Diagnosis not present

## 2016-02-17 DIAGNOSIS — Z6826 Body mass index (BMI) 26.0-26.9, adult: Secondary | ICD-10-CM | POA: Diagnosis not present

## 2016-02-17 DIAGNOSIS — B373 Candidiasis of vulva and vagina: Secondary | ICD-10-CM | POA: Diagnosis not present

## 2016-02-19 DIAGNOSIS — C9000 Multiple myeloma not having achieved remission: Secondary | ICD-10-CM | POA: Diagnosis not present

## 2016-02-23 DIAGNOSIS — C6932 Malignant neoplasm of left choroid: Secondary | ICD-10-CM | POA: Diagnosis not present

## 2016-02-23 DIAGNOSIS — H3581 Retinal edema: Secondary | ICD-10-CM | POA: Diagnosis not present

## 2016-02-23 DIAGNOSIS — D3121 Benign neoplasm of right retina: Secondary | ICD-10-CM | POA: Diagnosis not present

## 2016-02-23 DIAGNOSIS — H31091 Other chorioretinal scars, right eye: Secondary | ICD-10-CM | POA: Diagnosis not present

## 2016-02-23 DIAGNOSIS — D3122 Benign neoplasm of left retina: Secondary | ICD-10-CM | POA: Diagnosis not present

## 2016-02-23 DIAGNOSIS — H31092 Other chorioretinal scars, left eye: Secondary | ICD-10-CM | POA: Diagnosis not present

## 2016-03-08 DIAGNOSIS — C9000 Multiple myeloma not having achieved remission: Secondary | ICD-10-CM | POA: Diagnosis not present

## 2016-03-08 DIAGNOSIS — Z6826 Body mass index (BMI) 26.0-26.9, adult: Secondary | ICD-10-CM | POA: Diagnosis not present

## 2016-03-08 DIAGNOSIS — Z5111 Encounter for antineoplastic chemotherapy: Secondary | ICD-10-CM | POA: Diagnosis not present

## 2016-03-15 DIAGNOSIS — Z79899 Other long term (current) drug therapy: Secondary | ICD-10-CM | POA: Diagnosis not present

## 2016-03-15 DIAGNOSIS — Z6826 Body mass index (BMI) 26.0-26.9, adult: Secondary | ICD-10-CM | POA: Diagnosis not present

## 2016-03-15 DIAGNOSIS — C6931 Malignant neoplasm of right choroid: Secondary | ICD-10-CM | POA: Diagnosis not present

## 2016-03-15 DIAGNOSIS — C9000 Multiple myeloma not having achieved remission: Secondary | ICD-10-CM | POA: Diagnosis not present

## 2016-03-15 DIAGNOSIS — Z85820 Personal history of malignant melanoma of skin: Secondary | ICD-10-CM | POA: Diagnosis not present

## 2016-03-15 DIAGNOSIS — Z5111 Encounter for antineoplastic chemotherapy: Secondary | ICD-10-CM | POA: Diagnosis not present

## 2016-03-18 DIAGNOSIS — H04123 Dry eye syndrome of bilateral lacrimal glands: Secondary | ICD-10-CM | POA: Diagnosis not present

## 2016-03-18 DIAGNOSIS — H10011 Acute follicular conjunctivitis, right eye: Secondary | ICD-10-CM | POA: Diagnosis not present

## 2016-03-18 DIAGNOSIS — F411 Generalized anxiety disorder: Secondary | ICD-10-CM | POA: Diagnosis not present

## 2016-03-22 DIAGNOSIS — Z79899 Other long term (current) drug therapy: Secondary | ICD-10-CM | POA: Diagnosis not present

## 2016-03-22 DIAGNOSIS — Z5112 Encounter for antineoplastic immunotherapy: Secondary | ICD-10-CM | POA: Diagnosis not present

## 2016-03-22 DIAGNOSIS — H00032 Abscess of right lower eyelid: Secondary | ICD-10-CM | POA: Diagnosis not present

## 2016-03-22 DIAGNOSIS — H10011 Acute follicular conjunctivitis, right eye: Secondary | ICD-10-CM | POA: Diagnosis not present

## 2016-03-22 DIAGNOSIS — C9000 Multiple myeloma not having achieved remission: Secondary | ICD-10-CM | POA: Diagnosis not present

## 2016-03-22 DIAGNOSIS — H00031 Abscess of right upper eyelid: Secondary | ICD-10-CM | POA: Diagnosis not present

## 2016-03-23 ENCOUNTER — Ambulatory Visit: Payer: Medicare Other | Admitting: Pulmonary Disease

## 2016-03-25 DIAGNOSIS — H01001 Unspecified blepharitis right upper eyelid: Secondary | ICD-10-CM | POA: Diagnosis not present

## 2016-03-25 DIAGNOSIS — H01004 Unspecified blepharitis left upper eyelid: Secondary | ICD-10-CM | POA: Diagnosis not present

## 2016-03-25 DIAGNOSIS — H01005 Unspecified blepharitis left lower eyelid: Secondary | ICD-10-CM | POA: Diagnosis not present

## 2016-03-25 DIAGNOSIS — H01002 Unspecified blepharitis right lower eyelid: Secondary | ICD-10-CM | POA: Diagnosis not present

## 2016-03-26 ENCOUNTER — Telehealth: Payer: Self-pay | Admitting: Pulmonary Disease

## 2016-03-26 NOTE — Telephone Encounter (Signed)
Patient states that her best friend died Jan 13, 2023 and she missed appointment.  She thought she cancelled the appointment.  She said that she is having pain on the left side of her chest under her breast.  She has been under a lot of stress. SOB.  Patient scheduled to see Dr. Vaughan Browner on Monday at 245pm. Patient given instructions to go to ER if her chest pain worsens or she has any other symptoms of SOB or discomfort. Nothing further needed.

## 2016-03-29 ENCOUNTER — Ambulatory Visit: Payer: Medicare Other | Admitting: Pulmonary Disease

## 2016-04-05 DIAGNOSIS — Z85820 Personal history of malignant melanoma of skin: Secondary | ICD-10-CM | POA: Diagnosis not present

## 2016-04-05 DIAGNOSIS — Z79899 Other long term (current) drug therapy: Secondary | ICD-10-CM | POA: Diagnosis not present

## 2016-04-05 DIAGNOSIS — C9000 Multiple myeloma not having achieved remission: Secondary | ICD-10-CM | POA: Diagnosis not present

## 2016-04-05 DIAGNOSIS — Z5112 Encounter for antineoplastic immunotherapy: Secondary | ICD-10-CM | POA: Diagnosis not present

## 2016-04-09 DIAGNOSIS — L723 Sebaceous cyst: Secondary | ICD-10-CM | POA: Diagnosis not present

## 2016-04-09 DIAGNOSIS — D485 Neoplasm of uncertain behavior of skin: Secondary | ICD-10-CM | POA: Diagnosis not present

## 2016-04-19 DIAGNOSIS — Z6827 Body mass index (BMI) 27.0-27.9, adult: Secondary | ICD-10-CM | POA: Diagnosis not present

## 2016-04-19 DIAGNOSIS — C9000 Multiple myeloma not having achieved remission: Secondary | ICD-10-CM | POA: Diagnosis not present

## 2016-04-19 DIAGNOSIS — Z79899 Other long term (current) drug therapy: Secondary | ICD-10-CM | POA: Diagnosis not present

## 2016-04-19 DIAGNOSIS — Z5111 Encounter for antineoplastic chemotherapy: Secondary | ICD-10-CM | POA: Diagnosis not present

## 2016-04-28 DIAGNOSIS — M25551 Pain in right hip: Secondary | ICD-10-CM | POA: Diagnosis not present

## 2016-05-03 DIAGNOSIS — C9000 Multiple myeloma not having achieved remission: Secondary | ICD-10-CM | POA: Diagnosis not present

## 2016-05-03 DIAGNOSIS — Z5111 Encounter for antineoplastic chemotherapy: Secondary | ICD-10-CM | POA: Diagnosis not present

## 2016-05-03 DIAGNOSIS — Z79899 Other long term (current) drug therapy: Secondary | ICD-10-CM | POA: Diagnosis not present

## 2016-05-05 DIAGNOSIS — M25551 Pain in right hip: Secondary | ICD-10-CM | POA: Diagnosis not present

## 2016-05-06 DIAGNOSIS — M25551 Pain in right hip: Secondary | ICD-10-CM | POA: Diagnosis not present

## 2016-05-06 DIAGNOSIS — H01001 Unspecified blepharitis right upper eyelid: Secondary | ICD-10-CM | POA: Diagnosis not present

## 2016-05-06 DIAGNOSIS — H01005 Unspecified blepharitis left lower eyelid: Secondary | ICD-10-CM | POA: Diagnosis not present

## 2016-05-06 DIAGNOSIS — H01002 Unspecified blepharitis right lower eyelid: Secondary | ICD-10-CM | POA: Diagnosis not present

## 2016-05-06 DIAGNOSIS — H01004 Unspecified blepharitis left upper eyelid: Secondary | ICD-10-CM | POA: Diagnosis not present

## 2016-05-06 DIAGNOSIS — M7061 Trochanteric bursitis, right hip: Secondary | ICD-10-CM | POA: Diagnosis not present

## 2016-05-06 DIAGNOSIS — H02055 Trichiasis without entropian left lower eyelid: Secondary | ICD-10-CM | POA: Diagnosis not present

## 2016-05-07 ENCOUNTER — Encounter: Payer: Self-pay | Admitting: Pulmonary Disease

## 2016-05-07 ENCOUNTER — Ambulatory Visit (INDEPENDENT_AMBULATORY_CARE_PROVIDER_SITE_OTHER): Payer: Medicare Other | Admitting: Pulmonary Disease

## 2016-05-07 VITALS — BP 130/80 | HR 74 | Ht 61.0 in | Wt 147.4 lb

## 2016-05-07 DIAGNOSIS — K219 Gastro-esophageal reflux disease without esophagitis: Secondary | ICD-10-CM

## 2016-05-07 DIAGNOSIS — J4520 Mild intermittent asthma, uncomplicated: Secondary | ICD-10-CM

## 2016-05-07 DIAGNOSIS — J309 Allergic rhinitis, unspecified: Secondary | ICD-10-CM
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
## 2016-05-07 NOTE — Patient Instructions (Signed)
   Remember to start taking your Asmanex again in October to prevent worsening of your breathing this winter.  Check with your physicians to see if you've had the Prevnar vaccine and if not call and let me know.  I will see you back in 6 months but call me if you have any new breathing problems before then.  TESTS ORDERED: 1. Full PFTs at follow-up

## 2016-05-17 DIAGNOSIS — Z5112 Encounter for antineoplastic immunotherapy: Secondary | ICD-10-CM | POA: Diagnosis not present

## 2016-05-17 DIAGNOSIS — C9000 Multiple myeloma not having achieved remission: Secondary | ICD-10-CM | POA: Diagnosis not present

## 2016-05-18 DIAGNOSIS — M25551 Pain in right hip: Secondary | ICD-10-CM | POA: Diagnosis not present

## 2016-05-18 DIAGNOSIS — M940 Chondrocostal junction syndrome [Tietze]: Secondary | ICD-10-CM | POA: Diagnosis not present

## 2016-05-19 DIAGNOSIS — L720 Epidermal cyst: Secondary | ICD-10-CM | POA: Diagnosis not present

## 2016-05-19 DIAGNOSIS — L814 Other melanin hyperpigmentation: Secondary | ICD-10-CM | POA: Diagnosis not present

## 2016-05-19 DIAGNOSIS — L2489 Irritant contact dermatitis due to other agents: Secondary | ICD-10-CM | POA: Diagnosis not present

## 2016-05-19 DIAGNOSIS — L578 Other skin changes due to chronic exposure to nonionizing radiation: Secondary | ICD-10-CM | POA: Diagnosis not present

## 2016-05-19 DIAGNOSIS — Z85820 Personal history of malignant melanoma of skin: Secondary | ICD-10-CM | POA: Diagnosis not present

## 2016-05-19 DIAGNOSIS — L218 Other seborrheic dermatitis: Secondary | ICD-10-CM | POA: Diagnosis not present

## 2016-05-20 ENCOUNTER — Other Ambulatory Visit: Payer: Self-pay | Admitting: Obstetrics and Gynecology

## 2016-05-20 ENCOUNTER — Ambulatory Visit
Admission: RE | Admit: 2016-05-20 | Discharge: 2016-05-20 | Disposition: A | Discharge status: Home / Self Care | Payer: Medicare Other | Source: Ambulatory Visit | Attending: Obstetrics and Gynecology | Admitting: Obstetrics and Gynecology

## 2016-05-20 DIAGNOSIS — R079 Chest pain, unspecified: Secondary | ICD-10-CM

## 2016-05-20 DIAGNOSIS — M25551 Pain in right hip: Secondary | ICD-10-CM | POA: Diagnosis not present

## 2016-05-27 DIAGNOSIS — M25551 Pain in right hip: Secondary | ICD-10-CM | POA: Diagnosis not present

## 2016-05-31 ENCOUNTER — Encounter: Payer: Self-pay | Admitting: Nurse Practitioner

## 2016-05-31 DIAGNOSIS — Z79899 Other long term (current) drug therapy: Secondary | ICD-10-CM | POA: Diagnosis not present

## 2016-05-31 DIAGNOSIS — R319 Hematuria, unspecified: Secondary | ICD-10-CM | POA: Diagnosis not present

## 2016-05-31 DIAGNOSIS — L0292 Furuncle, unspecified: Secondary | ICD-10-CM | POA: Diagnosis not present

## 2016-05-31 DIAGNOSIS — Z885 Allergy status to narcotic agent status: Secondary | ICD-10-CM | POA: Diagnosis not present

## 2016-05-31 DIAGNOSIS — D472 Monoclonal gammopathy: Secondary | ICD-10-CM | POA: Diagnosis not present

## 2016-05-31 DIAGNOSIS — C9000 Multiple myeloma not having achieved remission: Secondary | ICD-10-CM | POA: Diagnosis not present

## 2016-06-01 DIAGNOSIS — E785 Hyperlipidemia, unspecified: Secondary | ICD-10-CM | POA: Diagnosis not present

## 2016-06-01 DIAGNOSIS — D369 Benign neoplasm, unspecified site: Secondary | ICD-10-CM | POA: Diagnosis not present

## 2016-06-01 DIAGNOSIS — I129 Hypertensive chronic kidney disease with stage 1 through stage 4 chronic kidney disease, or unspecified chronic kidney disease: Secondary | ICD-10-CM | POA: Diagnosis not present

## 2016-06-01 DIAGNOSIS — C73 Malignant neoplasm of thyroid gland: Secondary | ICD-10-CM | POA: Diagnosis not present

## 2016-06-01 DIAGNOSIS — E669 Obesity, unspecified: Secondary | ICD-10-CM | POA: Diagnosis not present

## 2016-06-01 DIAGNOSIS — I639 Cerebral infarction, unspecified: Secondary | ICD-10-CM | POA: Diagnosis not present

## 2016-06-01 DIAGNOSIS — N182 Chronic kidney disease, stage 2 (mild): Secondary | ICD-10-CM | POA: Diagnosis not present

## 2016-06-01 DIAGNOSIS — F439 Reaction to severe stress, unspecified: Secondary | ICD-10-CM | POA: Diagnosis not present

## 2016-06-01 DIAGNOSIS — E039 Hypothyroidism, unspecified: Secondary | ICD-10-CM | POA: Diagnosis not present

## 2016-06-01 DIAGNOSIS — R319 Hematuria, unspecified: Secondary | ICD-10-CM | POA: Diagnosis not present

## 2016-06-01 DIAGNOSIS — C9000 Multiple myeloma not having achieved remission: Secondary | ICD-10-CM | POA: Diagnosis not present

## 2016-06-01 DIAGNOSIS — I8393 Asymptomatic varicose veins of bilateral lower extremities: Secondary | ICD-10-CM | POA: Diagnosis not present

## 2016-06-03 DIAGNOSIS — M25551 Pain in right hip: Secondary | ICD-10-CM | POA: Diagnosis not present

## 2016-06-09 DIAGNOSIS — M25551 Pain in right hip: Secondary | ICD-10-CM | POA: Diagnosis not present

## 2016-06-11 ENCOUNTER — Other Ambulatory Visit: Payer: Self-pay | Admitting: *Deleted

## 2016-06-11 DIAGNOSIS — M25551 Pain in right hip: Secondary | ICD-10-CM | POA: Diagnosis not present

## 2016-06-11 MED ORDER — AMLODIPINE BESYLATE 5 MG PO TABS: 5.0000 mg | ORAL_TABLET | Freq: Every day | ORAL | 1 refills | Status: DC

## 2016-06-14 DIAGNOSIS — I119 Hypertensive heart disease without heart failure: Secondary | ICD-10-CM | POA: Diagnosis not present

## 2016-06-14 DIAGNOSIS — N6019 Diffuse cystic mastopathy of unspecified breast: Secondary | ICD-10-CM | POA: Diagnosis not present

## 2016-06-14 DIAGNOSIS — C9000 Multiple myeloma not having achieved remission: Secondary | ICD-10-CM | POA: Diagnosis not present

## 2016-06-14 DIAGNOSIS — Z85820 Personal history of malignant melanoma of skin: Secondary | ICD-10-CM | POA: Diagnosis not present

## 2016-06-14 DIAGNOSIS — Z85850 Personal history of malignant neoplasm of thyroid: Secondary | ICD-10-CM | POA: Diagnosis not present

## 2016-06-14 DIAGNOSIS — I341 Nonrheumatic mitral (valve) prolapse: Secondary | ICD-10-CM | POA: Diagnosis not present

## 2016-06-14 DIAGNOSIS — R319 Hematuria, unspecified: Secondary | ICD-10-CM | POA: Diagnosis not present

## 2016-06-14 DIAGNOSIS — H3500 Unspecified background retinopathy: Secondary | ICD-10-CM | POA: Diagnosis not present

## 2016-06-14 DIAGNOSIS — Z9225 Personal history of immunosupression therapy: Secondary | ICD-10-CM | POA: Diagnosis not present

## 2016-06-14 DIAGNOSIS — M79641 Pain in right hand: Secondary | ICD-10-CM | POA: Diagnosis not present

## 2016-06-14 DIAGNOSIS — R2242 Localized swelling, mass and lump, left lower limb: Secondary | ICD-10-CM | POA: Diagnosis not present

## 2016-06-14 DIAGNOSIS — M79642 Pain in left hand: Secondary | ICD-10-CM | POA: Diagnosis not present

## 2016-06-14 DIAGNOSIS — R2231 Localized swelling, mass and lump, right upper limb: Secondary | ICD-10-CM | POA: Diagnosis not present

## 2016-06-14 DIAGNOSIS — G629 Polyneuropathy, unspecified: Secondary | ICD-10-CM | POA: Diagnosis not present

## 2016-06-14 DIAGNOSIS — F419 Anxiety disorder, unspecified: Secondary | ICD-10-CM | POA: Diagnosis not present

## 2016-06-14 DIAGNOSIS — M7071 Other bursitis of hip, right hip: Secondary | ICD-10-CM | POA: Diagnosis not present

## 2016-06-14 DIAGNOSIS — J45909 Unspecified asthma, uncomplicated: Secondary | ICD-10-CM | POA: Diagnosis not present

## 2016-06-14 DIAGNOSIS — D472 Monoclonal gammopathy: Secondary | ICD-10-CM | POA: Diagnosis not present

## 2016-06-14 DIAGNOSIS — R229 Localized swelling, mass and lump, unspecified: Secondary | ICD-10-CM | POA: Diagnosis not present

## 2016-06-14 DIAGNOSIS — E039 Hypothyroidism, unspecified: Secondary | ICD-10-CM | POA: Diagnosis not present

## 2016-06-14 DIAGNOSIS — K5730 Diverticulosis of large intestine without perforation or abscess without bleeding: Secondary | ICD-10-CM | POA: Diagnosis not present

## 2016-06-14 DIAGNOSIS — R609 Edema, unspecified: Secondary | ICD-10-CM | POA: Diagnosis not present

## 2016-06-14 DIAGNOSIS — T451X5A Adverse effect of antineoplastic and immunosuppressive drugs, initial encounter: Secondary | ICD-10-CM | POA: Diagnosis not present

## 2016-06-14 DIAGNOSIS — G620 Drug-induced polyneuropathy: Secondary | ICD-10-CM | POA: Diagnosis not present

## 2016-06-14 DIAGNOSIS — K219 Gastro-esophageal reflux disease without esophagitis: Secondary | ICD-10-CM | POA: Diagnosis not present

## 2016-06-14 DIAGNOSIS — E7800 Pure hypercholesterolemia, unspecified: Secondary | ICD-10-CM | POA: Diagnosis not present

## 2016-06-14 DIAGNOSIS — Z6827 Body mass index (BMI) 27.0-27.9, adult: Secondary | ICD-10-CM | POA: Diagnosis not present

## 2016-06-14 DIAGNOSIS — Z8673 Personal history of transient ischemic attack (TIA), and cerebral infarction without residual deficits: Secondary | ICD-10-CM | POA: Diagnosis not present

## 2016-06-14 DIAGNOSIS — D126 Benign neoplasm of colon, unspecified: Secondary | ICD-10-CM | POA: Diagnosis not present

## 2016-06-15 ENCOUNTER — Ambulatory Visit: Payer: Medicare Other | Admitting: Nurse Practitioner

## 2016-06-15 DIAGNOSIS — Z79899 Other long term (current) drug therapy: Secondary | ICD-10-CM | POA: Diagnosis not present

## 2016-06-15 DIAGNOSIS — Z5112 Encounter for antineoplastic immunotherapy: Secondary | ICD-10-CM | POA: Diagnosis not present

## 2016-06-15 DIAGNOSIS — C9000 Multiple myeloma not having achieved remission: Secondary | ICD-10-CM | POA: Diagnosis not present

## 2016-06-16 ENCOUNTER — Ambulatory Visit (INDEPENDENT_AMBULATORY_CARE_PROVIDER_SITE_OTHER): Payer: Medicare Other | Admitting: Pulmonary Disease

## 2016-06-16 ENCOUNTER — Encounter: Payer: Self-pay | Admitting: Pulmonary Disease

## 2016-06-16 VITALS — BP 122/70 | HR 71 | Ht 61.0 in | Wt 147.0 lb

## 2016-06-16 DIAGNOSIS — J309 Allergic rhinitis, unspecified: Secondary | ICD-10-CM

## 2016-06-16 DIAGNOSIS — J4540 Moderate persistent asthma, uncomplicated: Secondary | ICD-10-CM

## 2016-06-16 DIAGNOSIS — J4520 Mild intermittent asthma, uncomplicated: Secondary | ICD-10-CM | POA: Diagnosis not present

## 2016-06-16 DIAGNOSIS — K219 Gastro-esophageal reflux disease without esophagitis: Secondary | ICD-10-CM

## 2016-06-16 DIAGNOSIS — Z23 Encounter for immunization: Secondary | ICD-10-CM

## 2016-06-16 LAB — PULMONARY FUNCTION TEST
DL/VA % pred: 104 %
DL/VA: 4.58 ml/min/mmHg/L
DLCO COR: 15.02 ml/min/mmHg
DLCO UNC % PRED: 74 %
DLCO UNC: 15.11 ml/min/mmHg
DLCO cor % pred: 74 %
FEF 25-75 PRE: 1.2 L/s
FEF 25-75 Post: 2.18 L/sec
FEF2575-%Change-Post: 80 %
FEF2575-%PRED-POST: 156 %
FEF2575-%PRED-PRE: 86 %
FEV1-%Change-Post: 14 %
FEV1-%PRED-POST: 92 %
FEV1-%Pred-Pre: 81 %
FEV1-PRE: 1.43 L
FEV1-Post: 1.64 L
FEV1FVC-%Change-Post: 9 %
FEV1FVC-%PRED-PRE: 103 %
FEV6-%CHANGE-POST: 4 %
FEV6-%PRED-POST: 87 %
FEV6-%Pred-Pre: 83 %
FEV6-POST: 1.96 L
FEV6-Pre: 1.86 L
FEV6FVC-%PRED-POST: 105 %
FEV6FVC-%Pred-Pre: 105 %
FVC-%Change-Post: 4 %
FVC-%Pred-Post: 82 %
FVC-%Pred-Pre: 78 %
FVC-Post: 1.96 L
FVC-Pre: 1.86 L
PRE FEV1/FVC RATIO: 77 %
PRE FEV6/FVC RATIO: 100 %
Post FEV1/FVC ratio: 84 %
Post FEV6/FVC ratio: 100 %
RV % PRED: 97 %
RV: 2.13 L
TLC % pred: 84 %
TLC: 3.89 L

## 2016-06-16 NOTE — Patient Instructions (Signed)
   Remember to start using your Asmanex inhaler in October to help head off any problems from your asthma.  Call me if you have any new breathing problems before your next appointment.  Let your cancer doctor know that you got your flu shot today.  I will see you back in 6 months or sooner if needed.
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
## 2016-06-16 NOTE — Progress Notes (Signed)
Test reviewed.  

## 2016-06-17 DIAGNOSIS — M7061 Trochanteric bursitis, right hip: Secondary | ICD-10-CM | POA: Diagnosis not present

## 2016-06-17 DIAGNOSIS — M25551 Pain in right hip: Secondary | ICD-10-CM | POA: Diagnosis not present

## 2016-06-21 DIAGNOSIS — M25551 Pain in right hip: Secondary | ICD-10-CM | POA: Diagnosis not present

## 2016-06-28 DIAGNOSIS — D472 Monoclonal gammopathy: Secondary | ICD-10-CM | POA: Diagnosis not present

## 2016-06-28 DIAGNOSIS — Z5112 Encounter for antineoplastic immunotherapy: Secondary | ICD-10-CM | POA: Diagnosis not present

## 2016-06-28 DIAGNOSIS — C9000 Multiple myeloma not having achieved remission: Secondary | ICD-10-CM | POA: Diagnosis not present

## 2016-06-28 DIAGNOSIS — R319 Hematuria, unspecified: Secondary | ICD-10-CM | POA: Diagnosis not present

## 2016-06-28 DIAGNOSIS — Z885 Allergy status to narcotic agent status: Secondary | ICD-10-CM | POA: Diagnosis not present

## 2016-07-01 DIAGNOSIS — M25551 Pain in right hip: Secondary | ICD-10-CM | POA: Diagnosis not present

## 2016-07-08 DIAGNOSIS — N94810 Vulvar vestibulitis: Secondary | ICD-10-CM | POA: Diagnosis not present

## 2016-07-08 DIAGNOSIS — Z6828 Body mass index (BMI) 28.0-28.9, adult: Secondary | ICD-10-CM | POA: Diagnosis not present

## 2016-07-08 DIAGNOSIS — Z779 Other contact with and (suspected) exposures hazardous to health: Secondary | ICD-10-CM | POA: Diagnosis not present

## 2016-07-12 DIAGNOSIS — Z6829 Body mass index (BMI) 29.0-29.9, adult: Secondary | ICD-10-CM | POA: Diagnosis not present

## 2016-07-12 DIAGNOSIS — C9000 Multiple myeloma not having achieved remission: Secondary | ICD-10-CM | POA: Diagnosis not present

## 2016-07-12 DIAGNOSIS — Z923 Personal history of irradiation: Secondary | ICD-10-CM | POA: Diagnosis not present

## 2016-07-12 DIAGNOSIS — M79671 Pain in right foot: Secondary | ICD-10-CM | POA: Diagnosis not present

## 2016-07-12 DIAGNOSIS — D472 Monoclonal gammopathy: Secondary | ICD-10-CM | POA: Diagnosis not present

## 2016-07-12 DIAGNOSIS — M79672 Pain in left foot: Secondary | ICD-10-CM | POA: Diagnosis not present

## 2016-07-12 DIAGNOSIS — M79606 Pain in leg, unspecified: Secondary | ICD-10-CM | POA: Diagnosis not present

## 2016-07-12 DIAGNOSIS — Z8673 Personal history of transient ischemic attack (TIA), and cerebral infarction without residual deficits: Secondary | ICD-10-CM | POA: Diagnosis not present

## 2016-07-12 DIAGNOSIS — Z79899 Other long term (current) drug therapy: Secondary | ICD-10-CM | POA: Diagnosis not present

## 2016-07-12 DIAGNOSIS — R2242 Localized swelling, mass and lump, left lower limb: Secondary | ICD-10-CM | POA: Diagnosis not present

## 2016-07-12 DIAGNOSIS — Z7951 Long term (current) use of inhaled steroids: Secondary | ICD-10-CM | POA: Diagnosis not present

## 2016-07-12 DIAGNOSIS — M79669 Pain in unspecified lower leg: Secondary | ICD-10-CM | POA: Diagnosis not present

## 2016-07-12 DIAGNOSIS — R600 Localized edema: Secondary | ICD-10-CM | POA: Diagnosis not present

## 2016-07-12 DIAGNOSIS — L0292 Furuncle, unspecified: Secondary | ICD-10-CM | POA: Diagnosis not present

## 2016-07-12 DIAGNOSIS — Z7982 Long term (current) use of aspirin: Secondary | ICD-10-CM | POA: Diagnosis not present

## 2016-07-12 DIAGNOSIS — Z5112 Encounter for antineoplastic immunotherapy: Secondary | ICD-10-CM | POA: Diagnosis not present

## 2016-07-15 DIAGNOSIS — N94818 Other vulvodynia: Secondary | ICD-10-CM | POA: Diagnosis not present

## 2016-07-15 DIAGNOSIS — Z6829 Body mass index (BMI) 29.0-29.9, adult: Secondary | ICD-10-CM | POA: Diagnosis not present

## 2016-07-17 ENCOUNTER — Other Ambulatory Visit: Payer: Self-pay | Admitting: Critical Care Medicine

## 2016-07-19 DIAGNOSIS — N9489 Other specified conditions associated with female genital organs and menstrual cycle: Secondary | ICD-10-CM | POA: Diagnosis not present

## 2016-07-19 DIAGNOSIS — R300 Dysuria: Secondary | ICD-10-CM | POA: Diagnosis not present

## 2016-07-19 DIAGNOSIS — N952 Postmenopausal atrophic vaginitis: Secondary | ICD-10-CM | POA: Diagnosis not present

## 2016-07-23 ENCOUNTER — Other Ambulatory Visit: Payer: Self-pay

## 2016-07-23 MED ORDER — FLUTICASONE PROPIONATE 50 MCG/ACT NA SUSP: 2.0000 | Freq: Every day | NASAL | 6 refills | Status: DC

## 2016-07-26 DIAGNOSIS — Z6828 Body mass index (BMI) 28.0-28.9, adult: Secondary | ICD-10-CM | POA: Diagnosis not present

## 2016-07-26 DIAGNOSIS — Z79899 Other long term (current) drug therapy: Secondary | ICD-10-CM | POA: Diagnosis not present

## 2016-07-26 DIAGNOSIS — Z885 Allergy status to narcotic agent status: Secondary | ICD-10-CM | POA: Diagnosis not present

## 2016-07-26 DIAGNOSIS — H54415A Blindness right eye category 5, normal vision left eye: Secondary | ICD-10-CM | POA: Diagnosis not present

## 2016-07-26 DIAGNOSIS — Z923 Personal history of irradiation: Secondary | ICD-10-CM | POA: Diagnosis not present

## 2016-07-26 DIAGNOSIS — Z5112 Encounter for antineoplastic immunotherapy: Secondary | ICD-10-CM | POA: Diagnosis not present

## 2016-07-26 DIAGNOSIS — C9000 Multiple myeloma not having achieved remission: Secondary | ICD-10-CM | POA: Diagnosis not present

## 2016-07-26 DIAGNOSIS — Z888 Allergy status to other drugs, medicaments and biological substances status: Secondary | ICD-10-CM | POA: Diagnosis not present

## 2016-07-26 DIAGNOSIS — Z7982 Long term (current) use of aspirin: Secondary | ICD-10-CM | POA: Diagnosis not present

## 2016-07-26 DIAGNOSIS — Z8673 Personal history of transient ischemic attack (TIA), and cerebral infarction without residual deficits: Secondary | ICD-10-CM | POA: Diagnosis not present

## 2016-07-26 DIAGNOSIS — Z7951 Long term (current) use of inhaled steroids: Secondary | ICD-10-CM | POA: Diagnosis not present

## 2016-07-31 DIAGNOSIS — N760 Acute vaginitis: Secondary | ICD-10-CM | POA: Diagnosis not present

## 2016-07-31 DIAGNOSIS — I10 Essential (primary) hypertension: Secondary | ICD-10-CM | POA: Diagnosis not present

## 2016-08-02 DIAGNOSIS — N182 Chronic kidney disease, stage 2 (mild): Secondary | ICD-10-CM | POA: Diagnosis not present

## 2016-08-02 DIAGNOSIS — I129 Hypertensive chronic kidney disease with stage 1 through stage 4 chronic kidney disease, or unspecified chronic kidney disease: Secondary | ICD-10-CM | POA: Diagnosis not present

## 2016-08-05 DIAGNOSIS — H04123 Dry eye syndrome of bilateral lacrimal glands: Secondary | ICD-10-CM | POA: Diagnosis not present

## 2016-08-09 DIAGNOSIS — R3129 Other microscopic hematuria: Secondary | ICD-10-CM | POA: Diagnosis not present

## 2016-08-09 DIAGNOSIS — C9000 Multiple myeloma not having achieved remission: Secondary | ICD-10-CM | POA: Diagnosis not present

## 2016-08-09 DIAGNOSIS — Z5111 Encounter for antineoplastic chemotherapy: Secondary | ICD-10-CM | POA: Diagnosis not present

## 2016-08-09 DIAGNOSIS — N952 Postmenopausal atrophic vaginitis: Secondary | ICD-10-CM | POA: Diagnosis not present

## 2016-08-11 DIAGNOSIS — B373 Candidiasis of vulva and vagina: Secondary | ICD-10-CM | POA: Diagnosis not present

## 2016-08-17 DIAGNOSIS — R319 Hematuria, unspecified: Secondary | ICD-10-CM | POA: Diagnosis not present

## 2016-08-17 DIAGNOSIS — Z6827 Body mass index (BMI) 27.0-27.9, adult: Secondary | ICD-10-CM | POA: Diagnosis not present

## 2016-08-17 DIAGNOSIS — R3989 Other symptoms and signs involving the genitourinary system: Secondary | ICD-10-CM | POA: Diagnosis not present

## 2016-08-19 DIAGNOSIS — R6889 Other general symptoms and signs: Secondary | ICD-10-CM | POA: Diagnosis not present

## 2016-08-19 DIAGNOSIS — C9000 Multiple myeloma not having achieved remission: Secondary | ICD-10-CM | POA: Diagnosis not present

## 2016-08-19 DIAGNOSIS — D472 Monoclonal gammopathy: Secondary | ICD-10-CM | POA: Diagnosis not present

## 2016-08-23 DIAGNOSIS — C9000 Multiple myeloma not having achieved remission: Secondary | ICD-10-CM | POA: Diagnosis not present

## 2016-08-23 DIAGNOSIS — Z888 Allergy status to other drugs, medicaments and biological substances status: Secondary | ICD-10-CM | POA: Diagnosis not present

## 2016-08-23 DIAGNOSIS — Z79899 Other long term (current) drug therapy: Secondary | ICD-10-CM | POA: Diagnosis not present

## 2016-08-23 DIAGNOSIS — H547 Unspecified visual loss: Secondary | ICD-10-CM | POA: Diagnosis not present

## 2016-08-23 DIAGNOSIS — Z5112 Encounter for antineoplastic immunotherapy: Secondary | ICD-10-CM | POA: Diagnosis not present

## 2016-08-23 DIAGNOSIS — Z7982 Long term (current) use of aspirin: Secondary | ICD-10-CM | POA: Diagnosis not present

## 2016-08-23 DIAGNOSIS — Z7951 Long term (current) use of inhaled steroids: Secondary | ICD-10-CM | POA: Diagnosis not present

## 2016-08-23 DIAGNOSIS — Z923 Personal history of irradiation: Secondary | ICD-10-CM | POA: Diagnosis not present

## 2016-08-23 DIAGNOSIS — D472 Monoclonal gammopathy: Secondary | ICD-10-CM | POA: Diagnosis not present

## 2016-08-23 DIAGNOSIS — Z885 Allergy status to narcotic agent status: Secondary | ICD-10-CM | POA: Diagnosis not present

## 2016-08-23 DIAGNOSIS — Z6827 Body mass index (BMI) 27.0-27.9, adult: Secondary | ICD-10-CM | POA: Diagnosis not present

## 2016-08-23 DIAGNOSIS — Z91040 Latex allergy status: Secondary | ICD-10-CM | POA: Diagnosis not present

## 2016-08-23 DIAGNOSIS — Z881 Allergy status to other antibiotic agents status: Secondary | ICD-10-CM | POA: Diagnosis not present

## 2016-08-31 DIAGNOSIS — F439 Reaction to severe stress, unspecified: Secondary | ICD-10-CM | POA: Diagnosis not present

## 2016-08-31 DIAGNOSIS — N949 Unspecified condition associated with female genital organs and menstrual cycle: Secondary | ICD-10-CM | POA: Diagnosis not present

## 2016-09-03 DIAGNOSIS — H01002 Unspecified blepharitis right lower eyelid: Secondary | ICD-10-CM | POA: Diagnosis not present

## 2016-09-03 DIAGNOSIS — H01001 Unspecified blepharitis right upper eyelid: Secondary | ICD-10-CM | POA: Diagnosis not present

## 2016-09-03 DIAGNOSIS — H01005 Unspecified blepharitis left lower eyelid: Secondary | ICD-10-CM | POA: Diagnosis not present

## 2016-09-03 DIAGNOSIS — H01004 Unspecified blepharitis left upper eyelid: Secondary | ICD-10-CM | POA: Diagnosis not present

## 2016-09-03 DIAGNOSIS — H04123 Dry eye syndrome of bilateral lacrimal glands: Secondary | ICD-10-CM | POA: Diagnosis not present

## 2016-09-04 DIAGNOSIS — H31091 Other chorioretinal scars, right eye: Secondary | ICD-10-CM | POA: Diagnosis not present

## 2016-09-04 DIAGNOSIS — H35362 Drusen (degenerative) of macula, left eye: Secondary | ICD-10-CM | POA: Diagnosis not present

## 2016-09-04 DIAGNOSIS — H353121 Nonexudative age-related macular degeneration, left eye, early dry stage: Secondary | ICD-10-CM | POA: Diagnosis not present

## 2016-09-04 DIAGNOSIS — C6930 Malignant neoplasm of unspecified choroid: Secondary | ICD-10-CM | POA: Diagnosis not present

## 2016-09-04 DIAGNOSIS — H10012 Acute follicular conjunctivitis, left eye: Secondary | ICD-10-CM | POA: Diagnosis not present

## 2016-09-06 DIAGNOSIS — C9000 Multiple myeloma not having achieved remission: Secondary | ICD-10-CM | POA: Diagnosis not present

## 2016-09-06 DIAGNOSIS — Z5111 Encounter for antineoplastic chemotherapy: Secondary | ICD-10-CM | POA: Diagnosis not present

## 2016-09-07 DIAGNOSIS — H01005 Unspecified blepharitis left lower eyelid: Secondary | ICD-10-CM | POA: Diagnosis not present

## 2016-09-07 DIAGNOSIS — H01002 Unspecified blepharitis right lower eyelid: Secondary | ICD-10-CM | POA: Diagnosis not present

## 2016-09-07 DIAGNOSIS — H04123 Dry eye syndrome of bilateral lacrimal glands: Secondary | ICD-10-CM | POA: Diagnosis not present

## 2016-09-07 DIAGNOSIS — H01004 Unspecified blepharitis left upper eyelid: Secondary | ICD-10-CM | POA: Diagnosis not present

## 2016-09-07 DIAGNOSIS — H01001 Unspecified blepharitis right upper eyelid: Secondary | ICD-10-CM | POA: Diagnosis not present

## 2016-09-10 ENCOUNTER — Emergency Department (HOSPITAL_COMMUNITY)
Admission: EM | Admit: 2016-09-10 | Discharge: 2016-09-10 | Disposition: A | Discharge status: Home / Self Care | Payer: Medicare Other | Attending: Emergency Medicine | Admitting: Emergency Medicine

## 2016-09-10 ENCOUNTER — Encounter (HOSPITAL_COMMUNITY): Payer: Self-pay

## 2016-09-10 ENCOUNTER — Emergency Department (HOSPITAL_COMMUNITY): Payer: Medicare Other

## 2016-09-10 DIAGNOSIS — Z79899 Other long term (current) drug therapy: Secondary | ICD-10-CM | POA: Diagnosis not present

## 2016-09-10 DIAGNOSIS — J45909 Unspecified asthma, uncomplicated: Secondary | ICD-10-CM | POA: Diagnosis not present

## 2016-09-10 DIAGNOSIS — Z85828 Personal history of other malignant neoplasm of skin: Secondary | ICD-10-CM | POA: Insufficient documentation

## 2016-09-10 DIAGNOSIS — W268XXA Contact with other sharp object(s), not elsewhere classified, initial encounter: Secondary | ICD-10-CM | POA: Diagnosis not present

## 2016-09-10 DIAGNOSIS — Z7722 Contact with and (suspected) exposure to environmental tobacco smoke (acute) (chronic): Secondary | ICD-10-CM | POA: Diagnosis not present

## 2016-09-10 DIAGNOSIS — Z91040 Latex allergy status: Secondary | ICD-10-CM | POA: Insufficient documentation

## 2016-09-10 DIAGNOSIS — Z7982 Long term (current) use of aspirin: Secondary | ICD-10-CM | POA: Insufficient documentation

## 2016-09-10 DIAGNOSIS — L089 Local infection of the skin and subcutaneous tissue, unspecified: Secondary | ICD-10-CM

## 2016-09-10 DIAGNOSIS — Y929 Unspecified place or not applicable: Secondary | ICD-10-CM | POA: Diagnosis not present

## 2016-09-10 DIAGNOSIS — S60942A Unspecified superficial injury of right middle finger, initial encounter: Secondary | ICD-10-CM | POA: Diagnosis not present

## 2016-09-10 DIAGNOSIS — Y999 Unspecified external cause status: Secondary | ICD-10-CM | POA: Insufficient documentation

## 2016-09-10 DIAGNOSIS — S61212A Laceration without foreign body of right middle finger without damage to nail, initial encounter: Secondary | ICD-10-CM | POA: Diagnosis not present

## 2016-09-10 DIAGNOSIS — Y939 Activity, unspecified: Secondary | ICD-10-CM | POA: Insufficient documentation

## 2016-09-10 DIAGNOSIS — Z8673 Personal history of transient ischemic attack (TIA), and cerebral infarction without residual deficits: Secondary | ICD-10-CM | POA: Insufficient documentation

## 2016-09-10 DIAGNOSIS — S6991XA Unspecified injury of right wrist, hand and finger(s), initial encounter: Secondary | ICD-10-CM | POA: Diagnosis present

## 2016-09-10 DIAGNOSIS — I129 Hypertensive chronic kidney disease with stage 1 through stage 4 chronic kidney disease, or unspecified chronic kidney disease: Secondary | ICD-10-CM | POA: Diagnosis not present

## 2016-09-10 DIAGNOSIS — N189 Chronic kidney disease, unspecified: Secondary | ICD-10-CM | POA: Insufficient documentation

## 2016-09-10 DIAGNOSIS — E039 Hypothyroidism, unspecified: Secondary | ICD-10-CM | POA: Insufficient documentation

## 2016-09-10 DIAGNOSIS — M7989 Other specified soft tissue disorders: Secondary | ICD-10-CM | POA: Diagnosis not present

## 2016-09-10 LAB — BASIC METABOLIC PANEL
Anion gap: 9 (ref 5–15)
BUN: 9 mg/dL (ref 6–20)
CO2: 26 mmol/L (ref 22–32)
Calcium: 9.4 mg/dL (ref 8.9–10.3)
Chloride: 106 mmol/L (ref 101–111)
Creatinine, Ser: 0.63 mg/dL (ref 0.44–1.00)
GFR calc Af Amer: >60 mL/min (ref >60)
GFR calc non Af Amer: >60 mL/min (ref >60)
Glucose, Bld: 95 mg/dL (ref 65–99)
Potassium: 3.4 mmol/L — ABNORMAL LOW (ref 3.5–5.1)
Sodium: 141 mmol/L (ref 135–145)

## 2016-09-10 LAB — CBC WITH DIFFERENTIAL/PLATELET
Basophils Absolute: 0 10*3/uL (ref 0.0–0.1)
Basophils Relative: 0 %
Eosinophils Absolute: 0.1 10*3/uL (ref 0.0–0.7)
Eosinophils Relative: 1 %
HCT: 39.1 % (ref 36.0–46.0)
Hemoglobin: 13 g/dL (ref 12.0–15.0)
Lymphocytes Relative: 23 %
Lymphs Abs: 1.9 10*3/uL (ref 0.7–4.0)
MCH: 29.4 pg (ref 26.0–34.0)
MCHC: 33.2 g/dL (ref 30.0–36.0)
MCV: 88.5 fL (ref 78.0–100.0)
Monocytes Absolute: 0.7 10*3/uL (ref 0.1–1.0)
Monocytes Relative: 8 %
Neutro Abs: 5.5 10*3/uL (ref 1.7–7.7)
Neutrophils Relative %: 68 %
Platelets: 226 10*3/uL (ref 150–400)
RBC: 4.42 MIL/uL (ref 3.87–5.11)
RDW: 12.9 % (ref 11.5–15.5)
WBC: 8.2 10*3/uL (ref 4.0–10.5)

## 2016-09-10 MED ORDER — SODIUM CHLORIDE 0.9 % IV SOLN
3.0000 g | Freq: Once | INTRAVENOUS | Status: AC
  Administered 2016-09-10: 3 g via INTRAVENOUS
  Filled 2016-09-10: qty 3

## 2016-09-10 MED ORDER — AMOXICILLIN-POT CLAVULANATE 875-125 MG PO TABS: 1.0000 | ORAL_TABLET | Freq: Two times a day (BID) | ORAL | 0 refills | Status: DC

## 2016-09-10 NOTE — ED Triage Notes (Signed)
Pt. Cut her rt. 3rd finger a few days ago and it has been swollen and red.    Pt. Takes chemo and she wants to make sure she does not have an infection.  +ROM to the finger

## 2016-09-10 NOTE — ED Notes (Signed)
Papers reviewed and I spoke with her daughter in law about what needed to happen at home. She verbalizes understanding.

## 2016-09-10 NOTE — ED Provider Notes (Addendum)
[facility] DEPT Provider Note   CSN: [id number] Arrival date & time: 09/10/16  1113     History   Chief Complaint Chief Complaint  Patient presents with  . Finger Injury    HPI [name] is a 75 y.o. female.  75yo F PMH including MM on chemo, HTN, HLD R finger injury. 4-5 days ago, the patient cut her R 3rd finger. It initially bled a lot but eventually stopped bleeding. Over the past few days, she has had progressively worsening swelling, redness, and pain of her finger and worsening rash around the site of the cut. She denies any fevers or recent illness. She has put a cream on the cut a few times in the past few days. She states she is up to date on vaccinations.   The history is provided by the patient.    Past Medical History:  Diagnosis Date  . Anxiety   . Arthritis   . Asthma   . Cancer (HCC)    THYROID, SKIN, NOSE, BONE  . Chronic kidney disease   . Diverticulosis    Pt reported on 03/15/12  . Eye problems    LOST VISION RIGHT EYE  . Hernia   . History of blood clots    LEG  . Hyperlipidemia   . Hypertension   . Melanoma ([place])   . Melanoma ([place]) 1989   chest  . MGUS (monoclonal gammopathy of unknown significance)   . MGUS (monoclonal gammopathy of unknown significance)   . Mitral valve prolapse   . Perforated bowel ([place])   . Peritonitis ([place])   . Stroke ([place])   . Thyroid disease     Patient Active Problem List   Diagnosis Date Noted  . Periorbital edema 01/22/2015  . Allergic rhinitis 08/12/2014  . Anxiety 07/04/2014  . 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[place]) 07/04/2014  . Multiple myeloma without remission ([place]) 11/01/2013  . Cerebral vascular accident ([place]) 02/19/2013  . Bloodgood disease 02/19/2013  . Acid reflux 02/19/2013  . Blood in the urine 02/19/2013  . Billowing mitral valve 02/19/2013  . Adenomatous colon polyp 02/19/2013  . 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 Cataract, nuclear 12/16/2011  . Intragel vitreous  hemorrhage ([place]) 12/16/2011  . Age-related macular degeneration, dry 12/02/2011  . Post-radiation retinopathy 12/02/2011  . Pseudophakia 12/02/2011  . MGUS (monoclonal gammopathy of unknown significance) 11/26/2011  . 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 Extrinsic asthma 09/21/2011  . Colon, diverticulosis 07/26/2011  . Benign hypertensive heart disease without heart failure 03/31/2011  . Hypercholesterolemia 03/31/2011  . Hypothyroidism 03/31/2011  . History of melanoma 03/31/2011  . Hx of thyroid cancer 03/31/2011  . H/O malignant melanoma of skin 03/31/2011    Past Surgical History:  Procedure Laterality Date  . ABDOMINAL HYSTERECTOMY    . BREAST LUMPECTOMY  12/2010   R breast  . BREAST LUMPECTOMY  2004   L axilla neg for cancer.  [name] CARDIOVASCULAR STRESS TEST  2006   NORMAL  . HERNIA REPAIR    . LEG SURGERY     VEIN & BLOOD CLOT  . MELANOMA EXCISION     R eye  . ROTATOR CUFF REPAIR  2004   RIGHT SHOULDER  . SKIN CANCER EXCISION  2011, 2013   squamous cell of nose x 2  . TRANSTHORACIC ECHOCARDIOGRAM  2006    OB History    No data available       Home Medications    Prior to Admission  medications   Medication Sig Start Date End Date Taking? Authorizing Provider  acetaminophen (TYLENOL) 500 MG tablet Take 250 mg by mouth every 6 (six) hours as needed for moderate pain.   Yes Historical Provider, MD  albuterol (VENTOLIN HFA) 108 (90 BASE) MCG/ACT inhaler Inhale 2 puffs into the lungs every 6 (six) hours as needed for wheezing or shortness of breath.    Yes Historical Provider, MD  ALPRAZolam Duanne Moron) 0.5 MG tablet Take 0.5 mg by mouth daily as needed for anxiety.  07/26/11  Yes Historical Provider, MD  amLODipine (NORVASC) 5 MG tablet Take 1 tablet (5 mg total) by mouth daily. 06/11/16  Yes Burtis Junes, NP  aspirin 81 MG chewable tablet Chew 81 mg by mouth at bedtime.  07/26/11  Yes Historical Provider, MD  clonazePAM (KLONOPIN) 0.5 MG tablet Take  0.25-0.5 mg by mouth at bedtime as needed for anxiety.  05/06/12  Yes Historical Provider, MD  fexofenadine (ALLEGRA) 180 MG tablet Take 180 mg by mouth daily.    Yes Historical Provider, MD  fluticasone (FLONASE) 50 MCG/ACT nasal spray Place 1 spray into both nostrils 2 (two) times daily. 07/09/15  Yes Javier Glazier, MD  levothyroxine (SYNTHROID, LEVOTHROID) 112 MCG tablet Take 112 mcg by mouth daily before breakfast. Once daily 11/13/15  Yes Historical Provider, MD  MICARDIS 40 MG tablet Take 1 tablet (40 mg total) by mouth daily. 11/27/12  Yes Darlin Coco, MD  mometasone [facility]) 220 MCG/INH inhaler Inhale 2 puffs into the lungs daily as needed (shortness of breath).    Yes Historical Provider, MD  Multiple Vitamins-Minerals (PRESERVISION AREDS PO) Take 1 capsule by mouth 2 (two) times daily.   Yes Historical Provider, MD  neomycin-polymyxin-dexameth (MAXITROL) 0.1 % OINT Place 1 application into the left eye 2 (two) times daily.   Yes Historical Provider, MD  nystatin ointment (MYCOSTATIN) Apply 1 application topically 2 (two) times daily. As directed 11/09/15  Yes Historical Provider, MD  Olopatadine HCl 0.7 % SOLN Place 1 drop into the left eye daily. Reported on 12/02/2015   Yes Historical Provider, MD  simvastatin (ZOCOR) 20 MG tablet Take 1 tablet (20 mg total) by mouth every evening. 12/10/15  Yes Burtis Junes, NP  valACYclovir (VALTREX) 500 MG tablet Take 1 tablet by mouth daily.  06/23/15  Yes Historical Provider, MD  fluticasone (FLONASE) 50 MCG/ACT nasal spray Place 2 sprays into both nostrils daily. Patient not taking: Reported on 09/10/2016 07/23/16   Javier Glazier, MD    Family History Family History  Problem Relation Age of Onset  . Asthma Father   . Diabetes Father   . Heart failure Father   . Cancer Father     Prostate and kidney cancer  . Emphysema Father   . Diabetes Sister   . Cancer Sister   . Stroke Mother 58    Her mother passed away from this stroke at the  age of 62  . Cancer Brother     brain cancer & prostate cancer  . Cervical cancer Sister   . Cancer Sister     Recurrent cancer involving the neck and the jaw    Social History Social History  Substance Use Topics  . Smoking status: Passive Smoke Exposure - Never Smoker  . Smokeless tobacco: Never Used     Comment: Exposure rarely through parents but significant exposure at work  . Alcohol use No     Allergies   Decadrol [dexamethasone]; Doxycycline; Fluconazole; Lipitor [atorvastatin];  Aldactone [spironolactone]; Hydrocodone; Levofloxacin; Losartan; Prednisone; Pseudoephedrine; Quinolones; Sertraline; Sertraline hcl; Sudafed [pseudoephedrine hcl]; Trazodone and nefazodone; Ultram [tramadol]; Adhesive [tape]; Hydrocodone-acetaminophen; and Latex   Review of Systems Review of Systems 10 Systems reviewed and are negative for acute change except as noted in the HPI.   Physical Exam Updated Vital Signs BP 118/70   Pulse 73   Temp 97.7 F (36.5 C) (Oral)   Resp 18   Ht 5' 1"  (1.549 m)   Wt 144 lb (65.3 kg)   SpO2 98%   BMI 27.21 kg/m   Physical Exam  Constitutional: She is oriented to person, place, and time. She appears well-developed and well-nourished. No distress.  HENT:  Head: Normocephalic and atraumatic.  Moist mucous membranes  Eyes: Conjunctivae are normal. Pupils are equal, round, and reactive to light.  Neck: Neck supple.  Cardiovascular: Normal rate, regular rhythm and normal heart sounds.   No murmur heard. Pulmonary/Chest: Effort normal and breath sounds normal.  Abdominal: Soft. Bowel sounds are normal. She exhibits no distension. There is no tenderness.  Musculoskeletal: She exhibits edema (R middle finger).  R finger held in partial flexion, mild tenderness from PIP joint distally with limited flexion at PIP joint  Neurological: She is alert and oriented to person, place, and time.  Fluent speech  Skin: Skin is warm and dry.  Small vesicles  overlying healing laceration on R middle finger with mild erythema around wound (see photo)  Psychiatric: Judgment normal.  anxious  Nursing note and vitals reviewed.          ED Treatments / Results  Labs (all labs ordered are listed, but only abnormal results are displayed) Labs Reviewed  BASIC METABOLIC PANEL - Abnormal; Notable for the following:       Result Value   Potassium 3.4 (*)    All other components within normal limits  CBC WITH DIFFERENTIAL/PLATELET    EKG  EKG Interpretation None       Radiology Dg Finger Middle Right  Result Date: 09/10/2016 CLINICAL DATA:  Swelling with redness and blisters involving the middle finger. EXAM: RIGHT MIDDLE FINGER 2+V COMPARISON:  01/27/2010 FINDINGS: Again noted is severe joint space loss involving the middle finger PIP joint with osteophytosis. There has been progression of joint space narrowing in the middle finger DIP joint with osteophytosis. Negative for an acute fracture. Mild diffuse swelling throughout the soft tissues. IMPRESSION: Severe joint space loss and osteophytosis involving the middle finger DIP and PIP joints. There has been progression of the disease since 2011. Radiographic findings are most suggestive for osteoarthritis. Electronically Signed   By: Markus Daft M.D.   On: 09/10/2016 12:56    Procedures Procedures (including critical care time)  Medications Ordered in ED Medications - No data to display   Initial Impression / Assessment and Plan / ED Course  I have reviewed the triage vital signs and the nursing notes.  Pertinent labs & imaging results that were available during my care of the patient were reviewed by me and considered in my medical decision making (see chart for details).  Clinical Course     Pt w/ a few days of R middle finger swelling and pain  After cutting finger 4-5 days ago. Well-appearing on exam with normal vital signs. She did have a small area of vesicular rash overlying  the healing cut, w/ uniform swelling of digit. Her lab work here is unremarkable and shows normal WBC count, no neutropenia. I discussed at length with Dr.  Grandville Silos, hand surgery, and I appreciate his assistance with the patient's care. He reviewed the photographs and stated that the rash looks similar to a contact dermatitis and I agree. The patient has been putting some sort of cream on the area and it is possible that she has had a resultant contact her otitis. She takes valacyclovir chronically while on chemotherapy and based on appearance I feel herpetic witlow is unlikely. I am concerned about bacterial infection. Dr. Grandville Silos recommended a dose of Unasyn here and discharge with Augmentin and plans to follow-up in 2 days here in the ED because of the weekend. Instructed patient to return here in 48 hours at 8 AM, NPO after MN, in case surgical intervention is needed per Dr. Grandville Silos. Instructed her to return sooner if any of her symptoms seemed to worsen. She is otherwise well-appearing and the rest of her exam is reassuring. I reviewed this plan multiple times with the patient and wrote it on her discharge paperwork. She voiced understanding and was discharged in satisfactory condition.  Final Clinical Impressions(s) / ED Diagnoses   Final diagnoses:  Superficial injury of right middle finger with infection    New Prescriptions New Prescriptions   No medications on file     Sharlett Iles, MD 09/10/16 [place], MD 09/10/16 [phone]

## 2016-09-10 NOTE — ED Notes (Signed)
Pt transported to x-ray.  They know to take her to A2 when done.

## 2016-09-10 NOTE — ED Notes (Signed)
MD Little at the bedside  

## 2016-09-10 NOTE — ED Provider Notes (Signed)
By signing my name below, I, Delton Prairie, attest that this documentation has been prepared under the direction and in the presence of  [employer], PA-C. Electronically Signed: Delton Prairie, ED Scribe. 09/10/16. 12:14 PM.  HPI Comments:  [name] is a 75 y.o. female, with a hx of multiple myeloma, who presents to the Emergency Department complaining of sudden onset, worsening right 3rd digit pain x several days. She states she must have cut herself with an unknown object without her knowledge. Pt has cleaned the area with peroxide and epsom salts with no relied. She states she is on valtrex to prevent shingles  which she has not taken in 2 days. Patient receiving Velcade injections as chemotherapy. Pt denies a hx of eczema, any other associated symptoms and modifying factors at this time.   Physical Exam: significant swelling to the right third digit with what appears to be vesicular lesions extending into the interdigit spaces   I personally performed the services described in this documentation, which was scribed in my presence. The recorded information has been reviewed and is accurate.  Patient seen and screen in [facility], she will have basic laboratory analysis x-ray and transferred to acute side for further evaluation and management. She is afebrile nontoxic in no acute distress.   Okey Regal, PA-C 09/10/16 [place], MD 09/10/16 [phone]

## 2016-09-10 NOTE — Discharge Instructions (Signed)
START TAKING AUGMENTIN AS DIRECTED ON PRESCRIPTION. RETURN HERE TO THE EMERGENCY DEPARTMENT IN 2 DAYS (Sunday) AT 8AM FOR RE-EVALUATION. DO NOT EAT OR DRINK ANYTHING AFTER Saturday NIGHT AT MIDNIGHT IN CASE YOU NEED SURGERY. IF ANY OF YOUR SYMPTOMS WORSEN, RETURN IMMEDIATELY.

## 2016-09-10 NOTE — ED Notes (Signed)
Pt off floor at xray

## 2016-09-12 ENCOUNTER — Emergency Department (HOSPITAL_COMMUNITY)
Admission: EM | Admit: 2016-09-12 | Discharge: 2016-09-12 | Disposition: A | Discharge status: Home / Self Care | Payer: Medicare Other | Attending: Emergency Medicine | Admitting: Emergency Medicine

## 2016-09-12 DIAGNOSIS — Z7982 Long term (current) use of aspirin: Secondary | ICD-10-CM | POA: Insufficient documentation

## 2016-09-12 DIAGNOSIS — Z7722 Contact with and (suspected) exposure to environmental tobacco smoke (acute) (chronic): Secondary | ICD-10-CM | POA: Insufficient documentation

## 2016-09-12 DIAGNOSIS — N189 Chronic kidney disease, unspecified: Secondary | ICD-10-CM | POA: Insufficient documentation

## 2016-09-12 DIAGNOSIS — J45909 Unspecified asthma, uncomplicated: Secondary | ICD-10-CM | POA: Insufficient documentation

## 2016-09-12 DIAGNOSIS — I1310 Hypertensive heart and chronic kidney disease without heart failure, with stage 1 through stage 4 chronic kidney disease, or unspecified chronic kidney disease: Secondary | ICD-10-CM | POA: Insufficient documentation

## 2016-09-12 DIAGNOSIS — Z4801 Encounter for change or removal of surgical wound dressing: Secondary | ICD-10-CM | POA: Insufficient documentation

## 2016-09-12 DIAGNOSIS — E039 Hypothyroidism, unspecified: Secondary | ICD-10-CM | POA: Diagnosis not present

## 2016-09-12 DIAGNOSIS — Z85850 Personal history of malignant neoplasm of thyroid: Secondary | ICD-10-CM | POA: Insufficient documentation

## 2016-09-12 DIAGNOSIS — Z85828 Personal history of other malignant neoplasm of skin: Secondary | ICD-10-CM | POA: Insufficient documentation

## 2016-09-12 DIAGNOSIS — Z4800 Encounter for change or removal of nonsurgical wound dressing: Secondary | ICD-10-CM | POA: Diagnosis not present

## 2016-09-12 DIAGNOSIS — Z8673 Personal history of transient ischemic attack (TIA), and cerebral infarction without residual deficits: Secondary | ICD-10-CM | POA: Diagnosis not present

## 2016-09-12 DIAGNOSIS — Z79899 Other long term (current) drug therapy: Secondary | ICD-10-CM | POA: Insufficient documentation

## 2016-09-12 DIAGNOSIS — Z5189 Encounter for other specified aftercare: Secondary | ICD-10-CM

## 2016-09-12 DIAGNOSIS — Z85830 Personal history of malignant neoplasm of bone: Secondary | ICD-10-CM | POA: Insufficient documentation

## 2016-09-12 DIAGNOSIS — Z91040 Latex allergy status: Secondary | ICD-10-CM | POA: Insufficient documentation

## 2016-09-12 MED ORDER — NYSTATIN 100000 UNIT/GM EX OINT: 1.0000 "application " | TOPICAL_OINTMENT | Freq: Two times a day (BID) | CUTANEOUS | 0 refills | Status: DC

## 2016-09-12 MED ORDER — FAMOTIDINE 20 MG PO TABS: 20.0000 mg | ORAL_TABLET | Freq: Two times a day (BID) | ORAL | 0 refills | Status: DC

## 2016-09-12 NOTE — Discharge Instructions (Signed)
Use the nystatin on your hands twice a day for the next week. Continue if you start to see improvement. Use Benadryl as directed for itching. Return here once for fever, worsening rash, or any other problems. Follow-up with a dermatologist of your choice.

## 2016-09-12 NOTE — ED Triage Notes (Signed)
Patient states she is here for "a follow-up appointment" for cellulitis in her right middle finger and hand.  Patient states she feels a minor burning sensation in the reddened areas.

## 2016-09-12 NOTE — ED Provider Notes (Addendum)
[facility] DEPT Provider Note   CSN: [id number] Arrival date & time: 09/12/16  0800     History   Chief Complaint Chief Complaint  Patient presents with  . Wound Check    HPI ALUNA WHISTON is a 75 y.o. female.  75 year old female presents with bilateral hand pruritus and burning. Seen here 2 days ago for possible right middle finger infection which was felt to be either contact dermatitis or early cellulitis. Was placed on Augmentin and told to follow-up in 2 days for recheck. She states now that she has had distinct areas of erythema at the webspace of her hands. She denies any fever or chills. Eyes any oral lesions. No myalgias. No trouble breathing or swallowing. Does note a chronic recurring groin rash for which she has been seen in the past. Denies any dysuria or hematuria with that. No new chemicals being used. Has been compliant with her antibiotics.      Past Medical History:  Diagnosis Date  . Anxiety   . Arthritis   . Asthma   . Cancer (HCC)    THYROID, SKIN, NOSE, BONE  . Chronic kidney disease   . Diverticulosis    Pt reported on 03/15/12  . Eye problems    LOST VISION RIGHT EYE  . Hernia   . History of blood clots    LEG  . Hyperlipidemia   . Hypertension   . Melanoma ([place])   . Melanoma ([place]) 1989   chest  . MGUS (monoclonal gammopathy of unknown significance)   . MGUS (monoclonal gammopathy of unknown significance)   . Mitral valve prolapse   . Perforated bowel ([place])   . Peritonitis ([place])   . Stroke ([place])   . Thyroid disease     Patient Active Problem List   Diagnosis Date Noted  . Periorbital edema 01/22/2015  . Allergic rhinitis 08/12/2014  . Anxiety 07/04/2014  . 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[place]) 07/04/2014  . Multiple myeloma without remission ([place]) 11/01/2013  . Cerebral vascular accident ([place]) 02/19/2013  . Bloodgood disease 02/19/2013  . Acid reflux 02/19/2013  . Blood in the urine 02/19/2013  . Billowing mitral  valve 02/19/2013  . Adenomatous colon polyp 02/19/2013  . 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 Cataract, nuclear 12/16/2011  . Intragel vitreous hemorrhage ([place]) 12/16/2011  . Age-related macular degeneration, dry 12/02/2011  . Post-radiation retinopathy 12/02/2011  . Pseudophakia 12/02/2011  . MGUS (monoclonal gammopathy of unknown significance) 11/26/2011  . 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 Extrinsic asthma 09/21/2011  . Colon, diverticulosis 07/26/2011  . Benign hypertensive heart disease without heart failure 03/31/2011  . Hypercholesterolemia 03/31/2011  . Hypothyroidism 03/31/2011  . History of melanoma 03/31/2011  . Hx of thyroid cancer 03/31/2011  . H/O malignant melanoma of skin 03/31/2011    Past Surgical History:  Procedure Laterality Date  . ABDOMINAL HYSTERECTOMY    . BREAST LUMPECTOMY  12/2010   R breast  . BREAST LUMPECTOMY  2004   L axilla neg for cancer.  [name] CARDIOVASCULAR STRESS TEST  2006   NORMAL  . HERNIA REPAIR    . LEG SURGERY     VEIN & BLOOD CLOT  . MELANOMA EXCISION     R eye  . ROTATOR CUFF REPAIR  2004   RIGHT SHOULDER  . SKIN CANCER EXCISION  2011, 2013   squamous cell of nose x 2  . TRANSTHORACIC ECHOCARDIOGRAM  2006  OB History    No data available       Home Medications    Prior to Admission medications   Medication Sig Start Date End Date Taking? Authorizing Provider  acetaminophen (TYLENOL) 500 MG tablet Take 250 mg by mouth every 6 (six) hours as needed for moderate pain.    Historical Provider, MD  albuterol (VENTOLIN HFA) 108 (90 BASE) MCG/ACT inhaler Inhale 2 puffs into the lungs every 6 (six) hours as needed for wheezing or shortness of breath.     Historical Provider, MD  ALPRAZolam Duanne Moron) 0.5 MG tablet Take 0.5 mg by mouth daily as needed for anxiety.  07/26/11   Historical Provider, MD  amLODipine (NORVASC) 5 MG tablet Take 1 tablet (5 mg total) by mouth daily. 06/11/16   Burtis Junes, NP    amoxicillin-clavulanate (AUGMENTIN) 875-125 MG tablet Take 1 tablet by mouth every 12 (twelve) hours. 09/10/16   Sharlett Iles, MD  aspirin 81 MG chewable tablet Chew 81 mg by mouth at bedtime.  07/26/11   Historical Provider, MD  clonazePAM (KLONOPIN) 0.5 MG tablet Take 0.25-0.5 mg by mouth at bedtime as needed for anxiety.  05/06/12   Historical Provider, MD  fexofenadine (ALLEGRA) 180 MG tablet Take 180 mg by mouth daily.     Historical Provider, MD  fluticasone (FLONASE) 50 MCG/ACT nasal spray Place 1 spray into both nostrils 2 (two) times daily. 07/09/15   Javier Glazier, MD  fluticasone (FLONASE) 50 MCG/ACT nasal spray Place 2 sprays into both nostrils daily. Patient not taking: Reported on 09/10/2016 07/23/16   Javier Glazier, MD  levothyroxine (SYNTHROID, LEVOTHROID) 112 MCG tablet Take 112 mcg by mouth daily before breakfast. Once daily 11/13/15   Historical Provider, MD  MICARDIS 40 MG tablet Take 1 tablet (40 mg total) by mouth daily. 11/27/12   Darlin Coco, MD  mometasone [facility]) 220 MCG/INH inhaler Inhale 2 puffs into the lungs daily as needed (shortness of breath).     Historical Provider, MD  Multiple Vitamins-Minerals (PRESERVISION AREDS PO) Take 1 capsule by mouth 2 (two) times daily.    Historical Provider, MD  neomycin-polymyxin-dexameth (MAXITROL) 0.1 % OINT Place 1 application into the left eye 2 (two) times daily.    Historical Provider, MD  nystatin ointment (MYCOSTATIN) Apply 1 application topically 2 (two) times daily. As directed 11/09/15   Historical Provider, MD  Olopatadine HCl 0.7 % SOLN Place 1 drop into the left eye daily. Reported on 12/02/2015    Historical Provider, MD  simvastatin (ZOCOR) 20 MG tablet Take 1 tablet (20 mg total) by mouth every evening. 12/10/15   Burtis Junes, NP  valACYclovir (VALTREX) 500 MG tablet Take 1 tablet by mouth daily.  06/23/15   Historical Provider, MD    Family History Family History  Problem Relation Age of Onset  .  Asthma Father   . Diabetes Father   . Heart failure Father   . Cancer Father     Prostate and kidney cancer  . Emphysema Father   . Diabetes Sister   . Cancer Sister   . Stroke Mother 20    Her mother passed away from this stroke at the age of 16  . Cancer Brother     brain cancer & prostate cancer  . Cervical cancer Sister   . Cancer Sister     Recurrent cancer involving the neck and the jaw    Social History Social History  Substance Use Topics  . Smoking  status: Passive Smoke Exposure - Never Smoker  . Smokeless tobacco: Never Used     Comment: Exposure rarely through parents but significant exposure at work  . Alcohol use No     Allergies   Decadrol [dexamethasone]; Doxycycline; Fluconazole; Lipitor [atorvastatin]; Aldactone [spironolactone]; Hydrocodone; Levofloxacin; Losartan; Prednisone; Pseudoephedrine; Quinolones; Sertraline; Sertraline hcl; Sudafed [pseudoephedrine hcl]; Trazodone and nefazodone; Ultram [tramadol]; Adhesive [tape]; Hydrocodone-acetaminophen; and Latex   Review of Systems Review of Systems  All other systems reviewed and are negative.    Physical Exam Updated Vital Signs BP 138/75 (BP Location: Right Arm)   Pulse 68   Temp 97.8 F (36.6 C) (Oral)   SpO2 97%   Physical Exam  Constitutional: She is oriented to person, place, and time. She appears well-developed and well-nourished.  Non-toxic appearance. No distress.  HENT:  Head: Normocephalic and atraumatic.  Eyes: Conjunctivae, EOM and lids are normal. Pupils are equal, round, and reactive to light.  Neck: Normal range of motion. Neck supple. No tracheal deviation present. No thyroid mass present.  Cardiovascular: Normal rate, regular rhythm and normal heart sounds.  Exam reveals no gallop.   No murmur heard. Pulmonary/Chest: Effort normal and breath sounds normal. No stridor. No respiratory distress. She has no decreased breath sounds. She has no wheezes. She has no rhonchi. She has no  rales.  Abdominal: Soft. Normal appearance and bowel sounds are normal. She exhibits no distension. There is no tenderness. There is no rebound and no CVA tenderness.  Musculoskeletal: Normal range of motion. She exhibits no edema or tenderness.  Neurological: She is alert and oriented to person, place, and time. She has normal strength. No cranial nerve deficit or sensory deficit. GCS eye subscore is 4. GCS verbal subscore is 5. GCS motor subscore is 6.  Skin: Skin is warm and dry. Rash noted. No abrasion and no purpura noted. Rash is macular and papular. Rash is not nodular.  Please see attached images  Psychiatric: She has a normal mood and affect. Her speech is normal and behavior is normal.  Nursing note and vitals reviewed.    ED Treatments / Results  Labs (all labs ordered are listed, but only abnormal results are displayed) Labs Reviewed - No data to display  EKG  EKG Interpretation None       Radiology Dg Finger Middle Right  Result Date: 09/10/2016 CLINICAL DATA:  Swelling with redness and blisters involving the middle finger. EXAM: RIGHT MIDDLE FINGER 2+V COMPARISON:  01/27/2010 FINDINGS: Again noted is severe joint space loss involving the middle finger PIP joint with osteophytosis. There has been progression of joint space narrowing in the middle finger DIP joint with osteophytosis. Negative for an acute fracture. Mild diffuse swelling throughout the soft tissues. IMPRESSION: Severe joint space loss and osteophytosis involving the middle finger DIP and PIP joints. There has been progression of the disease since 2011. Radiographic findings are most suggestive for osteoarthritis. Electronically Signed   By: Markus Daft M.D.   On: 09/10/2016 12:56    Procedures Procedures (including critical care time)  Medications Ordered in ED Medications - No data to display   Initial Impression / Assessment and Plan / ED Course  I have reviewed the triage vital signs and the nursing  notes.  Pertinent labs & imaging results that were available during my care of the patient were reviewed by me and considered in my medical decision making (see chart for details).  Clinical Course    Patient has multiple medical allergies. Patient  does not appear to have any signs of systemic infection. Patient's finger does not appear to have cellulitis but does appear to have some type of contact dermatitis. Patient notes that she has had profound pruritus. Her current pelvic complaints are similar to her prior candidal infection. Will treat with topical ointment. States that she will follow with her primary care doctor for exam. Patient has contraindications to systemic corticosteroids. Will advise patient to use Benadryl and Pepcid. She will also contact a dermatologist for follow-up. Return precautions given   Final Clinical Impressions(s) / ED Diagnoses   Final diagnoses:  None    New Prescriptions New Prescriptions   No medications on file     Lacretia Leigh, MD 09/12/16 [id number]    Lacretia Leigh, MD 09/12/16 [phone]

## 2016-09-14 DIAGNOSIS — L309 Dermatitis, unspecified: Secondary | ICD-10-CM | POA: Diagnosis not present

## 2016-09-14 DIAGNOSIS — L304 Erythema intertrigo: Secondary | ICD-10-CM | POA: Diagnosis not present

## 2016-09-16 DIAGNOSIS — H01005 Unspecified blepharitis left lower eyelid: Secondary | ICD-10-CM | POA: Diagnosis not present

## 2016-09-16 DIAGNOSIS — H0015 Chalazion left lower eyelid: Secondary | ICD-10-CM | POA: Diagnosis not present

## 2016-09-16 DIAGNOSIS — H01004 Unspecified blepharitis left upper eyelid: Secondary | ICD-10-CM | POA: Diagnosis not present

## 2016-09-16 DIAGNOSIS — H01001 Unspecified blepharitis right upper eyelid: Secondary | ICD-10-CM | POA: Diagnosis not present

## 2016-09-16 DIAGNOSIS — H01002 Unspecified blepharitis right lower eyelid: Secondary | ICD-10-CM | POA: Diagnosis not present

## 2016-09-16 DIAGNOSIS — H04123 Dry eye syndrome of bilateral lacrimal glands: Secondary | ICD-10-CM | POA: Diagnosis not present

## 2016-09-20 DIAGNOSIS — Z6827 Body mass index (BMI) 27.0-27.9, adult: Secondary | ICD-10-CM | POA: Diagnosis not present

## 2016-09-20 DIAGNOSIS — Z5112 Encounter for antineoplastic immunotherapy: Secondary | ICD-10-CM | POA: Diagnosis not present

## 2016-09-20 DIAGNOSIS — D472 Monoclonal gammopathy: Secondary | ICD-10-CM | POA: Diagnosis not present

## 2016-09-20 DIAGNOSIS — Z91040 Latex allergy status: Secondary | ICD-10-CM | POA: Diagnosis not present

## 2016-09-20 DIAGNOSIS — Z79899 Other long term (current) drug therapy: Secondary | ICD-10-CM | POA: Diagnosis not present

## 2016-09-20 DIAGNOSIS — Z885 Allergy status to narcotic agent status: Secondary | ICD-10-CM | POA: Diagnosis not present

## 2016-09-20 DIAGNOSIS — Z888 Allergy status to other drugs, medicaments and biological substances status: Secondary | ICD-10-CM | POA: Diagnosis not present

## 2016-09-20 DIAGNOSIS — C9000 Multiple myeloma not having achieved remission: Secondary | ICD-10-CM | POA: Diagnosis not present

## 2016-09-20 DIAGNOSIS — Z7982 Long term (current) use of aspirin: Secondary | ICD-10-CM | POA: Diagnosis not present

## 2016-09-24 DIAGNOSIS — R319 Hematuria, unspecified: Secondary | ICD-10-CM | POA: Diagnosis not present

## 2016-09-24 DIAGNOSIS — I129 Hypertensive chronic kidney disease with stage 1 through stage 4 chronic kidney disease, or unspecified chronic kidney disease: Secondary | ICD-10-CM | POA: Diagnosis not present

## 2016-09-24 DIAGNOSIS — E785 Hyperlipidemia, unspecified: Secondary | ICD-10-CM | POA: Diagnosis not present

## 2016-09-24 DIAGNOSIS — Z6828 Body mass index (BMI) 28.0-28.9, adult: Secondary | ICD-10-CM | POA: Diagnosis not present

## 2016-09-24 DIAGNOSIS — N182 Chronic kidney disease, stage 2 (mild): Secondary | ICD-10-CM | POA: Diagnosis not present

## 2016-09-24 DIAGNOSIS — C73 Malignant neoplasm of thyroid gland: Secondary | ICD-10-CM | POA: Diagnosis not present

## 2016-09-24 DIAGNOSIS — C9000 Multiple myeloma not having achieved remission: Secondary | ICD-10-CM | POA: Diagnosis not present

## 2016-09-24 DIAGNOSIS — E039 Hypothyroidism, unspecified: Secondary | ICD-10-CM | POA: Diagnosis not present

## 2016-09-24 DIAGNOSIS — I639 Cerebral infarction, unspecified: Secondary | ICD-10-CM | POA: Diagnosis not present

## 2016-09-30 DIAGNOSIS — H1033 Unspecified acute conjunctivitis, bilateral: Secondary | ICD-10-CM | POA: Diagnosis not present

## 2016-10-05 DIAGNOSIS — Z5111 Encounter for antineoplastic chemotherapy: Secondary | ICD-10-CM | POA: Diagnosis not present

## 2016-10-05 DIAGNOSIS — C9000 Multiple myeloma not having achieved remission: Secondary | ICD-10-CM | POA: Diagnosis not present

## 2016-10-06 DIAGNOSIS — E890 Postprocedural hypothyroidism: Secondary | ICD-10-CM | POA: Diagnosis not present

## 2016-10-11 DIAGNOSIS — C73 Malignant neoplasm of thyroid gland: Secondary | ICD-10-CM | POA: Diagnosis not present

## 2016-10-11 DIAGNOSIS — E890 Postprocedural hypothyroidism: Secondary | ICD-10-CM | POA: Diagnosis not present

## 2016-10-13 DIAGNOSIS — F411 Generalized anxiety disorder: Secondary | ICD-10-CM | POA: Diagnosis not present

## 2016-10-19 DIAGNOSIS — Z85820 Personal history of malignant melanoma of skin: Secondary | ICD-10-CM | POA: Diagnosis not present

## 2016-10-19 DIAGNOSIS — Z6827 Body mass index (BMI) 27.0-27.9, adult: Secondary | ICD-10-CM | POA: Diagnosis not present

## 2016-10-19 DIAGNOSIS — R21 Rash and other nonspecific skin eruption: Secondary | ICD-10-CM | POA: Diagnosis not present

## 2016-10-19 DIAGNOSIS — Z5112 Encounter for antineoplastic immunotherapy: Secondary | ICD-10-CM | POA: Diagnosis not present

## 2016-10-19 DIAGNOSIS — Z7982 Long term (current) use of aspirin: Secondary | ICD-10-CM | POA: Diagnosis not present

## 2016-10-19 DIAGNOSIS — Z885 Allergy status to narcotic agent status: Secondary | ICD-10-CM | POA: Diagnosis not present

## 2016-10-19 DIAGNOSIS — Z79890 Hormone replacement therapy (postmenopausal): Secondary | ICD-10-CM | POA: Diagnosis not present

## 2016-10-19 DIAGNOSIS — Z888 Allergy status to other drugs, medicaments and biological substances status: Secondary | ICD-10-CM | POA: Diagnosis not present

## 2016-10-19 DIAGNOSIS — D472 Monoclonal gammopathy: Secondary | ICD-10-CM | POA: Diagnosis not present

## 2016-10-19 DIAGNOSIS — C9000 Multiple myeloma not having achieved remission: Secondary | ICD-10-CM | POA: Diagnosis not present

## 2016-10-19 DIAGNOSIS — Z79899 Other long term (current) drug therapy: Secondary | ICD-10-CM | POA: Diagnosis not present

## 2016-10-22 DIAGNOSIS — L309 Dermatitis, unspecified: Secondary | ICD-10-CM | POA: Diagnosis not present

## 2016-10-27 DIAGNOSIS — F411 Generalized anxiety disorder: Secondary | ICD-10-CM | POA: Diagnosis not present

## 2016-10-29 DIAGNOSIS — R319 Hematuria, unspecified: Secondary | ICD-10-CM | POA: Diagnosis not present

## 2016-10-29 DIAGNOSIS — R300 Dysuria: Secondary | ICD-10-CM | POA: Diagnosis not present

## 2016-11-02 DIAGNOSIS — C9000 Multiple myeloma not having achieved remission: Secondary | ICD-10-CM | POA: Diagnosis not present

## 2016-11-02 DIAGNOSIS — Z5112 Encounter for antineoplastic immunotherapy: Secondary | ICD-10-CM | POA: Diagnosis not present

## 2016-11-04 DIAGNOSIS — R319 Hematuria, unspecified: Secondary | ICD-10-CM | POA: Diagnosis not present

## 2016-11-08 DIAGNOSIS — R309 Painful micturition, unspecified: Secondary | ICD-10-CM | POA: Diagnosis not present

## 2016-11-08 DIAGNOSIS — Z85850 Personal history of malignant neoplasm of thyroid: Secondary | ICD-10-CM | POA: Diagnosis not present

## 2016-11-08 DIAGNOSIS — R3989 Other symptoms and signs involving the genitourinary system: Secondary | ICD-10-CM | POA: Diagnosis not present

## 2016-11-08 DIAGNOSIS — K769 Liver disease, unspecified: Secondary | ICD-10-CM | POA: Diagnosis not present

## 2016-11-08 DIAGNOSIS — R918 Other nonspecific abnormal finding of lung field: Secondary | ICD-10-CM | POA: Diagnosis not present

## 2016-11-08 DIAGNOSIS — R319 Hematuria, unspecified: Secondary | ICD-10-CM | POA: Diagnosis not present

## 2016-11-09 DIAGNOSIS — L309 Dermatitis, unspecified: Secondary | ICD-10-CM | POA: Diagnosis not present

## 2016-11-10 DIAGNOSIS — F411 Generalized anxiety disorder: Secondary | ICD-10-CM | POA: Diagnosis not present

## 2016-11-11 DIAGNOSIS — C9000 Multiple myeloma not having achieved remission: Secondary | ICD-10-CM | POA: Diagnosis not present

## 2016-11-12 DIAGNOSIS — R079 Chest pain, unspecified: Secondary | ICD-10-CM | POA: Diagnosis not present

## 2016-11-25 DIAGNOSIS — C9000 Multiple myeloma not having achieved remission: Secondary | ICD-10-CM | POA: Diagnosis not present

## 2016-11-25 DIAGNOSIS — Z6826 Body mass index (BMI) 26.0-26.9, adult: Secondary | ICD-10-CM | POA: Diagnosis not present

## 2016-11-29 DIAGNOSIS — K769 Liver disease, unspecified: Secondary | ICD-10-CM | POA: Diagnosis not present

## 2016-11-29 DIAGNOSIS — C9000 Multiple myeloma not having achieved remission: Secondary | ICD-10-CM | POA: Diagnosis not present

## 2016-11-29 DIAGNOSIS — D734 Cyst of spleen: Secondary | ICD-10-CM | POA: Diagnosis not present

## 2016-11-29 DIAGNOSIS — Z85840 Personal history of malignant neoplasm of eye: Secondary | ICD-10-CM | POA: Diagnosis not present

## 2016-11-29 DIAGNOSIS — N281 Cyst of kidney, acquired: Secondary | ICD-10-CM | POA: Diagnosis not present

## 2016-11-29 DIAGNOSIS — K862 Cyst of pancreas: Secondary | ICD-10-CM | POA: Diagnosis not present

## 2016-11-29 DIAGNOSIS — K7689 Other specified diseases of liver: Secondary | ICD-10-CM | POA: Diagnosis not present

## 2016-11-30 DIAGNOSIS — Z79899 Other long term (current) drug therapy: Secondary | ICD-10-CM | POA: Diagnosis not present

## 2016-11-30 DIAGNOSIS — C9000 Multiple myeloma not having achieved remission: Secondary | ICD-10-CM | POA: Diagnosis not present

## 2016-11-30 DIAGNOSIS — Z5112 Encounter for antineoplastic immunotherapy: Secondary | ICD-10-CM | POA: Diagnosis not present

## 2016-12-15 ENCOUNTER — Ambulatory Visit: Payer: Medicare Other | Admitting: Pulmonary Disease

## 2016-12-15 DIAGNOSIS — Z5112 Encounter for antineoplastic immunotherapy: Secondary | ICD-10-CM | POA: Diagnosis not present

## 2016-12-15 DIAGNOSIS — C9000 Multiple myeloma not having achieved remission: Secondary | ICD-10-CM | POA: Diagnosis not present

## 2016-12-15 DIAGNOSIS — E890 Postprocedural hypothyroidism: Secondary | ICD-10-CM | POA: Diagnosis not present

## 2016-12-16 ENCOUNTER — Other Ambulatory Visit: Payer: Self-pay

## 2016-12-21 DIAGNOSIS — C9000 Multiple myeloma not having achieved remission: Secondary | ICD-10-CM | POA: Diagnosis not present

## 2016-12-21 DIAGNOSIS — Z85840 Personal history of malignant neoplasm of eye: Secondary | ICD-10-CM | POA: Diagnosis not present

## 2016-12-23 ENCOUNTER — Other Ambulatory Visit: Payer: Self-pay | Admitting: Nurse Practitioner

## 2016-12-23 DIAGNOSIS — H10413 Chronic giant papillary conjunctivitis, bilateral: Secondary | ICD-10-CM | POA: Diagnosis not present

## 2016-12-23 MED ORDER — SIMVASTATIN 20 MG PO TABS: 20.0000 mg | ORAL_TABLET | Freq: Every evening | ORAL | 0 refills | Status: DC

## 2016-12-24 DIAGNOSIS — H353290 Exudative age-related macular degeneration, unspecified eye, stage unspecified: Secondary | ICD-10-CM | POA: Diagnosis not present

## 2016-12-24 DIAGNOSIS — F419 Anxiety disorder, unspecified: Secondary | ICD-10-CM | POA: Diagnosis not present

## 2016-12-24 DIAGNOSIS — E039 Hypothyroidism, unspecified: Secondary | ICD-10-CM | POA: Diagnosis not present

## 2016-12-24 DIAGNOSIS — K219 Gastro-esophageal reflux disease without esophagitis: Secondary | ICD-10-CM | POA: Diagnosis not present

## 2016-12-24 DIAGNOSIS — Z85850 Personal history of malignant neoplasm of thyroid: Secondary | ICD-10-CM | POA: Diagnosis not present

## 2016-12-24 DIAGNOSIS — I119 Hypertensive heart disease without heart failure: Secondary | ICD-10-CM | POA: Diagnosis not present

## 2016-12-24 DIAGNOSIS — Z8673 Personal history of transient ischemic attack (TIA), and cerebral infarction without residual deficits: Secondary | ICD-10-CM | POA: Diagnosis not present

## 2016-12-24 DIAGNOSIS — C9000 Multiple myeloma not having achieved remission: Secondary | ICD-10-CM | POA: Diagnosis not present

## 2016-12-24 DIAGNOSIS — E7800 Pure hypercholesterolemia, unspecified: Secondary | ICD-10-CM | POA: Diagnosis not present

## 2016-12-24 DIAGNOSIS — Z85820 Personal history of malignant melanoma of skin: Secondary | ICD-10-CM | POA: Diagnosis not present

## 2016-12-24 DIAGNOSIS — Z888 Allergy status to other drugs, medicaments and biological substances status: Secondary | ICD-10-CM | POA: Diagnosis not present

## 2016-12-24 DIAGNOSIS — K769 Liver disease, unspecified: Secondary | ICD-10-CM | POA: Diagnosis not present

## 2016-12-24 DIAGNOSIS — H5461 Unqualified visual loss, right eye, normal vision left eye: Secondary | ICD-10-CM | POA: Diagnosis not present

## 2016-12-24 DIAGNOSIS — I341 Nonrheumatic mitral (valve) prolapse: Secondary | ICD-10-CM | POA: Diagnosis not present

## 2016-12-24 DIAGNOSIS — Z7982 Long term (current) use of aspirin: Secondary | ICD-10-CM | POA: Diagnosis not present

## 2016-12-24 DIAGNOSIS — Z79899 Other long term (current) drug therapy: Secondary | ICD-10-CM | POA: Diagnosis not present

## 2016-12-24 DIAGNOSIS — Z923 Personal history of irradiation: Secondary | ICD-10-CM | POA: Diagnosis not present

## 2016-12-24 DIAGNOSIS — Z881 Allergy status to other antibiotic agents status: Secondary | ICD-10-CM | POA: Diagnosis not present

## 2016-12-24 DIAGNOSIS — Z6826 Body mass index (BMI) 26.0-26.9, adult: Secondary | ICD-10-CM | POA: Diagnosis not present

## 2016-12-24 DIAGNOSIS — Z885 Allergy status to narcotic agent status: Secondary | ICD-10-CM | POA: Diagnosis not present

## 2016-12-24 DIAGNOSIS — K5730 Diverticulosis of large intestine without perforation or abscess without bleeding: Secondary | ICD-10-CM | POA: Diagnosis not present

## 2016-12-24 DIAGNOSIS — Z7983 Long term (current) use of bisphosphonates: Secondary | ICD-10-CM | POA: Diagnosis not present

## 2016-12-24 DIAGNOSIS — Z86010 Personal history of colonic polyps: Secondary | ICD-10-CM | POA: Diagnosis not present

## 2016-12-24 DIAGNOSIS — Z91040 Latex allergy status: Secondary | ICD-10-CM | POA: Diagnosis not present

## 2016-12-27 DIAGNOSIS — E669 Obesity, unspecified: Secondary | ICD-10-CM | POA: Diagnosis not present

## 2016-12-27 DIAGNOSIS — Z5112 Encounter for antineoplastic immunotherapy: Secondary | ICD-10-CM | POA: Diagnosis not present

## 2016-12-27 DIAGNOSIS — C73 Malignant neoplasm of thyroid gland: Secondary | ICD-10-CM | POA: Diagnosis not present

## 2016-12-27 DIAGNOSIS — R319 Hematuria, unspecified: Secondary | ICD-10-CM | POA: Diagnosis not present

## 2016-12-27 DIAGNOSIS — I639 Cerebral infarction, unspecified: Secondary | ICD-10-CM | POA: Diagnosis not present

## 2016-12-27 DIAGNOSIS — D369 Benign neoplasm, unspecified site: Secondary | ICD-10-CM | POA: Diagnosis not present

## 2016-12-27 DIAGNOSIS — I8393 Asymptomatic varicose veins of bilateral lower extremities: Secondary | ICD-10-CM | POA: Diagnosis not present

## 2016-12-27 DIAGNOSIS — I129 Hypertensive chronic kidney disease with stage 1 through stage 4 chronic kidney disease, or unspecified chronic kidney disease: Secondary | ICD-10-CM | POA: Diagnosis not present

## 2016-12-27 DIAGNOSIS — N182 Chronic kidney disease, stage 2 (mild): Secondary | ICD-10-CM | POA: Diagnosis not present

## 2016-12-27 DIAGNOSIS — E039 Hypothyroidism, unspecified: Secondary | ICD-10-CM | POA: Diagnosis not present

## 2016-12-27 DIAGNOSIS — C6990 Malignant neoplasm of unspecified site of unspecified eye: Secondary | ICD-10-CM | POA: Diagnosis not present

## 2016-12-27 DIAGNOSIS — E785 Hyperlipidemia, unspecified: Secondary | ICD-10-CM | POA: Diagnosis not present

## 2016-12-27 DIAGNOSIS — C9000 Multiple myeloma not having achieved remission: Secondary | ICD-10-CM | POA: Diagnosis not present

## 2016-12-28 DIAGNOSIS — N182 Chronic kidney disease, stage 2 (mild): Secondary | ICD-10-CM | POA: Diagnosis not present

## 2016-12-29 DIAGNOSIS — C787 Secondary malignant neoplasm of liver and intrahepatic bile duct: Secondary | ICD-10-CM | POA: Diagnosis not present

## 2016-12-29 DIAGNOSIS — Z79899 Other long term (current) drug therapy: Secondary | ICD-10-CM | POA: Diagnosis not present
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
## 2017-01-05 ENCOUNTER — Encounter: Payer: Self-pay | Admitting: Nurse Practitioner

## 2017-01-05 ENCOUNTER — Ambulatory Visit (INDEPENDENT_AMBULATORY_CARE_PROVIDER_SITE_OTHER): Payer: Medicare Other | Admitting: Nurse Practitioner

## 2017-01-05 ENCOUNTER — Encounter (INDEPENDENT_AMBULATORY_CARE_PROVIDER_SITE_OTHER): Payer: Self-pay

## 2017-01-05 ENCOUNTER — Other Ambulatory Visit: Payer: Self-pay | Admitting: *Deleted

## 2017-01-05 VITALS — BP 170/100 | HR 79 | Ht 61.0 in

## 2017-01-05 DIAGNOSIS — I119 Hypertensive heart disease without heart failure: Secondary | ICD-10-CM | POA: Diagnosis not present

## 2017-01-05 DIAGNOSIS — E7800 Pure hypercholesterolemia, unspecified: Secondary | ICD-10-CM | POA: Diagnosis not present

## 2017-01-05 MED ORDER — AMLODIPINE BESYLATE 5 MG PO TABS
5.0000 mg | ORAL_TABLET | Freq: Every day | ORAL | 3 refills | Status: DC
Start: 2017-01-05 — End: 2018-07-03

## 2017-01-05 MED ORDER — MICARDIS 40 MG PO TABS: 40.0000 mg | ORAL_TABLET | Freq: Every day | ORAL | 3 refills | Status: AC

## 2017-01-05 NOTE — Addendum Note (Signed)
Addended by: Burtis Junes on: 01/05/2017 02:33 PM   Modules accepted: Orders

## 2017-01-05 NOTE — Patient Instructions (Addendum)
We will be checking the following labs today - NONE   Medication Instructions:    Continue with your current medicines. I'd like for to take your Amlodipine and Telmisartan every day    Testing/Procedures To Be Arranged:  N/A  Follow-Up:   See me back in a year    Other Special Instructions:   N/A    If you need a refill on your cardiac medications before your next appointment, please call your pharmacy.   Call the [facility] office at [phone] if you have any questions, problems or concerns.

## 2017-01-07 ENCOUNTER — Telehealth: Payer: Self-pay | Admitting: *Deleted

## 2017-01-07 NOTE — Telephone Encounter (Signed)
Office received a sheet on pt's micardis from [employer], insurance will not cover brand name.  s/w pt stated doesn't think needs brand name. Pt is calling pharmacy to find out if needs to take brand name and will call back.

## 2017-01-10 DIAGNOSIS — C9000 Multiple myeloma not having achieved remission: Secondary | ICD-10-CM | POA: Diagnosis not present

## 2017-01-10 DIAGNOSIS — Z5112 Encounter for antineoplastic immunotherapy: Secondary | ICD-10-CM | POA: Diagnosis not present

## 2017-01-11 DIAGNOSIS — F411 Generalized anxiety disorder: Secondary | ICD-10-CM | POA: Diagnosis not present

## 2017-01-12 ENCOUNTER — Telehealth: Payer: Self-pay | Admitting: Nurse Practitioner

## 2017-01-12 NOTE — Telephone Encounter (Signed)
Noted  

## 2017-01-12 NOTE — Telephone Encounter (Signed)
[name] is calling to speak with you about her blood pressure medication . States that its been high and she does not have the medication as of yet. Thanks

## 2017-01-12 NOTE — Telephone Encounter (Signed)
Pt called in to confirm what Cecille Rubin told pt to take at last ov appointment. Amlodipine (5 mg ) daily and Micardis (40 mg) daily. Pt stated is taking. Pt also stated wanted Cecille Rubin to know pt is having cancer surgery, May 8 at the [facility] in [place].  Will route to [place] to [place].

## 2017-01-17 ENCOUNTER — Other Ambulatory Visit: Payer: Self-pay | Admitting: Nurse Practitioner

## 2017-01-19 DIAGNOSIS — R300 Dysuria: Secondary | ICD-10-CM | POA: Diagnosis not present

## 2017-01-19 DIAGNOSIS — N644 Mastodynia: Secondary | ICD-10-CM | POA: Diagnosis not present

## 2017-01-19 DIAGNOSIS — N952 Postmenopausal atrophic vaginitis: Secondary | ICD-10-CM | POA: Diagnosis not present

## 2017-01-19 DIAGNOSIS — N6310 Unspecified lump in the right breast, unspecified quadrant: Secondary | ICD-10-CM | POA: Diagnosis not present

## 2017-01-19 NOTE — Telephone Encounter (Signed)
Pharmacy is requesting a refill on simvastatin however this med is not listed on patients current med list. It was removed at recent office visit with a reason of change in therapy. Per assessment/plan will hold on this for now. I have spoke with patient and she was not aware that she was to hold this medication however when she verified her medication bottles with me she did not have this medication. Please advise. Thanks, MI

## 2017-01-24 DIAGNOSIS — Z85828 Personal history of other malignant neoplasm of skin: Secondary | ICD-10-CM | POA: Diagnosis not present

## 2017-01-24 DIAGNOSIS — M79672 Pain in left foot: Secondary | ICD-10-CM | POA: Diagnosis not present

## 2017-01-24 DIAGNOSIS — R319 Hematuria, unspecified: Secondary | ICD-10-CM | POA: Diagnosis not present

## 2017-01-24 DIAGNOSIS — Z6826 Body mass index (BMI) 26.0-26.9, adult: Secondary | ICD-10-CM | POA: Diagnosis not present

## 2017-01-24 DIAGNOSIS — C9000 Multiple myeloma not having achieved remission: Secondary | ICD-10-CM | POA: Diagnosis not present

## 2017-01-24 DIAGNOSIS — Z8673 Personal history of transient ischemic attack (TIA), and cerebral infarction without residual deficits: Secondary | ICD-10-CM | POA: Diagnosis not present

## 2017-01-24 DIAGNOSIS — K5730 Diverticulosis of large intestine without perforation or abscess without bleeding: Secondary | ICD-10-CM | POA: Diagnosis not present

## 2017-01-24 DIAGNOSIS — N6019 Diffuse cystic mastopathy of unspecified breast: Secondary | ICD-10-CM | POA: Diagnosis not present

## 2017-01-24 DIAGNOSIS — C439 Malignant melanoma of skin, unspecified: Secondary | ICD-10-CM | POA: Diagnosis not present

## 2017-01-24 DIAGNOSIS — I341 Nonrheumatic mitral (valve) prolapse: Secondary | ICD-10-CM | POA: Diagnosis not present

## 2017-01-24 DIAGNOSIS — L304 Erythema intertrigo: Secondary | ICD-10-CM | POA: Diagnosis not present

## 2017-01-24 DIAGNOSIS — D126 Benign neoplasm of colon, unspecified: Secondary | ICD-10-CM | POA: Diagnosis not present

## 2017-01-24 DIAGNOSIS — Z961 Presence of intraocular lens: Secondary | ICD-10-CM | POA: Diagnosis not present

## 2017-01-24 DIAGNOSIS — Z5112 Encounter for antineoplastic immunotherapy: Secondary | ICD-10-CM | POA: Diagnosis not present

## 2017-01-24 DIAGNOSIS — H547 Unspecified visual loss: Secondary | ICD-10-CM | POA: Diagnosis not present

## 2017-01-24 DIAGNOSIS — K769 Liver disease, unspecified: Secondary | ICD-10-CM | POA: Diagnosis not present

## 2017-01-24 DIAGNOSIS — M79671 Pain in right foot: Secondary | ICD-10-CM | POA: Diagnosis not present

## 2017-01-24 DIAGNOSIS — L0292 Furuncle, unspecified: Secondary | ICD-10-CM | POA: Diagnosis not present

## 2017-01-24 DIAGNOSIS — E7800 Pure hypercholesterolemia, unspecified: Secondary | ICD-10-CM | POA: Diagnosis not present

## 2017-01-24 DIAGNOSIS — I119 Hypertensive heart disease without heart failure: Secondary | ICD-10-CM | POA: Diagnosis not present

## 2017-01-24 DIAGNOSIS — E039 Hypothyroidism, unspecified: Secondary | ICD-10-CM | POA: Diagnosis not present

## 2017-01-24 DIAGNOSIS — K219 Gastro-esophageal reflux disease without esophagitis: Secondary | ICD-10-CM | POA: Diagnosis not present

## 2017-01-24 DIAGNOSIS — J45909 Unspecified asthma, uncomplicated: Secondary | ICD-10-CM | POA: Diagnosis not present

## 2017-01-24 DIAGNOSIS — F419 Anxiety disorder, unspecified: Secondary | ICD-10-CM | POA: Diagnosis not present

## 2017-01-24 DIAGNOSIS — C73 Malignant neoplasm of thyroid gland: Secondary | ICD-10-CM | POA: Diagnosis not present

## 2017-01-25 DIAGNOSIS — R300 Dysuria: Secondary | ICD-10-CM | POA: Diagnosis not present

## 2017-01-25 DIAGNOSIS — N644 Mastodynia: Secondary | ICD-10-CM | POA: Diagnosis not present

## 2017-01-25 DIAGNOSIS — R3915 Urgency of urination: Secondary | ICD-10-CM | POA: Diagnosis not present

## 2017-01-26 DIAGNOSIS — Z6826 Body mass index (BMI) 26.0-26.9, adult: Secondary | ICD-10-CM | POA: Diagnosis not present

## 2017-01-26 DIAGNOSIS — R399 Unspecified symptoms and signs involving the genitourinary system: Secondary | ICD-10-CM | POA: Diagnosis not present

## 2017-01-26 DIAGNOSIS — R3129 Other microscopic hematuria: Secondary | ICD-10-CM | POA: Diagnosis not present

## 2017-01-27 DIAGNOSIS — E039 Hypothyroidism, unspecified: Secondary | ICD-10-CM | POA: Diagnosis not present

## 2017-01-27 DIAGNOSIS — Z885 Allergy status to narcotic agent status: Secondary | ICD-10-CM | POA: Diagnosis not present

## 2017-01-27 DIAGNOSIS — Z79899 Other long term (current) drug therapy: Secondary | ICD-10-CM | POA: Diagnosis not present

## 2017-01-27 DIAGNOSIS — Z01810 Encounter for preprocedural cardiovascular examination: Secondary | ICD-10-CM | POA: Diagnosis not present

## 2017-01-27 DIAGNOSIS — Z8673 Personal history of transient ischemic attack (TIA), and cerebral infarction without residual deficits: Secondary | ICD-10-CM | POA: Diagnosis not present

## 2017-01-27 DIAGNOSIS — N390 Urinary tract infection, site not specified: Secondary | ICD-10-CM | POA: Diagnosis not present

## 2017-01-27 DIAGNOSIS — Z7982 Long term (current) use of aspirin: Secondary | ICD-10-CM | POA: Diagnosis not present

## 2017-01-27 DIAGNOSIS — Z01818 Encounter for other preprocedural examination: Secondary | ICD-10-CM | POA: Diagnosis not present

## 2017-01-27 DIAGNOSIS — F419 Anxiety disorder, unspecified: Secondary | ICD-10-CM | POA: Diagnosis not present

## 2017-01-27 DIAGNOSIS — H3530 Unspecified macular degeneration: Secondary | ICD-10-CM | POA: Diagnosis not present

## 2017-01-27 DIAGNOSIS — S80262A Insect bite (nonvenomous), left knee, initial encounter: Secondary | ICD-10-CM | POA: Diagnosis not present

## 2017-01-27 DIAGNOSIS — Z85850 Personal history of malignant neoplasm of thyroid: Secondary | ICD-10-CM | POA: Diagnosis not present

## 2017-01-27 DIAGNOSIS — N182 Chronic kidney disease, stage 2 (mild): Secondary | ICD-10-CM | POA: Diagnosis not present

## 2017-01-27 DIAGNOSIS — Z880 Allergy status to penicillin: Secondary | ICD-10-CM | POA: Diagnosis not present

## 2017-01-27 DIAGNOSIS — E785 Hyperlipidemia, unspecified: Secondary | ICD-10-CM | POA: Diagnosis not present

## 2017-01-27 DIAGNOSIS — K769 Liver disease, unspecified: Secondary | ICD-10-CM | POA: Diagnosis not present

## 2017-01-27 DIAGNOSIS — C787 Secondary malignant neoplasm of liver and intrahepatic bile duct: Secondary | ICD-10-CM | POA: Diagnosis not present

## 2017-01-27 DIAGNOSIS — I341 Nonrheumatic mitral (valve) prolapse: Secondary | ICD-10-CM | POA: Diagnosis not present

## 2017-01-27 DIAGNOSIS — Z85820 Personal history of malignant melanoma of skin: Secondary | ICD-10-CM | POA: Diagnosis not present

## 2017-01-27 DIAGNOSIS — J45909 Unspecified asthma, uncomplicated: Secondary | ICD-10-CM | POA: Diagnosis not present

## 2017-01-27 DIAGNOSIS — K219 Gastro-esophageal reflux disease without esophagitis: Secondary | ICD-10-CM | POA: Diagnosis not present

## 2017-01-27 DIAGNOSIS — Z86010 Personal history of colonic polyps: Secondary | ICD-10-CM | POA: Diagnosis not present

## 2017-01-27 DIAGNOSIS — I129 Hypertensive chronic kidney disease with stage 1 through stage 4 chronic kidney disease, or unspecified chronic kidney disease: Secondary | ICD-10-CM | POA: Diagnosis not present

## 2017-01-27 DIAGNOSIS — C9000 Multiple myeloma not having achieved remission: Secondary | ICD-10-CM | POA: Diagnosis not present

## 2017-01-27 DIAGNOSIS — Z923 Personal history of irradiation: Secondary | ICD-10-CM | POA: Diagnosis not present

## 2017-01-27 DIAGNOSIS — Z85828 Personal history of other malignant neoplasm of skin: Secondary | ICD-10-CM | POA: Diagnosis not present

## 2017-01-27 DIAGNOSIS — Z538 Procedure and treatment not carried out for other reasons: Secondary | ICD-10-CM | POA: Diagnosis not present

## 2017-02-07 DIAGNOSIS — R21 Rash and other nonspecific skin eruption: Secondary | ICD-10-CM | POA: Diagnosis not present

## 2017-02-07 DIAGNOSIS — W57XXXA Bitten or stung by nonvenomous insect and other nonvenomous arthropods, initial encounter: Secondary | ICD-10-CM | POA: Diagnosis not present

## 2017-02-08 DIAGNOSIS — C9000 Multiple myeloma not having achieved remission: Secondary | ICD-10-CM | POA: Diagnosis not present

## 2017-02-08 DIAGNOSIS — N390 Urinary tract infection, site not specified: Secondary | ICD-10-CM | POA: Diagnosis not present

## 2017-02-08 DIAGNOSIS — K7689 Other specified diseases of liver: Secondary | ICD-10-CM | POA: Diagnosis not present

## 2017-02-08 DIAGNOSIS — C787 Secondary malignant neoplasm of liver and intrahepatic bile duct: Secondary | ICD-10-CM | POA: Diagnosis not present

## 2017-02-08 DIAGNOSIS — L0390 Cellulitis, unspecified: Secondary | ICD-10-CM | POA: Diagnosis not present

## 2017-02-08 DIAGNOSIS — W57XXXA Bitten or stung by nonvenomous insect and other nonvenomous arthropods, initial encounter: Secondary | ICD-10-CM | POA: Diagnosis not present

## 2017-02-08 DIAGNOSIS — S80262A Insect bite (nonvenomous), left knee, initial encounter: Secondary | ICD-10-CM | POA: Diagnosis not present

## 2017-02-08 DIAGNOSIS — Z538 Procedure and treatment not carried out for other reasons: Secondary | ICD-10-CM | POA: Diagnosis not present

## 2017-02-08 DIAGNOSIS — E039 Hypothyroidism, unspecified: Secondary | ICD-10-CM | POA: Diagnosis not present

## 2017-02-08 DIAGNOSIS — K769 Liver disease, unspecified: Secondary | ICD-10-CM | POA: Diagnosis not present

## 2017-02-09 DIAGNOSIS — Z538 Procedure and treatment not carried out for other reasons: Secondary | ICD-10-CM | POA: Diagnosis not present

## 2017-02-09 DIAGNOSIS — S80262A Insect bite (nonvenomous), left knee, initial encounter: Secondary | ICD-10-CM | POA: Diagnosis not present

## 2017-02-09 DIAGNOSIS — K769 Liver disease, unspecified: Secondary | ICD-10-CM | POA: Diagnosis not present

## 2017-02-09 DIAGNOSIS — L0390 Cellulitis, unspecified: Secondary | ICD-10-CM | POA: Diagnosis not present

## 2017-02-09 DIAGNOSIS — W57XXXA Bitten or stung by nonvenomous insect and other nonvenomous arthropods, initial encounter: Secondary | ICD-10-CM | POA: Diagnosis not present

## 2017-02-09 DIAGNOSIS — N390 Urinary tract infection, site not specified: Secondary | ICD-10-CM | POA: Diagnosis not present

## 2017-02-09 DIAGNOSIS — C9000 Multiple myeloma not having achieved remission: Secondary | ICD-10-CM | POA: Diagnosis not present

## 2017-02-09 DIAGNOSIS — C787 Secondary malignant neoplasm of liver and intrahepatic bile duct: Secondary | ICD-10-CM | POA: Diagnosis not present

## 2017-02-09 DIAGNOSIS — E039 Hypothyroidism, unspecified: Secondary | ICD-10-CM | POA: Diagnosis not present

## 2017-02-14 DIAGNOSIS — Z961 Presence of intraocular lens: Secondary | ICD-10-CM | POA: Diagnosis not present

## 2017-02-14 DIAGNOSIS — Z7982 Long term (current) use of aspirin: Secondary | ICD-10-CM | POA: Diagnosis not present

## 2017-02-14 DIAGNOSIS — C9000 Multiple myeloma not having achieved remission: Secondary | ICD-10-CM | POA: Diagnosis not present

## 2017-02-14 DIAGNOSIS — K219 Gastro-esophageal reflux disease without esophagitis: Secondary | ICD-10-CM | POA: Diagnosis not present

## 2017-02-14 DIAGNOSIS — T63481D Toxic effect of venom of other arthropod, accidental (unintentional), subsequent encounter: Secondary | ICD-10-CM | POA: Diagnosis not present

## 2017-02-14 DIAGNOSIS — Z79899 Other long term (current) drug therapy: Secondary | ICD-10-CM | POA: Diagnosis not present

## 2017-02-14 DIAGNOSIS — F419 Anxiety disorder, unspecified: Secondary | ICD-10-CM | POA: Diagnosis not present

## 2017-02-14 DIAGNOSIS — Z7951 Long term (current) use of inhaled steroids: Secondary | ICD-10-CM | POA: Diagnosis not present

## 2017-02-14 DIAGNOSIS — J45909 Unspecified asthma, uncomplicated: Secondary | ICD-10-CM | POA: Diagnosis not present

## 2017-02-14 DIAGNOSIS — Z85820 Personal history of malignant melanoma of skin: Secondary | ICD-10-CM | POA: Diagnosis not present

## 2017-02-14 DIAGNOSIS — Z85850 Personal history of malignant neoplasm of thyroid: Secondary | ICD-10-CM | POA: Diagnosis not present

## 2017-02-14 DIAGNOSIS — Z6826 Body mass index (BMI) 26.0-26.9, adult: Secondary | ICD-10-CM | POA: Diagnosis not present

## 2017-02-14 DIAGNOSIS — Z91040 Latex allergy status: Secondary | ICD-10-CM | POA: Diagnosis not present

## 2017-02-14 DIAGNOSIS — E7800 Pure hypercholesterolemia, unspecified: Secondary | ICD-10-CM | POA: Diagnosis not present

## 2017-02-14 DIAGNOSIS — I341 Nonrheumatic mitral (valve) prolapse: Secondary | ICD-10-CM | POA: Diagnosis not present

## 2017-02-14 DIAGNOSIS — R3129 Other microscopic hematuria: Secondary | ICD-10-CM | POA: Diagnosis not present

## 2017-02-14 DIAGNOSIS — K769 Liver disease, unspecified: Secondary | ICD-10-CM | POA: Diagnosis not present

## 2017-02-14 DIAGNOSIS — E039 Hypothyroidism, unspecified: Secondary | ICD-10-CM | POA: Diagnosis not present

## 2017-02-14 DIAGNOSIS — K5730 Diverticulosis of large intestine without perforation or abscess without bleeding: Secondary | ICD-10-CM | POA: Diagnosis not present

## 2017-02-14 DIAGNOSIS — D126 Benign neoplasm of colon, unspecified: Secondary | ICD-10-CM | POA: Diagnosis not present

## 2017-02-14 DIAGNOSIS — N6019 Diffuse cystic mastopathy of unspecified breast: Secondary | ICD-10-CM | POA: Diagnosis not present

## 2017-02-14 DIAGNOSIS — L0390 Cellulitis, unspecified: Secondary | ICD-10-CM | POA: Diagnosis not present

## 2017-02-14 DIAGNOSIS — I119 Hypertensive heart disease without heart failure: Secondary | ICD-10-CM | POA: Diagnosis not present

## 2017-02-14 DIAGNOSIS — M79672 Pain in left foot: Secondary | ICD-10-CM | POA: Diagnosis not present

## 2017-02-14 DIAGNOSIS — M79671 Pain in right foot: Secondary | ICD-10-CM | POA: Diagnosis not present

## 2017-02-15 DIAGNOSIS — R21 Rash and other nonspecific skin eruption: Secondary | ICD-10-CM | POA: Diagnosis not present

## 2017-02-15 DIAGNOSIS — W57XXXD Bitten or stung by nonvenomous insect and other nonvenomous arthropods, subsequent encounter: Secondary | ICD-10-CM | POA: Diagnosis not present

## 2017-02-16 ENCOUNTER — Ambulatory Visit (INDEPENDENT_AMBULATORY_CARE_PROVIDER_SITE_OTHER): Payer: Medicare Other | Admitting: Pulmonary Disease

## 2017-02-16 ENCOUNTER — Encounter: Payer: Self-pay | Admitting: Pulmonary Disease

## 2017-02-16 VITALS — BP 140/90 | HR 71 | Ht 61.0 in | Wt 139.0 lb

## 2017-02-16 DIAGNOSIS — K219 Gastro-esophageal reflux disease without esophagitis: Secondary | ICD-10-CM | POA: Diagnosis not present

## 2017-02-16 DIAGNOSIS — J45909 Unspecified asthma, uncomplicated: Secondary | ICD-10-CM | POA: Diagnosis not present

## 2017-02-16 DIAGNOSIS — J309 Allergic rhinitis, unspecified: Secondary | ICD-10-CM

## 2017-02-16 NOTE — Patient Instructions (Signed)
   Start back taking your Allegra as prescribed. You should have a prescription at your pharmacy but if they need a new one or any refills just have them contact me.  Start back taking your Flonase as prescribed. You can buy this over the counter. Use it as directed - 1 spray in each nostril twice daily.  I will see you back in 3 months or sooner if needed. Call me if you have any questions or concerns.
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
## 2017-02-23 DIAGNOSIS — R21 Rash and other nonspecific skin eruption: Secondary | ICD-10-CM | POA: Diagnosis not present

## 2017-02-23 DIAGNOSIS — W57XXXA Bitten or stung by nonvenomous insect and other nonvenomous arthropods, initial encounter: Secondary | ICD-10-CM | POA: Diagnosis not present

## 2017-02-24 DIAGNOSIS — S80862A Insect bite (nonvenomous), left lower leg, initial encounter: Secondary | ICD-10-CM | POA: Diagnosis not present

## 2017-02-24 DIAGNOSIS — Z85828 Personal history of other malignant neoplasm of skin: Secondary | ICD-10-CM | POA: Diagnosis not present

## 2017-03-05 DIAGNOSIS — S30860A Insect bite (nonvenomous) of lower back and pelvis, initial encounter: Secondary | ICD-10-CM | POA: Diagnosis not present

## 2017-03-05 DIAGNOSIS — S30861A Insect bite (nonvenomous) of abdominal wall, initial encounter: Secondary | ICD-10-CM | POA: Diagnosis not present

## 2017-03-08 DIAGNOSIS — S20479A Other superficial bite of unspecified back wall of thorax, initial encounter: Secondary | ICD-10-CM | POA: Diagnosis not present

## 2017-03-08 DIAGNOSIS — K769 Liver disease, unspecified: Secondary | ICD-10-CM | POA: Diagnosis not present

## 2017-03-08 DIAGNOSIS — C9000 Multiple myeloma not having achieved remission: Secondary | ICD-10-CM | POA: Diagnosis not present

## 2017-03-08 DIAGNOSIS — S01511A Laceration without foreign body of lip, initial encounter: Secondary | ICD-10-CM | POA: Diagnosis not present

## 2017-03-10 DIAGNOSIS — B351 Tinea unguium: Secondary | ICD-10-CM | POA: Diagnosis not present

## 2017-03-16 DIAGNOSIS — H04123 Dry eye syndrome of bilateral lacrimal glands: Secondary | ICD-10-CM | POA: Diagnosis not present

## 2017-03-16 DIAGNOSIS — H01004 Unspecified blepharitis left upper eyelid: Secondary | ICD-10-CM | POA: Diagnosis not present

## 2017-03-16 DIAGNOSIS — H01001 Unspecified blepharitis right upper eyelid: Secondary | ICD-10-CM | POA: Diagnosis not present

## 2017-03-21 DIAGNOSIS — K5730 Diverticulosis of large intestine without perforation or abscess without bleeding: Secondary | ICD-10-CM | POA: Diagnosis not present

## 2017-03-21 DIAGNOSIS — Z885 Allergy status to narcotic agent status: Secondary | ICD-10-CM | POA: Diagnosis not present

## 2017-03-21 DIAGNOSIS — F419 Anxiety disorder, unspecified: Secondary | ICD-10-CM | POA: Diagnosis not present

## 2017-03-21 DIAGNOSIS — M79671 Pain in right foot: Secondary | ICD-10-CM | POA: Diagnosis not present

## 2017-03-21 DIAGNOSIS — M79622 Pain in left upper arm: Secondary | ICD-10-CM | POA: Diagnosis not present

## 2017-03-21 DIAGNOSIS — N6019 Diffuse cystic mastopathy of unspecified breast: Secondary | ICD-10-CM | POA: Diagnosis not present

## 2017-03-21 DIAGNOSIS — I341 Nonrheumatic mitral (valve) prolapse: Secondary | ICD-10-CM | POA: Diagnosis not present

## 2017-03-21 DIAGNOSIS — E7800 Pure hypercholesterolemia, unspecified: Secondary | ICD-10-CM | POA: Diagnosis not present

## 2017-03-21 DIAGNOSIS — M79672 Pain in left foot: Secondary | ICD-10-CM | POA: Diagnosis not present

## 2017-03-21 DIAGNOSIS — Z961 Presence of intraocular lens: Secondary | ICD-10-CM | POA: Diagnosis not present

## 2017-03-21 DIAGNOSIS — C439 Malignant melanoma of skin, unspecified: Secondary | ICD-10-CM | POA: Diagnosis not present

## 2017-03-21 DIAGNOSIS — C9000 Multiple myeloma not having achieved remission: Secondary | ICD-10-CM | POA: Diagnosis not present

## 2017-03-21 DIAGNOSIS — M7989 Other specified soft tissue disorders: Secondary | ICD-10-CM | POA: Diagnosis not present

## 2017-03-21 DIAGNOSIS — Z6825 Body mass index (BMI) 25.0-25.9, adult: Secondary | ICD-10-CM | POA: Diagnosis not present

## 2017-03-21 DIAGNOSIS — I119 Hypertensive heart disease without heart failure: Secondary | ICD-10-CM | POA: Diagnosis not present

## 2017-03-21 DIAGNOSIS — C9001 Multiple myeloma in remission: Secondary | ICD-10-CM | POA: Diagnosis not present

## 2017-03-21 DIAGNOSIS — E039 Hypothyroidism, unspecified: Secondary | ICD-10-CM | POA: Diagnosis not present

## 2017-03-21 DIAGNOSIS — J45909 Unspecified asthma, uncomplicated: Secondary | ICD-10-CM | POA: Diagnosis not present

## 2017-03-21 DIAGNOSIS — K219 Gastro-esophageal reflux disease without esophagitis: Secondary | ICD-10-CM | POA: Diagnosis not present

## 2017-03-21 DIAGNOSIS — R319 Hematuria, unspecified: Secondary | ICD-10-CM | POA: Diagnosis not present

## 2017-03-21 DIAGNOSIS — C73 Malignant neoplasm of thyroid gland: Secondary | ICD-10-CM | POA: Diagnosis not present

## 2017-03-21 DIAGNOSIS — R3129 Other microscopic hematuria: Secondary | ICD-10-CM | POA: Diagnosis not present

## 2017-03-21 DIAGNOSIS — D126 Benign neoplasm of colon, unspecified: Secondary | ICD-10-CM | POA: Diagnosis not present

## 2017-03-25 DIAGNOSIS — M7989 Other specified soft tissue disorders: Secondary | ICD-10-CM | POA: Diagnosis not present

## 2017-03-25 DIAGNOSIS — R2232 Localized swelling, mass and lump, left upper limb: Secondary | ICD-10-CM | POA: Diagnosis not present

## 2017-03-25 DIAGNOSIS — M79622 Pain in left upper arm: Secondary | ICD-10-CM | POA: Diagnosis not present

## 2017-03-25 DIAGNOSIS — C9000 Multiple myeloma not having achieved remission: Secondary | ICD-10-CM | POA: Diagnosis not present

## 2017-03-31 ENCOUNTER — Telehealth: Payer: Self-pay

## 2017-03-31 NOTE — Telephone Encounter (Signed)
Pt called that she has a new dx of liver cancer. She has been going to [place] for [place] for the last 2 years for multiple myeloma. She has been seeing Dr Baird Cancer. She wants to be closer to home. She was last seen at [facility] January 2015. Instructed her to have Dr Baird Cancer refer her to [facility] for second opinion.

## 2017-04-01 DIAGNOSIS — L820 Inflamed seborrheic keratosis: Secondary | ICD-10-CM | POA: Diagnosis not present

## 2017-04-01 DIAGNOSIS — L218 Other seborrheic dermatitis: Secondary | ICD-10-CM | POA: Diagnosis not present

## 2017-04-01 DIAGNOSIS — Z1283 Encounter for screening for malignant neoplasm of skin: Secondary | ICD-10-CM | POA: Diagnosis not present

## 2017-04-01 DIAGNOSIS — D225 Melanocytic nevi of trunk: Secondary | ICD-10-CM | POA: Diagnosis not present

## 2017-04-04 DIAGNOSIS — C9000 Multiple myeloma not having achieved remission: Secondary | ICD-10-CM | POA: Diagnosis not present

## 2017-04-04 DIAGNOSIS — Z5112 Encounter for antineoplastic immunotherapy: Secondary | ICD-10-CM | POA: Diagnosis not present

## 2017-04-04 DIAGNOSIS — Z6825 Body mass index (BMI) 25.0-25.9, adult: Secondary | ICD-10-CM | POA: Diagnosis not present

## 2017-04-04 DIAGNOSIS — R300 Dysuria: Secondary | ICD-10-CM | POA: Diagnosis not present

## 2017-04-15 DIAGNOSIS — M545 Low back pain: Secondary | ICD-10-CM | POA: Diagnosis not present

## 2017-04-18 DIAGNOSIS — C9000 Multiple myeloma not having achieved remission: Secondary | ICD-10-CM | POA: Diagnosis not present

## 2017-04-18 DIAGNOSIS — R6889 Other general symptoms and signs: Secondary | ICD-10-CM | POA: Diagnosis not present

## 2017-04-18 DIAGNOSIS — D472 Monoclonal gammopathy: Secondary | ICD-10-CM | POA: Diagnosis not present

## 2017-04-19 DIAGNOSIS — K769 Liver disease, unspecified: Secondary | ICD-10-CM | POA: Diagnosis not present

## 2017-04-19 DIAGNOSIS — E7800 Pure hypercholesterolemia, unspecified: Secondary | ICD-10-CM | POA: Diagnosis not present

## 2017-04-19 DIAGNOSIS — Z885 Allergy status to narcotic agent status: Secondary | ICD-10-CM | POA: Diagnosis not present

## 2017-04-19 DIAGNOSIS — Z7982 Long term (current) use of aspirin: Secondary | ICD-10-CM | POA: Diagnosis not present

## 2017-04-19 DIAGNOSIS — Z881 Allergy status to other antibiotic agents status: Secondary | ICD-10-CM | POA: Diagnosis not present

## 2017-04-19 DIAGNOSIS — Z79899 Other long term (current) drug therapy: Secondary | ICD-10-CM | POA: Diagnosis not present

## 2017-04-19 DIAGNOSIS — Z6825 Body mass index (BMI) 25.0-25.9, adult: Secondary | ICD-10-CM | POA: Diagnosis not present

## 2017-04-19 DIAGNOSIS — Z888 Allergy status to other drugs, medicaments and biological substances status: Secondary | ICD-10-CM | POA: Diagnosis not present

## 2017-04-19 DIAGNOSIS — C9000 Multiple myeloma not having achieved remission: Secondary | ICD-10-CM | POA: Diagnosis not present

## 2017-04-19 DIAGNOSIS — K219 Gastro-esophageal reflux disease without esophagitis: Secondary | ICD-10-CM | POA: Diagnosis not present

## 2017-04-19 DIAGNOSIS — C6991 Malignant neoplasm of unspecified site of right eye: Secondary | ICD-10-CM | POA: Diagnosis not present

## 2017-04-19 DIAGNOSIS — L0292 Furuncle, unspecified: Secondary | ICD-10-CM | POA: Diagnosis not present

## 2017-04-25 DIAGNOSIS — L308 Other specified dermatitis: Secondary | ICD-10-CM | POA: Diagnosis not present

## 2017-05-03 DIAGNOSIS — G5702 Lesion of sciatic nerve, left lower limb: Secondary | ICD-10-CM | POA: Diagnosis not present

## 2017-05-03 DIAGNOSIS — Z5112 Encounter for antineoplastic immunotherapy: Secondary | ICD-10-CM | POA: Diagnosis not present

## 2017-05-03 DIAGNOSIS — C9000 Multiple myeloma not having achieved remission: Secondary | ICD-10-CM | POA: Diagnosis not present

## 2017-05-05 DIAGNOSIS — M5442 Lumbago with sciatica, left side: Secondary | ICD-10-CM | POA: Diagnosis not present

## 2017-05-08 DIAGNOSIS — H1013 Acute atopic conjunctivitis, bilateral: Secondary | ICD-10-CM | POA: Diagnosis not present

## 2017-05-08 DIAGNOSIS — J3089 Other allergic rhinitis: Secondary | ICD-10-CM | POA: Diagnosis not present

## 2017-05-09 DIAGNOSIS — M5442 Lumbago with sciatica, left side: Secondary | ICD-10-CM | POA: Diagnosis not present

## 2017-05-10 DIAGNOSIS — F419 Anxiety disorder, unspecified: Secondary | ICD-10-CM | POA: Diagnosis not present

## 2017-05-10 DIAGNOSIS — Z85850 Personal history of malignant neoplasm of thyroid: Secondary | ICD-10-CM | POA: Diagnosis not present

## 2017-05-10 DIAGNOSIS — I10 Essential (primary) hypertension: Secondary | ICD-10-CM | POA: Diagnosis not present

## 2017-05-10 DIAGNOSIS — Z85840 Personal history of malignant neoplasm of eye: Secondary | ICD-10-CM | POA: Diagnosis not present

## 2017-05-10 DIAGNOSIS — C9000 Multiple myeloma not having achieved remission: Secondary | ICD-10-CM | POA: Diagnosis not present

## 2017-05-10 DIAGNOSIS — K760 Fatty (change of) liver, not elsewhere classified: Secondary | ICD-10-CM | POA: Diagnosis not present

## 2017-05-10 DIAGNOSIS — E890 Postprocedural hypothyroidism: Secondary | ICD-10-CM | POA: Diagnosis not present

## 2017-05-10 DIAGNOSIS — J45909 Unspecified asthma, uncomplicated: Secondary | ICD-10-CM | POA: Diagnosis not present

## 2017-05-10 DIAGNOSIS — Z01818 Encounter for other preprocedural examination: Secondary | ICD-10-CM | POA: Diagnosis not present

## 2017-05-10 DIAGNOSIS — K219 Gastro-esophageal reflux disease without esophagitis: Secondary | ICD-10-CM | POA: Diagnosis not present

## 2017-05-10 DIAGNOSIS — R160 Hepatomegaly, not elsewhere classified: Secondary | ICD-10-CM | POA: Diagnosis not present

## 2017-05-10 DIAGNOSIS — Z8673 Personal history of transient ischemic attack (TIA), and cerebral infarction without residual deficits: Secondary | ICD-10-CM | POA: Diagnosis not present

## 2017-05-10 DIAGNOSIS — Z85820 Personal history of malignant melanoma of skin: Secondary | ICD-10-CM | POA: Diagnosis not present

## 2017-05-10 DIAGNOSIS — K660 Peritoneal adhesions (postprocedural) (postinfection): Secondary | ICD-10-CM | POA: Diagnosis not present

## 2017-05-11 ENCOUNTER — Ambulatory Visit (INDEPENDENT_AMBULATORY_CARE_PROVIDER_SITE_OTHER): Payer: Medicare Other | Admitting: Podiatry

## 2017-05-11 ENCOUNTER — Encounter: Payer: Self-pay | Admitting: Podiatry

## 2017-05-11 DIAGNOSIS — M79609 Pain in unspecified limb: Secondary | ICD-10-CM | POA: Diagnosis not present

## 2017-05-11 DIAGNOSIS — B351 Tinea unguium: Secondary | ICD-10-CM | POA: Diagnosis not present

## 2017-05-11 NOTE — Progress Notes (Signed)
Subjective:    Patient ID: [name], female   DOB: 76 y.o.   MRN: [id number]   HPI patient presents with incurvated E long gaited nailbeds 1-5 both feet that are thick yellow brittle and painful    ROS      Objective:  Physical Exam neurovascular status unchanged with thick yellow brittle nailbeds 1-5 both feet that are painful when palpated     Assessment:    Painful mycotic nails bilateral     Plan:    Debrided nailbeds 1-5 both feet with no iatrogenic bleeding noted

## 2017-05-12 DIAGNOSIS — M5442 Lumbago with sciatica, left side: Secondary | ICD-10-CM | POA: Diagnosis not present

## 2017-05-17 DIAGNOSIS — K660 Peritoneal adhesions (postprocedural) (postinfection): Secondary | ICD-10-CM | POA: Diagnosis not present

## 2017-05-17 DIAGNOSIS — Z85840 Personal history of malignant neoplasm of eye: Secondary | ICD-10-CM | POA: Diagnosis not present

## 2017-05-17 DIAGNOSIS — K7689 Other specified diseases of liver: Secondary | ICD-10-CM | POA: Diagnosis not present

## 2017-05-17 DIAGNOSIS — Z923 Personal history of irradiation: Secondary | ICD-10-CM | POA: Diagnosis not present

## 2017-05-17 DIAGNOSIS — F419 Anxiety disorder, unspecified: Secondary | ICD-10-CM | POA: Diagnosis not present

## 2017-05-17 DIAGNOSIS — K760 Fatty (change of) liver, not elsewhere classified: Secondary | ICD-10-CM | POA: Diagnosis present

## 2017-05-17 DIAGNOSIS — C9000 Multiple myeloma not having achieved remission: Secondary | ICD-10-CM | POA: Diagnosis not present

## 2017-05-17 DIAGNOSIS — J45909 Unspecified asthma, uncomplicated: Secondary | ICD-10-CM | POA: Diagnosis not present

## 2017-05-17 DIAGNOSIS — I10 Essential (primary) hypertension: Secondary | ICD-10-CM | POA: Diagnosis not present

## 2017-05-17 DIAGNOSIS — R160 Hepatomegaly, not elsewhere classified: Secondary | ICD-10-CM | POA: Diagnosis not present

## 2017-05-17 DIAGNOSIS — Z85850 Personal history of malignant neoplasm of thyroid: Secondary | ICD-10-CM | POA: Diagnosis not present

## 2017-05-17 DIAGNOSIS — K219 Gastro-esophageal reflux disease without esophagitis: Secondary | ICD-10-CM | POA: Diagnosis not present

## 2017-05-17 DIAGNOSIS — K769 Liver disease, unspecified: Secondary | ICD-10-CM | POA: Diagnosis not present

## 2017-05-17 DIAGNOSIS — Z85820 Personal history of malignant melanoma of skin: Secondary | ICD-10-CM | POA: Diagnosis not present

## 2017-05-17 DIAGNOSIS — Z8673 Personal history of transient ischemic attack (TIA), and cerebral infarction without residual deficits: Secondary | ICD-10-CM | POA: Diagnosis not present

## 2017-05-17 DIAGNOSIS — E890 Postprocedural hypothyroidism: Secondary | ICD-10-CM | POA: Diagnosis not present

## 2017-05-19 ENCOUNTER — Telehealth: Payer: Self-pay | Admitting: Nurse Practitioner

## 2017-05-19 NOTE — Telephone Encounter (Signed)
Spoke with pt and pt was discharged from [facility] yesterday after liver surgery,Per pt's instructions was told to take Simvastatin every am  Instructed pt to take in pm  But should call discharging md to make sure needs to restart med at this time last liver labs on file are from last year the patient verbalized understanding ./cy

## 2017-05-19 NOTE — Telephone Encounter (Signed)
Patient calling, states that she was discharged from hospital from having liver surgery. Patient states that she received conflicting instructions with medication and would like to verify dosage.

## 2017-05-21 ENCOUNTER — Emergency Department (HOSPITAL_COMMUNITY): Payer: Medicare Other

## 2017-05-21 ENCOUNTER — Emergency Department (HOSPITAL_COMMUNITY)
Admission: EM | Admit: 2017-05-21 | Discharge: 2017-05-21 | Disposition: A | Discharge status: Other Acute Inpatient Hospital | Payer: Medicare Other | Attending: Emergency Medicine | Admitting: Emergency Medicine

## 2017-05-21 ENCOUNTER — Encounter (HOSPITAL_COMMUNITY): Payer: Self-pay | Admitting: Emergency Medicine

## 2017-05-21 DIAGNOSIS — Z9889 Other specified postprocedural states: Secondary | ICD-10-CM | POA: Diagnosis not present

## 2017-05-21 DIAGNOSIS — R51 Headache: Secondary | ICD-10-CM | POA: Diagnosis not present

## 2017-05-21 DIAGNOSIS — S301XXA Contusion of abdominal wall, initial encounter: Secondary | ICD-10-CM | POA: Diagnosis not present

## 2017-05-21 DIAGNOSIS — Z85840 Personal history of malignant neoplasm of eye: Secondary | ICD-10-CM | POA: Diagnosis not present

## 2017-05-21 DIAGNOSIS — Z881 Allergy status to other antibiotic agents status: Secondary | ICD-10-CM | POA: Diagnosis not present

## 2017-05-21 DIAGNOSIS — R531 Weakness: Secondary | ICD-10-CM | POA: Diagnosis not present

## 2017-05-21 DIAGNOSIS — R110 Nausea: Secondary | ICD-10-CM | POA: Diagnosis not present

## 2017-05-21 DIAGNOSIS — H9203 Otalgia, bilateral: Secondary | ICD-10-CM | POA: Diagnosis present

## 2017-05-21 DIAGNOSIS — R160 Hepatomegaly, not elsewhere classified: Secondary | ICD-10-CM | POA: Insufficient documentation

## 2017-05-21 DIAGNOSIS — Z85820 Personal history of malignant melanoma of skin: Secondary | ICD-10-CM | POA: Diagnosis not present

## 2017-05-21 DIAGNOSIS — J302 Other seasonal allergic rhinitis: Secondary | ICD-10-CM | POA: Diagnosis present

## 2017-05-21 DIAGNOSIS — Z85850 Personal history of malignant neoplasm of thyroid: Secondary | ICD-10-CM | POA: Diagnosis not present

## 2017-05-21 DIAGNOSIS — Z8673 Personal history of transient ischemic attack (TIA), and cerebral infarction without residual deficits: Secondary | ICD-10-CM | POA: Diagnosis not present

## 2017-05-21 DIAGNOSIS — E890 Postprocedural hypothyroidism: Secondary | ICD-10-CM | POA: Diagnosis not present

## 2017-05-21 DIAGNOSIS — R1084 Generalized abdominal pain: Secondary | ICD-10-CM | POA: Insufficient documentation

## 2017-05-21 DIAGNOSIS — Z049 Encounter for examination and observation for unspecified reason: Secondary | ICD-10-CM | POA: Diagnosis not present

## 2017-05-21 DIAGNOSIS — D7389 Other diseases of spleen: Secondary | ICD-10-CM | POA: Diagnosis not present

## 2017-05-21 DIAGNOSIS — C9000 Multiple myeloma not having achieved remission: Secondary | ICD-10-CM | POA: Diagnosis not present

## 2017-05-21 DIAGNOSIS — R42 Dizziness and giddiness: Secondary | ICD-10-CM | POA: Diagnosis not present

## 2017-05-21 DIAGNOSIS — F419 Anxiety disorder, unspecified: Secondary | ICD-10-CM | POA: Diagnosis present

## 2017-05-21 DIAGNOSIS — H5461 Unqualified visual loss, right eye, normal vision left eye: Secondary | ICD-10-CM | POA: Diagnosis present

## 2017-05-21 DIAGNOSIS — I10 Essential (primary) hypertension: Secondary | ICD-10-CM | POA: Diagnosis present

## 2017-05-21 DIAGNOSIS — Z91040 Latex allergy status: Secondary | ICD-10-CM | POA: Diagnosis not present

## 2017-05-21 DIAGNOSIS — I700 Atherosclerosis of aorta: Secondary | ICD-10-CM | POA: Diagnosis not present

## 2017-05-21 DIAGNOSIS — Z79899 Other long term (current) drug therapy: Secondary | ICD-10-CM | POA: Diagnosis not present

## 2017-05-21 DIAGNOSIS — K7689 Other specified diseases of liver: Secondary | ICD-10-CM | POA: Diagnosis not present

## 2017-05-21 DIAGNOSIS — K862 Cyst of pancreas: Secondary | ICD-10-CM | POA: Diagnosis not present

## 2017-05-21 DIAGNOSIS — K91870 Postprocedural hematoma of a digestive system organ or structure following a digestive system procedure: Secondary | ICD-10-CM | POA: Diagnosis not present

## 2017-05-21 DIAGNOSIS — R109 Unspecified abdominal pain: Secondary | ICD-10-CM | POA: Diagnosis not present

## 2017-05-21 DIAGNOSIS — Z888 Allergy status to other drugs, medicaments and biological substances status: Secondary | ICD-10-CM | POA: Diagnosis not present

## 2017-05-21 LAB — CBC WITH DIFFERENTIAL/PLATELET
BASOS ABS: 0 10*3/uL (ref 0.0–0.1)
BASOS PCT: 0 %
Eosinophils Absolute: 0.7 10*3/uL (ref 0.0–0.7)
Eosinophils Relative: 7 %
HEMATOCRIT: 19.4 % — AB (ref 36.0–46.0)
HEMOGLOBIN: 6.8 g/dL — AB (ref 12.0–15.0)
LYMPHS PCT: 11 %
Lymphs Abs: 1.3 10*3/uL (ref 0.7–4.0)
MCH: 30.5 pg (ref 26.0–34.0)
MCHC: 35.1 g/dL (ref 30.0–36.0)
MCV: 87 fL (ref 78.0–100.0)
MONO ABS: 1.2 10*3/uL — AB (ref 0.1–1.0)
MONOS PCT: 11 %
NEUTROS ABS: 7.8 10*3/uL — AB (ref 1.7–7.7)
NEUTROS PCT: 71 %
Platelets: 223 10*3/uL (ref 150–400)
RBC: 2.23 MIL/uL — ABNORMAL LOW (ref 3.87–5.11)
RDW: 13.8 % (ref 11.5–15.5)
WBC: 11 10*3/uL — ABNORMAL HIGH (ref 4.0–10.5)

## 2017-05-21 LAB — COMPREHENSIVE METABOLIC PANEL
ALBUMIN: 3.3 g/dL — AB (ref 3.5–5.0)
ALK PHOS: 47 U/L (ref 38–126)
ALT: 128 U/L — ABNORMAL HIGH (ref 14–54)
AST: 48 U/L — AB (ref 15–41)
Anion gap: 9 (ref 5–15)
BILIRUBIN TOTAL: 1 mg/dL (ref 0.3–1.2)
BUN: 12 mg/dL (ref 6–20)
CALCIUM: 8.2 mg/dL — AB (ref 8.9–10.3)
CO2: 26 mmol/L (ref 22–32)
Chloride: 95 mmol/L — ABNORMAL LOW (ref 101–111)
Creatinine, Ser: 0.73 mg/dL (ref 0.44–1.00)
GFR calc Af Amer: >60 mL/min (ref >60)
GLUCOSE: 105 mg/dL — AB (ref 65–99)
Potassium: 3.5 mmol/L (ref 3.5–5.1)
Sodium: 130 mmol/L — ABNORMAL LOW (ref 135–145)
TOTAL PROTEIN: 7 g/dL (ref 6.5–8.1)

## 2017-05-21 LAB — PREPARE RBC (CROSSMATCH)

## 2017-05-21 LAB — PROTIME-INR
INR: 1.08
PROTHROMBIN TIME: 14 s (ref 11.4–15.2)

## 2017-05-21 LAB — ABO/RH: ABO/RH(D): O POS

## 2017-05-21 MED ORDER — SODIUM CHLORIDE 0.9 % IV SOLN: Freq: Once | INTRAVENOUS | Status: DC

## 2017-05-21 MED ORDER — SODIUM CHLORIDE 0.9 % IV SOLN
Freq: Once | INTRAVENOUS | Status: AC
  Administered 2017-05-21: 16:00:00 via INTRAVENOUS

## 2017-05-21 MED ORDER — IOPAMIDOL (ISOVUE-300) INJECTION 61%
100.0000 mL | Freq: Once | INTRAVENOUS | Status: AC | PRN
  Administered 2017-05-21: 16:00:00 via INTRAVENOUS
  Administered 2017-05-21: 100 mL via INTRAVENOUS

## 2017-05-21 MED ORDER — IOPAMIDOL (ISOVUE-300) INJECTION 61%
INTRAVENOUS | Status: AC
  Filled 2017-05-21: qty 100

## 2017-05-21 NOTE — ED Notes (Signed)
She has just left with [facility] without incident; and I have just phoned report to [place] at [facility].

## 2017-05-21 NOTE — ED Triage Notes (Signed)
Patient complaining of hearing her heart beat in her ears. Patient states it worries her. She is a cancer patient that had a liver biopsy on Tuesday. Patient is not in pain but her abdominal area is purple and blue.

## 2017-05-21 NOTE — ED Provider Notes (Signed)
[place] DEPT Provider Note   CSN: [id number] Arrival date & time: 05/21/17  1761     History   Chief Complaint Chief Complaint  Patient presents with  . Ear Problem    HPI [name] is a 76 y.o. female. Chief complaint is ear problem  HPI:  76 year old female. History of multiple myeloma/monoclonal gammopathy of uncertain significance status post chemotherapy. Distant history of resected local melanoma.  Recent CT scan demonstrated liver mass. Referred to surgeon and underwent laparoscopic/robotic assisted excisional biopsy at [facility] in [place], [place]. [place] on 8/14. Home on Wed 8/15.  Developed  bruising and ecchymosis along her right abdominal wall. His had a poor appetite. No nausea or vomiting. Weakness. As hadOrthostasis and ended up on the floor this morning without pain or injury.  She states that whenever she sits or stands she feels pressure in both ears like a "whooshing" sensation.  Past Medical History:  Diagnosis Date  . Anxiety   . Arthritis   . Asthma   . Cancer (HCC)    THYROID, SKIN, NOSE, BONE  . Chronic kidney disease   . Diverticulosis    Pt reported on 03/15/12  . Eye problems    LOST VISION RIGHT EYE  . Hernia   . History of blood clots    LEG  . Hyperlipidemia   . Hypertension   . Melanoma ([place])   . Melanoma ([place]) 1989   chest  . MGUS (monoclonal gammopathy of unknown significance)   . MGUS (monoclonal gammopathy of unknown significance)   . Mitral valve prolapse   . Perforated bowel ([place])   . Peritonitis ([place])   . Stroke ([place])   . Thyroid disease     Patient Active Problem List   Diagnosis Date Noted  . Periorbital edema 01/22/2015  . Allergic rhinitis 08/12/2014  . Anxiety 07/04/2014  . 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[place]) 07/04/2014  . Multiple myeloma without remission ([place]) 11/01/2013  . Cerebral vascular accident ([place]) 02/19/2013  . Bloodgood disease 02/19/2013  . Acid reflux 02/19/2013  .  Blood in the urine 02/19/2013  . Billowing mitral valve 02/19/2013  . Adenomatous colon polyp 02/19/2013  . 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 Cataract, nuclear 12/16/2011  . Intragel vitreous hemorrhage ([place]) 12/16/2011  . Age-related macular degeneration, dry 12/02/2011  . Post-radiation retinopathy 12/02/2011  . Pseudophakia 12/02/2011  . MGUS (monoclonal gammopathy of unknown significance) 11/26/2011  . 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 Extrinsic asthma 09/21/2011  . Colon, diverticulosis 07/26/2011  . Benign hypertensive heart disease without heart failure 03/31/2011  . Hypercholesterolemia 03/31/2011  . Hypothyroidism 03/31/2011  . History of melanoma 03/31/2011  . Hx of thyroid cancer 03/31/2011  . H/O malignant melanoma of skin 03/31/2011    Past Surgical History:  Procedure Laterality Date  . ABDOMINAL HYSTERECTOMY    . BREAST LUMPECTOMY  12/2010   R breast  . BREAST LUMPECTOMY  2004   L axilla neg for cancer.  [name] CARDIOVASCULAR STRESS TEST  2006   NORMAL  . HERNIA REPAIR    . LEG SURGERY     VEIN & BLOOD CLOT  . MELANOMA EXCISION     R eye  . ROTATOR CUFF REPAIR  2004   RIGHT SHOULDER  . SKIN CANCER EXCISION  2011, 2013   squamous cell of nose x 2  . TRANSTHORACIC ECHOCARDIOGRAM  2006    OB History    No data available  Home Medications    Prior to Admission medications   Medication Sig Start Date End Date Taking? Authorizing Provider  acetaminophen (TYLENOL) 500 MG tablet Take 250 mg by mouth every 6 (six) hours as needed for moderate pain.   Yes [provider]  ALPRAZolam Duanne Moron) 0.5 MG tablet Take 0.5 mg by mouth daily as needed for anxiety.  07/26/11  Yes [provider]  amLODipine (NORVASC) 5 MG tablet Take 1 tablet (5 mg total) by mouth daily. 01/05/17  Yes Burtis Junes, NP  aspirin 81 MG chewable tablet Chew 81 mg by mouth at bedtime.  07/26/11  Yes [provider]  azelastine  (ASTELIN) 0.1 % nasal spray Place 1 spray into both nostrils daily. 05/08/17  Yes [provider]  clonazePAM (KLONOPIN) 0.5 MG tablet Take 0.25-0.5 mg by mouth at bedtime as needed for anxiety.  05/06/12  Yes [provider]  fexofenadine (ALLEGRA) 180 MG tablet Take 180 mg by mouth daily.    Yes [provider]  fluticasone (FLONASE) 50 MCG/ACT nasal spray Place 1 spray into both nostrils 2 (two) times daily. 07/09/15  Yes Javier Glazier, MD  levothyroxine (SYNTHROID, LEVOTHROID) 112 MCG tablet Take 112 mcg by mouth See admin instructions. Take 1 tablet by mouth daily on weekends only. 11/13/15  Yes [provider]  levothyroxine (SYNTHROID, LEVOTHROID) 125 MCG tablet Take 125 mcg by mouth daily. Take 1 tablet daily during the week only. 05/13/17  Yes [provider]  MICARDIS 40 MG tablet Take 1 tablet (40 mg total) by mouth daily. 01/05/17  Yes Burtis Junes, NP  Multiple Vitamins-Minerals (PRESERVISION AREDS PO) Take 1 capsule by mouth 2 (two) times daily.   Yes [provider]  nystatin ointment (MYCOSTATIN) Apply 1 application topically 2 (two) times daily. As directed 09/12/16  Yes Lacretia Leigh, MD  Olopatadine HCl 0.7 % SOLN Place 1 drop into the left eye daily. Reported on 12/02/2015   Yes [provider]  ondansetron (ZOFRAN) 4 MG tablet Take 4 mg by mouth every 8 (eight) hours as needed. 05/19/17  Yes [provider]  Polyvinyl Alcohol-Povidone (REFRESH OP) Apply 1 drop to eye as needed (dry eye).   Yes [provider]  simvastatin (ZOCOR) 20 MG tablet TAKE 1 TABLET BY MOUTH ONCE DAILY IN THE EVENING 01/19/17  Yes Burtis Junes, NP  traMADol (ULTRAM) 50 MG tablet Take 50 mg by mouth 3 (three) times daily. 05/03/17  Yes [provider]  valACYclovir (VALTREX) 500 MG tablet Take 1 tablet by mouth daily.  06/23/15  Yes [provider]  albuterol (VENTOLIN HFA) 108 (90 BASE) MCG/ACT inhaler Inhale 2  puffs into the lungs every 6 (six) hours as needed for wheezing or shortness of breath.     [provider]    Family History Family History  Problem Relation Age of Onset  . Asthma Father   . Diabetes Father   . Heart failure Father   . Cancer Father        Prostate and kidney cancer  . Emphysema Father   . Diabetes Sister   . Cancer Sister   . Stroke Mother 32       Her mother passed away from this stroke at the age of 32  . Cancer Brother        brain cancer & prostate cancer  . Cervical cancer Sister   . Cancer Sister        Recurrent cancer involving  the neck and the jaw    Social History Social History  Substance Use Topics  . Smoking status: Passive Smoke Exposure - Never Smoker  . Smokeless tobacco: Never Used     Comment: Exposure rarely through parents but significant exposure at work  . Alcohol use No     Allergies   Aldactone [spironolactone]; Decadrol [dexamethasone]; Doxycycline; Fluconazole; Sertraline hcl; Hydrocodone; Levofloxacin; Lipitor [atorvastatin]; Losartan; Prednisolone; Sertraline; Ultram [tramadol]; Levofloxacin; Prednisone; Pseudoephedrine; Sertraline hcl; Sudafed [pseudoephedrine hcl]; Trazodone and nefazodone; Adhesive [tape]; Hydrocodone-acetaminophen; Latex; and Quinolones   Review of Systems Review of Systems  Constitutional: Positive for appetite change. Negative for chills, diaphoresis, fatigue and fever.  HENT: Negative for mouth sores, sore throat and trouble swallowing.        Whooshing sound in ears.  Eyes: Negative for visual disturbance.  Respiratory: Negative for cough, chest tightness, shortness of breath and wheezing.   Cardiovascular: Negative for chest pain.  Gastrointestinal: Positive for nausea. Negative for abdominal distention, abdominal pain, diarrhea and vomiting.  Endocrine: Negative for polydipsia, polyphagia and polyuria.  Genitourinary: Negative for dysuria, frequency and hematuria.  Musculoskeletal:  Negative for gait problem.  Skin: Positive for color change. Negative for pallor and rash.  Neurological: Positive for dizziness and weakness. Negative for syncope, light-headedness and headaches.  Hematological: Bruises/bleeds easily.  Psychiatric/Behavioral: Negative for behavioral problems and confusion.     Physical Exam Updated Vital Signs BP (!) 143/75 (BP Location: Left Arm)   Pulse (!) 102   Temp 98.8 F (37.1 C) (Oral)   Resp 16   Ht 5' 1"  (1.549 m)   Wt 59.4 kg (131 lb)   SpO2 97%   BMI 24.75 kg/m   Physical Exam  Constitutional: She is oriented to person, place, and time. She appears well-developed and well-nourished. No distress.  HENT:  Head: Normocephalic.  Conjunctiva are pale  Eyes: Pupils are equal, round, and reactive to light. Conjunctivae are normal. No scleral icterus.  Neck: Normal range of motion. Neck supple. No thyromegaly present.  Cardiovascular: Normal rate and regular rhythm.  Exam reveals no gallop and no friction rub.   No murmur heard. Pulmonary/Chest: Effort normal and breath sounds normal. No respiratory distress. She has no wheezes. She has no rales.  Abdominal: Soft. Bowel sounds are normal. She exhibits no distension. There is no tenderness. There is no rebound.  Marked ecchymosis throughout the majority of her right upper abdomen and right flank  Musculoskeletal: Normal range of motion.  Neurological: She is alert and oriented to person, place, and time.  Skin: Skin is warm and dry. No rash noted.  Psychiatric: She has a normal mood and affect. Her behavior is normal.     ED Treatments / Results  Labs (all labs ordered are listed, but only abnormal results are displayed) Labs Reviewed  CBC WITH DIFFERENTIAL/PLATELET - Abnormal; Notable for the following:       Result Value   WBC 11.0 (*)    RBC 2.23 (*)    Hemoglobin 6.8 (*)    HCT 19.4 (*)    Neutro Abs 7.8 (*)    Monocytes Absolute 1.2 (*)    All other components within  normal limits  COMPREHENSIVE METABOLIC PANEL - Abnormal; Notable for the following:    Sodium 130 (*)    Chloride 95 (*)    Glucose, Bld 105 (*)    Calcium 8.2 (*)    Albumin 3.3 (*)    AST 48 (*)    ALT 128 (*)  All other components within normal limits  PROTIME-INR  TYPE AND SCREEN  ABO/RH  PREPARE RBC (CROSSMATCH)  PREPARE RBC (CROSSMATCH)    EKG  EKG Interpretation None       Radiology Ct Head Wo Contrast  Result Date: 05/21/2017 CLINICAL DATA:  Acute severe throbbing headache EXAM: CT HEAD WITHOUT CONTRAST TECHNIQUE: Contiguous axial images were obtained from the base of the skull through the vertex without intravenous contrast. COMPARISON:  04/10/2012 FINDINGS: Brain: Mild patchy white matter microvascular ischemic changes throughout both cerebral hemispheres. No acute intracranial hemorrhage, mass lesion, definite new infarction, midline shift, herniation, hydrocephalus, or extra-axial fluid collection. Cisterns are patent. No cerebellar abnormality. Vascular: Atherosclerosis of the intracranial vessels at the skullbase. No hyperdense vessel. Skull: Normal. Negative for fracture or focal lesion. Sinuses/Orbits: No acute finding. Other: None. IMPRESSION: Chronic white matter microvascular ischemic changes. No acute intracranial abnormality by noncontrast CT. Electronically Signed   By: Jerilynn Mages.  Shick M.D.   On: 05/21/2017 09:21   Ct Abdomen Pelvis W Contrast  Result Date: 05/21/2017 CLINICAL DATA:  Abdominal pain. History of ocular melanoma, MGUS and thyroid cancer. Reported history of anemia status post laparoscopic liver biopsy 4 days prior at an outside institution. EXAM: CT ABDOMEN AND PELVIS WITH CONTRAST TECHNIQUE: Multidetector CT imaging of the abdomen and pelvis was performed using the standard protocol following bolus administration of intravenous contrast. CONTRAST:  127m ISOVUE-300 IOPAMIDOL (ISOVUE-300) INJECTION 61% COMPARISON:  01/15/2016 CT abdomen/pelvis FINDINGS:  Lower chest: Mild atelectasis in the dependent lung bases bilaterally. Superior approach central venous catheter tip is seen at the cavoatrial junction. Coronary atherosclerosis. Hepatobiliary: There is a subcapsular heterogeneous hypodense 6.0 x 3.5 cm segment 8 right liver dome mass (series 2/image 12) with internal surgical clip and tiny internal focus of gas, which is new since 01/15/2016 and obscures the previously visualized hypervascular 1.1 cm liver mass in this location on the 01/15/2016 CT study. Two subcentimeter hypodense posterior right liver lobe lesions are all stable back to 01/15/2016. No additional liver lesions. There is a new large 11.6 x 7.8 cm irregularly-shaped slightly hyperdense mass in the right infrahepatic space (series 2/ image 38), probably a large hematoma. Normal gallbladder with no radiopaque cholelithiasis. No biliary ductal dilatation. Pancreas: Normal, with no mass or duct dilation. Spleen: Normal size spleen. Subcentimeter hypodense subcapsular peripheral upper splenic lesions are stable back to 04/12/2012 and considered benign. No new splenic lesions. Adrenals/Urinary Tract: Normal adrenals. Normal kidneys with no hydronephrosis and no renal mass. Gas in the nondependent bladder. Otherwise normal bladder. Stomach/Bowel: Grossly normal stomach. Normal caliber small bowel with no small bowel wall thickening. Normal appendix. Mild to moderate sigmoid diverticulosis, with no large bowel wall thickening or significant pericolonic fat stranding. Vascular/Lymphatic: Atherosclerotic nonaneurysmal abdominal aorta. Patent portal, splenic, hepatic and renal veins. No pathologically enlarged lymph nodes in the abdomen or pelvis. Simple cystic 1.8 cm right lower paraesophageal simple structure is stable since 01/15/2016, considered benign. Reproductive: Status post hysterectomy, with no mass at the vaginal cuff. No adnexal mass. Other: No pneumoperitoneum. Moderate volume hemoperitoneum in  the pelvic cul-de-sac. Scattered subcutaneous emphysema and fat stranding in the deep ventral right abdominal wall. No superficial fluid collections. Hernia repair mesh is noted in the ventral abdominal wall. Musculoskeletal: No aggressive appearing focal osseous lesions. Mild thoracolumbar spondylosis. IMPRESSION: 1. New subcapsular 6.0 cm indeterminate segment 8 right liver lobe mass, cannot exclude primary liver malignancy or liver metastasis. This mass contains a biopsy clip and internal gas. Recommend correlation with biopsy results.  2. New large 11.6 x 7.8 cm hematoma in the right infrahepatic space. New moderate volume hemoperitoneum in layering in the pelvic cul-de-sac . 3. No additional potential findings of metastatic disease in the abdomen or pelvis. 4. Postsurgical changes in the ventral right abdominal wall. No superficial fluid collections. 5.  Aortic Atherosclerosis (ICD10-I70.0).  Coronary atherosclerosis. Electronically Signed   By: Ilona Sorrel M.D.   On: 05/21/2017 11:18    Procedures Procedures (including critical care time)  Medications Ordered in ED Medications  iopamidol (ISOVUE-300) 61 % injection (not administered)  0.9 %  sodium chloride infusion (not administered)  0.9 %  sodium chloride infusion (not administered)  0.9 %  sodium chloride infusion (not administered)  0.9 %  sodium chloride infusion (not administered)  iopamidol (ISOVUE-300) 61 % injection 100 mL (100 mLs Intravenous Contrast Given 05/21/17 1052)     Initial Impression / Assessment and Plan / ED Course  I have reviewed the triage vital signs and the nursing notes.  Pertinent labs & imaging results that were available during my care of the patient were reviewed by me and considered in my medical decision making (see chart for details).   preop hemoglobin was 12.8. Hemoglobin today 6.8. CT scan shows post surgical changes and subcapsular blood. Also shows organized hepatic hematoma. No diffuse free  fluid/hemoperitoneum.  I discussed risks and benefits of transfusion with her. She has remained hemodynamically stable. She is not tachycardic or hypotensive but did have orthostasis this morning. Dr. Nino Glow Is the patient's general surgeon at [facility]. I discussed with his on call coverage Dr. Curly Sh[place].  Dr. Carlynn Spry was quite helpful and returned my call immediately. With thorough discussion regarding the patient. He felt most comfortable with the patient in transfer back to their care at [facility]. I think the patient is stable for this transfer. My suspicion is that this is not ongoing bleeding. There was no blush or obvious extravasation to suggest ongoing hemorrhage in the blood was described as hematoma on CT scan. She underwent type and screen is being given 2 units of packed red blood cell. We await transfer.  CRITICAL CARE Performed by: Tanna Furry JOSEPH   Total critical care time: 30 minutes  Critical care time was exclusive of separately billable procedures and treating other patients.  Critical care was necessary to treat or prevent imminent or life-threatening deterioration.  Critical care was time spent personally by me on the following activities: development of treatment plan with patient and/or surrogate as well as nursing, discussions with consultants, evaluation of patient's response to treatment, examination of patient, obtaining history from patient or surrogate, ordering and performing treatments and interventions, ordering and review of laboratory studies, ordering and review of radiographic studies, pulse oximetry and re-evaluation of patient's condition.   Final Clinical Impressions(s) / ED Diagnoses   Final diagnoses:  None    New Prescriptions New Prescriptions   No medications on file     Tanna Furry, MD 05/21/17 1446

## 2017-05-21 NOTE — ED Notes (Signed)
Despite all of my efforts to d/c this pt., I am unable d/t "address running IV's" message.

## 2017-05-21 NOTE — ED Notes (Signed)
[facility] unable to arrange transport for pt until this evening. Called [facility] and arranged transport, but will be a few hours until they arrive. Primary RN made aware

## 2017-05-25 LAB — TYPE AND SCREEN
ABO/RH(D): O POS
ANTIBODY SCREEN: NEGATIVE
Unit division: 0
Unit division: 0

## 2017-05-25 LAB — BPAM RBC
Blood Product Expiration Date: 201809172359
Blood Product Expiration Date: 201809172359
ISSUE DATE / TIME: 201808181503
Unit Type and Rh: 5100
Unit Type and Rh: 5100

## 2017-05-27 ENCOUNTER — Ambulatory Visit: Payer: Medicare Other | Admitting: Pulmonary Disease
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
## 2017-05-31 DIAGNOSIS — C9000 Multiple myeloma not having achieved remission: Secondary | ICD-10-CM | POA: Diagnosis not present

## 2017-05-31 DIAGNOSIS — J45909 Unspecified asthma, uncomplicated: Secondary | ICD-10-CM | POA: Diagnosis not present

## 2017-05-31 DIAGNOSIS — Z885 Allergy status to narcotic agent status: Secondary | ICD-10-CM | POA: Diagnosis not present

## 2017-05-31 DIAGNOSIS — Z79899 Other long term (current) drug therapy: Secondary | ICD-10-CM | POA: Diagnosis not present

## 2017-05-31 DIAGNOSIS — E039 Hypothyroidism, unspecified: Secondary | ICD-10-CM | POA: Diagnosis not present

## 2017-05-31 DIAGNOSIS — I119 Hypertensive heart disease without heart failure: Secondary | ICD-10-CM | POA: Diagnosis not present

## 2017-05-31 DIAGNOSIS — E7800 Pure hypercholesterolemia, unspecified: Secondary | ICD-10-CM | POA: Diagnosis not present

## 2017-05-31 DIAGNOSIS — K219 Gastro-esophageal reflux disease without esophagitis: Secondary | ICD-10-CM | POA: Diagnosis not present

## 2017-05-31 DIAGNOSIS — L0292 Furuncle, unspecified: Secondary | ICD-10-CM | POA: Diagnosis not present

## 2017-06-07 DIAGNOSIS — N182 Chronic kidney disease, stage 2 (mild): Secondary | ICD-10-CM | POA: Diagnosis not present

## 2017-06-07 DIAGNOSIS — C73 Malignant neoplasm of thyroid gland: Secondary | ICD-10-CM | POA: Diagnosis not present

## 2017-06-07 DIAGNOSIS — C9000 Multiple myeloma not having achieved remission: Secondary | ICD-10-CM | POA: Diagnosis not present

## 2017-06-07 DIAGNOSIS — E669 Obesity, unspecified: Secondary | ICD-10-CM | POA: Diagnosis not present

## 2017-06-07 DIAGNOSIS — R319 Hematuria, unspecified: Secondary | ICD-10-CM | POA: Diagnosis not present

## 2017-06-07 DIAGNOSIS — D369 Benign neoplasm, unspecified site: Secondary | ICD-10-CM | POA: Diagnosis not present

## 2017-06-07 DIAGNOSIS — I129 Hypertensive chronic kidney disease with stage 1 through stage 4 chronic kidney disease, or unspecified chronic kidney disease: Secondary | ICD-10-CM | POA: Diagnosis not present

## 2017-06-07 DIAGNOSIS — E785 Hyperlipidemia, unspecified: Secondary | ICD-10-CM | POA: Diagnosis not present

## 2017-06-07 DIAGNOSIS — I639 Cerebral infarction, unspecified: Secondary | ICD-10-CM | POA: Diagnosis not present

## 2017-06-07 DIAGNOSIS — E039 Hypothyroidism, unspecified: Secondary | ICD-10-CM | POA: Diagnosis not present

## 2017-06-07 DIAGNOSIS — C6990 Malignant neoplasm of unspecified site of unspecified eye: Secondary | ICD-10-CM | POA: Diagnosis not present

## 2017-06-07 DIAGNOSIS — I8393 Asymptomatic varicose veins of bilateral lower extremities: Secondary | ICD-10-CM | POA: Diagnosis not present

## 2017-06-14 DIAGNOSIS — Z5112 Encounter for antineoplastic immunotherapy: Secondary | ICD-10-CM | POA: Diagnosis not present

## 2017-06-14 DIAGNOSIS — C9000 Multiple myeloma not having achieved remission: Secondary | ICD-10-CM | POA: Diagnosis not present

## 2017-06-16 DIAGNOSIS — Z23 Encounter for immunization: Secondary | ICD-10-CM | POA: Diagnosis not present

## 2017-06-23 DIAGNOSIS — R6889 Other general symptoms and signs: Secondary | ICD-10-CM | POA: Diagnosis not present

## 2017-06-23 DIAGNOSIS — C9000 Multiple myeloma not having achieved remission: Secondary | ICD-10-CM | POA: Diagnosis not present

## 2017-06-23 DIAGNOSIS — D472 Monoclonal gammopathy: Secondary | ICD-10-CM | POA: Diagnosis not present

## 2017-06-28 DIAGNOSIS — Z5111 Encounter for antineoplastic chemotherapy: Secondary | ICD-10-CM | POA: Diagnosis not present

## 2017-06-28 DIAGNOSIS — C9000 Multiple myeloma not having achieved remission: Secondary | ICD-10-CM | POA: Diagnosis not present

## 2017-06-28 DIAGNOSIS — F411 Generalized anxiety disorder: Secondary | ICD-10-CM | POA: Diagnosis not present

## 2017-06-29 ENCOUNTER — Telehealth: Payer: Self-pay | Admitting: *Deleted

## 2017-06-29 NOTE — Telephone Encounter (Signed)
VM retrieved post 5 pm per transfer from [facility] - message is from Dr Arita Miss dental office stating pt is present for a dental cleaning and they are inquiring if she is appropriate and needs to be on an antibiotic. " the patient says she is between treatments "  Return call number given as [phone].  Per chart review - pt is not actively seen at this office. She was seen most recently in the ER 05/21/2017 and was transferred to [facility] under the care of Dr Carlynn Spry.  This note will be faxed to Dr Ronnald Ramp office for communication purposes as well as the attending RN will not be available tomorrow for phone communication.

## 2017-07-12 ENCOUNTER — Ambulatory Visit (INDEPENDENT_AMBULATORY_CARE_PROVIDER_SITE_OTHER): Payer: Medicare Other | Admitting: Podiatry

## 2017-07-12 DIAGNOSIS — M79676 Pain in unspecified toe(s): Secondary | ICD-10-CM | POA: Diagnosis not present

## 2017-07-12 DIAGNOSIS — Z5111 Encounter for antineoplastic chemotherapy: Secondary | ICD-10-CM | POA: Diagnosis not present

## 2017-07-12 DIAGNOSIS — B351 Tinea unguium: Secondary | ICD-10-CM | POA: Diagnosis not present

## 2017-07-12 DIAGNOSIS — L600 Ingrowing nail: Secondary | ICD-10-CM

## 2017-07-12 DIAGNOSIS — M79609 Pain in unspecified limb: Secondary | ICD-10-CM

## 2017-07-12 DIAGNOSIS — C9000 Multiple myeloma not having achieved remission: Secondary | ICD-10-CM | POA: Diagnosis not present

## 2017-07-12 NOTE — Patient Instructions (Signed)
Epsom Salt Soak Instructions     Place 1/4 cup of epsom salts in a quart of warm tap water.  Soak your foot or feet in the solution for 20 minutes twice a day as needed for pain relief IF YOUR SKIN BECOMES IRRITATED WHILE USING THESE INSTRUCTIONS, IT IS OKAY TO SWITCH TO  WHITE VINEGAR AND WATER. Or you may use antibacterial soap and water to keep the toe clean  Monitor for any signs/symptoms of infection. Call the office immediately if any occur or go directly to the emergency room. Call with any questions/concerns.

## 2017-07-13 NOTE — Progress Notes (Signed)
She presents today to see Janett Billow for nail trimming. States her nails are long painful.  Objective: Vital signs are stable alert and oriented 3 pulses remain palpable hallux valgus and hammertoes orders are noted bilaterally. Toenails are sharply incurvated painful on palpation and debridement.  Assessment: Pain limb secondary to onychomycosis.  Plan: Follow up with Janett Billow for nail debridement in 2 months

## 2017-07-14 ENCOUNTER — Other Ambulatory Visit: Payer: Medicare Other

## 2017-07-21 DIAGNOSIS — C9000 Multiple myeloma not having achieved remission: Secondary | ICD-10-CM | POA: Diagnosis not present

## 2017-07-26 DIAGNOSIS — Z888 Allergy status to other drugs, medicaments and biological substances status: Secondary | ICD-10-CM | POA: Diagnosis not present

## 2017-07-26 DIAGNOSIS — Z5112 Encounter for antineoplastic immunotherapy: Secondary | ICD-10-CM | POA: Diagnosis not present

## 2017-07-26 DIAGNOSIS — Z85840 Personal history of malignant neoplasm of eye: Secondary | ICD-10-CM | POA: Diagnosis not present

## 2017-07-26 DIAGNOSIS — C9000 Multiple myeloma not having achieved remission: Secondary | ICD-10-CM | POA: Diagnosis not present

## 2017-07-27 ENCOUNTER — Emergency Department (HOSPITAL_COMMUNITY)
Admission: EM | Admit: 2017-07-27 | Discharge: 2017-07-27 | Disposition: A | Discharge status: Home / Self Care | Payer: Medicare Other | Attending: Emergency Medicine | Admitting: Emergency Medicine

## 2017-07-27 ENCOUNTER — Encounter (HOSPITAL_COMMUNITY): Payer: Self-pay | Admitting: Emergency Medicine

## 2017-07-27 ENCOUNTER — Emergency Department (HOSPITAL_COMMUNITY): Payer: Medicare Other

## 2017-07-27 DIAGNOSIS — Z85850 Personal history of malignant neoplasm of thyroid: Secondary | ICD-10-CM | POA: Diagnosis not present

## 2017-07-27 DIAGNOSIS — Z91040 Latex allergy status: Secondary | ICD-10-CM | POA: Insufficient documentation

## 2017-07-27 DIAGNOSIS — J45909 Unspecified asthma, uncomplicated: Secondary | ICD-10-CM | POA: Insufficient documentation

## 2017-07-27 DIAGNOSIS — N189 Chronic kidney disease, unspecified: Secondary | ICD-10-CM | POA: Diagnosis not present

## 2017-07-27 DIAGNOSIS — Z7982 Long term (current) use of aspirin: Secondary | ICD-10-CM | POA: Diagnosis not present

## 2017-07-27 DIAGNOSIS — R51 Headache: Secondary | ICD-10-CM | POA: Insufficient documentation

## 2017-07-27 DIAGNOSIS — Z85820 Personal history of malignant melanoma of skin: Secondary | ICD-10-CM | POA: Diagnosis not present

## 2017-07-27 DIAGNOSIS — Z79899 Other long term (current) drug therapy: Secondary | ICD-10-CM | POA: Diagnosis not present

## 2017-07-27 DIAGNOSIS — I129 Hypertensive chronic kidney disease with stage 1 through stage 4 chronic kidney disease, or unspecified chronic kidney disease: Secondary | ICD-10-CM | POA: Insufficient documentation

## 2017-07-27 DIAGNOSIS — Z85828 Personal history of other malignant neoplasm of skin: Secondary | ICD-10-CM | POA: Diagnosis not present

## 2017-07-27 DIAGNOSIS — Z7722 Contact with and (suspected) exposure to environmental tobacco smoke (acute) (chronic): Secondary | ICD-10-CM | POA: Insufficient documentation

## 2017-07-27 DIAGNOSIS — E039 Hypothyroidism, unspecified: Secondary | ICD-10-CM | POA: Diagnosis not present

## 2017-07-27 DIAGNOSIS — H9203 Otalgia, bilateral: Secondary | ICD-10-CM | POA: Diagnosis present

## 2017-07-27 DIAGNOSIS — H9313 Tinnitus, bilateral: Secondary | ICD-10-CM

## 2017-07-27 LAB — CBC WITH DIFFERENTIAL/PLATELET
Basophils Absolute: 0 K/uL (ref 0.0–0.1)
Basophils Relative: 0 %
Eosinophils Absolute: 0 K/uL (ref 0.0–0.7)
Eosinophils Relative: 0 %
HCT: 35.3 % — ABNORMAL LOW (ref 36.0–46.0)
Hemoglobin: 11.6 g/dL — ABNORMAL LOW (ref 12.0–15.0)
Lymphocytes Relative: 4 %
Lymphs Abs: 0.7 K/uL (ref 0.7–4.0)
MCH: 29.5 pg (ref 26.0–34.0)
MCHC: 32.9 g/dL (ref 30.0–36.0)
MCV: 89.8 fL (ref 78.0–100.0)
Monocytes Absolute: 0.7 K/uL (ref 0.1–1.0)
Monocytes Relative: 4 %
Neutro Abs: 18 K/uL — ABNORMAL HIGH (ref 1.7–7.7)
Neutrophils Relative %: 92 %
Platelets: 189 K/uL (ref 150–400)
RBC: 3.93 MIL/uL (ref 3.87–5.11)
RDW: 13.3 % (ref 11.5–15.5)
WBC: 19.4 K/uL — ABNORMAL HIGH (ref 4.0–10.5)

## 2017-07-27 LAB — COMPREHENSIVE METABOLIC PANEL WITH GFR
ALT: 21 U/L (ref 14–54)
AST: 24 U/L (ref 15–41)
Albumin: 3.8 g/dL (ref 3.5–5.0)
Alkaline Phosphatase: 37 U/L — ABNORMAL LOW (ref 38–126)
Anion gap: 11 (ref 5–15)
BUN: 17 mg/dL (ref 6–20)
CO2: 25 mmol/L (ref 22–32)
Calcium: 9.8 mg/dL (ref 8.9–10.3)
Chloride: 104 mmol/L (ref 101–111)
Creatinine, Ser: 0.59 mg/dL (ref 0.44–1.00)
GFR calc Af Amer: >60 mL/min
GFR calc non Af Amer: >60 mL/min
Glucose, Bld: 122 mg/dL — ABNORMAL HIGH (ref 65–99)
Potassium: 3.5 mmol/L (ref 3.5–5.1)
Sodium: 140 mmol/L (ref 135–145)
Total Bilirubin: 0.4 mg/dL (ref 0.3–1.2)
Total Protein: 7.9 g/dL (ref 6.5–8.1)

## 2017-07-27 MED ORDER — HEPARIN SOD (PORK) LOCK FLUSH 100 UNIT/ML IV SOLN
500.0000 [IU] | Freq: Once | INTRAVENOUS | Status: AC
  Administered 2017-07-27: 500 [IU]
  Filled 2017-07-27: qty 5

## 2017-07-27 NOTE — ED Provider Notes (Signed)
[facility] DEPT Provider Note   CSN: [id number] Arrival date & time: 07/27/17  1120     History   Chief Complaint Chief Complaint  Patient presents with  . Otalgia    HPI [name] is a 76 y.o. 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  Patient reports the symptoms are getting progressively worse she has associated headache when she experiences the symptoms.  Patient denies any neck pain, fevers, chills, nausea, vomiting, change in her vision.  Patient reports that she is experiencing symptoms once before over the summer following abdominal surgery with partial liver resection.  She states at that time she was told that she had very low blood counts and wound transferred to [facility] for further care.  Patient denies any change in her bowel habits, melena, hematochezia, abnormal bleeding or bruising.   Per oncology notes, pt currently on Velcade. IN February 2018 pt was found to have a "liver lesion" on CT. She had liver resction 05/2017 at [facility], pathology was benign. Pt was seen at heme-onc yesterday by Dr. Baird Cancer in order to start new medication daratumumab as it was felt that she was progressing. Pt states that she has not yet started this medication.  HPI  Past Medical History:  Diagnosis Date  . Anxiety   . Arthritis   . Asthma   . Cancer (HCC)    THYROID, SKIN, NOSE, BONE  . Chronic kidney disease   . Diverticulosis    Pt reported on 03/15/12  . Eye problems    LOST VISION RIGHT EYE  . Hernia   . History of blood clots    LEG  . Hyperlipidemia   . Hypertension   . Melanoma ([place])   . Melanoma ([place]) 1989   chest  . MGUS (monoclonal gammopathy of unknown significance)   . MGUS (monoclonal gammopathy of unknown significance)   . Mitral valve prolapse     . Perforated bowel ([place])   . Peritonitis ([place])   . Stroke ([place])   . Thyroid disease     Patient Active Problem List   Diagnosis Date Noted  . Periorbital edema 01/22/2015  . Allergic rhinitis 08/12/2014  . Anxiety 07/04/2014  . 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[place]) 07/04/2014  . Multiple myeloma without remission ([place]) 11/01/2013  . Cerebral vascular accident ([place]) 02/19/2013  . Bloodgood disease 02/19/2013  . Acid reflux 02/19/2013  . Blood in the urine 02/19/2013  . Billowing mitral valve 02/19/2013  . Adenomatous colon polyp 02/19/2013  . Smoldering multiple myeloma ([place]) 02/16/2013  . Cataract, nuclear 12/16/2011  . Intragel vitreous hemorrhage ([place]) 12/16/2011  . Age-related macular degeneration, dry 12/02/2011  . Post-radiation retinopathy 12/02/2011  . Pseudophakia 12/02/2011  . MGUS (monoclonal gammopathy of unknown significance) 11/26/2011  . 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 Extrinsic asthma 09/21/2011  . Colon, diverticulosis 07/26/2011  . Benign hypertensive heart disease without heart failure 03/31/2011  . Hypercholesterolemia 03/31/2011  . Hypothyroidism 03/31/2011  . History of melanoma 03/31/2011  . Hx of thyroid cancer 03/31/2011  . H/O malignant melanoma of skin 03/31/2011    Past Surgical History:  Procedure Laterality Date  . ABDOMINAL HYSTERECTOMY    . BREAST LUMPECTOMY  12/2010   R breast  . BREAST LUMPECTOMY  2004   L axilla neg for  cancer.  [name] CARDIOVASCULAR STRESS TEST  2006   NORMAL  . HERNIA REPAIR    . LEG SURGERY     VEIN & BLOOD CLOT  . MELANOMA EXCISION     R eye  . ROTATOR CUFF REPAIR  2004   RIGHT SHOULDER  . SKIN CANCER EXCISION  2011, 2013   squamous cell of nose x 2  . TRANSTHORACIC ECHOCARDIOGRAM  2006    OB History    No data available       Home Medications    Prior to Admission medications   Medication Sig Start Date End Date Taking? Authorizing Provider  acetaminophen (TYLENOL) 500 MG tablet Take  250-500 mg by mouth every 6 (six) hours as needed for mild pain, moderate pain, fever or headache.    Yes [provider]  albuterol (VENTOLIN HFA) 108 (90 BASE) MCG/ACT inhaler Inhale 2 puffs into the lungs every 6 (six) hours as needed for wheezing or shortness of breath.    Yes [provider]  ALPRAZolam Duanne Moron) 0.5 MG tablet Take 0.5 mg by mouth 2 (two) times daily as needed for anxiety or sleep.  07/26/11  Yes [provider]  amLODipine (NORVASC) 5 MG tablet Take 1 tablet (5 mg total) by mouth daily. 01/05/17  Yes Burtis Junes, NP  aspirin 81 MG chewable tablet Chew 81 mg by mouth at bedtime.  07/26/11  Yes [provider]  azelastine (ASTELIN) 0.1 % nasal spray Place 1 spray into both nostrils daily as needed for allergies.  05/08/17  Yes [provider]  carboxymethylcellulose (REFRESH TEARS) 0.5 % SOLN Place 1 drop into both eyes 3 (three) times daily as needed (for allergies).   Yes [provider]  clonazePAM (KLONOPIN) 0.5 MG tablet Take 0.25-0.5 mg by mouth at bedtime as needed for anxiety.  05/06/12  Yes [provider]  fexofenadine (ALLEGRA) 180 MG tablet Take 180 mg by mouth daily.    Yes [provider]  fluticasone (FLONASE) 50 MCG/ACT nasal spray Place 1 spray into both nostrils 2 (two) times daily. 07/09/15  Yes Javier Glazier, MD  levothyroxine (SYNTHROID, LEVOTHROID) 112 MCG tablet Take 112 mcg by mouth See admin instructions. Take 1 tablet by mouth daily on weekends only. 11/13/15  Yes [provider]  levothyroxine (SYNTHROID, LEVOTHROID) 125 MCG tablet Take 125 mcg by mouth daily. Take 1 tablet daily during the week only. 05/13/17  Yes [provider]  loperamide (IMODIUM A-D) 2 MG tablet Take 2-4 mg by mouth 4 (four) times daily as needed for diarrhea or loose stools.   Yes [provider]  MICARDIS 40 MG tablet Take 1 tablet (40 mg total) by mouth daily. 01/05/17  Yes Burtis Junes, NP  Multiple Vitamins-Minerals (PRESERVISION AREDS PO) Take 1 capsule by mouth 2 (two) times a week.    Yes [provider]  nystatin ointment (MYCOSTATIN) Apply 1 application topically 2 (two) times daily. As directed 09/12/16  Yes Lacretia Leigh, MD  Olopatadine HCl 0.7 % SOLN Place 1 drop into both eyes daily as needed (for allergies). Reported on 12/02/2015   Yes [provider]  ondansetron (ZOFRAN) 4 MG tablet Take 4 mg by mouth every 8 (eight) hours as needed for nausea or vomiting.  05/19/17  Yes [provider]  Polyethyl Glycol-Propyl Glycol (SYSTANE) 0.4-0.3 % SOLN Place 1 drop into both eyes 2 (two) times daily as needed (for allergies).   Yes [provider]  simvastatin (  ZOCOR) 20 MG tablet TAKE 1 TABLET BY MOUTH ONCE DAILY IN THE EVENING 01/19/17  Yes Burtis Junes, NP  traMADol (ULTRAM) 50 MG tablet Take 50 mg by mouth every 6 (six) hours as needed for moderate pain.  05/03/17  Yes [provider]  valACYclovir (VALTREX) 500 MG tablet Take 1 tablet by mouth daily.  06/23/15  Yes [provider]    Family History Family History  Problem Relation Age of Onset  . Asthma Father   . Diabetes Father   . Heart failure Father   . Cancer Father        Prostate and kidney cancer  . Emphysema Father   . Diabetes Sister   . Cancer Sister   . Stroke Mother 34       Her mother passed away from this stroke at the age of 77  . Cancer Brother        brain cancer & prostate cancer  . Cervical cancer Sister   . Cancer Sister        Recurrent cancer involving the neck and the jaw    Social History Social History  Substance Use Topics  . Smoking status: Passive Smoke Exposure - Never Smoker  . Smokeless tobacco: Never Used     Comment: Exposure rarely through parents but significant exposure at work  . Alcohol use No     Allergies   Aldactone [spironolactone]; Decadrol [dexamethasone]; Doxycycline; Fluconazole;  Sertraline hcl; Hydrocodone; Levofloxacin; Lipitor [atorvastatin]; Losartan; Prednisolone; Sertraline; Ultram [tramadol]; Levofloxacin; Prednisone; Pseudoephedrine; Sertraline hcl; Sudafed [pseudoephedrine hcl]; Trazodone and nefazodone; Adhesive [tape]; Hydrocodone-acetaminophen; Latex; and Quinolones   Review of Systems Review of Systems  All other systems reviewed and are negative.    Physical Exam Updated Vital Signs BP (!) 172/86   Pulse 69   Temp 97.8 F (36.6 C) (Oral)   Resp 15   SpO2 98%   Physical Exam  Constitutional: She is oriented to person, place, and time. She appears well-developed and well-nourished. No distress.  HENT:  Head: Normocephalic and atraumatic.  Mouth/Throat: No oropharyngeal exudate.  Eyes: Pupils are equal, round, and reactive to light. Conjunctivae and EOM are normal. Right eye exhibits no discharge. Left eye exhibits no discharge. No scleral icterus.  Cardiovascular: Normal rate, regular rhythm, normal heart sounds and intact distal pulses.  Exam reveals no gallop and no friction rub.   No murmur heard. Pulmonary/Chest: Effort normal and breath sounds normal. No respiratory distress. She has no wheezes. She has no rales. She exhibits no tenderness.  Abdominal: Soft. She exhibits no distension. There is no tenderness. There is no guarding.  Musculoskeletal: Normal range of motion. She exhibits no edema.  Neurological: She is alert and oriented to person, place, and time. No cranial nerve deficit.  Strength 5/5 throughout. No sensory deficits. No gait abnormality. No dysmetria. No slurred speech. No facial droop. Negative pronator drift.    Skin: Skin is warm and dry. No rash noted. She is not diaphoretic. No erythema. No pallor.  Dry skin notes over dorsum of b/l hands  Psychiatric: She has a normal mood and affect. Her behavior is normal.  Nursing note and vitals reviewed.    ED Treatments / Results  Labs (all labs ordered are listed, but  only abnormal results are displayed) Labs Reviewed  CBC WITH DIFFERENTIAL/PLATELET - Abnormal; Notable for the following:       Result Value   WBC 19.4 (*)    Hemoglobin 11.6 (*)  HCT 35.3 (*)    Neutro Abs 18.0 (*)    All other components within normal limits  COMPREHENSIVE METABOLIC PANEL - Abnormal; Notable for the following:    Glucose, Bld 122 (*)    Alkaline Phosphatase 37 (*)    All other components within normal limits    EKG  EKG Interpretation None       Radiology Ct Head Wo Contrast  Result Date: 07/27/2017 CLINICAL DATA:  76 year old female with acute headache for 2 days. History of multiple myeloma, thyroid cancer and melanoma. EXAM: CT HEAD WITHOUT CONTRAST TECHNIQUE: Contiguous axial images were obtained from the base of the skull through the vertex without intravenous contrast. COMPARISON:  05/21/2017 and prior CTs FINDINGS: Brain: No evidence of acute infarction, hemorrhage, hydrocephalus, extra-axial collection or mass lesion/mass effect. Atrophy and chronic small-vessel white matter ischemic changes are again noted. Vascular: Intracranial atherosclerotic calcifications noted. Skull: Normal. Negative for fracture or focal lesion. Sinuses/Orbits: No acute finding. Other: None. IMPRESSION: 1. No evidence of acute intracranial abnormality 2. Atrophy and chronic small-vessel white matter ischemic changes. Electronically Signed   By: Margarette Canada M.D.   On: 07/27/2017 16:05    Procedures Procedures (including critical care time)  Medications Ordered in ED Medications - No data to display   Initial Impression / Assessment and Plan / ED Course  I have reviewed the triage vital signs and the nursing notes.  Pertinent labs & imaging results that were available during my care of the patient were reviewed by me and considered in my medical decision making (see chart for details).     76 year old female with history of smoldering multiple myeloma, followed by  hemolytic Dr. Baird Cancer at [facility] presented to the ED today complaining of 2 weeks of throbbing/ringing in her ears.  On exam she has no neurological deficits.  Vitals are stable.  No abnormal bleeding or bruising.  Patient reports she had had symptoms like this previously but it had resolved.  At this point would like to get CT head to evaluate for possible metastatic cancer.  She also reports that she had similar symptoms when she had postoperative bleeding over the summer.  We will check CBC as well.  Hemoglobin stable at 11.6.  CT head without any acute abnormality.  It does show atrophy and chronic small vessel white matter ischemic changes.  She has been having memory difficulties lately suspect this is side effect of ongoing chemotherapy.  She is scheduled to start new medication per him off notes from yesterday.  Feel the patient is safe for discharge with close oncology follow up. Return precautions outlined in patient discharge instructions.   Patient was discussed with and seen by Dr. Dr. Venora Maples who agrees with the treatment plan.    Final Clinical Impressions(s) / ED Diagnoses   Final diagnoses:  None    New Prescriptions New Prescriptions   No medications on file     Wynelle Fanny 07/27/17 [place], [place] 07/28/17 [phone]

## 2017-07-27 NOTE — ED Triage Notes (Signed)
Pt complaint of pain and "beating in the ears" onset 2 days ago; "last night was the worst." Pt verbalizes currently receives chemotherapy.

## 2017-07-27 NOTE — ED Notes (Signed)
Pt's port deaccessed and heparin flushed

## 2017-07-27 NOTE — Discharge Instructions (Signed)
Your CT and labs today were very reassuring. Please follow up as soon as possible with your oncology provider for re-evaluation. Return to the ED if you experience severe worsening of your symptoms, change in vision, loss of consciousness, difficulty walking.

## 2017-07-27 NOTE — ED Notes (Signed)
ED Provider at bedside. 

## 2017-07-27 NOTE — ED Notes (Signed)
Pt's right chest port accessed

## 2017-07-28 DIAGNOSIS — L301 Dyshidrosis [pompholyx]: Secondary | ICD-10-CM | POA: Diagnosis not present

## 2017-07-28 DIAGNOSIS — R413 Other amnesia: Secondary | ICD-10-CM | POA: Diagnosis not present

## 2017-07-28 DIAGNOSIS — F439 Reaction to severe stress, unspecified: Secondary | ICD-10-CM | POA: Diagnosis not present

## 2017-08-03 DIAGNOSIS — C9000 Multiple myeloma not having achieved remission: Secondary | ICD-10-CM | POA: Diagnosis not present

## 2017-08-10 DIAGNOSIS — C9000 Multiple myeloma not having achieved remission: Secondary | ICD-10-CM | POA: Diagnosis not present

## 2017-08-11 DIAGNOSIS — H5712 Ocular pain, left eye: Secondary | ICD-10-CM | POA: Diagnosis not present

## 2017-08-15 DIAGNOSIS — R413 Other amnesia: Secondary | ICD-10-CM | POA: Diagnosis not present

## 2017-08-17 DIAGNOSIS — C9000 Multiple myeloma not having achieved remission: Secondary | ICD-10-CM | POA: Diagnosis not present

## 2017-08-17 DIAGNOSIS — Z5112 Encounter for antineoplastic immunotherapy: Secondary | ICD-10-CM | POA: Diagnosis not present

## 2017-08-24 DIAGNOSIS — Z85850 Personal history of malignant neoplasm of thyroid: Secondary | ICD-10-CM | POA: Diagnosis not present

## 2017-08-24 DIAGNOSIS — C9000 Multiple myeloma not having achieved remission: Secondary | ICD-10-CM | POA: Diagnosis not present

## 2017-08-24 DIAGNOSIS — Z85820 Personal history of malignant melanoma of skin: Secondary | ICD-10-CM | POA: Diagnosis not present

## 2017-08-31 DIAGNOSIS — C9000 Multiple myeloma not having achieved remission: Secondary | ICD-10-CM | POA: Diagnosis not present

## 2017-08-31 DIAGNOSIS — Z5111 Encounter for antineoplastic chemotherapy: Secondary | ICD-10-CM | POA: Diagnosis not present

## 2017-09-02 ENCOUNTER — Ambulatory Visit: Payer: Medicare Other | Admitting: Pulmonary Disease
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
## 2017-09-05 ENCOUNTER — Ambulatory Visit: Payer: Medicare Other

## 2017-09-07 DIAGNOSIS — C9000 Multiple myeloma not having achieved remission: Secondary | ICD-10-CM | POA: Diagnosis not present

## 2017-09-09 DIAGNOSIS — Z6825 Body mass index (BMI) 25.0-25.9, adult: Secondary | ICD-10-CM | POA: Diagnosis not present

## 2017-09-09 DIAGNOSIS — Z01419 Encounter for gynecological examination (general) (routine) without abnormal findings: Secondary | ICD-10-CM | POA: Diagnosis not present

## 2017-09-09 DIAGNOSIS — Z1272 Encounter for screening for malignant neoplasm of vagina: Secondary | ICD-10-CM | POA: Diagnosis not present

## 2017-09-09 DIAGNOSIS — Z779 Other contact with and (suspected) exposures hazardous to health: Secondary | ICD-10-CM | POA: Diagnosis not present

## 2017-09-12 ENCOUNTER — Ambulatory Visit: Payer: Medicare Other

## 2017-09-14 DIAGNOSIS — C9000 Multiple myeloma not having achieved remission: Secondary | ICD-10-CM | POA: Diagnosis not present

## 2017-09-14 DIAGNOSIS — Z5112 Encounter for antineoplastic immunotherapy: Secondary | ICD-10-CM | POA: Diagnosis not present

## 2017-09-15 ENCOUNTER — Other Ambulatory Visit: Payer: Medicare Other

## 2017-09-19 ENCOUNTER — Ambulatory Visit: Payer: Medicare Other

## 2017-09-20 DIAGNOSIS — C9000 Multiple myeloma not having achieved remission: Secondary | ICD-10-CM | POA: Diagnosis not present

## 2017-09-20 DIAGNOSIS — Z79899 Other long term (current) drug therapy: Secondary | ICD-10-CM | POA: Diagnosis not present

## 2017-09-22 DIAGNOSIS — H01002 Unspecified blepharitis right lower eyelid: Secondary | ICD-10-CM | POA: Diagnosis not present

## 2017-09-22 DIAGNOSIS — H04123 Dry eye syndrome of bilateral lacrimal glands: Secondary | ICD-10-CM | POA: Diagnosis not present

## 2017-09-22 DIAGNOSIS — H01001 Unspecified blepharitis right upper eyelid: Secondary | ICD-10-CM | POA: Diagnosis not present

## 2017-09-22 DIAGNOSIS — H01004 Unspecified blepharitis left upper eyelid: Secondary | ICD-10-CM | POA: Diagnosis not present

## 2017-09-22 DIAGNOSIS — H524 Presbyopia: Secondary | ICD-10-CM | POA: Diagnosis not present

## 2017-09-23 ENCOUNTER — Ambulatory Visit (INDEPENDENT_AMBULATORY_CARE_PROVIDER_SITE_OTHER): Payer: Medicare Other | Admitting: Podiatry

## 2017-09-23 DIAGNOSIS — B351 Tinea unguium: Secondary | ICD-10-CM

## 2017-09-23 DIAGNOSIS — M79609 Pain in unspecified limb: Secondary | ICD-10-CM

## 2017-09-26 ENCOUNTER — Other Ambulatory Visit: Payer: Self-pay | Admitting: Nurse Practitioner

## 2017-09-28 DIAGNOSIS — C9000 Multiple myeloma not having achieved remission: Secondary | ICD-10-CM | POA: Diagnosis not present

## 2017-09-28 DIAGNOSIS — Z5111 Encounter for antineoplastic chemotherapy: Secondary | ICD-10-CM | POA: Diagnosis not present

## 2017-10-01 NOTE — Progress Notes (Signed)
  Subjective:  Patient ID: [name], female    DOB: Jun 03, 1941,  MRN: [id number]  76 y.o. female returns for the above complaint. Reports painful thickened elongated nails to both feet.  Unable to care for the nails herself.  Objective:  There were no vitals filed for this visit. General AA&O x3. Normal mood and affect.  Vascular Pedal pulses palpable.  Neurologic Epicritic sensation grossly intact.  Dermatologic Nails x10 with elongation thickening and pain to palpation  Orthopedic: No pain to palpation either foot.   Assessment & Plan:  Patient was evaluated and treated and all questions answered.  Onychomycosis with pain -Nails palliatively debrided x10  Return in about 61 days (around 11/23/2017).

## 2017-10-11 ENCOUNTER — Ambulatory Visit (INDEPENDENT_AMBULATORY_CARE_PROVIDER_SITE_OTHER): Payer: Medicare Other | Admitting: Diagnostic Neuroimaging

## 2017-10-11 ENCOUNTER — Encounter: Payer: Self-pay | Admitting: Diagnostic Neuroimaging

## 2017-10-11 VITALS — BP 177/89 | HR 73 | Ht 61.0 in | Wt 131.4 lb

## 2017-10-11 DIAGNOSIS — R413 Other amnesia: Secondary | ICD-10-CM | POA: Diagnosis not present
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
## 2017-10-11 NOTE — Patient Instructions (Signed)
-   check neuropsychology testing  - check B12, TSH

## 2017-10-12 ENCOUNTER — Telehealth: Payer: Self-pay | Admitting: *Deleted

## 2017-10-12 LAB — TSH: TSH: 7.72 u[IU]/mL — AB (ref 0.450–4.500)

## 2017-10-12 LAB — VITAMIN B12: Vitamin B-12: 531 pg/mL (ref 232–1245)

## 2017-10-12 NOTE — Telephone Encounter (Signed)
I called pt and relayed the results of the labs to pt.  She will contact Dr. Rex Kras about her TSH, and see about seeing another MD about her TSH.  She verbalized udnerstanding.

## 2017-10-12 NOTE — Telephone Encounter (Signed)
-----   Message from Penni Bombard, MD sent at 10/12/2017  3:36 PM EST ----- TSH is elevated. Needs PCP follow up for thyroid function. B12 is normal. Please call patient. -VRP

## 2017-10-17 DIAGNOSIS — F411 Generalized anxiety disorder: Secondary | ICD-10-CM | POA: Diagnosis not present

## 2017-10-17 DIAGNOSIS — R87615 Unsatisfactory cytologic smear of cervix: Secondary | ICD-10-CM | POA: Diagnosis not present

## 2017-10-18 DIAGNOSIS — C9000 Multiple myeloma not having achieved remission: Secondary | ICD-10-CM | POA: Diagnosis not present

## 2017-10-18 DIAGNOSIS — Z79899 Other long term (current) drug therapy: Secondary | ICD-10-CM | POA: Diagnosis not present

## 2017-10-18 DIAGNOSIS — C9002 Multiple myeloma in relapse: Secondary | ICD-10-CM | POA: Diagnosis not present

## 2017-10-18 NOTE — Telephone Encounter (Signed)
Spoke to pt and relayed her lab results on 10-12-17 see result note.

## 2017-10-19 ENCOUNTER — Telehealth: Payer: Self-pay | Admitting: Diagnostic Neuroimaging

## 2017-10-19 NOTE — Telephone Encounter (Signed)
Spoke to pt and will call her in am, as she was not able to talk right now.

## 2017-10-19 NOTE — Telephone Encounter (Signed)
Spoke to pt and she had people come to her door and I will call her back.

## 2017-10-19 NOTE — Telephone Encounter (Signed)
Faxed results to Dr. Hulan Fess. (with fa confirmation received.

## 2017-10-19 NOTE — Telephone Encounter (Signed)
Pt called she was not aware Dr Mamie Nick wanted her to see other Dr's. Said she really likes Dr Mamie Nick but she doesn't remember him telling her she needed to see any other Dr's. Please call to clarify her referrals.  Thank you

## 2017-10-20 NOTE — Telephone Encounter (Signed)
Pt returned RN's call. She will be available after 9:30 and before 11:00, she has appt's today. She will be back home by 3pm.

## 2017-10-20 NOTE — Telephone Encounter (Signed)
Spoke with pt and relayed that she was to f/u with her physician about her elevated TSH level (pcp or cardiology- pt unsure).  Then explained that she was made referral for neuropsych evaluation with [facility] - Dr. Daiva Huge.  (she was to call Tish Frederickson back to schedule appt [phone]).  Pt appreciated call and will call and make appts about both of these issues.

## 2017-10-24 DIAGNOSIS — D472 Monoclonal gammopathy: Secondary | ICD-10-CM | POA: Diagnosis not present

## 2017-10-24 DIAGNOSIS — C9002 Multiple myeloma in relapse: Secondary | ICD-10-CM | POA: Diagnosis not present

## 2017-10-24 DIAGNOSIS — R6889 Other general symptoms and signs: Secondary | ICD-10-CM | POA: Diagnosis not present

## 2017-10-24 DIAGNOSIS — C9000 Multiple myeloma not having achieved remission: Secondary | ICD-10-CM | POA: Diagnosis not present

## 2017-10-25 DIAGNOSIS — C9002 Multiple myeloma in relapse: Secondary | ICD-10-CM | POA: Diagnosis not present

## 2017-10-26 DIAGNOSIS — H0100A Unspecified blepharitis right eye, upper and lower eyelids: Secondary | ICD-10-CM | POA: Diagnosis not present

## 2017-11-01 DIAGNOSIS — C9002 Multiple myeloma in relapse: Secondary | ICD-10-CM | POA: Diagnosis not present

## 2017-11-01 DIAGNOSIS — Z5111 Encounter for antineoplastic chemotherapy: Secondary | ICD-10-CM | POA: Diagnosis not present

## 2017-11-03 DIAGNOSIS — C73 Malignant neoplasm of thyroid gland: Secondary | ICD-10-CM | POA: Diagnosis not present

## 2017-11-03 DIAGNOSIS — E890 Postprocedural hypothyroidism: Secondary | ICD-10-CM | POA: Diagnosis not present

## 2017-11-11 DIAGNOSIS — Z9109 Other allergy status, other than to drugs and biological substances: Secondary | ICD-10-CM | POA: Diagnosis not present

## 2017-11-11 DIAGNOSIS — R05 Cough: Secondary | ICD-10-CM | POA: Diagnosis not present

## 2017-11-12 DIAGNOSIS — I10 Essential (primary) hypertension: Secondary | ICD-10-CM | POA: Diagnosis not present

## 2017-11-12 DIAGNOSIS — J22 Unspecified acute lower respiratory infection: Secondary | ICD-10-CM | POA: Diagnosis not present

## 2017-11-12 DIAGNOSIS — D899 Disorder involving the immune mechanism, unspecified: Secondary | ICD-10-CM | POA: Diagnosis not present

## 2017-11-14 DIAGNOSIS — C9000 Multiple myeloma not having achieved remission: Secondary | ICD-10-CM | POA: Diagnosis not present

## 2017-11-14 DIAGNOSIS — I8393 Asymptomatic varicose veins of bilateral lower extremities: Secondary | ICD-10-CM | POA: Diagnosis not present

## 2017-11-14 DIAGNOSIS — E785 Hyperlipidemia, unspecified: Secondary | ICD-10-CM | POA: Diagnosis not present

## 2017-11-14 DIAGNOSIS — I129 Hypertensive chronic kidney disease with stage 1 through stage 4 chronic kidney disease, or unspecified chronic kidney disease: Secondary | ICD-10-CM | POA: Diagnosis not present

## 2017-11-14 DIAGNOSIS — R160 Hepatomegaly, not elsewhere classified: Secondary | ICD-10-CM | POA: Diagnosis not present

## 2017-11-14 DIAGNOSIS — N182 Chronic kidney disease, stage 2 (mild): Secondary | ICD-10-CM | POA: Diagnosis not present

## 2017-11-14 DIAGNOSIS — R319 Hematuria, unspecified: Secondary | ICD-10-CM | POA: Diagnosis not present

## 2017-11-14 DIAGNOSIS — D369 Benign neoplasm, unspecified site: Secondary | ICD-10-CM | POA: Diagnosis not present

## 2017-11-14 DIAGNOSIS — I639 Cerebral infarction, unspecified: Secondary | ICD-10-CM | POA: Diagnosis not present

## 2017-11-14 DIAGNOSIS — F439 Reaction to severe stress, unspecified: Secondary | ICD-10-CM | POA: Diagnosis not present

## 2017-11-14 DIAGNOSIS — E039 Hypothyroidism, unspecified: Secondary | ICD-10-CM | POA: Diagnosis not present

## 2017-11-14 DIAGNOSIS — C73 Malignant neoplasm of thyroid gland: Secondary | ICD-10-CM | POA: Diagnosis not present

## 2017-11-15 DIAGNOSIS — R197 Diarrhea, unspecified: Secondary | ICD-10-CM | POA: Diagnosis not present

## 2017-11-15 DIAGNOSIS — C9002 Multiple myeloma in relapse: Secondary | ICD-10-CM | POA: Diagnosis not present

## 2017-11-15 DIAGNOSIS — Z79899 Other long term (current) drug therapy: Secondary | ICD-10-CM | POA: Diagnosis not present

## 2017-11-15 DIAGNOSIS — Z5111 Encounter for antineoplastic chemotherapy: Secondary | ICD-10-CM | POA: Diagnosis not present

## 2017-11-15 DIAGNOSIS — Z5181 Encounter for therapeutic drug level monitoring: Secondary | ICD-10-CM | POA: Diagnosis not present

## 2017-11-19 ENCOUNTER — Other Ambulatory Visit: Payer: Self-pay

## 2017-11-19 ENCOUNTER — Encounter (HOSPITAL_COMMUNITY): Payer: Self-pay

## 2017-11-19 ENCOUNTER — Emergency Department (HOSPITAL_COMMUNITY): Payer: Medicare Other

## 2017-11-19 ENCOUNTER — Inpatient Hospital Stay (HOSPITAL_COMMUNITY)
Admission: EM | Admit: 2017-11-19 | Discharge: 2017-11-23 | DRG: 194 | Disposition: A | Discharge status: Home / Self Care | Payer: Medicare Other | Attending: Family Medicine | Admitting: Family Medicine

## 2017-11-19 DIAGNOSIS — J45909 Unspecified asthma, uncomplicated: Secondary | ICD-10-CM | POA: Diagnosis present

## 2017-11-19 DIAGNOSIS — Z7951 Long term (current) use of inhaled steroids: Secondary | ICD-10-CM | POA: Diagnosis not present

## 2017-11-19 DIAGNOSIS — Z85820 Personal history of malignant melanoma of skin: Secondary | ICD-10-CM

## 2017-11-19 DIAGNOSIS — J189 Pneumonia, unspecified organism: Secondary | ICD-10-CM | POA: Diagnosis not present

## 2017-11-19 DIAGNOSIS — T451X5A Adverse effect of antineoplastic and immunosuppressive drugs, initial encounter: Secondary | ICD-10-CM | POA: Diagnosis present

## 2017-11-19 DIAGNOSIS — Z8673 Personal history of transient ischemic attack (TIA), and cerebral infarction without residual deficits: Secondary | ICD-10-CM | POA: Diagnosis not present

## 2017-11-19 DIAGNOSIS — D84821 Immunodeficiency due to drugs: Secondary | ICD-10-CM

## 2017-11-19 DIAGNOSIS — Z9221 Personal history of antineoplastic chemotherapy: Secondary | ICD-10-CM | POA: Diagnosis not present

## 2017-11-19 DIAGNOSIS — Z79890 Hormone replacement therapy (postmenopausal): Secondary | ICD-10-CM

## 2017-11-19 DIAGNOSIS — Y95 Nosocomial condition: Secondary | ICD-10-CM | POA: Diagnosis present

## 2017-11-19 DIAGNOSIS — Z90710 Acquired absence of both cervix and uterus: Secondary | ICD-10-CM

## 2017-11-19 DIAGNOSIS — I10 Essential (primary) hypertension: Secondary | ICD-10-CM | POA: Diagnosis not present

## 2017-11-19 DIAGNOSIS — E039 Hypothyroidism, unspecified: Secondary | ICD-10-CM | POA: Diagnosis not present

## 2017-11-19 DIAGNOSIS — D701 Agranulocytosis secondary to cancer chemotherapy: Secondary | ICD-10-CM | POA: Diagnosis present

## 2017-11-19 DIAGNOSIS — Z79899 Other long term (current) drug therapy: Secondary | ICD-10-CM

## 2017-11-19 DIAGNOSIS — Z85850 Personal history of malignant neoplasm of thyroid: Secondary | ICD-10-CM

## 2017-11-19 DIAGNOSIS — R6883 Chills (without fever): Secondary | ICD-10-CM | POA: Diagnosis not present

## 2017-11-19 DIAGNOSIS — D472 Monoclonal gammopathy: Secondary | ICD-10-CM | POA: Diagnosis present

## 2017-11-19 DIAGNOSIS — R059 Cough, unspecified: Secondary | ICD-10-CM

## 2017-11-19 DIAGNOSIS — C9000 Multiple myeloma not having achieved remission: Secondary | ICD-10-CM | POA: Diagnosis present

## 2017-11-19 DIAGNOSIS — Z86718 Personal history of other venous thrombosis and embolism: Secondary | ICD-10-CM

## 2017-11-19 DIAGNOSIS — J1100 Influenza due to unidentified influenza virus with unspecified type of pneumonia: Principal | ICD-10-CM | POA: Diagnosis present

## 2017-11-19 DIAGNOSIS — R05 Cough: Secondary | ICD-10-CM

## 2017-11-19 DIAGNOSIS — E785 Hyperlipidemia, unspecified: Secondary | ICD-10-CM | POA: Diagnosis present

## 2017-11-19 DIAGNOSIS — R0981 Nasal congestion: Secondary | ICD-10-CM | POA: Diagnosis not present

## 2017-11-19 DIAGNOSIS — F419 Anxiety disorder, unspecified: Secondary | ICD-10-CM | POA: Diagnosis present

## 2017-11-19 DIAGNOSIS — F0390 Unspecified dementia without behavioral disturbance: Secondary | ICD-10-CM | POA: Diagnosis not present

## 2017-11-19 DIAGNOSIS — J101 Influenza due to other identified influenza virus with other respiratory manifestations: Secondary | ICD-10-CM | POA: Diagnosis not present

## 2017-11-19 DIAGNOSIS — Z7982 Long term (current) use of aspirin: Secondary | ICD-10-CM | POA: Diagnosis not present

## 2017-11-19 DIAGNOSIS — R0602 Shortness of breath: Secondary | ICD-10-CM | POA: Diagnosis not present

## 2017-11-19 HISTORY — DX: Monoclonal gammopathy: D47.2

## 2017-11-19 HISTORY — DX: Multiple myeloma not having achieved remission: C90.00

## 2017-11-19 LAB — INFLUENZA PANEL BY PCR (TYPE A & B)
Influenza A By PCR: POSITIVE — AB
Influenza B By PCR: NEGATIVE

## 2017-11-19 LAB — CBC WITH DIFFERENTIAL/PLATELET
BASOS ABS: 0 10*3/uL (ref 0.0–0.1)
BASOS PCT: 0 %
Eosinophils Absolute: 0 10*3/uL (ref 0.0–0.7)
Eosinophils Relative: 1 %
HEMATOCRIT: 38.6 % (ref 36.0–46.0)
Hemoglobin: 12.9 g/dL (ref 12.0–15.0)
Lymphocytes Relative: 30 %
Lymphs Abs: 1 10*3/uL (ref 0.7–4.0)
MCH: 30.2 pg (ref 26.0–34.0)
MCHC: 33.4 g/dL (ref 30.0–36.0)
MCV: 90.4 fL (ref 78.0–100.0)
MONO ABS: 0.4 10*3/uL (ref 0.1–1.0)
Monocytes Relative: 12 %
NEUTROS ABS: 1.8 10*3/uL (ref 1.7–7.7)
Neutrophils Relative %: 57 %
PLATELETS: 164 10*3/uL (ref 150–400)
RBC: 4.27 MIL/uL (ref 3.87–5.11)
RDW: 13.8 % (ref 11.5–15.5)
WBC: 3.2 10*3/uL — ABNORMAL LOW (ref 4.0–10.5)

## 2017-11-19 LAB — BASIC METABOLIC PANEL
ANION GAP: 13 (ref 5–15)
BUN: 19 mg/dL (ref 6–20)
CALCIUM: 9.3 mg/dL (ref 8.9–10.3)
CO2: 24 mmol/L (ref 22–32)
Chloride: 104 mmol/L (ref 101–111)
Creatinine, Ser: 0.77 mg/dL (ref 0.44–1.00)
Glucose, Bld: 106 mg/dL — ABNORMAL HIGH (ref 65–99)
Potassium: 3.6 mmol/L (ref 3.5–5.1)
Sodium: 141 mmol/L (ref 135–145)

## 2017-11-19 LAB — STREP PNEUMONIAE URINARY ANTIGEN: STREP PNEUMO URINARY ANTIGEN: NEGATIVE

## 2017-11-19 LAB — I-STAT CG4 LACTIC ACID, ED: LACTIC ACID, VENOUS: 1.23 mmol/L (ref 0.5–1.9)

## 2017-11-19 MED ORDER — VALACYCLOVIR HCL 500 MG PO TABS
500.0000 mg | ORAL_TABLET | Freq: Every day | ORAL | Status: DC
  Administered 2017-11-19 – 2017-11-23 (×5): 500 mg via ORAL
  Filled 2017-11-19 (×6): qty 1

## 2017-11-19 MED ORDER — LEVOTHYROXINE SODIUM 112 MCG PO TABS
112.0000 ug | ORAL_TABLET | ORAL | Status: DC
  Administered 2017-11-20: 112 ug via ORAL
  Filled 2017-11-19 (×2): qty 1

## 2017-11-19 MED ORDER — FLUTICASONE PROPIONATE 50 MCG/ACT NA SUSP
1.0000 | Freq: Two times a day (BID) | NASAL | Status: DC
  Administered 2017-11-19 – 2017-11-23 (×8): 1 via NASAL
  Filled 2017-11-19: qty 16

## 2017-11-19 MED ORDER — TRAMADOL HCL 50 MG PO TABS
50.0000 mg | ORAL_TABLET | Freq: Four times a day (QID) | ORAL | Status: DC | PRN
  Administered 2017-11-21 (×2): 50 mg via ORAL
  Filled 2017-11-19 (×2): qty 1

## 2017-11-19 MED ORDER — VANCOMYCIN HCL 500 MG IV SOLR
500.0000 mg | Freq: Two times a day (BID) | INTRAVENOUS | Status: DC
  Administered 2017-11-20 (×3): 500 mg via INTRAVENOUS
  Filled 2017-11-19 (×4): qty 500

## 2017-11-19 MED ORDER — ASPIRIN EC 81 MG PO TBEC
81.0000 mg | DELAYED_RELEASE_TABLET | Freq: Every day | ORAL | Status: DC
  Administered 2017-11-19 – 2017-11-22 (×4): 81 mg via ORAL
  Filled 2017-11-19 (×4): qty 1

## 2017-11-19 MED ORDER — CLONAZEPAM 0.5 MG PO TABS: 0.2500 mg | ORAL_TABLET | Freq: Every evening | ORAL | Status: DC | PRN

## 2017-11-19 MED ORDER — ACETAMINOPHEN 650 MG RE SUPP
650.0000 mg | Freq: Four times a day (QID) | RECTAL | Status: DC | PRN
Start: 2017-11-19 — End: 2017-11-23

## 2017-11-19 MED ORDER — ALPRAZOLAM 0.5 MG PO TABS
0.5000 mg | ORAL_TABLET | Freq: Two times a day (BID) | ORAL | Status: DC | PRN
  Administered 2017-11-20 – 2017-11-21 (×3): 0.5 mg via ORAL
  Filled 2017-11-19 (×3): qty 1

## 2017-11-19 MED ORDER — CLONAZEPAM 1 MG PO TABS
1.0000 mg | ORAL_TABLET | Freq: Every day | ORAL | Status: DC
  Administered 2017-11-19 – 2017-11-22 (×4): 1 mg via ORAL
  Filled 2017-11-19 (×4): qty 1

## 2017-11-19 MED ORDER — CARBOXYMETHYLCELLULOSE SODIUM 0.5 % OP SOLN: 1.0000 [drp] | Freq: Three times a day (TID) | OPHTHALMIC | Status: DC | PRN

## 2017-11-19 MED ORDER — DONEPEZIL HCL 10 MG PO TABS
10.0000 mg | ORAL_TABLET | Freq: Every day | ORAL | Status: DC
  Administered 2017-11-19 – 2017-11-23 (×5): 10 mg via ORAL
  Filled 2017-11-19 (×5): qty 1

## 2017-11-19 MED ORDER — IPRATROPIUM BROMIDE 0.02 % IN SOLN
0.5000 mg | Freq: Once | RESPIRATORY_TRACT | Status: AC
  Administered 2017-11-19: 0.5 mg via RESPIRATORY_TRACT
  Filled 2017-11-19: qty 2.5

## 2017-11-19 MED ORDER — ALBUTEROL SULFATE (2.5 MG/3ML) 0.083% IN NEBU
5.0000 mg | INHALATION_SOLUTION | Freq: Once | RESPIRATORY_TRACT | Status: AC
  Administered 2017-11-19: 5 mg via RESPIRATORY_TRACT
  Filled 2017-11-19: qty 6

## 2017-11-19 MED ORDER — SODIUM CHLORIDE 0.9 % IV SOLN
1.0000 g | Freq: Three times a day (TID) | INTRAVENOUS | Status: DC
  Administered 2017-11-19 – 2017-11-23 (×12): 1 g via INTRAVENOUS
  Filled 2017-11-19 (×13): qty 1

## 2017-11-19 MED ORDER — GUAIFENESIN ER 600 MG PO TB12
600.0000 mg | ORAL_TABLET | Freq: Two times a day (BID) | ORAL | Status: DC
  Administered 2017-11-19 – 2017-11-22 (×7): 600 mg via ORAL
  Filled 2017-11-19 (×7): qty 1

## 2017-11-19 MED ORDER — SODIUM CHLORIDE 0.9 % IV SOLN
1.0000 g | Freq: Once | INTRAVENOUS | Status: AC
  Administered 2017-11-19: 1 g via INTRAVENOUS
  Filled 2017-11-19: qty 1

## 2017-11-19 MED ORDER — IOPAMIDOL (ISOVUE-370) INJECTION 76%
INTRAVENOUS | Status: AC
  Filled 2017-11-19: qty 100

## 2017-11-19 MED ORDER — ONDANSETRON HCL 4 MG/2ML IJ SOLN: 4.0000 mg | Freq: Four times a day (QID) | INTRAMUSCULAR | Status: DC | PRN

## 2017-11-19 MED ORDER — LEVOTHYROXINE SODIUM 125 MCG PO TABS: 125.0000 ug | ORAL_TABLET | Freq: Every day | ORAL | Status: DC

## 2017-11-19 MED ORDER — ACETAMINOPHEN 325 MG PO TABS
650.0000 mg | ORAL_TABLET | Freq: Four times a day (QID) | ORAL | Status: DC | PRN
  Administered 2017-11-20 – 2017-11-22 (×4): 650 mg via ORAL
  Filled 2017-11-19 (×5): qty 2

## 2017-11-19 MED ORDER — POLYVINYL ALCOHOL 1.4 % OP SOLN
1.0000 [drp] | Freq: Three times a day (TID) | OPHTHALMIC | Status: DC | PRN
  Administered 2017-11-21: 1 [drp] via OPHTHALMIC
  Filled 2017-11-19: qty 15

## 2017-11-19 MED ORDER — LEVOTHYROXINE SODIUM 25 MCG PO TABS
125.0000 ug | ORAL_TABLET | ORAL | Status: DC
  Administered 2017-11-21: 125 ug via ORAL
  Filled 2017-11-19: qty 1

## 2017-11-19 MED ORDER — VANCOMYCIN HCL IN DEXTROSE 1-5 GM/200ML-% IV SOLN
1000.0000 mg | Freq: Once | INTRAVENOUS | Status: AC
  Administered 2017-11-19: 1000 mg via INTRAVENOUS
  Filled 2017-11-19 (×2): qty 200

## 2017-11-19 MED ORDER — AMLODIPINE BESYLATE 5 MG PO TABS
5.0000 mg | ORAL_TABLET | Freq: Every day | ORAL | Status: DC
  Administered 2017-11-19 – 2017-11-23 (×5): 5 mg via ORAL
  Filled 2017-11-19 (×5): qty 1

## 2017-11-19 MED ORDER — OXYBUTYNIN CHLORIDE ER 10 MG PO TB24
10.0000 mg | ORAL_TABLET | Freq: Every day | ORAL | Status: DC
  Administered 2017-11-19 – 2017-11-23 (×5): 10 mg via ORAL
  Filled 2017-11-19 (×5): qty 1

## 2017-11-19 MED ORDER — LEVOTHYROXINE SODIUM 112 MCG PO TABS: 112.0000 ug | ORAL_TABLET | ORAL | Status: DC

## 2017-11-19 MED ORDER — AZITHROMYCIN 250 MG PO TABS: ORAL_TABLET | ORAL | 0 refills | Status: DC

## 2017-11-19 MED ORDER — BENZONATATE 100 MG PO CAPS: 100.0000 mg | ORAL_CAPSULE | Freq: Three times a day (TID) | ORAL | 0 refills | Status: DC

## 2017-11-19 MED ORDER — IOPAMIDOL (ISOVUE-370) INJECTION 76%
INTRAVENOUS | Status: AC
  Administered 2017-11-19: 50 mL
  Filled 2017-11-19: qty 50

## 2017-11-19 MED ORDER — IRBESARTAN 150 MG PO TABS
150.0000 mg | ORAL_TABLET | Freq: Every day | ORAL | Status: DC
  Administered 2017-11-19 – 2017-11-23 (×5): 150 mg via ORAL
  Filled 2017-11-19 (×5): qty 1

## 2017-11-19 MED ORDER — SIMVASTATIN 20 MG PO TABS
20.0000 mg | ORAL_TABLET | Freq: Every evening | ORAL | Status: DC
  Administered 2017-11-19 – 2017-11-22 (×4): 20 mg via ORAL
  Filled 2017-11-19 (×4): qty 1

## 2017-11-19 MED ORDER — SODIUM CHLORIDE 0.9% FLUSH
3.0000 mL | Freq: Two times a day (BID) | INTRAVENOUS | Status: DC
  Administered 2017-11-19 – 2017-11-22 (×7): 3 mL via INTRAVENOUS

## 2017-11-19 MED ORDER — POLYETHYL GLYCOL-PROPYL GLYCOL 0.4-0.3 % OP SOLN: 1.0000 [drp] | Freq: Two times a day (BID) | OPHTHALMIC | Status: DC | PRN

## 2017-11-19 MED ORDER — LORATADINE 10 MG PO TABS
10.0000 mg | ORAL_TABLET | Freq: Every day | ORAL | Status: DC
  Administered 2017-11-19 – 2017-11-23 (×5): 10 mg via ORAL
  Filled 2017-11-19 (×5): qty 1

## 2017-11-19 MED ORDER — BENZONATATE 100 MG PO CAPS
100.0000 mg | ORAL_CAPSULE | Freq: Once | ORAL | Status: AC
  Administered 2017-11-19: 100 mg via ORAL
  Filled 2017-11-19: qty 1

## 2017-11-19 MED ORDER — ENOXAPARIN SODIUM 40 MG/0.4ML ~~LOC~~ SOLN
40.0000 mg | SUBCUTANEOUS | Status: DC
  Administered 2017-11-22: 40 mg via SUBCUTANEOUS
  Filled 2017-11-19 (×2): qty 0.4

## 2017-11-19 MED ORDER — POTASSIUM CHLORIDE CRYS ER 10 MEQ PO TBCR
10.0000 meq | EXTENDED_RELEASE_TABLET | Freq: Two times a day (BID) | ORAL | Status: DC
  Administered 2017-11-19 – 2017-11-20 (×3): 10 meq via ORAL
  Filled 2017-11-19 (×6): qty 1

## 2017-11-19 MED ORDER — ONDANSETRON HCL 4 MG PO TABS: 4.0000 mg | ORAL_TABLET | Freq: Four times a day (QID) | ORAL | Status: DC | PRN

## 2017-11-19 MED ORDER — IPRATROPIUM-ALBUTEROL 0.5-2.5 (3) MG/3ML IN SOLN
3.0000 mL | Freq: Four times a day (QID) | RESPIRATORY_TRACT | Status: DC | PRN
  Administered 2017-11-20: 3 mL via RESPIRATORY_TRACT
  Filled 2017-11-19: qty 3

## 2017-11-19 NOTE — ED Triage Notes (Signed)
Pt presents for 2-3 week hx of productive cough. Pt is actively receiving chemo at [facility] for multiple myeloma. Pt denies fever. Has been taking OTC meds with no improvement.

## 2017-11-19 NOTE — ED Notes (Signed)
ORDERED DIET TRAY FOR PT  

## 2017-11-19 NOTE — ED Provider Notes (Signed)
[facility] EMERGENCY DEPARTMENT Provider Note   CSN: [id number] Arrival date & time: 11/19/17  5366     History   Chief Complaint No chief complaint on file.   HPI [name] is a 77 y.o. female.  HPI   77 year old female with history of thyroid cancer, multiple myeloma currently on chemo presenting c/o cough.  For the past 2-3 weeks patient has had cough productive with phlegm.  She also endorsed sinus congestion, and having chills.  Cough is keeping her up at night.  She denies fever, headache, chest pain, worsening shortness of breath, abdominal pain, hemoptysis, leg swelling or calf pain.  She has been taking over-the-counter Delsym with minimal improvement.  She is currently receiving chemotherapy.  Last chemo was a week ago.  She lives at home by herself.  She has had her flu shot this year.  She denies prior history of PE.  Past Medical History:  Diagnosis Date  . Anxiety   . Arthritis   . Asthma   . Cancer (HCC)    THYROID, SKIN, NOSE, BONE  . Chronic kidney disease   . Diverticulosis    Pt reported on 03/15/12  . Eye problems    LOST VISION RIGHT EYE  . Hernia   . History of blood clots    LEG  . Hyperlipidemia   . Hypertension   . Melanoma ([place])   . Melanoma ([place]) 1989   chest  . MGUS (monoclonal gammopathy of unknown significance)   . MGUS (monoclonal gammopathy of unknown significance)   . Mitral valve prolapse   . Perforated bowel ([place])   . Peritonitis ([place])   . Stroke ([place])   . Thyroid disease     Patient Active Problem List   Diagnosis Date Noted  . Periorbital edema 01/22/2015  . Allergic rhinitis 08/12/2014  . Anxiety 07/04/2014  . 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 Multiple myeloma without remission ([place]) 11/01/2013  . Cerebral vascular accident ([place]) 02/19/2013  . Bloodgood disease 02/19/2013  . Acid reflux 02/19/2013  . Blood in the urine 02/19/2013  . Billowing mitral valve 02/19/2013  .  Adenomatous colon polyp 02/19/2013  . 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 Cataract, nuclear 12/16/2011  . Intragel vitreous hemorrhage ([place]) 12/16/2011  . Age-related macular degeneration, dry 12/02/2011  . Post-radiation retinopathy 12/02/2011  . Pseudophakia 12/02/2011  . MGUS (monoclonal gammopathy of unknown significance) 11/26/2011  . 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 Extrinsic asthma 09/21/2011  . Colon, diverticulosis 07/26/2011  . Benign hypertensive heart disease without heart failure 03/31/2011  . Hypercholesterolemia 03/31/2011  . Hypothyroidism 03/31/2011  . History of melanoma 03/31/2011  . Hx of thyroid cancer 03/31/2011  . H/O malignant melanoma of skin 03/31/2011    Past Surgical History:  Procedure Laterality Date  . ABDOMINAL HYSTERECTOMY    . BREAST LUMPECTOMY  12/2010   R breast  . BREAST LUMPECTOMY  2004   L axilla neg for cancer.  [name] CARDIOVASCULAR STRESS TEST  2006   NORMAL  . HERNIA REPAIR    . LEG SURGERY     VEIN & BLOOD CLOT  . MELANOMA EXCISION     R eye  . ROTATOR CUFF REPAIR  2004   RIGHT SHOULDER  . SKIN CANCER EXCISION  2011, 2013   squamous cell of nose x 2  . TRANSTHORACIC ECHOCARDIOGRAM  2006    OB History    No data available  Home Medications    Prior to Admission medications   Medication Sig Start Date End Date Taking? Authorizing Provider  acetaminophen (TYLENOL) 500 MG tablet Take 250-500 mg by mouth every 6 (six) hours as needed for mild pain, moderate pain, fever or headache.     [provider]  albuterol (VENTOLIN HFA) 108 (90 BASE) MCG/ACT inhaler Inhale 2 puffs into the lungs every 6 (six) hours as needed for wheezing or shortness of breath.     [provider]  ALPRAZolam Duanne Moron) 0.5 MG tablet Take 0.5 mg by mouth 2 (two) times daily as needed for anxiety or sleep.  07/26/11   [provider]  amLODipine (NORVASC) 5 MG tablet Take 1 tablet (5 mg total) by mouth  daily. 01/05/17   Burtis Junes, NP  aspirin 81 MG chewable tablet Chew 81 mg by mouth at bedtime.  07/26/11   [provider]  azelastine (ASTELIN) 0.1 % nasal spray Place 1 spray into both nostrils daily as needed for allergies.  05/08/17   [provider]  carboxymethylcellulose (REFRESH TEARS) 0.5 % SOLN Place 1 drop into both eyes 3 (three) times daily as needed (for allergies).    [provider]  clonazePAM (KLONOPIN) 0.5 MG tablet Take 0.25-0.5 mg by mouth at bedtime as needed for anxiety.  05/06/12   [provider]  fexofenadine (ALLEGRA) 180 MG tablet Take 180 mg by mouth daily.     [provider]  fluticasone (FLONASE) 50 MCG/ACT nasal spray Place 1 spray into both nostrils 2 (two) times daily. 07/09/15   Javier Glazier, MD  levothyroxine (SYNTHROID, LEVOTHROID) 112 MCG tablet Take 112 mcg by mouth See admin instructions. Take 1 tablet by mouth daily on weekends only. 11/13/15   [provider]  levothyroxine (SYNTHROID, LEVOTHROID) 125 MCG tablet Take 125 mcg by mouth daily. Take 1 tablet daily during the week only. 05/13/17   [provider]  loperamide (IMODIUM A-D) 2 MG tablet Take 2-4 mg by mouth 4 (four) times daily as needed for diarrhea or loose stools.    [provider]  MICARDIS 40 MG tablet Take 1 tablet (40 mg total) by mouth daily. 01/05/17   Burtis Junes, NP  Multiple Vitamins-Minerals (PRESERVISION AREDS PO) Take 1 capsule by mouth 2 (two) times a week.     [provider]  nystatin ointment (MYCOSTATIN) Apply 1 application topically 2 (two) times daily. As directed 09/12/16   Lacretia Leigh, MD  Polyethyl Glycol-Propyl Glycol (SYSTANE) 0.4-0.3 % SOLN Place 1 drop into both eyes 2 (two) times daily as needed (for allergies).    [provider]  simvastatin (ZOCOR) 20 MG tablet Take 1 tablet (20 mg total) by mouth every evening. 09/26/17   Burtis Junes, NP  traMADol (ULTRAM) 50 MG  tablet Take 50 mg by mouth every 6 (six) hours as needed for moderate pain.  05/03/17   [provider]  valACYclovir (VALTREX) 500 MG tablet Take 1 tablet by mouth daily.  06/23/15   [provider]    Family History Family History  Problem Relation Age of Onset  . Asthma Father   . Diabetes Father   . Heart failure Father   . Cancer Father        Prostate and kidney cancer  . Emphysema Father   . Diabetes Sister   . Cancer Sister   . Stroke Mother 59       Her mother passed away from this  stroke at the age of 48  . Cancer Brother        brain cancer & prostate cancer  . Cervical cancer Sister   . Cancer Sister        Recurrent cancer involving the neck and the jaw    Social History Social History   Tobacco Use  . Smoking status: Passive Smoke Exposure - Never Smoker  . Smokeless tobacco: Never Used  . Tobacco comment: Exposure rarely through parents but significant exposure at work  Substance Use Topics  . Alcohol use: No    Alcohol/week: 0.0 oz  . Drug use: No     Allergies   Aldactone [spironolactone]; Decadrol [dexamethasone]; Doxycycline; Fluconazole; Sertraline hcl; Hydrocodone; Levofloxacin; Lipitor [atorvastatin]; Losartan; Prednisolone; Sertraline; Ultram [tramadol]; Levofloxacin; Prednisone; Pseudoephedrine; Sertraline hcl; Sudafed [pseudoephedrine hcl]; Trazodone and nefazodone; Adhesive [tape]; Hydrocodone-acetaminophen; Latex; and Quinolones   Review of Systems Review of Systems  All other systems reviewed and are negative.    Physical Exam Updated Vital Signs There were no vitals taken for this visit.  Physical Exam  Constitutional: She appears well-developed and well-nourished. No distress.  Elderly female nontoxic in appearance  HENT:  Head: Atraumatic.  Right Ear: External ear normal.  Left Ear: External ear normal.  Mouth/Throat: Oropharynx is clear and moist.  Eyes: Conjunctivae are normal.  Neck: Neck supple. No JVD  present. No thyromegaly present.  Cardiovascular: Normal rate, regular rhythm and intact distal pulses.  Pulmonary/Chest: Effort normal. No stridor. She has rales (Crackles heard at the lung base).  Abdominal: Soft. There is no tenderness.  Musculoskeletal: She exhibits no edema.  Lymphadenopathy:    She has no cervical adenopathy.  Neurological: She is alert.  Skin: No rash noted.  Psychiatric: She has a normal mood and affect.  Nursing note and vitals reviewed.    ED Treatments / Results  Labs (all labs ordered are listed, but only abnormal results are displayed) Labs Reviewed  CBC WITH DIFFERENTIAL/PLATELET - Abnormal; Notable for the following components:      Result Value   WBC 3.2 (*)    All other components within normal limits  BASIC METABOLIC PANEL - Abnormal; Notable for the following components:   Glucose, Bld 106 (*)    All other components within normal limits  CULTURE, BLOOD (ROUTINE X 2)  CULTURE, BLOOD (ROUTINE X 2)  I-STAT CG4 LACTIC ACID, ED    EKG  EKG Interpretation None      Date: 11/19/2017  Rate: 63  Rhythm: normal sinus rhythm  QRS Axis: normal  Intervals: normal  ST/T Wave abnormalities: normal  Conduction Disutrbances: none  Narrative Interpretation:   Old EKG Reviewed: No significant changes noted     Radiology Dg Chest 2 View  Result Date: 11/19/2017 CLINICAL DATA:  Productive cough. EXAM: CHEST  2 VIEW COMPARISON:  05/20/2016 FINDINGS: AP and lateral views of the chest show hyperexpansion without focal airspace consolidation. The cardio pericardial silhouette is enlarged. Right Port-A-Cath remains in place. Calcified granuloma noted right lung. Degenerative changes evident in each shoulder. Telemetry leads overlie the chest. IMPRESSION: Stable.  No acute findings. Electronically Signed   By: Misty Stanley M.D.   On: 11/19/2017 09:20   Ct Angio Chest Pe W And/or Wo Contrast  Result Date: 11/19/2017 CLINICAL DATA:  Productive cough 2-3  weeks. Some shortness-of-breath. EXAM: CT ANGIOGRAPHY CHEST WITH CONTRAST TECHNIQUE: Multidetector CT imaging of the chest was performed using the standard protocol during bolus administration of intravenous contrast. Multiplanar CT image reconstructions  and MIPs were obtained to evaluate the vascular anatomy. CONTRAST:  4m ISOVUE-370 IOPAMIDOL (ISOVUE-370) INJECTION 76% COMPARISON:  CT chest 04/12/2012 and abdominal CT 05/21/2017 FINDINGS: Cardiovascular: Mild cardiomegaly. Mild calcified plaque over the left anterior descending and lateral circumflex coronary arteries. Pulmonary arteries are well opacified without evidence of emboli. Remaining vascular structures are within normal. Mediastinum/Nodes: No evidence of mediastinal or hilar adenopathy. Small amount of fluid adjacent the distal esophagus. Remaining mediastinal structures are within normal. Lungs/Pleura: Lungs are adequately inflated with mild bibasilar heterogeneous airspace opacification which may be due to atelectasis or infection. No evidence of effusion. Calcified granuloma over the right upper lobe. Upper Abdomen: Decreased size of hypodensity over the right lobe of the liver measuring 3.6 cm (previous 6 cm) with central radiodensity compatible patient's previous liver biopsy and marker clip. Musculoskeletal: Degenerate change of the spine. Right chest Port-A-Cath. Review of the MIP images confirms the above findings. IMPRESSION: No evidence of pulmonary emboli. Bibasilar heterogeneous airspace opacification which may be due to atelectasis or pneumonia. Cardiomegaly and evidence of mild atherosclerotic coronary artery disease. Stable post biopsy change over the right liver. Electronically Signed   By: DMarin OlpM.D.   On: 11/19/2017 11:13    Procedures Procedures (including critical care time)  Medications Ordered in ED Medications  ceFEPIme (MAXIPIME) 1 g in sodium chloride 0.9 % 100 mL IVPB (not administered)  vancomycin (VANCOCIN)  IVPB 1000 mg/200 mL premix (not administered)    Followed by  vancomycin (VANCOCIN) 500 mg in sodium chloride 0.9 % 100 mL IVPB (not administered)  guaiFENesin (MUCINEX) 12 hr tablet 600 mg (not administered)  ipratropium-albuterol (DUONEB) 0.5-2.5 (3) MG/3ML nebulizer solution 3 mL (not administered)  albuterol (PROVENTIL) (2.5 MG/3ML) 0.083% nebulizer solution 5 mg (5 mg Nebulization Given 11/19/17 0936)  ipratropium (ATROVENT) nebulizer solution 0.5 mg (0.5 mg Nebulization Given 11/19/17 0936)  benzonatate (TESSALON) capsule 100 mg (100 mg Oral Given 11/19/17 0935)  iopamidol (ISOVUE-370) 76 % injection (50 mLs  Contrast Given 11/19/17 1036)     Initial Impression / Assessment and Plan / ED Course  I have reviewed the triage vital signs and the nursing notes.  Pertinent labs & imaging results that were available during my care of the patient were reviewed by me and considered in my medical decision making (see chart for details).     BP (!) 124/95   Pulse 76   Temp 98.9 F (37.2 C) (Rectal)   Resp 18   SpO2 100%    Final Clinical Impressions(s) / ED Diagnoses   Final diagnoses:  Cough  Community acquired pneumonia, unspecified laterality    ED Discharge Orders        Ordered    azithromycin (ZITHROMAX Z-PAK) 250 MG tablet  Status:  Discontinued     11/19/17 1126    benzonatate (TESSALON) 100 MG capsule  Every 8 hours     11/19/17 1126     9:22 AM This is a cancer patient currently receiving chemotherapy here with a productive cough ongoing for the past several weeks.  Lung exam remarkable for rales.  She does not appears to be fluid overload.  She is not hypoxic and I have low suspicion for PE.  SHe however does have hx of prior DVT and is a cancer pt.  Chest x-ray ordered, basic labs ordered. Care discussed with Dr. IEllender Hose  9:28 AM Initial chest x-ray without any acute abnormalities.  Leukocytopenia with a WBC 3.2 likely secondary to her current chemotherapy.  After  discussing with my attending, will obtain a chest CT angiogram to rule out PE or underlying pneumonia that may not show up on chest x-ray.  Breathing treatment provided.  Cough medication provided.  11:19 AM Chest CT angiogram without evidence of PE.  Bibasilar heterogenous airspace opacification which may be due to atelectasis or pneumonia.  Since patient is immunocompromise and she has this cough, I felt compelled to provide her with antibiotic and admission.  Will treat with cefepime and vancomycin.  Pt agrees with admission.  Will consult medicine.    12:08 PM Appreciate consultation from [place] who agrees to see pt in the ER and will admit for further management of her condition.    Domenic Moras, PA-C 11/19/17 1209    Duffy Bruce, MD 11/19/17 [phone]

## 2017-11-19 NOTE — Progress Notes (Signed)
Text paged [name] patient's influenza PCR results- positive for influenza A;negative for influenza B.

## 2017-11-19 NOTE — ED Notes (Signed)
Given pt water per Tray(RN)

## 2017-11-19 NOTE — Progress Notes (Addendum)
Addendum: Adding Cefepime for continued gram negative coverage.   Plan: Cefepime 1g IV every 8 hours.   Pharmacy Antibiotic Note  [name] is a 77 y.o. female with MM admitted on 11/19/2017 with pneumonia.  Pharmacy has been consulted for Vancomycin dosing. WBC low at 3.2, ANC 1.8K. Blood cultures pending. Est CrCl ~ 56 ml/min. Patient has received Cefepime 1g in ED.   Plan: Vancomycin 1000mg  IV x1 then 500mg  IV every 12 hours. Follow-up that GN coverage is continued.  Monitor renal function, clinical status, and culture results.       Temp (24hrs), Avg:98.9 F (37.2 C), Min:98.9 F (37.2 C), Max:98.9 F (37.2 C)  Recent Labs  Lab 11/19/17 0850  WBC 3.2*  CREATININE 0.77    CrCl cannot be calculated (Unknown ideal weight.).  CrCl ~56 ml/min using weight of 58.5 kg from 11/15/17  Allergies  Allergen Reactions  . Aldactone [Spironolactone] Palpitations    Burning in stomach Burning in stomach Burning in stomach  . Decadrol [Dexamethasone] Anaphylaxis and Palpitations    Other reaction(s): Unknown  . Doxycycline     SEVERE HEADACHES  . Fluconazole Swelling    Facial swelling  . Sertraline Hcl Palpitations  . Hydrocodone Rash  . Levofloxacin Hives    Leg Pain  . Lipitor [Atorvastatin] Other (See Comments) and Hives    Patient stated legs hurt so bad she could hardly walk  Patient stated legs hurt so bad she could hardly walk   . Losartan Swelling  . Prednisolone Hives    Headache, left eye swelling, forehead swelling  . Sertraline Anxiety and Rash  . Ultram [Tramadol] Rash    Headaches  Headaches  Headaches  Headaches   . Levofloxacin Other (See Comments)    Severe Myalgias  . Prednisone   . Pseudoephedrine     stroke  . Sertraline Hcl   . Sudafed [Pseudoephedrine Hcl]   . Trazodone And Nefazodone     Side affects  . Adhesive [Tape] Rash  . Hydrocodone-Acetaminophen Anxiety  . Latex Rash  . Quinolones Anxiety    Antimicrobials this  admission: Vanc 2/16 >> Cefepime 2/16 x1  Dose adjustments this admission:   Microbiology results: 2/16 Blood >>  Thank you for allowing pharmacy to be a part of this patient's care.  Sloan Leiter, PharmD, BCPS, BCCCP Clinical Pharmacist Clinical phone 11/19/2017 until 3:[phone] After hours, please call #[phone] 11/19/2017 11:49 AM

## 2017-11-19 NOTE — Progress Notes (Signed)
Pt admitted to 6N21 from ED on droplet precautions.  Will obtain specimen to test for Influenza.
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
## 2017-11-19 NOTE — Progress Notes (Signed)
Had called for continuous pulse oximetry and it did not work.  Called for another one and told none available.

## 2017-11-19 NOTE — Progress Notes (Signed)
Patient seen and examined and discussed with NP Debbora Lacrosse. Patient is a 77 year old female with multiple myeloma undergoing chemotherapy who was in her usual state of health until 3-4 days ago when she developed onset of cough and chest congestion. She does have some mild dementia so her history is not entirely consistent. On physical exam patient appears tired but otherwise well. She is able to speak in full sentences. Lungs with market rhonchi at right base with some rales at left base but otherwise reasonable air entry. CT shows airspace disease at bases bilaterally. Agree with admission for HCAP and treatment as noted above.

## 2017-11-20 LAB — BLOOD CULTURE ID PANEL (REFLEXED)
Acinetobacter baumannii: NOT DETECTED
CANDIDA PARAPSILOSIS: NOT DETECTED
CANDIDA TROPICALIS: NOT DETECTED
Candida albicans: NOT DETECTED
Candida glabrata: NOT DETECTED
Candida krusei: NOT DETECTED
Enterobacter cloacae complex: NOT DETECTED
Enterobacteriaceae species: NOT DETECTED
Enterococcus species: NOT DETECTED
Escherichia coli: NOT DETECTED
HAEMOPHILUS INFLUENZAE: NOT DETECTED
KLEBSIELLA OXYTOCA: NOT DETECTED
KLEBSIELLA PNEUMONIAE: NOT DETECTED
Listeria monocytogenes: NOT DETECTED
METHICILLIN RESISTANCE: DETECTED — AB
Neisseria meningitidis: NOT DETECTED
Proteus species: NOT DETECTED
Pseudomonas aeruginosa: NOT DETECTED
SERRATIA MARCESCENS: NOT DETECTED
STAPHYLOCOCCUS AUREUS BCID: NOT DETECTED
STREPTOCOCCUS PYOGENES: NOT DETECTED
Staphylococcus species: DETECTED — AB
Streptococcus agalactiae: NOT DETECTED
Streptococcus pneumoniae: NOT DETECTED
Streptococcus species: NOT DETECTED

## 2017-11-20 LAB — CBC
HCT: 37 % (ref 36.0–46.0)
Hemoglobin: 12 g/dL (ref 12.0–15.0)
MCH: 29.3 pg (ref 26.0–34.0)
MCHC: 32.4 g/dL (ref 30.0–36.0)
MCV: 90.2 fL (ref 78.0–100.0)
PLATELETS: 170 10*3/uL (ref 150–400)
RBC: 4.1 MIL/uL (ref 3.87–5.11)
RDW: 13.7 % (ref 11.5–15.5)
WBC: 4.4 10*3/uL (ref 4.0–10.5)

## 2017-11-20 LAB — BASIC METABOLIC PANEL
ANION GAP: 12 (ref 5–15)
BUN: 11 mg/dL (ref 6–20)
CO2: 22 mmol/L (ref 22–32)
Calcium: 8.5 mg/dL — ABNORMAL LOW (ref 8.9–10.3)
Chloride: 106 mmol/L (ref 101–111)
Creatinine, Ser: 0.65 mg/dL (ref 0.44–1.00)
GFR calc Af Amer: >60 mL/min (ref >60)
GLUCOSE: 98 mg/dL (ref 65–99)
Potassium: 3.5 mmol/L (ref 3.5–5.1)
SODIUM: 140 mmol/L (ref 135–145)

## 2017-11-20 LAB — MRSA PCR SCREENING: MRSA BY PCR: NEGATIVE

## 2017-11-20 LAB — HIV ANTIBODY (ROUTINE TESTING W REFLEX): HIV SCREEN 4TH GENERATION: NONREACTIVE

## 2017-11-20 MED ORDER — PHENOL 1.4 % MT LIQD
1.0000 | OROMUCOSAL | Status: DC | PRN
  Filled 2017-11-20: qty 177

## 2017-11-20 MED ORDER — OSELTAMIVIR PHOSPHATE 30 MG PO CAPS
30.0000 mg | ORAL_CAPSULE | Freq: Two times a day (BID) | ORAL | Status: DC
Start: 2017-11-20 — End: 2017-11-23
  Administered 2017-11-20 – 2017-11-23 (×7): 30 mg via ORAL
  Filled 2017-11-20 (×7): qty 1

## 2017-11-20 MED ORDER — MENTHOL 3 MG MT LOZG
1.0000 | LOZENGE | OROMUCOSAL | Status: DC | PRN
  Administered 2017-11-21 – 2017-11-23 (×2): 3 mg via ORAL
  Filled 2017-11-20 (×3): qty 9

## 2017-11-20 NOTE — Progress Notes (Signed)
PHARMACY - PHYSICIAN COMMUNICATION CRITICAL VALUE ALERT - BLOOD CULTURE IDENTIFICATION (BCID)  Results for orders placed or performed during the hospital encounter of 11/19/17  Blood Culture ID Panel (Reflexed) (Collected: 11/19/2017 12:30 PM)  Result Value Ref Range   Enterococcus species NOT DETECTED NOT DETECTED   Listeria monocytogenes NOT DETECTED NOT DETECTED   Staphylococcus species DETECTED (A) NOT DETECTED   Staphylococcus aureus NOT DETECTED NOT DETECTED   Methicillin resistance DETECTED (A) NOT DETECTED   Streptococcus species NOT DETECTED NOT DETECTED   Streptococcus agalactiae NOT DETECTED NOT DETECTED   Streptococcus pneumoniae NOT DETECTED NOT DETECTED   Streptococcus pyogenes NOT DETECTED NOT DETECTED   Acinetobacter baumannii NOT DETECTED NOT DETECTED   Enterobacteriaceae species NOT DETECTED NOT DETECTED   Enterobacter cloacae complex NOT DETECTED NOT DETECTED   Escherichia coli NOT DETECTED NOT DETECTED   Klebsiella oxytoca NOT DETECTED NOT DETECTED   Klebsiella pneumoniae NOT DETECTED NOT DETECTED   Proteus species NOT DETECTED NOT DETECTED   Serratia marcescens NOT DETECTED NOT DETECTED   Haemophilus influenzae NOT DETECTED NOT DETECTED   Neisseria meningitidis NOT DETECTED NOT DETECTED   Pseudomonas aeruginosa NOT DETECTED NOT DETECTED   Candida albicans NOT DETECTED NOT DETECTED   Candida glabrata NOT DETECTED NOT DETECTED   Candida krusei NOT DETECTED NOT DETECTED   Candida parapsilosis NOT DETECTED NOT DETECTED   Candida tropicalis NOT DETECTED NOT DETECTED    Name of physician (or Provider) Contacted: Dr Maylene Roes  Changes to prescribed antibiotics required: None  Levester Fresh, PharmD, BCPS, BCCCP Clinical Pharmacist Clinical phone for 11/20/2017 from 7a-3:30p: [phone] If after 3:30p, please call main pharmacy at: [phone] 11/20/2017 11:24 AM

## 2017-11-20 NOTE — Progress Notes (Signed)
Pt is up and out of bed, walking in the hall independently with IV pole, tolerated fairly well.

## 2017-11-20 NOTE — Progress Notes (Signed)
Called respiratory for breathing treatment

## 2017-11-20 NOTE — Progress Notes (Signed)
Notified Dr. Maylene Roes by text that pt was positive for flu A.

## 2017-11-20 NOTE — Progress Notes (Signed)
PROGRESS NOTE    [name]  RZN:[id number] DOB: 04/28/1941 DOA: 11/19/2017 PCP: Hulan Fess, MD     Brief Narrative:  [name] is a 77 y.o. 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  Patient reports 2-3 weeks of productive cough with whitish to gray sputum and low-grade fevers at home.  She reports pleuritic chest pain with coughing.  In the ER, CT chest showed bilateral/basilar infiltrates consistent with pneumonia. Influenza positive.  Assessment & Plan:   Principal Problem:   HCAP (healthcare-associated pneumonia) Active Problems:   Immunocompromised state due to drug therapy   History of CVA (cerebrovascular accident)   Hypertension   Hyperlipidemia   Asthma   Smoldering multiple myeloma (SMM) (HCC)   Hypothyroidism, adult   Anxiety disorder   Mild dementia   HCAP and influenza -Patient immunocompromised, on chemo -CT chest showed bilateral/basilar infiltrates consistent with pneumonia -Continue vanco, cefepime. Check MRSA PCR; if negative discontinue vanco -Tamiflu -Blood cultures showing 1 of 2 GPC, likely contaminant. Await finalization   Smoldering multiple myeloma -Currently on velcade and daratumumab, valtrex  -Followed by Verner Chol with [facility] in [facility]  HTN -Continue norvasc, avapro  -BP well controlled   Hx CVA -Continue aspirin, statin  Hypothyroidism -Continue synthroid  Mild dementia, anxiety disorder -Continue aricept, xanax, klonopin    DVT prophylaxis: Lovenox Code Status: Full Family Communication: No family at bedside Disposition Plan: Pending improvement   Consultants:   None  Procedures:   None   Antimicrobials:  Anti-infectives (From admission, onward)   Start     Dose/Rate  Route Frequency Ordered Stop   11/20/17 1000  oseltamivir (TAMIFLU) capsule 30 mg    Comments:  Tamiflu 30 mg BID for CrCl <60 mL/min   30 mg Oral 2 times daily 11/20/17 0751 11/25/17 0959   11/20/17 0000  vancomycin (VANCOCIN) 500 mg in sodium chloride 0.9 % 100 mL IVPB     500 mg 100 mL/hr over 60 Minutes Intravenous Every 12 hours 11/19/17 1153     11/19/17 2000  ceFEPIme (MAXIPIME) 1 g in sodium chloride 0.9 % 100 mL IVPB     1 g 200 mL/hr over 30 Minutes Intravenous Every 8 hours 11/19/17 1217     11/19/17 1600  valACYclovir (VALTREX) tablet 500 mg     500 mg Oral Daily 11/19/17 1230     11/19/17 1200  vancomycin (VANCOCIN) IVPB 1000 mg/200 mL premix     1,000 mg 200 mL/hr over 60 Minutes Intravenous  Once 11/19/17 1153 11/19/17 1500   11/19/17 1145  ceFEPIme (MAXIPIME) 1 g in sodium chloride 0.9 % 100 mL IVPB     1 g 200 mL/hr over 30 Minutes Intravenous  Once 11/19/17 1139 11/19/17 1329   11/19/17 0000  azithromycin (ZITHROMAX Z-PAK) 250 MG tablet  Status:  Discontinued        11/19/17 1126 11/19/17         Subjective: Continues to feel poorly. Productive cough of gray sputum   Objective: Vitals:   11/19/17 2200 11/20/17 0513 11/20/17 0957 11/20/17 1117  BP: 117/65 129/65  (!) 121/52  Pulse: 60 63  68  Resp: _0 Temp: 98.8 F (37.1 C) 98.7 F (37.1 C)  98.2 F (36.8 C)  TempSrc: Oral Oral  Oral  SpO2: 94%  92% 95% 96%    Intake/Output Summary (Last 24 hours) at 11/20/2017 1312 Last data filed at 11/20/2017 0849 Gross per 24 hour  Intake 900 ml  Output 1525 ml  Net -625 ml   There were no vitals filed for this visit.  Examination:  General exam: Appears calm and comfortable  Respiratory system: Crackles bilateral base. Respiratory effort normal. Cardiovascular system: S1 & S2 heard, RRR. No JVD, murmurs, rubs, gallops or clicks. No pedal edema. Gastrointestinal system: Abdomen is nondistended, soft and nontender. No organomegaly or masses felt.  Normal bowel sounds heard. Central nervous system: Alert and oriented. No focal neurological deficits. Extremities: Symmetric 5 x 5 power. Skin: No rashes, lesions or ulcers Psychiatry: Judgement and insight appear normal. Mood & affect appropriate.   Data Reviewed: I have personally reviewed following labs and imaging studies  CBC: Recent Labs  Lab 11/19/17 0850 11/20/17 0356  WBC 3.2* 4.4  NEUTROABS 1.8  --   HGB 12.9 12.0  HCT 38.6 37.0  MCV 90.4 90.2  PLT 164 825   Basic Metabolic Panel: Recent Labs  Lab 11/19/17 0850 11/20/17 0356  NA 141 140  K 3.6 3.5  CL 104 106  CO2 24 22  GLUCOSE 106* 98  BUN 19 11  CREATININE 0.77 0.65  CALCIUM 9.3 8.5*   GFR: CrCl cannot be calculated (Unknown ideal weight.). Liver Function Tests: No results for input(s): AST, ALT, ALKPHOS, BILITOT, PROT, ALBUMIN in the last 168 hours. No results for input(s): LIPASE, AMYLASE in the last 168 hours. No results for input(s): AMMONIA in the last 168 hours. Coagulation Profile: No results for input(s): INR, PROTIME in the last 168 hours. Cardiac Enzymes: No results for input(s): CKTOTAL, CKMB, CKMBINDEX, TROPONINI in the last 168 hours. BNP (last 3 results) No results for input(s): PROBNP in the last 8760 hours. HbA1C: No results for input(s): HGBA1C in the last 72 hours. CBG: No results for input(s): GLUCAP in the last 168 hours. Lipid Profile: No results for input(s): CHOL, HDL, LDLCALC, TRIG, CHOLHDL, LDLDIRECT in the last 72 hours. Thyroid Function Tests: No results for input(s): TSH, T4TOTAL, FREET4, T3FREE, THYROIDAB in the last 72 hours. Anemia Panel: No results for input(s): VITAMINB12, FOLATE, FERRITIN, TIBC, IRON, RETICCTPCT in the last 72 hours. Sepsis Labs: Recent Labs  Lab 11/19/17 1248  LATICACIDVEN 1.23    Recent Results (from the past 240 hour(s))  Blood culture (routine x 2)     Status: None (Preliminary result)   Collection Time: 11/19/17 12:30 PM  Result  Value Ref Range Status   Specimen Description BLOOD LEFT ANTECUBITAL  Final   Special Requests   Final    BOTTLES DRAWN AEROBIC AND ANAEROBIC Blood Culture adequate volume   Culture  Setup Time   Final    GRAM POSITIVE COCCI IN CLUSTERS ANAEROBIC BOTTLE ONLY CRITICAL RESULT CALLED TO, READ BACK BY AND VERIFIED WITH: Ailene Rud AT 1106 11/20/17 BY L BENFIELD Performed at [facility], [place] [address] [place], [place] [address]    Culture GRAM POSITIVE COCCI  Final   Report Status PENDING  Incomplete  Blood Culture ID Panel (Reflexed)     Status: Abnormal   Collection Time: 11/19/17 12:30 PM  Result Value Ref Range Status   Enterococcus species NOT DETECTED NOT DETECTED Final   Listeria monocytogenes NOT DETECTED NOT DETECTED Final   Staphylococcus species DETECTED (A) NOT DETECTED Final    Comment: Methicillin (oxacillin) resistant coagulase negative staphylococcus. Possible blood culture contaminant (unless  isolated from more than one blood culture draw or clinical case suggests pathogenicity). No antibiotic treatment is indicated for blood  culture contaminants. CRITICAL RESULT CALLED TO, READ BACK BY AND VERIFIED WITH: M MACCIA,PHARMD AT 1106 11/20/17 BY L BENFIELD    Staphylococcus aureus NOT DETECTED NOT DETECTED Final   Methicillin resistance DETECTED (A) NOT DETECTED Final    Comment: CRITICAL RESULT CALLED TO, READ BACK BY AND VERIFIED WITH: M MACCIA,PHARMD AT 1106 11/20/17 BY L BENFIELD    Streptococcus species NOT DETECTED NOT DETECTED Final   Streptococcus agalactiae NOT DETECTED NOT DETECTED Final   Streptococcus pneumoniae NOT DETECTED NOT DETECTED Final   Streptococcus pyogenes NOT DETECTED NOT DETECTED Final   Acinetobacter baumannii NOT DETECTED NOT DETECTED Final   Enterobacteriaceae species NOT DETECTED NOT DETECTED Final   Enterobacter cloacae complex NOT DETECTED NOT DETECTED Final   Escherichia coli NOT DETECTED NOT DETECTED Final   Klebsiella oxytoca  NOT DETECTED NOT DETECTED Final   Klebsiella pneumoniae NOT DETECTED NOT DETECTED Final   Proteus species NOT DETECTED NOT DETECTED Final   Serratia marcescens NOT DETECTED NOT DETECTED Final   Haemophilus influenzae NOT DETECTED NOT DETECTED Final   Neisseria meningitidis NOT DETECTED NOT DETECTED Final   Pseudomonas aeruginosa NOT DETECTED NOT DETECTED Final   Candida albicans NOT DETECTED NOT DETECTED Final   Candida glabrata NOT DETECTED NOT DETECTED Final   Candida krusei NOT DETECTED NOT DETECTED Final   Candida parapsilosis NOT DETECTED NOT DETECTED Final   Candida tropicalis NOT DETECTED NOT DETECTED Final    Comment: Performed at [facility], [address] [place], [place] [address]  Blood culture (routine x 2)     Status: None (Preliminary result)   Collection Time: 11/19/17 12:53 PM  Result Value Ref Range Status   Specimen Description BLOOD RIGHT ANTECUBITAL  Final   Special Requests   Final    BOTTLES DRAWN AEROBIC AND ANAEROBIC Blood Culture adequate volume   Culture   Final    NO GROWTH < 24 HOURS Performed at [facility], [place] [address] [place], [place] [address]    Report Status PENDING  Incomplete       Radiology Studies: Dg Chest 2 View  Result Date: 11/19/2017 CLINICAL DATA:  Productive cough. EXAM: CHEST  2 VIEW COMPARISON:  05/20/2016 FINDINGS: AP and lateral views of the chest show hyperexpansion without focal airspace consolidation. The cardio pericardial silhouette is enlarged. Right Port-A-Cath remains in place. Calcified granuloma noted right lung. Degenerative changes evident in each shoulder. Telemetry leads overlie the chest. IMPRESSION: Stable.  No acute findings. Electronically Signed   By: Misty Stanley M.D.   On: 11/19/2017 09:20   Ct Angio Chest Pe W And/or Wo Contrast  Result Date: 11/19/2017 CLINICAL DATA:  Productive cough 2-3 weeks. Some shortness-of-breath. EXAM: CT ANGIOGRAPHY CHEST WITH CONTRAST TECHNIQUE: Multidetector CT  imaging of the chest was performed using the standard protocol during bolus administration of intravenous contrast. Multiplanar CT image reconstructions and MIPs were obtained to evaluate the vascular anatomy. CONTRAST:  34m ISOVUE-370 IOPAMIDOL (ISOVUE-370) INJECTION 76% COMPARISON:  CT chest 04/12/2012 and abdominal CT 05/21/2017 FINDINGS: Cardiovascular: Mild cardiomegaly. Mild calcified plaque over the left anterior descending and lateral circumflex coronary arteries. Pulmonary arteries are well opacified without evidence of emboli. Remaining vascular structures are within normal. Mediastinum/Nodes: No evidence of mediastinal or hilar adenopathy. Small amount of fluid adjacent the distal esophagus. Remaining mediastinal structures are within normal. Lungs/Pleura: Lungs are adequately inflated  with mild bibasilar heterogeneous airspace opacification which may be due to atelectasis or infection. No evidence of effusion. Calcified granuloma over the right upper lobe. Upper Abdomen: Decreased size of hypodensity over the right lobe of the liver measuring 3.6 cm (previous 6 cm) with central radiodensity compatible patient's previous liver biopsy and marker clip. Musculoskeletal: Degenerate change of the spine. Right chest Port-A-Cath. Review of the MIP images confirms the above findings. IMPRESSION: No evidence of pulmonary emboli. Bibasilar heterogeneous airspace opacification which may be due to atelectasis or pneumonia. Cardiomegaly and evidence of mild atherosclerotic coronary artery disease. Stable post biopsy change over the right liver. Electronically Signed   By: Marin Olp M.D.   On: 11/19/2017 11:13      Scheduled Meds: . amLODipine  5 mg Oral Daily  . aspirin EC  81 mg Oral QHS  . clonazePAM  1 mg Oral QHS  . donepezil  10 mg Oral Daily  . enoxaparin (LOVENOX) injection  40 mg Subcutaneous Q24H  . fluticasone  1 spray Each Nare BID  . guaiFENesin  600 mg Oral BID  . irbesartan  150 mg Oral  Daily  . levothyroxine  112 mcg Oral Once per day on Sun Sat  . [START ON 11/21/2017] levothyroxine  125 mcg Oral Once per day on Mon Tue Wed Thu Fri  . loratadine  10 mg Oral Daily  . oseltamivir  30 mg Oral BID  . oxybutynin  10 mg Oral Daily  . potassium chloride  10 mEq Oral BID  . simvastatin  20 mg Oral QPM  . sodium chloride flush  3 mL Intravenous Q12H  . valACYclovir  500 mg Oral Daily   Continuous Infusions: . ceFEPime (MAXIPIME) IV Stopped (11/20/17 0456)  . vancomycin 500 mg (11/20/17 1203)     LOS: 1 day    Time spent: 40 minutes   Dessa Phi, DO [facility] [email or web address] Password [email or web address] 11/20/2017, 1:12 PM

## 2017-11-21 ENCOUNTER — Ambulatory Visit: Payer: Medicare Other | Admitting: Hematology

## 2017-11-21 LAB — BASIC METABOLIC PANEL
ANION GAP: 9 (ref 5–15)
BUN: 7 mg/dL (ref 6–20)
CHLORIDE: 107 mmol/L (ref 101–111)
CO2: 24 mmol/L (ref 22–32)
Calcium: 8.6 mg/dL — ABNORMAL LOW (ref 8.9–10.3)
Creatinine, Ser: 0.7 mg/dL (ref 0.44–1.00)
GFR calc Af Amer: >60 mL/min (ref >60)
Glucose, Bld: 93 mg/dL (ref 65–99)
POTASSIUM: 3.9 mmol/L (ref 3.5–5.1)
SODIUM: 140 mmol/L (ref 135–145)

## 2017-11-21 LAB — CBC
HCT: 35.4 % — ABNORMAL LOW (ref 36.0–46.0)
HEMOGLOBIN: 11.7 g/dL — AB (ref 12.0–15.0)
MCH: 30.5 pg (ref 26.0–34.0)
MCHC: 33.1 g/dL (ref 30.0–36.0)
MCV: 92.2 fL (ref 78.0–100.0)
PLATELETS: 185 10*3/uL (ref 150–400)
RBC: 3.84 MIL/uL — AB (ref 3.87–5.11)
RDW: 14 % (ref 11.5–15.5)
WBC: 5.6 10*3/uL (ref 4.0–10.5)

## 2017-11-21 MED ORDER — LEVOTHYROXINE SODIUM 25 MCG PO TABS
125.0000 ug | ORAL_TABLET | ORAL | Status: DC
  Administered 2017-11-23: 125 ug via ORAL
  Filled 2017-11-21: qty 1

## 2017-11-21 MED ORDER — LEVOTHYROXINE SODIUM 112 MCG PO TABS: 112.0000 ug | ORAL_TABLET | ORAL | Status: DC

## 2017-11-21 NOTE — Progress Notes (Signed)
PROGRESS NOTE    [name]  ZES:[id number] DOB: 01-05-1941 DOA: 11/19/2017 PCP: Hulan Fess, MD     Brief Narrative:  [name] is a 77 y.o. 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  Patient reports 2-3 weeks of productive cough with whitish to gray sputum and low-grade fevers at home.  She reports pleuritic chest pain with coughing.  In the ER, CT chest showed bilateral/basilar infiltrates consistent with pneumonia. Influenza positive.  Assessment & Plan:   Principal Problem:   HCAP (healthcare-associated pneumonia) Active Problems:   Immunocompromised state due to drug therapy   History of CVA (cerebrovascular accident)   Hypertension   Hyperlipidemia   Asthma   Smoldering multiple myeloma (SMM) (HCC)   Hypothyroidism, adult   Anxiety disorder   Mild dementia   HCAP and influenza -Patient immunocompromised, on chemo -CT chest showed bilateral/basilar infiltrates consistent with pneumonia -Continue cefepime. MRSA PCR negative discontinue vanco -Tamiflu -Blood cultures showing 1 of 2 GPC, likely contaminant. Await finalization   Smoldering multiple myeloma -Currently on velcade and daratumumab, valtrex  -Followed by Verner Chol with [facility] in [facility]  HTN -Continue norvasc, avapro  -BP well controlled   Hx CVA -Continue aspirin, statin  Hypothyroidism -Continue synthroid  Mild dementia, anxiety disorder -Continue aricept, xanax, klonopin    DVT prophylaxis: Lovenox Code Status: Full Family Communication: No family at bedside Disposition Plan: Pending improvement   Consultants:   None  Procedures:   None   Antimicrobials:  Anti-infectives (From admission, onward)   Start     Dose/Rate Route Frequency  Ordered Stop   11/20/17 1000  oseltamivir (TAMIFLU) capsule 30 mg    Comments:  Tamiflu 30 mg BID for CrCl <60 mL/min   30 mg Oral 2 times daily 11/20/17 0751 11/25/17 0959   11/20/17 0000  vancomycin (VANCOCIN) 500 mg in sodium chloride 0.9 % 100 mL IVPB  Status:  Discontinued     500 mg 100 mL/hr over 60 Minutes Intravenous Every 12 hours 11/19/17 1153 11/21/17 0756   11/19/17 2000  ceFEPIme (MAXIPIME) 1 g in sodium chloride 0.9 % 100 mL IVPB     1 g 200 mL/hr over 30 Minutes Intravenous Every 8 hours 11/19/17 1217     11/19/17 1600  valACYclovir (VALTREX) tablet 500 mg     500 mg Oral Daily 11/19/17 1230     11/19/17 1200  vancomycin (VANCOCIN) IVPB 1000 mg/200 mL premix     1,000 mg 200 mL/hr over 60 Minutes Intravenous  Once 11/19/17 1153 11/19/17 1500   11/19/17 1145  ceFEPIme (MAXIPIME) 1 g in sodium chloride 0.9 % 100 mL IVPB     1 g 200 mL/hr over 30 Minutes Intravenous  Once 11/19/17 1139 11/19/17 1329   11/19/17 0000  azithromycin (ZITHROMAX Z-PAK) 250 MG tablet  Status:  Discontinued        11/19/17 1126 11/19/17        Subjective: Continues to have productive cough, gray sputum. Multiple complaints regarding staff, food. Not feeling well overall.   Objective: Vitals:   11/20/17 1117 11/20/17 1659 11/20/17 1942 11/21/17 0315  BP: (!) 121/52 (!) 113/91 133/65 136/64  Pulse: 68 68 (!) 57 67  Resp: _0 Temp: 98.2 F (36.8 C) 98.9 F (37.2 C) 97.7 F (36.5 C) 98.5  F (36.9 C)  TempSrc: Oral Oral Oral Oral  SpO2: 96% 98%      Intake/Output Summary (Last 24 hours) at 11/21/2017 1130 Last data filed at 11/20/2017 1949 Gross per 24 hour  Intake 120 ml  Output 600 ml  Net -480 ml   There were no vitals filed for this visit.  Examination: General exam: Appears calm and comfortable  Respiratory system: Clear to auscultation, bibasilar crackles. Respiratory effort normal. Cardiovascular system: S1 & S2 heard, RRR. No JVD, murmurs, rubs, gallops or clicks.  No pedal edema. Gastrointestinal system: Abdomen is nondistended, soft and nontender. No organomegaly or masses felt. Normal bowel sounds heard. Central nervous system: Alert and oriented. No focal neurological deficits. Extremities: Symmetric 5 x 5 power. Skin: No rashes, lesions or ulcers Psychiatry: Judgement and insight appear normal. Mood & affect appropriate.    Data Reviewed: I have personally reviewed following labs and imaging studies  CBC: Recent Labs  Lab 11/19/17 0850 11/20/17 0356 11/21/17 0309  WBC 3.2* 4.4 5.6  NEUTROABS 1.8  --   --   HGB 12.9 12.0 11.7*  HCT 38.6 37.0 35.4*  MCV 90.4 90.2 92.2  PLT 164 170 185   Basic Metabolic Panel: Recent Labs  Lab 11/19/17 0850 11/20/17 0356 11/21/17 0309  NA 141 140 140  K 3.6 3.5 3.9  CL 104 106 107  CO2 24 22 24  GLUCOSE 106* 98 93  BUN 19 11 7  CREATININE 0.77 0.65 0.70  CALCIUM 9.3 8.5* 8.6*   GFR: CrCl cannot be calculated (Unknown ideal weight.). Liver Function Tests: No results for input(s): AST, ALT, ALKPHOS, BILITOT, PROT, ALBUMIN in the last 168 hours. No results for input(s): LIPASE, AMYLASE in the last 168 hours. No results for input(s): AMMONIA in the last 168 hours. Coagulation Profile: No results for input(s): INR, PROTIME in the last 168 hours. Cardiac Enzymes: No results for input(s): CKTOTAL, CKMB, CKMBINDEX, TROPONINI in the last 168 hours. BNP (last 3 results) No results for input(s): PROBNP in the last 8760 hours. HbA1C: No results for input(s): HGBA1C in the last 72 hours. CBG: No results for input(s): GLUCAP in the last 168 hours. Lipid Profile: No results for input(s): CHOL, HDL, LDLCALC, TRIG, CHOLHDL, LDLDIRECT in the last 72 hours. Thyroid Function Tests: No results for input(s): TSH, T4TOTAL, FREET4, T3FREE, THYROIDAB in the last 72 hours. Anemia Panel: No results for input(s): VITAMINB12, FOLATE, FERRITIN, TIBC, IRON, RETICCTPCT in the last 72 hours. Sepsis Labs: Recent  Labs  Lab 11/19/17 1248  LATICACIDVEN 1.23    Recent Results (from the past 240 hour(s))  Blood culture (routine x 2)     Status: None (Preliminary result)   Collection Time: 11/19/17 12:30 PM  Result Value Ref Range Status   Specimen Description BLOOD LEFT ANTECUBITAL  Final   Special Requests   Final    BOTTLES DRAWN AEROBIC AND ANAEROBIC Blood Culture adequate volume   Culture  Setup Time   Final    GRAM POSITIVE COCCI IN CLUSTERS ANAEROBIC BOTTLE ONLY CRITICAL RESULT CALLED TO, READ BACK BY AND VERIFIED WITH: M MACCIA,PHARMD AT 1106 11/20/17 BY L BENFIELD Performed at [facility], [address] [place], [place] [address]    Culture GRAM POSITIVE COCCI  Final   Report Status PENDING  Incomplete  Blood Culture ID Panel (Reflexed)     Status: Abnormal   Collection Time: 11/19/17 12:30 PM  Result Value Ref Range Status   Enterococcus species NOT DETECTED NOT DETECTED   Final   Listeria monocytogenes NOT DETECTED NOT DETECTED Final   Staphylococcus species DETECTED (A) NOT DETECTED Final    Comment: Methicillin (oxacillin) resistant coagulase negative staphylococcus. Possible blood culture contaminant (unless isolated from more than one blood culture draw or clinical case suggests pathogenicity). No antibiotic treatment is indicated for blood  culture contaminants. CRITICAL RESULT CALLED TO, READ BACK BY AND VERIFIED WITH: M MACCIA,PHARMD AT 1106 11/20/17 BY L BENFIELD    Staphylococcus aureus NOT DETECTED NOT DETECTED Final   Methicillin resistance DETECTED (A) NOT DETECTED Final    Comment: CRITICAL RESULT CALLED TO, READ BACK BY AND VERIFIED WITH: M MACCIA,PHARMD AT 1106 11/20/17 BY L BENFIELD    Streptococcus species NOT DETECTED NOT DETECTED Final   Streptococcus agalactiae NOT DETECTED NOT DETECTED Final   Streptococcus pneumoniae NOT DETECTED NOT DETECTED Final   Streptococcus pyogenes NOT DETECTED NOT DETECTED Final   Acinetobacter baumannii NOT DETECTED NOT  DETECTED Final   Enterobacteriaceae species NOT DETECTED NOT DETECTED Final   Enterobacter cloacae complex NOT DETECTED NOT DETECTED Final   Escherichia coli NOT DETECTED NOT DETECTED Final   Klebsiella oxytoca NOT DETECTED NOT DETECTED Final   Klebsiella pneumoniae NOT DETECTED NOT DETECTED Final   Proteus species NOT DETECTED NOT DETECTED Final   Serratia marcescens NOT DETECTED NOT DETECTED Final   Haemophilus influenzae NOT DETECTED NOT DETECTED Final   Neisseria meningitidis NOT DETECTED NOT DETECTED Final   Pseudomonas aeruginosa NOT DETECTED NOT DETECTED Final   Candida albicans NOT DETECTED NOT DETECTED Final   Candida glabrata NOT DETECTED NOT DETECTED Final   Candida krusei NOT DETECTED NOT DETECTED Final   Candida parapsilosis NOT DETECTED NOT DETECTED Final   Candida tropicalis NOT DETECTED NOT DETECTED Final    Comment: Performed at [facility], [address] [place], [place] [address]  Blood culture (routine x 2)     Status: None (Preliminary result)   Collection Time: 11/19/17 12:53 PM  Result Value Ref Range Status   Specimen Description BLOOD RIGHT ANTECUBITAL  Final   Special Requests   Final    BOTTLES DRAWN AEROBIC AND ANAEROBIC Blood Culture adequate volume   Culture   Final    NO GROWTH < 24 HOURS Performed at [facility], [address] [place], [place] [address]    Report Status PENDING  Incomplete  MRSA PCR Screening     Status: None   Collection Time: 11/20/17 12:44 PM  Result Value Ref Range Status   MRSA by PCR NEGATIVE NEGATIVE Final    Comment:        The GeneXpert MRSA Assay (FDA approved for NASAL specimens only), is one component of a comprehensive MRSA colonization surveillance program. It is not intended to diagnose MRSA infection nor to guide or monitor treatment for MRSA infections. Performed at  Hospital Lab, [address] [place], [place] [address]        Radiology Studies: No results  found.    Scheduled Meds: . amLODipine  5 mg Oral Daily  . aspirin EC  81 mg Oral QHS  . clonazePAM  1 mg Oral QHS  . donepezil  10 mg Oral Daily  . enoxaparin (LOVENOX) injection  40 mg Subcutaneous Q24H  . fluticasone  1 spray Each Nare BID  . guaiFENesin  600 mg Oral BID  . irbesartan  150 mg Oral Daily  . [START ON 11/26/2017] levothyroxine  112 mcg Oral Once per day on Sun Sat  . [  START ON 11/22/2017] levothyroxine  125 mcg Oral Once per day on Mon Tue Wed Thu Fri  . loratadine  10 mg Oral Daily  . oseltamivir  30 mg Oral BID  . oxybutynin  10 mg Oral Daily  . simvastatin  20 mg Oral QPM  . sodium chloride flush  3 mL Intravenous Q12H  . valACYclovir  500 mg Oral Daily   Continuous Infusions: . ceFEPime (MAXIPIME) IV Stopped (11/21/17 0350)     LOS: 2 days    Time spent: 30 minutes    , DO [facility] [email or web address] Password [email or web address] 11/21/2017, 11:30 AM   

## 2017-11-22 LAB — CBC
HCT: 36 % (ref 36.0–46.0)
Hemoglobin: 11.7 g/dL — ABNORMAL LOW (ref 12.0–15.0)
MCH: 30 pg (ref 26.0–34.0)
MCHC: 32.5 g/dL (ref 30.0–36.0)
MCV: 92.3 fL (ref 78.0–100.0)
PLATELETS: 204 10*3/uL (ref 150–400)
RBC: 3.9 MIL/uL (ref 3.87–5.11)
RDW: 13.8 % (ref 11.5–15.5)
WBC: 4.9 10*3/uL (ref 4.0–10.5)

## 2017-11-22 LAB — BASIC METABOLIC PANEL
Anion gap: 12 (ref 5–15)
BUN: 5 mg/dL — ABNORMAL LOW (ref 6–20)
CALCIUM: 8.8 mg/dL — AB (ref 8.9–10.3)
CO2: 24 mmol/L (ref 22–32)
Chloride: 105 mmol/L (ref 101–111)
Creatinine, Ser: 0.67 mg/dL (ref 0.44–1.00)
GLUCOSE: 94 mg/dL (ref 65–99)
Potassium: 3.7 mmol/L (ref 3.5–5.1)
SODIUM: 141 mmol/L (ref 135–145)

## 2017-11-22 LAB — CULTURE, BLOOD (ROUTINE X 2): SPECIAL REQUESTS: ADEQUATE

## 2017-11-22 MED ORDER — GUAIFENESIN-DM 100-10 MG/5ML PO SYRP
5.0000 mL | ORAL_SOLUTION | ORAL | Status: DC | PRN
  Administered 2017-11-22: 5 mL via ORAL
  Filled 2017-11-22: qty 5

## 2017-11-22 MED ORDER — ALBUTEROL SULFATE (2.5 MG/3ML) 0.083% IN NEBU
2.5000 mg | INHALATION_SOLUTION | RESPIRATORY_TRACT | Status: DC | PRN
Start: 2017-11-22 — End: 2017-11-23

## 2017-11-22 NOTE — Evaluation (Signed)
Physical Therapy Evaluation Patient Details Name: [name] MRN: [id number] DOB: 1941/03/09 Today's Date: 11/22/2017   History of Present Illness  77yo female reporting 2-3 weeks of productive cough, chest CT consistent with pneumonia. 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 Diagnosed with HCAP/immunocompromised state secondary to drug therapy. PMH anxiety, CA, CKD, loss of vision R eye, hx blood clots, HTN, mitral valve prolapse, smouldering multiple myeloma, CVA, thyroid disease, hx leg surgery, R rotator cuff repair   Clinical Impression   Patient received in bed, very pleasant and willing to participate with skilled PT services. She is able to complete functional bed mobility and transfers with Mod(I), able to ambulate approximately 686f while pushing IV pole in the hallway, but does not require IV pole for balance. SpO2 remained constant at 95-98% during session on room air. At this point she appears to be at her baseline level of function and is not in need of skilled PT services at this time or moving forward, but will likely benefit from participation with mobility tech during her hospital stay. PT signing off, thank you for the referral.     Follow Up Recommendations No PT follow up    Equipment Recommendations  None recommended by PT    Recommendations for Other Services       Precautions / Restrictions Precautions Precautions: None Restrictions Weight Bearing Restrictions: No      Mobility  Bed Mobility Overal bed mobility: Modified Independent             General bed mobility comments: increased time   Transfers Overall transfer level: Modified independent Equipment used: None             General transfer comment: increased time and effort   Ambulation/Gait Ambulation/Gait assistance: Supervision Ambulation Distance (Feet): 600 Feet Assistive device: 1 person hand held assist(pushed IV pole ) Gait  Pattern/deviations: WFL(Within Functional Limits)     General Gait Details: gait mechanics WNL, no unsteadiness or loss of balance; patient pushed IV pole during gait but did not need it to maintain balance today   Stairs            Wheelchair Mobility    Modified Rankin (Stroke Patients Only)       Balance Overall balance assessment: No apparent balance deficits (not formally assessed)                                           Pertinent Vitals/Pain Pain Assessment: No/denies pain    Home Living Family/patient expects to be discharged to:: Private residence Living Arrangements: Alone   Type of Home: House Home Access: Level entry     Home Layout: One level Home Equipment: None      Prior Function Level of Independence: Independent               Hand Dominance        Extremity/Trunk Assessment        Lower Extremity Assessment Lower Extremity Assessment: Overall WFL for tasks assessed    Cervical / Trunk Assessment Cervical / Trunk Assessment: Normal  Communication      Cognition Arousal/Alertness: Awake/alert Behavior During Therapy: WFL for tasks assessed/performed Overall Cognitive Status: Within Functional Limits for tasks assessed  General Comments      Exercises     Assessment/Plan    PT Assessment Patent does not need any further PT services  PT Problem List         PT Treatment Interventions      PT Goals (Current goals can be found in the Care Plan section)  Acute Rehab PT Goals Patient Stated Goal: to feel better  PT Goal Formulation: With patient Time For Goal Achievement: 12/06/17 Potential to Achieve Goals: Good    Frequency     Barriers to discharge        Co-evaluation               AM-PAC PT "6 Clicks" Daily Activity  Outcome Measure Difficulty turning over in bed (including adjusting bedclothes, sheets and blankets)?:  None Difficulty moving from lying on back to sitting on the side of the bed? : None Difficulty sitting down on and standing up from a chair with arms (e.g., wheelchair, bedside commode, etc,.)?: None Help needed moving to and from a bed to chair (including a wheelchair)?: None Help needed walking in hospital room?: None Help needed climbing 3-5 steps with a railing? : A Little 6 Click Score: 23    End of Session   Activity Tolerance: Patient tolerated treatment well Patient left: in chair;with call bell/phone within reach;with nursing/sitter in room Nurse Communication: Mobility status PT Visit Diagnosis: Muscle weakness (generalized) (M62.81)    Time: 3143-8887 PT Time Calculation (min) (ACUTE ONLY): 23 min   Charges:   PT Evaluation $PT Eval Low Complexity: 1 Low PT Treatments $Gait Training: 8-22 mins   PT G Codes:        Deniece Ree PT, DPT, CBIS  Supplemental Physical Therapist [place]   Pager [phone]

## 2017-11-22 NOTE — Progress Notes (Addendum)
Pharmacy Antibiotic Note  [name] is a 77 y.o. female with MM admitted on 11/19/2017 with pneumonia.  Pharmacy has been consulted for cefepime dosing.  Patient's renal function has been stable.  Afebrile, WBC WNL.   Plan: Continue cefepime 1g IV Q8H Pharmacy will sign off.  Consider adding stop date of 11/25/17 if not changed to PO.  Thank you for the consult!     Temp (24hrs), Avg:98.9 F (37.2 C), Min:98.4 F (36.9 C), Max:99.4 F (37.4 C)  Recent Labs  Lab 11/19/17 0850 11/19/17 1248 11/20/17 0356 11/21/17 0309 11/22/17 0503  WBC 3.2*  --  4.4 5.6 4.9  CREATININE 0.77  --  0.65 0.70 0.67  LATICACIDVEN  --  1.23  --   --   --     CrCl cannot be calculated (Unknown ideal weight.).  CrCl ~56 ml/min using weight of 58.5 kg from 11/15/17  Allergies  Allergen Reactions  . Aldactone [Spironolactone] Palpitations    Burning in stomach Burning in stomach Burning in stomach  . Decadrol [Dexamethasone] Anaphylaxis and Palpitations    Other reaction(s): Unknown  . Doxycycline     SEVERE HEADACHES  . Fluconazole Swelling    Facial swelling  . Sertraline Hcl Palpitations  . Hydrocodone Rash  . Levofloxacin Hives    Leg Pain  . Lipitor [Atorvastatin] Other (See Comments) and Hives    Patient stated legs hurt so bad she could hardly walk  Patient stated legs hurt so bad she could hardly walk   . Losartan Swelling  . Prednisolone Hives    Headache, left eye swelling, forehead swelling  . Sertraline Anxiety and Rash  . Ultram [Tramadol] Rash    Headaches  Headaches  Headaches  Headaches   . Levofloxacin Other (See Comments)    Severe Myalgias  . Prednisone   . Pseudoephedrine     stroke  . Sertraline Hcl   . Sudafed [Pseudoephedrine Hcl]   . Trazodone And Nefazodone     Side affects  . Adhesive [Tape] Rash  . Hydrocodone-Acetaminophen Anxiety  . Latex Rash  . Quinolones Anxiety    Vanc 2/16 >> 2/18 Cefepime 2/16 >>  Tamiflu 2/17>> (2/21) Valacyclovir  PTA >>  2/16 Blood - 1 of 2 GPC (BCID MRSE, contaminant) 2/16 Flu A - positive 2/16 Strep pneumo Ag - negative   [name] D. Mina Marble, PharmD, BCPS Pager:  [phone] 11/22/2017, 10:14 AM

## 2017-11-22 NOTE — Care Management Important Message (Signed)
Important Message  Patient Details  Name: [name] MRN: [id number] Date of Birth: 07/07/1941   Medicare Important Message Given:  Yes    Carles Collet, RN 11/22/2017, 10:18 AM

## 2017-11-22 NOTE — Progress Notes (Signed)
PROGRESS NOTE    [name]  TWK:[id number] DOB: 1941/04/14 DOA: 11/19/2017 PCP: Hulan Fess, MD     Brief Narrative:  [name] is a 77 y.o. 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  Patient reports 2-3 weeks of productive cough with whitish to gray sputum and low-grade fevers at home.  She reports pleuritic chest pain with coughing.  In the ER, CT chest showed bilateral/basilar infiltrates consistent with pneumonia. Influenza positive.  Assessment & Plan:   Principal Problem:   HCAP (healthcare-associated pneumonia) Active Problems:   Immunocompromised state due to drug therapy   History of CVA (cerebrovascular accident)   Hypertension   Hyperlipidemia   Asthma   Smoldering multiple myeloma (SMM) (HCC)   Hypothyroidism, adult   Anxiety disorder   Mild dementia   HCAP and influenza -Patient immunocompromised, on chemo -CT chest showed bilateral/basilar infiltrates consistent with pneumonia -Continue cefepime. MRSA PCR negative discontinue vanco -Tamiflu -Blood cultures showing 1 of 2 GPC, likely contaminant -Continues to feel poorly with cough, wheezing. Added breathing tx and cough syrup. Patient has allergy listed to steroids.   Smoldering multiple myeloma -Currently on velcade and daratumumab, valtrex  -Followed by [name] [facility]  HTN -Continue norvasc, avapro  -Stable   Hx CVA -Continue aspirin, statin  Hypothyroidism -Continue synthroid  Mild dementia, anxiety disorder -Continue aricept, xanax, klonopin    DVT prophylaxis: Lovenox Code Status: Full Family Communication: No family at bedside Disposition Plan: Pending improvement   Consultants:   None  Procedures:   None    Antimicrobials:  Anti-infectives (From admission, onward)   Start     Dose/Rate Route Frequency Ordered Stop   11/20/17 1000  oseltamivir (TAMIFLU) capsule 30 mg    Comments:  Tamiflu 30 mg BID for CrCl <60 mL/min   30 mg Oral 2 times daily 11/20/17 0751 11/25/17 0959   11/20/17 0000  vancomycin (VANCOCIN) 500 mg in sodium chloride 0.9 % 100 mL IVPB  Status:  Discontinued     500 mg 100 mL/hr over 60 Minutes Intravenous Every 12 hours 11/19/17 1153 11/21/17 0756   11/19/17 2000  ceFEPIme (MAXIPIME) 1 g in sodium chloride 0.9 % 100 mL IVPB     1 g 200 mL/hr over 30 Minutes Intravenous Every 8 hours 11/19/17 1217     11/19/17 1600  valACYclovir (VALTREX) tablet 500 mg     500 mg Oral Daily 11/19/17 1230     11/19/17 1200  vancomycin (VANCOCIN) IVPB 1000 mg/200 mL premix     1,000 mg 200 mL/hr over 60 Minutes Intravenous  Once 11/19/17 1153 11/19/17 1500   11/19/17 1145  ceFEPIme (MAXIPIME) 1 g in sodium chloride 0.9 % 100 mL IVPB     1 g 200 mL/hr over 30 Minutes Intravenous  Once 11/19/17 1139 11/19/17 1329   11/19/17 0000  azithromycin (ZITHROMAX Z-PAK) 250 MG tablet  Status:  Discontinued        11/19/17 1126 11/19/17        Subjective: States she continues to feel poorly, "what can I do to get better?" Continues to have productive cough.    Objective: Vitals:   11/21/17 0315 11/21/17 1524 11/21/17 2140 11/22/17 0442  BP: 136/64 136/71 138/64 (!) 158/68  Pulse: 67 66 66 68  Resp: 17 17 17  17  Temp: 98.5 F (36.9 C) 98.4 F (36.9 C) 99 F (37.2 C) 99.4 F (37.4 C)  TempSrc: Oral Oral Oral Oral  SpO2:  98% 95% 96%    Intake/Output Summary (Last 24 hours) at 11/22/2017 1222 Last data filed at 11/21/2017 1600 Gross per 24 hour  Intake 280 ml  Output 500 ml  Net -220 ml   There were no vitals filed for this visit.  Examination: General exam: Appears calm and comfortable  Respiratory system: +bilateral wheezing. Respiratory effort normal. Cardiovascular system:  S1 & S2 heard, RRR. No JVD, murmurs, rubs, gallops or clicks. No pedal edema. Gastrointestinal system: Abdomen is nondistended, soft and nontender. No organomegaly or masses felt. Normal bowel sounds heard. Central nervous system: Alert and oriented Extremities: Symmetric 5 x 5 power. Skin: No rashes, lesions or ulcers Psychiatry: Mild dementia by history     Data Reviewed: I have personally reviewed following labs and imaging studies  CBC: Recent Labs  Lab 11/19/17 0850 11/20/17 0356 11/21/17 0309 11/22/17 0503  WBC 3.2* 4.4 5.6 4.9  NEUTROABS 1.8  --   --   --   HGB 12.9 12.0 11.7* 11.7*  HCT 38.6 37.0 35.4* 36.0  MCV 90.4 90.2 92.2 92.3  PLT 164 170 185 248   Basic Metabolic Panel: Recent Labs  Lab 11/19/17 0850 11/20/17 0356 11/21/17 0309 11/22/17 0503  NA 141 140 140 141  K 3.6 3.5 3.9 3.7  CL 104 106 107 105  CO2 24 22 24 24   GLUCOSE 106* 98 93 94  BUN 19 11 7  5*  CREATININE 0.77 0.65 0.70 0.67  CALCIUM 9.3 8.5* 8.6* 8.8*   GFR: CrCl cannot be calculated (Unknown ideal weight.). Liver Function Tests: No results for input(s): AST, ALT, ALKPHOS, BILITOT, PROT, ALBUMIN in the last 168 hours. No results for input(s): LIPASE, AMYLASE in the last 168 hours. No results for input(s): AMMONIA in the last 168 hours. Coagulation Profile: No results for input(s): INR, PROTIME in the last 168 hours. Cardiac Enzymes: No results for input(s): CKTOTAL, CKMB, CKMBINDEX, TROPONINI in the last 168 hours. BNP (last 3 results) No results for input(s): PROBNP in the last 8760 hours. HbA1C: No results for input(s): HGBA1C in the last 72 hours. CBG: No results for input(s): GLUCAP in the last 168 hours. Lipid Profile: No results for input(s): [name], HDL, LDLCALC, TRIG, CHOLHDL, LDLDIRECT in the last 72 hours. Thyroid Function Tests: No results for input(s): TSH, T4TOTAL, FREET4, T3FREE, THYROIDAB in the last 72 hours. Anemia Panel: No results for input(s): VITAMINB12,  FOLATE, FERRITIN, TIBC, IRON, RETICCTPCT in the last 72 hours. Sepsis Labs: Recent Labs  Lab 11/19/17 1248  LATICACIDVEN 1.23    Recent Results (from the past 240 hour(s))  Blood culture (routine x 2)     Status: Abnormal   Collection Time: 11/19/17 12:30 PM  Result Value Ref Range Status   Specimen Description BLOOD LEFT ANTECUBITAL  Final   Special Requests   Final    BOTTLES DRAWN AEROBIC AND ANAEROBIC Blood Culture adequate volume   Culture  Setup Time   Final    GRAM POSITIVE COCCI IN CLUSTERS ANAEROBIC BOTTLE ONLY CRITICAL RESULT CALLED TO, READ BACK BY AND VERIFIED WITH: M MACCIA,PHARMD AT 1106 11/20/17 BY L BENFIELD    Culture (A)  Final    STAPHYLOCOCCUS SPECIES (COAGULASE NEGATIVE) THE SIGNIFICANCE OF ISOLATING THIS ORGANISM FROM A SINGLE SET OF BLOOD CULTURES WHEN MULTIPLE SETS ARE DRAWN IS UNCERTAIN. PLEASE NOTIFY [name]  WITHIN ONE WEEK IF SPECIATION AND SENSITIVITIES ARE REQUIRED. Performed at [facility], [place] [address], [place], [place] [address]    Report Status 11/22/2017 FINAL  Final  Blood Culture ID Panel (Reflexed)     Status: Abnormal   Collection Time: 11/19/17 12:30 PM  Result Value Ref Range Status   Enterococcus species NOT DETECTED NOT DETECTED Final   Listeria monocytogenes NOT DETECTED NOT DETECTED Final   Staphylococcus species DETECTED (A) NOT DETECTED Final    Comment: Methicillin (oxacillin) resistant coagulase negative staphylococcus. Possible blood culture contaminant (unless isolated from more than one blood culture draw or clinical case suggests pathogenicity). No antibiotic treatment is indicated for blood  culture contaminants. CRITICAL RESULT CALLED TO, READ BACK BY AND VERIFIED WITH: M MACCIA,PHARMD AT 1106 11/20/17 BY L BENFIELD    Staphylococcus aureus NOT DETECTED NOT DETECTED Final   Methicillin resistance DETECTED (A) NOT DETECTED Final    Comment: CRITICAL RESULT CALLED TO, READ BACK BY AND VERIFIED  WITH: M MACCIA,PHARMD AT 1106 11/20/17 BY L BENFIELD    Streptococcus species NOT DETECTED NOT DETECTED Final   Streptococcus agalactiae NOT DETECTED NOT DETECTED Final   Streptococcus pneumoniae NOT DETECTED NOT DETECTED Final   Streptococcus pyogenes NOT DETECTED NOT DETECTED Final   Acinetobacter baumannii NOT DETECTED NOT DETECTED Final   Enterobacteriaceae species NOT DETECTED NOT DETECTED Final   Enterobacter cloacae complex NOT DETECTED NOT DETECTED Final   Escherichia coli NOT DETECTED NOT DETECTED Final   Klebsiella oxytoca NOT DETECTED NOT DETECTED Final   Klebsiella pneumoniae NOT DETECTED NOT DETECTED Final   Proteus species NOT DETECTED NOT DETECTED Final   Serratia marcescens NOT DETECTED NOT DETECTED Final   Haemophilus influenzae NOT DETECTED NOT DETECTED Final   Neisseria meningitidis NOT DETECTED NOT DETECTED Final   Pseudomonas aeruginosa NOT DETECTED NOT DETECTED Final   Candida albicans NOT DETECTED NOT DETECTED Final   Candida glabrata NOT DETECTED NOT DETECTED Final   Candida krusei NOT DETECTED NOT DETECTED Final   Candida parapsilosis NOT DETECTED NOT DETECTED Final   Candida tropicalis NOT DETECTED NOT DETECTED Final    Comment: Performed at [facility], [address] [place], [place] [address]  Blood culture (routine x 2)     Status: None (Preliminary result)   Collection Time: 11/19/17 12:53 PM  Result Value Ref Range Status   Specimen Description BLOOD RIGHT ANTECUBITAL  Final   Special Requests   Final    BOTTLES DRAWN AEROBIC AND ANAEROBIC Blood Culture adequate volume   Culture   Final    NO GROWTH 2 DAYS Performed at [facility], [place] [address], [place], [place] [address]    Report Status PENDING  Incomplete  MRSA PCR Screening     Status: None   Collection Time: 11/20/17 12:44 PM  Result Value Ref Range Status   MRSA by PCR NEGATIVE NEGATIVE Final    Comment:        The GeneXpert MRSA Assay (FDA approved for NASAL  specimens only), is one component of a comprehensive MRSA colonization surveillance program. It is not intended to diagnose MRSA infection nor to guide or monitor treatment for MRSA infections. Performed at [facility], [place] [address] [place], [place] [address]        Radiology Studies: No results found.    Scheduled Meds: . amLODipine  5 mg Oral Daily  . aspirin EC  81 mg Oral QHS  . clonazePAM  1  mg Oral QHS  . donepezil  10 mg Oral Daily  . enoxaparin (LOVENOX) injection  40 mg Subcutaneous Q24H  . fluticasone  1 spray Each Nare BID  . irbesartan  150 mg Oral Daily  . [START ON 11/26/2017] levothyroxine  112 mcg Oral Once per day on Sun Sat  . levothyroxine  125 mcg Oral Once per day on Mon Tue Wed Thu Fri  . loratadine  10 mg Oral Daily  . oseltamivir  30 mg Oral BID  . oxybutynin  10 mg Oral Daily  . simvastatin  20 mg Oral QPM  . sodium chloride flush  3 mL Intravenous Q12H  . valACYclovir  500 mg Oral Daily   Continuous Infusions: . ceFEPime (MAXIPIME) IV Stopped (11/22/17 0453)     LOS: 3 days    Time spent: 30 minutes   Dessa Phi, DO [facility] [email or web address] Password [facility] 11/22/2017, 12:22 PM

## 2017-11-23 ENCOUNTER — Inpatient Hospital Stay (HOSPITAL_COMMUNITY): Payer: Medicare Other

## 2017-11-23 DIAGNOSIS — E039 Hypothyroidism, unspecified: Secondary | ICD-10-CM

## 2017-11-23 DIAGNOSIS — I10 Essential (primary) hypertension: Secondary | ICD-10-CM

## 2017-11-23 DIAGNOSIS — F0390 Unspecified dementia without behavioral disturbance: Secondary | ICD-10-CM

## 2017-11-23 DIAGNOSIS — Z8673 Personal history of transient ischemic attack (TIA), and cerebral infarction without residual deficits: Secondary | ICD-10-CM

## 2017-11-23 DIAGNOSIS — Z79899 Other long term (current) drug therapy: Secondary | ICD-10-CM

## 2017-11-23 DIAGNOSIS — J189 Pneumonia, unspecified organism: Secondary | ICD-10-CM

## 2017-11-23 DIAGNOSIS — J101 Influenza due to other identified influenza virus with other respiratory manifestations: Secondary | ICD-10-CM

## 2017-11-23 LAB — CBC
HCT: 38.2 % (ref 36.0–46.0)
Hemoglobin: 12.4 g/dL (ref 12.0–15.0)
MCH: 29.5 pg (ref 26.0–34.0)
MCHC: 32.5 g/dL (ref 30.0–36.0)
MCV: 91 fL (ref 78.0–100.0)
PLATELETS: 234 10*3/uL (ref 150–400)
RBC: 4.2 MIL/uL (ref 3.87–5.11)
RDW: 13.3 % (ref 11.5–15.5)
WBC: 6.2 10*3/uL (ref 4.0–10.5)

## 2017-11-23 LAB — BASIC METABOLIC PANEL
Anion gap: 13 (ref 5–15)
BUN: <5 mg/dL — ABNORMAL LOW (ref 6–20)
CALCIUM: 8.9 mg/dL (ref 8.9–10.3)
CO2: 26 mmol/L (ref 22–32)
CREATININE: 0.63 mg/dL (ref 0.44–1.00)
Chloride: 102 mmol/L (ref 101–111)
GFR calc non Af Amer: >60 mL/min (ref >60)
GLUCOSE: 97 mg/dL (ref 65–99)
Potassium: 3.6 mmol/L (ref 3.5–5.1)
Sodium: 141 mmol/L (ref 135–145)

## 2017-11-23 MED ORDER — GUAIFENESIN-DM 100-10 MG/5ML PO SYRP: 5.0000 mL | ORAL_SOLUTION | ORAL | 0 refills | Status: DC | PRN

## 2017-11-23 MED ORDER — AMOXICILLIN-POT CLAVULANATE 875-125 MG PO TABS: 1.0000 | ORAL_TABLET | Freq: Two times a day (BID) | ORAL | 0 refills | Status: AC

## 2017-11-23 MED ORDER — OSELTAMIVIR PHOSPHATE 30 MG PO CAPS: 30.0000 mg | ORAL_CAPSULE | Freq: Two times a day (BID) | ORAL | 0 refills | Status: AC

## 2017-11-23 NOTE — Progress Notes (Cosign Needed)
Patient was provided discharge instructions, including new medications, follow up appt, when to call doctor, and hand hygiene. Patient verbalized understanding and encouraged to call back if pt has any further questions. Patient escorted downstairs, safety transferred from wheelchair to vehicle with no complications.

## 2017-11-23 NOTE — Discharge Instructions (Signed)

## 2017-11-23 NOTE — Discharge Summary (Addendum)
Physician Discharge Summary  ALONNAH LAMPKINS HER:[id number] DOB: Sep 30, 1941 DOA: 11/19/2017  PCP: Hulan Fess, MD  Admit date: 11/19/2017 Discharge date: 11/23/2017  Admitted From: Home Disposition: Home  Recommendations for Outpatient Follow-up:  1. Follow up with PCP in 1 week 2. Please obtain BMP/CBC in one week 3. Recheck chest x-ray in 3-4 weeks 4. Please follow up on the following pending results: Blood culture  Home Health: None Equipment/Devices: None  Discharge Condition: Stable CODE STATUS: Full code Diet recommendation: Heart healthy   Brief/Interim Summary:  Admission HPI written by Erin Hearing, NP   Chief Complaint: Productive cough x 3 weeks  HPI: [name] is a 77 y.o. 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  Patient reports 2-3 weeks of productive cough with whitish to gray sputum and low-grade fevers at home.  She reports pleuritic chest pain with coughing.  Walking reproduces paroxysmal coughing episodes.  She has been taking OTC Delsym without relief.  In the ER she was not hypoxemic.  Initial chest x-ray unrevealing but CT of the chest with bilateral/basilar infiltrates consistent with pneumonia.  She is immunocompromised on chemotherapeutic agents to treat her smoldering multiple myeloma therefore she will be treated as HCAP.  Her primary complaint at this point is of hunger.  ED Course:  Vital Signs: BP (!) 124/95   Pulse 76   Temp 98.9 F (37.2 C) (Rectal)   Resp 18   SpO2 100%  CXR: Neg CT Chest: Bibasilar heterogeneous airspace opacification due to atelectasis or pneumonia--we have reviewed CT films and abnormalities appear more consistent with pneumonia Lab data: Sodium 141, potassium 3.6, chloride 104, CO2 24, glucose 106,  BUN 19, creatinine 0.77 anion gap 13, white count 3200 with neutrophils 57%, lymphocytes 30%, neutrophils 1.8%, lymphocytes 1%.  Hemoglobin 12.9, platelets 164,000 Medications and treatments: Albuterol neb 5 mg x1, Atrovent neb 0.5 mg x1, Tessalon Perle 100 mg x1, cefepime 1 g, Vancomycin 1 g IV x1    Hospital course:  HCAP Influenza Bilateral basilar infiltrates concerning for pneumonia vs atelectasis on CT scan. Patient influenza positive as well. Empirically treated with cefepime and vancomycin. Vancomycin discontinued. She received 5 days of cefepime with transition to Augmentin on discharge. She was started on Tamiflu for her influenza infection and will complete a 5 day course as an outpatient. Blood cultures significant for 1/2 samples with gram positive cocci, likely a contaminant. Second bottle negative. Recommend repeat chest x-ray in 3-4 weeks to ensure resolution. Patient on room air and baseline functionally per physical therapy evaluation. Close outpatient follow-up.  Smoldering multiple myeloma Currently on velcade and daratumumab, valtrex. Followed by Verner Chol with [facility] health in [place], however, she states she has no transferred care to [facility]  HTN Continued norvasc, avapro  Hx CVA Continued aspirin, statin  Hypothyroidism Continued synthroid  Mild dementia, anxiety disorder Continued aricept, xanax, klonopin   Discharge Diagnoses:  Principal Problem:   HCAP (healthcare-associated pneumonia) Active Problems:   Immunocompromised state due to drug therapy   History of CVA (cerebrovascular accident)   Hypertension   Hyperlipidemia   Asthma   Smoldering multiple myeloma (SMM) ([place])   Hypothyroidism, adult   Anxiety disorder   Mild dementia    Discharge Instructions  Discharge Instructions    Call MD for:  difficulty breathing, headache or visual disturbances  Complete by:  As directed    Call MD for:  extreme fatigue   Complete  by:  As directed    Call MD for:  severe uncontrolled pain   Complete by:  As directed    Call MD for:  temperature >100.4   Complete by:  As directed    Diet - low sodium heart healthy   Complete by:  As directed    Increase activity slowly   Complete by:  As directed      Allergies as of 11/23/2017      Reactions   Aldactone [spironolactone] Palpitations   Burning in stomach Burning in stomach Burning in stomach   Decadrol [dexamethasone] Anaphylaxis, Palpitations   Other reaction(s): Unknown   Doxycycline    SEVERE HEADACHES   Fluconazole Swelling   Facial swelling   Sertraline Hcl Palpitations   Hydrocodone Rash   Levofloxacin Hives   Leg Pain   Lipitor [atorvastatin] Other (See Comments), Hives   Patient stated legs hurt so bad she could hardly walk  Patient stated legs hurt so bad she could hardly walk    Losartan Swelling   Prednisolone Hives   Headache, left eye swelling, forehead swelling   Sertraline Anxiety, Rash   Ultram [tramadol] Rash   Headaches  Headaches  Headaches  Headaches    Levofloxacin Other (See Comments)   Severe Myalgias   Prednisone    Pseudoephedrine    stroke   Sertraline Hcl    Sudafed [pseudoephedrine Hcl]    Trazodone And Nefazodone    Side affects   Adhesive [tape] Rash   Hydrocodone-acetaminophen Anxiety   Latex Rash   Quinolones Anxiety      Medication List    STOP taking these medications   nystatin ointment Commonly known as:  MYCOSTATIN     TAKE these medications   acetaminophen 500 MG tablet Commonly known as:  TYLENOL Take 250-500 mg by mouth every 6 (six) hours as needed for mild pain, moderate pain, fever or headache.   amLODipine 5 MG tablet Commonly known as:  NORVASC Take 1 tablet (5 mg total) by mouth daily.   amoxicillin-clavulanate 875-125 MG tablet Commonly known as:  AUGMENTIN Take 1 tablet by mouth 2 (two) times daily for 3 days.   aspirin 81 MG tablet Chew 81 mg by mouth at bedtime.    azelastine 0.1 % nasal spray Commonly known as:  ASTELIN Place 1 spray into both nostrils daily as needed for allergies.   clonazePAM 1 MG tablet Commonly known as:  KLONOPIN Take 1 mg by mouth at bedtime.   donepezil 10 MG tablet Commonly known as:  ARICEPT Take 10 mg by mouth daily.   erythromycin ophthalmic ointment Place 1 application into the right eye at bedtime.   fexofenadine 180 MG tablet Commonly known as:  ALLEGRA Take 180 mg by mouth daily.   fluticasone 50 MCG/ACT nasal spray Commonly known as:  FLONASE Place 1 spray into both nostrils 2 (two) times daily.   guaiFENesin-dextromethorphan 100-10 MG/5ML syrup Commonly known as:  ROBITUSSIN DM Take 5 mLs by mouth every 4 (four) hours as needed for cough.   levothyroxine 112 MCG tablet Commonly known as:  SYNTHROID, LEVOTHROID Take 112 mcg by mouth See admin instructions. Take 1 tablet by mouth daily on weekends only.   levothyroxine 125 MCG tablet Commonly known as:  SYNTHROID, LEVOTHROID Take 125 mcg by mouth daily. Take 1 tablet daily during the week only.   loperamide 2 MG tablet  Commonly known as:  IMODIUM A-D Take 2-4 mg by mouth 4 (four) times daily as needed for diarrhea or loose stools.   MICARDIS 40 MG tablet Generic drug:  telmisartan Take 1 tablet (40 mg total) by mouth daily.   oseltamivir 30 MG capsule Commonly known as:  TAMIFLU Take 1 capsule (30 mg total) by mouth 2 (two) times daily for 2 days.   oxybutynin 10 MG 24 hr tablet Commonly known as:  DITROPAN-XL Take 10 mg by mouth daily.   potassium chloride 10 MEQ tablet Commonly known as:  K-DUR Take 10 mEq by mouth 2 (two) times daily.   PRESERVISION AREDS PO Take 1 capsule by mouth 2 (two) times a week.   REFRESH TEARS 0.5 % Soln Generic drug:  carboxymethylcellulose Place 1 drop into both eyes 3 (three) times daily as needed (for allergies).   simvastatin 20 MG tablet Commonly known as:  ZOCOR Take 1 tablet (20 mg total) by  mouth every evening.   SYSTANE 0.4-0.3 % Soln Generic drug:  Polyethyl Glycol-Propyl Glycol Place 1 drop into both eyes 2 (two) times daily as needed (for allergies).   traMADol 50 MG tablet Commonly known as:  ULTRAM Take 50 mg by mouth every 6 (six) hours as needed for moderate pain.   valACYclovir 500 MG tablet Commonly known as:  VALTREX Take 1 tablet by mouth daily.   VENTOLIN HFA 108 (90 Base) MCG/ACT inhaler Generic drug:  albuterol Inhale 2 puffs into the lungs every 6 (six) hours as needed for wheezing or shortness of breath.   XANAX 0.5 MG tablet Generic drug:  ALPRAZolam Take 0.5 mg by mouth 2 (two) times daily as needed for anxiety or sleep.      Follow-up Information    Little, Lennette Bihari, MD. Schedule an appointment as soon as possible for a visit in 1 week(s).   Specialty:  Family Medicine Why:  Pneumonia. Influenza. Contact information: [place] [address] [phone]          Allergies  Allergen Reactions  . Aldactone [Spironolactone] Palpitations    Burning in stomach Burning in stomach Burning in stomach  . Decadrol [Dexamethasone] Anaphylaxis and Palpitations    Other reaction(s): Unknown  . Doxycycline     SEVERE HEADACHES  . Fluconazole Swelling    Facial swelling  . Sertraline Hcl Palpitations  . Hydrocodone Rash  . Levofloxacin Hives    Leg Pain  . Lipitor [Atorvastatin] Other (See Comments) and Hives    Patient stated legs hurt so bad she could hardly walk  Patient stated legs hurt so bad she could hardly walk   . Losartan Swelling  . Prednisolone Hives    Headache, left eye swelling, forehead swelling  . Sertraline Anxiety and Rash  . Ultram [Tramadol] Rash    Headaches  Headaches  Headaches  Headaches   . Levofloxacin Other (See Comments)    Severe Myalgias  . Prednisone   . Pseudoephedrine     stroke  . Sertraline Hcl   . Sudafed [Pseudoephedrine Hcl]   . Trazodone And Nefazodone     Side affects   . Adhesive [Tape] Rash  . Hydrocodone-Acetaminophen Anxiety  . Latex Rash  . Quinolones Anxiety    Consultations:  None   Procedures/Studies: Dg Chest 2 View  Result Date: 11/19/2017 CLINICAL DATA:  Productive cough. EXAM: CHEST  2 VIEW COMPARISON:  05/20/2016 FINDINGS: AP and lateral views of the chest show hyperexpansion without focal airspace consolidation. The cardio  pericardial silhouette is enlarged. Right Port-A-Cath remains in place. Calcified granuloma noted right lung. Degenerative changes evident in each shoulder. Telemetry leads overlie the chest. IMPRESSION: Stable.  No acute findings. Electronically Signed   By: Misty Stanley M.D.   On: 11/19/2017 09:20   Ct Angio Chest Pe W And/or Wo Contrast  Result Date: 11/19/2017 CLINICAL DATA:  Productive cough 2-3 weeks. Some shortness-of-breath. EXAM: CT ANGIOGRAPHY CHEST WITH CONTRAST TECHNIQUE: Multidetector CT imaging of the chest was performed using the standard protocol during bolus administration of intravenous contrast. Multiplanar CT image reconstructions and MIPs were obtained to evaluate the vascular anatomy. CONTRAST:  80m ISOVUE-370 IOPAMIDOL (ISOVUE-370) INJECTION 76% COMPARISON:  CT chest 04/12/2012 and abdominal CT 05/21/2017 FINDINGS: Cardiovascular: Mild cardiomegaly. Mild calcified plaque over the left anterior descending and lateral circumflex coronary arteries. Pulmonary arteries are well opacified without evidence of emboli. Remaining vascular structures are within normal. Mediastinum/Nodes: No evidence of mediastinal or hilar adenopathy. Small amount of fluid adjacent the distal esophagus. Remaining mediastinal structures are within normal. Lungs/Pleura: Lungs are adequately inflated with mild bibasilar heterogeneous airspace opacification which may be due to atelectasis or infection. No evidence of effusion. Calcified granuloma over the right upper lobe. Upper Abdomen: Decreased size of hypodensity over the right lobe  of the liver measuring 3.6 cm (previous 6 cm) with central radiodensity compatible patient's previous liver biopsy and marker clip. Musculoskeletal: Degenerate change of the spine. Right chest Port-A-Cath. Review of the MIP images confirms the above findings. IMPRESSION: No evidence of pulmonary emboli. Bibasilar heterogeneous airspace opacification which may be due to atelectasis or pneumonia. Cardiomegaly and evidence of mild atherosclerotic coronary artery disease. Stable post biopsy change over the right liver. Electronically Signed   By: DMarin OlpM.D.   On: 11/19/2017 11:13   Dg Chest Port 1 View  Result Date: 11/23/2017 CLINICAL DATA:  Cough and pneumonia EXAM: PORTABLE CHEST 1 VIEW COMPARISON:  Chest CT from 4 days ago FINDINGS: Mild indistinct and streaky opacity at the bases. No change from prior. No pleural effusion or Kerley lines. Chronic cardiomegaly. Porta catheter on the right with tip at the upper cavoatrial junction. Advanced glenohumeral osteoarthritis. Degenerative thoracic spurring. IMPRESSION: History of pneumonia with stable atelectasis or consolidation at the bases when compared to chest CT 4 days prior. Electronically Signed   By: JMonte FantasiaM.D.   On: 11/23/2017 13:00     Subjective: Productive cough. Afebrile. Ambulated well with physical therapy yesterday.  Discharge Exam: Vitals:   11/22/17 2114 11/23/17 0508  BP: (!) 145/72 (!) 150/79  Pulse: 66 70  Resp: 17 16  Temp: 98.8 F (37.1 C) 97.8 F (36.6 C)  SpO2: 96% 94%   Vitals:   11/22/17 0442 11/22/17 1353 11/22/17 2114 11/23/17 0508  BP: (!) 158/68 (!) 151/76 (!) 145/72 (!) 150/79  Pulse: 68 77 66 70  Resp: _0 Temp: 99.4 F (37.4 C) 98.2 F (36.8 C) 98.8 F (37.1 C) 97.8 F (36.6 C)  TempSrc: Oral Oral Oral Oral  SpO2: 96% 95% 96% 94%    General: Pt is alert, awake, not in acute distress Cardiovascular: RRR, S1/S2 +, no rubs, no gallops Respiratory: CTA bilaterally, no wheezing,  no rhonchi. Diminished. Abdominal: Soft, NT, ND, bowel sounds + Extremities: no edema, no cyanosis    The results of significant diagnostics from this hospitalization (including imaging, microbiology, ancillary and laboratory) are listed below for reference.     Microbiology: Recent Results (from the past 240 hour(s))  Blood culture (routine x 2)     Status: Abnormal   Collection Time: 11/19/17 12:30 PM  Result Value Ref Range Status   Specimen Description BLOOD LEFT ANTECUBITAL  Final   Special Requests   Final    BOTTLES DRAWN AEROBIC AND ANAEROBIC Blood Culture adequate volume   Culture  Setup Time   Final    GRAM POSITIVE COCCI IN CLUSTERS ANAEROBIC BOTTLE ONLY CRITICAL RESULT CALLED TO, READ BACK BY AND VERIFIED WITH: M MACCIA,PHARMD AT 1106 11/20/17 BY L BENFIELD    Culture (A)  Final    STAPHYLOCOCCUS SPECIES (COAGULASE NEGATIVE) THE SIGNIFICANCE OF ISOLATING THIS ORGANISM FROM A SINGLE SET OF BLOOD CULTURES WHEN MULTIPLE SETS ARE DRAWN IS UNCERTAIN. PLEASE NOTIFY THE MICROBIOLOGY DEPARTMENT WITHIN ONE WEEK IF SPECIATION AND SENSITIVITIES ARE REQUIRED. Performed at [facility], [place] [address] [place], [place] [address]    Report Status 11/22/2017 FINAL  Final  Blood Culture ID Panel (Reflexed)     Status: Abnormal   Collection Time: 11/19/17 12:30 PM  Result Value Ref Range Status   Enterococcus species NOT DETECTED NOT DETECTED Final   Listeria monocytogenes NOT DETECTED NOT DETECTED Final   Staphylococcus species DETECTED (A) NOT DETECTED Final    Comment: Methicillin (oxacillin) resistant coagulase negative staphylococcus. Possible blood culture contaminant (unless isolated from more than one blood culture draw or clinical case suggests pathogenicity). No antibiotic treatment is indicated for blood  culture contaminants. CRITICAL RESULT CALLED TO, READ BACK BY AND VERIFIED WITH: M MACCIA,PHARMD AT 1106 11/20/17 BY L BENFIELD    Staphylococcus aureus NOT DETECTED  NOT DETECTED Final   Methicillin resistance DETECTED (A) NOT DETECTED Final    Comment: CRITICAL RESULT CALLED TO, READ BACK BY AND VERIFIED WITH: M MACCIA,PHARMD AT 1106 11/20/17 BY L BENFIELD    Streptococcus species NOT DETECTED NOT DETECTED Final   Streptococcus agalactiae NOT DETECTED NOT DETECTED Final   Streptococcus pneumoniae NOT DETECTED NOT DETECTED Final   Streptococcus pyogenes NOT DETECTED NOT DETECTED Final   Acinetobacter baumannii NOT DETECTED NOT DETECTED Final   Enterobacteriaceae species NOT DETECTED NOT DETECTED Final   Enterobacter cloacae complex NOT DETECTED NOT DETECTED Final   Escherichia coli NOT DETECTED NOT DETECTED Final   Klebsiella oxytoca NOT DETECTED NOT DETECTED Final   Klebsiella pneumoniae NOT DETECTED NOT DETECTED Final   Proteus species NOT DETECTED NOT DETECTED Final   Serratia marcescens NOT DETECTED NOT DETECTED Final   Haemophilus influenzae NOT DETECTED NOT DETECTED Final   Neisseria meningitidis NOT DETECTED NOT DETECTED Final   Pseudomonas aeruginosa NOT DETECTED NOT DETECTED Final   Candida albicans NOT DETECTED NOT DETECTED Final   Candida glabrata NOT DETECTED NOT DETECTED Final   Candida krusei NOT DETECTED NOT DETECTED Final   Candida parapsilosis NOT DETECTED NOT DETECTED Final   Candida tropicalis NOT DETECTED NOT DETECTED Final    Comment: Performed at [facility], [address] [place], [place] [address]  Blood culture (routine x 2)     Status: None (Preliminary result)   Collection Time: 11/19/17 12:53 PM  Result Value Ref Range Status   Specimen Description BLOOD RIGHT ANTECUBITAL  Final   Special Requests   Final    BOTTLES DRAWN AEROBIC AND ANAEROBIC Blood Culture adequate volume   Culture   Final    NO GROWTH 4 DAYS Performed at [facility], [place] [address], [place], [place] [address]    Report Status PENDING  Incomplete  MRSA  PCR Screening     Status: None   Collection Time: 11/20/17 12:44 PM  Result  Value Ref Range Status   MRSA by PCR NEGATIVE NEGATIVE Final    Comment:        The GeneXpert MRSA Assay (FDA approved for NASAL specimens only), is one component of a comprehensive MRSA colonization surveillance program. It is not intended to diagnose MRSA infection nor to guide or monitor treatment for MRSA infections. Performed at [facility], [place] [address], [place], [place] [address]      Labs: BNP (last 3 results) No results for input(s): BNP in the last 8760 hours. Basic Metabolic Panel: Recent Labs  Lab 11/19/17 0850 11/20/17 0356 11/21/17 0309 11/22/17 0503 11/23/17 0436  NA 141 140 140 141 141  K 3.6 3.5 3.9 3.7 3.6  CL 104 106 107 105 102  CO2 _0 GLUCOSE 106* 98 93 94 97  BUN _1 5* <5*  CREATININE 0.77 0.65 0.70 0.67 0.63  CALCIUM 9.3 8.5* 8.6* 8.8* 8.9   Liver Function Tests: No results for input(s): AST, ALT, ALKPHOS, BILITOT, PROT, ALBUMIN in the last 168 hours. No results for input(s): LIPASE, AMYLASE in the last 168 hours. No results for input(s): AMMONIA in the last 168 hours. CBC: Recent Labs  Lab 11/19/17 0850 11/20/17 0356 11/21/17 0309 11/22/17 0503 11/23/17 0436  WBC 3.2* 4.4 5.6 4.9 6.2  NEUTROABS 1.8  --   --   --   --   HGB 12.9 12.0 11.7* 11.7* 12.4  HCT 38.6 37.0 35.4* 36.0 38.2  MCV 90.4 90.2 92.2 92.3 91.0  PLT 164 170 185 204 234   Cardiac Enzymes: No results for input(s): CKTOTAL, CKMB, CKMBINDEX, TROPONINI in the last 168 hours. BNP: Invalid input(s): POCBNP CBG: No results for input(s): GLUCAP in the last 168 hours. D-Dimer No results for input(s): DDIMER in the last 72 hours. Hgb A1c No results for input(s): HGBA1C in the last 72 hours. Lipid Profile No results for input(s): CHOL, HDL, LDLCALC, TRIG, CHOLHDL, LDLDIRECT in the last 72 hours. Thyroid function studies No results for input(s): TSH, T4TOTAL, T3FREE, THYROIDAB in the last 72 hours.  Invalid input(s): FREET3 Anemia work up No  results for input(s): VITAMINB12, FOLATE, FERRITIN, TIBC, IRON, RETICCTPCT in the last 72 hours. Urinalysis    Component Value Date/Time   COLORURINE YELLOW 02/13/2013 2029   APPEARANCEUR CLOUDY (A) 02/13/2013 2029   LABSPEC 1.007 02/13/2013 2029   PHURINE 7.5 02/13/2013 2029   GLUCOSEU NEGATIVE 02/13/2013 2029   HGBUR SMALL (A) 02/13/2013 2029   BILIRUBINUR NEGATIVE 02/13/2013 2029   KETONESUR NEGATIVE 02/13/2013 2029   PROTEINUR NEGATIVE 02/13/2013 2029   UROBILINOGEN 0.2 02/13/2013 2029   NITRITE NEGATIVE 02/13/2013 2029   LEUKOCYTESUR NEGATIVE 02/13/2013 2029   Sepsis Labs Invalid input(s): PROCALCITONIN,  WBC,  LACTICIDVEN Microbiology Recent Results (from the past 240 hour(s))  Blood culture (routine x 2)     Status: Abnormal   Collection Time: 11/19/17 12:30 PM  Result Value Ref Range Status   Specimen Description BLOOD LEFT ANTECUBITAL  Final   Special Requests   Final    BOTTLES DRAWN AEROBIC AND ANAEROBIC Blood Culture adequate volume   Culture  Setup Time   Final    GRAM POSITIVE COCCI IN CLUSTERS ANAEROBIC BOTTLE ONLY CRITICAL RESULT CALLED TO, READ BACK BY AND VERIFIED WITH: M MACCIA,PHARMD AT 1106 11/20/17 BY L BENFIELD    Culture (A)  Final  STAPHYLOCOCCUS SPECIES (COAGULASE NEGATIVE) THE SIGNIFICANCE OF ISOLATING THIS ORGANISM FROM A SINGLE SET OF BLOOD CULTURES WHEN MULTIPLE SETS ARE DRAWN IS UNCERTAIN. PLEASE NOTIFY THE MICROBIOLOGY DEPARTMENT WITHIN ONE WEEK IF SPECIATION AND SENSITIVITIES ARE REQUIRED. Performed at [facility], [place] [address], [place], [place] [address]    Report Status 11/22/2017 FINAL  Final  Blood Culture ID Panel (Reflexed)     Status: Abnormal   Collection Time: 11/19/17 12:30 PM  Result Value Ref Range Status   Enterococcus species NOT DETECTED NOT DETECTED Final   Listeria monocytogenes NOT DETECTED NOT DETECTED Final   Staphylococcus species DETECTED (A) NOT DETECTED Final    Comment: Methicillin (oxacillin) resistant  coagulase negative staphylococcus. Possible blood culture contaminant (unless isolated from more than one blood culture draw or clinical case suggests pathogenicity). No antibiotic treatment is indicated for blood  culture contaminants. CRITICAL RESULT CALLED TO, READ BACK BY AND VERIFIED WITH: M MACCIA,PHARMD AT 1106 11/20/17 BY L BENFIELD    Staphylococcus aureus NOT DETECTED NOT DETECTED Final   Methicillin resistance DETECTED (A) NOT DETECTED Final    Comment: CRITICAL RESULT CALLED TO, READ BACK BY AND VERIFIED WITH: M MACCIA,PHARMD AT 1106 11/20/17 BY L BENFIELD    Streptococcus species NOT DETECTED NOT DETECTED Final   Streptococcus agalactiae NOT DETECTED NOT DETECTED Final   Streptococcus pneumoniae NOT DETECTED NOT DETECTED Final   Streptococcus pyogenes NOT DETECTED NOT DETECTED Final   Acinetobacter baumannii NOT DETECTED NOT DETECTED Final   Enterobacteriaceae species NOT DETECTED NOT DETECTED Final   Enterobacter cloacae complex NOT DETECTED NOT DETECTED Final   Escherichia coli NOT DETECTED NOT DETECTED Final   Klebsiella oxytoca NOT DETECTED NOT DETECTED Final   Klebsiella pneumoniae NOT DETECTED NOT DETECTED Final   Proteus species NOT DETECTED NOT DETECTED Final   Serratia marcescens NOT DETECTED NOT DETECTED Final   Haemophilus influenzae NOT DETECTED NOT DETECTED Final   Neisseria meningitidis NOT DETECTED NOT DETECTED Final   Pseudomonas aeruginosa NOT DETECTED NOT DETECTED Final   Candida albicans NOT DETECTED NOT DETECTED Final   Candida glabrata NOT DETECTED NOT DETECTED Final   Candida krusei NOT DETECTED NOT DETECTED Final   Candida parapsilosis NOT DETECTED NOT DETECTED Final   Candida tropicalis NOT DETECTED NOT DETECTED Final    Comment: Performed at [facility], [address], [place], [place] [address]  Blood culture (routine x 2)     Status: None (Preliminary result)   Collection Time: 11/19/17 12:53 PM  Result Value Ref Range Status   Specimen  Description BLOOD RIGHT ANTECUBITAL  Final   Special Requests   Final    BOTTLES DRAWN AEROBIC AND ANAEROBIC Blood Culture adequate volume   Culture   Final    NO GROWTH 4 DAYS Performed at [facility], [place] [address], [place], [place] [address]    Report Status PENDING  Incomplete  MRSA PCR Screening     Status: None   Collection Time: 11/20/17 12:44 PM  Result Value Ref Range Status   MRSA by PCR NEGATIVE NEGATIVE Final    Comment:        The GeneXpert MRSA Assay (FDA approved for NASAL specimens only), is one component of a comprehensive MRSA colonization surveillance program. It is not intended to diagnose MRSA infection nor to guide or monitor treatment for MRSA infections. Performed at [facility], [place] [address] [place], [place] [address]      SIGNED:   Deidre Ala  Lonny Prude, MD [facility] 11/23/2017, 3:21 PM Pager [phone]  If 7PM-7AM, please contact night-coverage [email or web address] Password [email or web address]

## 2017-11-24 ENCOUNTER — Encounter: Payer: Self-pay | Admitting: Internal Medicine

## 2017-11-24 LAB — CULTURE, BLOOD (ROUTINE X 2)
CULTURE: NO GROWTH
Special Requests: ADEQUATE

## 2017-11-25 ENCOUNTER — Other Ambulatory Visit: Payer: Self-pay | Admitting: *Deleted

## 2017-11-25 ENCOUNTER — Other Ambulatory Visit: Payer: Medicare Other

## 2017-11-25 NOTE — Consult Note (Signed)
            [facility] CM Primary Care Navigator  11/25/2017  [name] [birth year] [id number]   Went to seepatient at the bedside to identify possible discharge needsbutshe was already discharged home per staff report.  Per MD note, patient was seen and treated for persistent productive cough and low-grade fevers at home. CT of the chest consistent with pneumonia.  Primary care provider's office is listed as providing transition of care (TOC).  Patient has discharge instruction to follow-up with primary care provider as soon as a possible for a visit in 1 week.   For additional questions please contact:  Edwena Felty A. Erisa Mehlman, BSN, RN-BC [facility] PRIMARY CARE Navigator Cell: [phone]

## 2017-11-25 NOTE — Patient Outreach (Signed)
Acute home visit requested by primary care provider.    MMSE 28/30  Clock drawing - not accurate  Name animals in 30 secs 11

## 2017-11-28 ENCOUNTER — Encounter: Payer: Self-pay | Admitting: *Deleted

## 2017-11-28 ENCOUNTER — Other Ambulatory Visit: Payer: Self-pay | Admitting: *Deleted
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
## 2017-11-29 DIAGNOSIS — C9000 Multiple myeloma not having achieved remission: Secondary | ICD-10-CM | POA: Diagnosis not present

## 2017-11-29 DIAGNOSIS — F411 Generalized anxiety disorder: Secondary | ICD-10-CM | POA: Diagnosis not present

## 2017-11-29 DIAGNOSIS — T7691XA Unspecified adult maltreatment, suspected, initial encounter: Secondary | ICD-10-CM | POA: Diagnosis not present

## 2017-11-29 DIAGNOSIS — J189 Pneumonia, unspecified organism: Secondary | ICD-10-CM | POA: Diagnosis not present

## 2017-11-29 DIAGNOSIS — I10 Essential (primary) hypertension: Secondary | ICD-10-CM | POA: Diagnosis not present

## 2017-11-29 DIAGNOSIS — E782 Mixed hyperlipidemia: Secondary | ICD-10-CM | POA: Diagnosis not present

## 2017-11-29 DIAGNOSIS — G3184 Mild cognitive impairment, so stated: Secondary | ICD-10-CM | POA: Diagnosis not present

## 2017-11-30 ENCOUNTER — Ambulatory Visit: Payer: Medicare Other | Admitting: Hematology

## 2017-12-01 ENCOUNTER — Ambulatory Visit (INDEPENDENT_AMBULATORY_CARE_PROVIDER_SITE_OTHER): Payer: Medicare Other | Admitting: Internal Medicine

## 2017-12-01 ENCOUNTER — Encounter: Payer: Self-pay | Admitting: Internal Medicine

## 2017-12-01 VITALS — BP 134/88 | HR 77 | Ht 61.0 in | Wt 125.8 lb

## 2017-12-01 DIAGNOSIS — I119 Hypertensive heart disease without heart failure: Secondary | ICD-10-CM | POA: Diagnosis not present

## 2017-12-01 DIAGNOSIS — J189 Pneumonia, unspecified organism: Secondary | ICD-10-CM | POA: Diagnosis not present

## 2017-12-01 DIAGNOSIS — E782 Mixed hyperlipidemia: Secondary | ICD-10-CM

## 2017-12-01 NOTE — Patient Instructions (Signed)
Medication Instructions:  Your physician recommends that you continue on your current medications as directed. Please refer to the Current Medication list given to you today.   Labwork: none  Testing/Procedures: none  Follow-Up: Your physician recommends that you schedule a follow-up appointment in: June, 2019 with Dr. Harrington Challenger.    Any Other Special Instructions Will Be Listed Below (If Applicable).     If you need a refill on your cardiac medications before your next appointment, please call your pharmacy.
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
## 2017-12-02 ENCOUNTER — Ambulatory Visit: Payer: Self-pay | Admitting: *Deleted

## 2017-12-07 ENCOUNTER — Ambulatory Visit: Payer: Medicare Other | Admitting: Nurse Practitioner

## 2017-12-08 DIAGNOSIS — N182 Chronic kidney disease, stage 2 (mild): Secondary | ICD-10-CM | POA: Diagnosis not present

## 2017-12-14 ENCOUNTER — Telehealth: Payer: Self-pay

## 2017-12-14 NOTE — Telephone Encounter (Signed)
Pt called yesterday wondering if it was okay for her to come to her appt tomorrow with Dr. Irene Limbo. Discussed with MD that pt has recently had pneumonia twice and the flu. Pt seems concerned about the potential to get sick again. Called back today and confirmed her appt. This RN told pt that would confirm there are masks at the front desk for her to wear upon entrance to the [facility] for protection. Pt verbalized understanding and thanks. Spoke with front desk attendant and found box of masks on the desk. According to staff member, masks are always at the front desk and registration for pt and family use.
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
## 2017-12-15 ENCOUNTER — Encounter: Payer: Self-pay | Admitting: Hematology

## 2017-12-15 ENCOUNTER — Inpatient Hospital Stay: Payer: Medicare Other | Attending: Hematology | Admitting: Hematology

## 2017-12-15 ENCOUNTER — Inpatient Hospital Stay: Payer: Medicare Other

## 2017-12-15 ENCOUNTER — Other Ambulatory Visit: Payer: Self-pay | Admitting: *Deleted

## 2017-12-15 ENCOUNTER — Encounter: Payer: Self-pay | Admitting: General Practice

## 2017-12-15 VITALS — BP 174/101 | HR 70 | Temp 98.5°F | Resp 18 | Ht 61.0 in | Wt 127.2 lb

## 2017-12-15 DIAGNOSIS — C9000 Multiple myeloma not having achieved remission: Secondary | ICD-10-CM

## 2017-12-15 DIAGNOSIS — Z961 Presence of intraocular lens: Secondary | ICD-10-CM | POA: Insufficient documentation

## 2017-12-15 DIAGNOSIS — Z79899 Other long term (current) drug therapy: Secondary | ICD-10-CM | POA: Diagnosis not present

## 2017-12-15 DIAGNOSIS — R4182 Altered mental status, unspecified: Secondary | ICD-10-CM

## 2017-12-15 DIAGNOSIS — Z85850 Personal history of malignant neoplasm of thyroid: Secondary | ICD-10-CM | POA: Diagnosis not present

## 2017-12-15 DIAGNOSIS — Z85820 Personal history of malignant melanoma of skin: Secondary | ICD-10-CM | POA: Diagnosis not present

## 2017-12-15 DIAGNOSIS — C6941 Malignant neoplasm of right ciliary body: Secondary | ICD-10-CM

## 2017-12-15 DIAGNOSIS — Z85840 Personal history of malignant neoplasm of eye: Secondary | ICD-10-CM | POA: Diagnosis not present

## 2017-12-15 MED ORDER — SODIUM CHLORIDE 0.9% FLUSH
10.0000 mL | Freq: Once | INTRAVENOUS | Status: AC
  Filled 2017-12-15: qty 10

## 2017-12-15 MED ORDER — HEPARIN SOD (PORK) LOCK FLUSH 100 UNIT/ML IV SOLN
500.0000 [IU] | Freq: Once | INTRAVENOUS | Status: AC
  Filled 2017-12-15 (×3): qty 5

## 2017-12-15 NOTE — Patient Outreach (Signed)
Called pt to follow up and request home visit.   I have talked with Samatha and Edwyna Shell from the [facility] center with concerns about pt safety I advised I have tried to schedule 2 follow up visits with Mrs. Felmlee but she has declined my request with excuses.   Today, pt stated, she is "just too tired to have me come visit, she has been out all day!" "I've been checked enough for one day." "I don't need you today." She also said that she forgot and left the cancer center without getting her blood work, she thinks! She said I don't have a band aid on and I don't remember the "ouch"  She said she would call to find out when she can come back for the lab work. She did agree to allow me to call tomorrow. I am going to call and leave a message with her son that we need a meeting soon with his mother, asap.  Pt did consent to me calling her again tomorrow.  I did call her son, Georgeana Oertel, and requested a return call.  Eulah Pont. Myrtie Neither, MSN, [facility] Gerontological Nurse Practitioner [facility] Care Management [phone]
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
## 2017-12-16 ENCOUNTER — Telehealth: Payer: Self-pay | Admitting: Hematology

## 2017-12-16 ENCOUNTER — Inpatient Hospital Stay: Payer: Medicare Other

## 2017-12-16 ENCOUNTER — Telehealth: Payer: Self-pay

## 2017-12-16 ENCOUNTER — Other Ambulatory Visit: Payer: Self-pay

## 2017-12-16 DIAGNOSIS — C9000 Multiple myeloma not having achieved remission: Secondary | ICD-10-CM | POA: Diagnosis not present

## 2017-12-16 DIAGNOSIS — C73 Malignant neoplasm of thyroid gland: Secondary | ICD-10-CM

## 2017-12-16 DIAGNOSIS — Z961 Presence of intraocular lens: Secondary | ICD-10-CM | POA: Diagnosis not present

## 2017-12-16 DIAGNOSIS — Z95828 Presence of other vascular implants and grafts: Secondary | ICD-10-CM | POA: Insufficient documentation

## 2017-12-16 DIAGNOSIS — Z85820 Personal history of malignant melanoma of skin: Secondary | ICD-10-CM | POA: Diagnosis not present

## 2017-12-16 DIAGNOSIS — Z85850 Personal history of malignant neoplasm of thyroid: Secondary | ICD-10-CM | POA: Diagnosis not present

## 2017-12-16 DIAGNOSIS — Z85840 Personal history of malignant neoplasm of eye: Secondary | ICD-10-CM | POA: Diagnosis not present

## 2017-12-16 DIAGNOSIS — Z79899 Other long term (current) drug therapy: Secondary | ICD-10-CM | POA: Diagnosis not present

## 2017-12-16 LAB — CBC WITH DIFFERENTIAL (CANCER CENTER ONLY)
BASOS ABS: 0 10*3/uL (ref 0.0–0.1)
BASOS PCT: 0 %
EOS ABS: 0.1 10*3/uL (ref 0.0–0.5)
Eosinophils Relative: 1 %
HCT: 36.5 % (ref 34.8–46.6)
Hemoglobin: 12.2 g/dL (ref 11.6–15.9)
Lymphocytes Relative: 34 %
Lymphs Abs: 1.5 10*3/uL (ref 0.9–3.3)
MCH: 31.1 pg (ref 25.1–34.0)
MCHC: 33.4 g/dL (ref 31.5–36.0)
MCV: 93.1 fL (ref 79.5–101.0)
MONO ABS: 0.4 10*3/uL (ref 0.1–0.9)
Monocytes Relative: 9 %
Neutro Abs: 2.5 10*3/uL (ref 1.5–6.5)
Neutrophils Relative %: 56 %
PLATELETS: 170 10*3/uL (ref 145–400)
RBC: 3.92 MIL/uL (ref 3.70–5.45)
RDW: 13.8 % (ref 11.2–14.5)
WBC: 4.5 10*3/uL (ref 3.9–10.3)

## 2017-12-16 LAB — CMP (CANCER CENTER ONLY)
ALK PHOS: 35 U/L — AB (ref 40–150)
ALT: 10 U/L (ref 0–55)
AST: 14 U/L (ref 5–34)
Albumin: 3.6 g/dL (ref 3.5–5.0)
Anion gap: 8 (ref 3–11)
BILIRUBIN TOTAL: 1.1 mg/dL (ref 0.2–1.2)
BUN: 10 mg/dL (ref 7–26)
CALCIUM: 9.7 mg/dL (ref 8.4–10.4)
CO2: 27 mmol/L (ref 22–29)
Chloride: 107 mmol/L (ref 98–109)
Creatinine: 0.72 mg/dL (ref 0.60–1.10)
GFR, Est AFR Am: >60 mL/min (ref >60)
Glucose, Bld: 88 mg/dL (ref 70–140)
POTASSIUM: 3.7 mmol/L (ref 3.5–5.1)
Sodium: 142 mmol/L (ref 136–145)
TOTAL PROTEIN: 7.2 g/dL (ref 6.4–8.3)

## 2017-12-16 LAB — LACTATE DEHYDROGENASE: LDH: 152 U/L (ref 125–245)

## 2017-12-16 LAB — SEDIMENTATION RATE: SED RATE: 14 mm/h (ref 0–22)

## 2017-12-16 MED ORDER — SODIUM CHLORIDE 0.9% FLUSH
10.0000 mL | Freq: Once | INTRAVENOUS | Status: AC
  Administered 2017-12-16: 10 mL
  Filled 2017-12-16: qty 10

## 2017-12-16 MED ORDER — HEPARIN SOD (PORK) LOCK FLUSH 100 UNIT/ML IV SOLN
500.0000 [IU] | Freq: Once | INTRAVENOUS | Status: AC
  Administered 2017-12-16: 500 [IU]
  Filled 2017-12-16: qty 5
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
## 2017-12-16 NOTE — Telephone Encounter (Signed)
Patient missed appointments 3/14. Patient scheduled for Nutritionist appointment per 3/15 sch message.

## 2017-12-17 LAB — BETA 2 MICROGLOBULIN, SERUM: BETA 2 MICROGLOBULIN: 2.3 mg/L (ref 0.6–2.4)

## 2017-12-19 LAB — KAPPA/LAMBDA LIGHT CHAINS
KAPPA FREE LGHT CHN: 2.8 mg/L — AB (ref 3.3–19.4)
Kappa, lambda light chain ratio: 0.01 — ABNORMAL LOW (ref 0.26–1.65)
Lambda free light chains: 305.7 mg/L — ABNORMAL HIGH (ref 5.7–26.3)

## 2017-12-20 ENCOUNTER — Encounter: Payer: Self-pay | Admitting: Hematology

## 2017-12-20 ENCOUNTER — Encounter: Payer: Medicare Other | Admitting: Nutrition

## 2017-12-20 DIAGNOSIS — Z7189 Other specified counseling: Secondary | ICD-10-CM | POA: Insufficient documentation

## 2017-12-20 DIAGNOSIS — C9000 Multiple myeloma not having achieved remission: Secondary | ICD-10-CM | POA: Insufficient documentation

## 2017-12-20 NOTE — Progress Notes (Signed)
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

## 2017-12-21 ENCOUNTER — Other Ambulatory Visit: Payer: Self-pay | Admitting: *Deleted

## 2017-12-21 NOTE — Patient Outreach (Addendum)
Acute home visit to follow up on pt condition and see if I she would allow me to see all her medications.  BP (!) 162/80 (BP Location: Left Arm, Patient Position: Sitting, Cuff Size: Normal)   Pulse 64   Resp 18    She is feeling better. Tomorrow she will have an MRI. She is established with [facility].  We discovered that after she got all her meds out that she does not have her amlodipine. We called her pharmacy and reordered this.  We talked about how she some times feels unsafe at home. She admitted this and says she has started thinking about moving to were she could have assistance. She mentioned [employer] but that neighborhood does not have any assistance.   I will provide more information for her on my next visit and keep encouraging her to consider this option.  I have called Jalen Oberry her son on several occasions and left messages to return my call and I have not heard from him. I will continue to reach out..  I will also keep encouraging her to use a med box and will make an easy to follow med list with med name, dose, appearance and time.  I will see her again in April.   Eulah Pont. Myrtie Neither, MSN, [facility] Gerontological Nurse Practitioner [facility] Care Management [phone]

## 2017-12-21 NOTE — Patient Outreach (Signed)
Tried to reach pt again to request follow up in home visit. She did not answer either her home of cell phone. I have called her on several occasions and she either does not answer or tell me she can't see me. I have left another message today on both her home and cell phones.  Eulah Pont. Myrtie Neither, MSN, [facility] Gerontological Nurse Practitioner [facility] Care Management [phone]

## 2017-12-22 ENCOUNTER — Ambulatory Visit (HOSPITAL_COMMUNITY)
Admission: RE | Admit: 2017-12-22 | Discharge: 2017-12-22 | Disposition: A | Discharge status: Home / Self Care | Payer: Medicare Other | Source: Ambulatory Visit | Attending: Hematology | Admitting: Hematology

## 2017-12-22 DIAGNOSIS — C9000 Multiple myeloma not having achieved remission: Secondary | ICD-10-CM | POA: Insufficient documentation

## 2017-12-22 DIAGNOSIS — R918 Other nonspecific abnormal finding of lung field: Secondary | ICD-10-CM | POA: Diagnosis not present

## 2017-12-22 DIAGNOSIS — I700 Atherosclerosis of aorta: Secondary | ICD-10-CM | POA: Diagnosis not present

## 2017-12-22 LAB — GLUCOSE, CAPILLARY: Glucose-Capillary: 103 mg/dL — ABNORMAL HIGH (ref 65–99)

## 2017-12-22 MED ORDER — FLUDEOXYGLUCOSE F - 18 (FDG) INJECTION
6.3600 | Freq: Once | INTRAVENOUS | Status: AC | PRN
  Administered 2017-12-22: 6.36 via INTRAVENOUS

## 2017-12-28 DIAGNOSIS — F411 Generalized anxiety disorder: Secondary | ICD-10-CM | POA: Diagnosis not present

## 2017-12-28 DIAGNOSIS — C9000 Multiple myeloma not having achieved remission: Secondary | ICD-10-CM | POA: Diagnosis not present

## 2017-12-28 DIAGNOSIS — G3184 Mild cognitive impairment, so stated: Secondary | ICD-10-CM | POA: Diagnosis not present

## 2017-12-28 DIAGNOSIS — J189 Pneumonia, unspecified organism: Secondary | ICD-10-CM | POA: Diagnosis not present
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
## 2017-12-29 ENCOUNTER — Encounter: Payer: Self-pay | Admitting: Nutrition

## 2017-12-29 ENCOUNTER — Inpatient Hospital Stay: Payer: Medicare Other | Admitting: Nutrition

## 2017-12-29 ENCOUNTER — Telehealth: Payer: Self-pay

## 2017-12-29 ENCOUNTER — Inpatient Hospital Stay (HOSPITAL_BASED_OUTPATIENT_CLINIC_OR_DEPARTMENT_OTHER): Payer: Medicare Other | Admitting: Hematology

## 2017-12-29 VITALS — BP 136/87 | HR 81 | Temp 97.9°F | Resp 20 | Ht 61.0 in | Wt 128.3 lb

## 2017-12-29 DIAGNOSIS — Z79899 Other long term (current) drug therapy: Secondary | ICD-10-CM

## 2017-12-29 DIAGNOSIS — Z85850 Personal history of malignant neoplasm of thyroid: Secondary | ICD-10-CM | POA: Diagnosis not present

## 2017-12-29 DIAGNOSIS — C9000 Multiple myeloma not having achieved remission: Secondary | ICD-10-CM

## 2017-12-29 DIAGNOSIS — Z961 Presence of intraocular lens: Secondary | ICD-10-CM

## 2017-12-29 DIAGNOSIS — Z85820 Personal history of malignant melanoma of skin: Secondary | ICD-10-CM

## 2017-12-29 DIAGNOSIS — Z7189 Other specified counseling: Secondary | ICD-10-CM

## 2017-12-29 DIAGNOSIS — Z85840 Personal history of malignant neoplasm of eye: Secondary | ICD-10-CM

## 2017-12-29 MED ORDER — ACYCLOVIR 400 MG PO TABS: 400.0000 mg | ORAL_TABLET | Freq: Two times a day (BID) | ORAL | 11 refills | Status: DC

## 2017-12-29 MED ORDER — PROCHLORPERAZINE MALEATE 10 MG PO TABS: 10.0000 mg | ORAL_TABLET | Freq: Four times a day (QID) | ORAL | 1 refills | Status: DC | PRN

## 2017-12-29 MED ORDER — LIDOCAINE-PRILOCAINE 2.5-2.5 % EX CREA
TOPICAL_CREAM | CUTANEOUS | 3 refills | Status: DC
Start: 2017-12-29 — End: 2018-07-03

## 2017-12-29 MED ORDER — ONDANSETRON HCL 4 MG PO TABS: 4.0000 mg | ORAL_TABLET | Freq: Two times a day (BID) | ORAL | 0 refills | Status: DC | PRN

## 2017-12-29 NOTE — Progress Notes (Signed)
Patient did not show up for nutrition appointment. 

## 2017-12-30 ENCOUNTER — Telehealth: Payer: Self-pay | Admitting: Hematology

## 2017-12-30 ENCOUNTER — Telehealth: Payer: Self-pay

## 2017-12-30 ENCOUNTER — Telehealth: Payer: Self-pay | Admitting: *Deleted

## 2017-12-30 NOTE — Telephone Encounter (Signed)
Printed calender of additional appointments added to previous schedule, and mailed to patient. Per 3/28 completing DAR orders

## 2017-12-30 NOTE — Telephone Encounter (Signed)
Scheduled appt per 3/29 sch msg - spoke with patient regarding appts. Patient has come multiple times in the last week to work on appointments because she keeps forgetting her appts. I rescheduled her appts and she wrote them on her calendar.

## 2017-12-30 NOTE — Telephone Encounter (Signed)
Pt called this morning questioning when her appointments would be. She said, "I completely forgot about my nutrition appt yesterday with Pamala Hurry. I must have gotten so anxious when Dr. Irene Limbo told me that I was going to have to be stuck with a needle that I just went straight to my car after the appointment with him." Pt requesting appt date and time for treatment. Explained that she would be having labs at 0800 on 4/5 followed by flush at 0815 and infusion at 0900. Pt said, "Oh no, a flush, is that where they are going to stick me with a needle. That hurt so bad last time. I'm not used to them not spraying it with lidocaine to help numb." Asked the pt if she had picked up lidocaine cream to numb her port. She responded that she had and asked, "How am I supposed to use it." Explained that she should put a quarter size glob on top of her port about an hour prior to her flush appointment. This would be about 0715. Pt asked, "Can you tell me all of that again?" Pt spelling words as she took notes, like "g-[id number]". Pt asked, "If they are going to take the cream off to flush my port will it take away the numbness?" Explained that the cream numbs before it is taken off and that she should feel minimal to no pain when her port is accessed.   In addition to concern regarding nutrition appointment being rescheduled, pt seemed to have a lot of questions about her port care, lab work, and infusion. Although pt has received treatment recently in [facility], sent scheduling message for nutrition appt and education with Abigail Butts, RN or Janifer Adie, RN as pt was confused by the administration of her treatment, what her treatment was for, and when she would need to come in.  Clarified appointment day, time, and location multiple times, as well as, explaining that the pt did not need to worry about her appointments. She should expect a call from our schedulers to add nutrition and education for clarity. Pt verbalized understanding. High  priority scheduling message sent for education and nutrition add-on.

## 2017-12-30 NOTE — Telephone Encounter (Signed)
After reading provider documentation will print added appointments when patient return. Will not mail the additional appointment at this time to prevent patient from being confused about her return visits that is before these newly added appointments. Per 3/28 los

## 2017-12-30 NOTE — Telephone Encounter (Signed)
FYI "I left yesterday so distraught I forgot to see the dietician.  I need to apologize to her and reschedule.  I have so much on my mind and have trouble with my mind."  Asked if she wants to be seen on treatment day of January 06, 2018.  "No one has talked to me about starting treatment next week.  I need to talk with someone about that."  Call transferred.    No scheduling message sent.  Ready to route, just noticed Nutrition appointment rescheduled for January 04, 2018.

## 2018-01-04 ENCOUNTER — Inpatient Hospital Stay: Payer: Medicare Other | Attending: Hematology | Admitting: Nutrition

## 2018-01-04 ENCOUNTER — Inpatient Hospital Stay: Payer: Medicare Other

## 2018-01-04 NOTE — Progress Notes (Signed)
77 year old female diagnosed with multiple myeloma.  She is a patient of Dr. Irene Limbo.  Past medical history includes anxiety, PTSD, stroke, melanoma, hypertension, hyperlipidemia, hernia, diverticulosis, chronic kidney disease, and thyroid problems.  Medications include Xanax, Klonopin, Synthroid, multivitamin, and Zocor.  Labs were reviewed.  Height: 5 feet 1 inch. Weight: 128.3 pounds. Usual body weight: 147 pounds per patient.  Patient weighed 139 pounds in May 2018. BMI: 24.24.  I met with patient along with Edwyna Shell, CSW. Patient reports she has a history of nausea.  Patient reports this has resolved. Patient denies chewing and swallowing difficulties. Patient lives alone and will cook occasionally but she also likes to eat out or have ready prepared foods available. Patient reports she is concerned about the sugar in her food and feeding her cancer.  Nutrition diagnosis: Unintended weight loss related to multiple myeloma and associated treatments as evidenced by 8% weight loss over 11 months.   Intervention: I educated patient to consume smaller amounts of food more often throughout the day. Reviewed high-protein foods with patient and provided fact sheets. Brief education provided on sugar and cancer along with a fact sheet. Reviewed easy, "ready to eat foods " that she could purchase at the grocery store. Recommended patient drink oral nutrition supplements twice daily between meals.  Provided coupons.  Provided samples. Teach back method was used.  Contact information provided.  Monitoring evaluation goals: Patient will tolerate adequate calories and protein to minimize weight loss.  Next visit: To be scheduled as needed.  **Disclaimer: This note was dictated with voice recognition software. Similar sounding words can inadvertently be transcribed and this note may contain transcription errors which may not have been corrected upon publication of note.**

## 2018-01-05 ENCOUNTER — Telehealth: Payer: Self-pay

## 2018-01-05 ENCOUNTER — Encounter: Payer: Self-pay | Admitting: General Practice

## 2018-01-05 NOTE — Telephone Encounter (Signed)
This RN received a VM from the pt stating, "Hello this is Krystal Labree. [name]. I need somebody to help me right away. I'm supposed to be taking some medicine and picked it up. I saw Dr. Irene Limbo. He is supposed to see me in the emergency room tomorrow morning at 6:30a.m. I take tablets, but I don't understand it twice daily, and then some other medicine as needed for nausea and vomiting. And the nurse at the desk couldn't help me. Can somebody at the desk please get in touch with Dr. Grier Mitts nurse and call [name]. I will not be there tomorrow for the procedure if I do not get called today. I'm not going in blind for this procedure. I don't understand all that I have to do with this medicine for nausea and vomiting. Please call [name]. Madani at [phone] in [place], [place]. Please call me. Give me a call, please."    Left VM on pt home phone. Attempt to call pt on mobile phone and no answer.   Left VM on phone with [name], [name] [phone] notifying her of continued confusion and concern that sons need to be involved in pt care. However, HIPAA release has not been signed at this time to share medical information with sons. Recent note from Krystal Lair, [name] shows that attempts have been made to contact son, [name], without any answer or return call. Called [name], and received greeting that she will be out of town until Monday 01/09/18.   Awaiting communication from pt and [name], [name].
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
## 2018-01-05 NOTE — Progress Notes (Signed)
[place] CSW Progress Note  CSW made report to Adult Protective Services Warren Danes [phone]).  Report made on basis of inability to care for herself, inability to understand/retain information re her medications and treatment plan, lack of social support, no family involvement, difficulty managing complexities of treatment.    Edwyna Shell, LCSW Clinical Social Worker Phone:  [phone]

## 2018-01-05 NOTE — Progress Notes (Signed)
[facility] CSW Progress Notes  CSW received report that patient has expressed difficulty in understanding how/when to take medications, confusion about appointments, frequent missed appointments, confusion about directions from providers.  CSW left VM w [place] Adult Protective Services to make report and provide information.  Concern that patient is not able to manage herself safely in the community.  Staff have been unable to reach patient's family members despite multiple calls on different dates/times.  Awaiting call back.  Edwyna Shell, LCSW Clinical Social Worker Phone:  [phone]

## 2018-01-06 ENCOUNTER — Other Ambulatory Visit: Payer: Medicare Other

## 2018-01-06 ENCOUNTER — Ambulatory Visit: Payer: Medicare Other

## 2018-01-06 ENCOUNTER — Telehealth: Payer: Self-pay

## 2018-01-06 NOTE — Patient Outreach (Signed)
Patient transferred by HTA member. Patient felt she had been overwhelmed with consistent unscheduled home visits from [facility].  CMA verified care manager Deloria Lair, NP. Patient was familiar with Kayleen Memos, but states she started receiving additional visits without schedule. Patient is not happy with the lack of communication but was informed that Kayleen Memos had made arrangements for patient to have health care personnel come to patients home due to her being a cancer patient.  Patient felt this may be best for someone else, but is against having strangers in the home because she lives alone. Call documented for per patient request. Personal in basket message will be sent to care manager Deloria Lair, NP as well in hopes she can help ease the patient.  [place] Management Assistant

## 2018-01-09 ENCOUNTER — Encounter: Payer: Self-pay | Admitting: *Deleted

## 2018-01-09 ENCOUNTER — Other Ambulatory Visit: Payer: Self-pay | Admitting: *Deleted

## 2018-01-09 DIAGNOSIS — C9000 Multiple myeloma not having achieved remission: Secondary | ICD-10-CM | POA: Diagnosis not present

## 2018-01-09 NOTE — Patient Outreach (Signed)
Follow up phone call to request in home visit to discuss LTC plans. Today, pt is anxious to get off the phone. She says she has been so busy and has been to see Dr. Melba Coon is [place] with a friend of hers. She says she is not interested in moving, selling her house, or anything like that now and she is not going to be pushed into it.   I told her that her care team is only interested in her well-being and safety. I asked if I could call her again next week and she said," No, maybe next year."  I was hoping to make and appt for she and I to go visit [place] which is a retirement community close to her current residence.  I will advise her PCP that I will be closing her case. I am going to contact her son and advise them of our concerns for her safety.  Eulah Pont. Myrtie Neither, MSN, [facility] Gerontological Nurse Practitioner [facility] Care Management [phone]

## 2018-01-11 ENCOUNTER — Encounter: Payer: Self-pay | Admitting: General Practice

## 2018-01-11 DIAGNOSIS — F411 Generalized anxiety disorder: Secondary | ICD-10-CM | POA: Diagnosis not present

## 2018-01-11 NOTE — Progress Notes (Signed)
[place] CSW Progress Note  Call from [facility] intake.  Report has been screened in/accepted.  Case has been assigned to social worker Devoria Albe [phone]).  Edwyna Shell, LCSW Clinical Social Worker Phone:  [phone]

## 2018-01-11 NOTE — Progress Notes (Signed)
[place] CSW Progress Notes  CSW spoke w Adult Protective Services Devoria Albe [phone]), worker will be requesting medical records to assist in investigation of need for protective services involvement.  CSW provided contact information for HIM.  Per worker, she has made a home visit, finds home well kept and patient alert and oriented.  Worker aware of various providers providing care to patient, will be requesting records from these providers.  Stressed that protective services cannot force patient to make "good decisions", can only attempt to guide and provide resources at this point.   Edwyna Shell, LCSW Clinical Social Worker Phone:  [phone]

## 2018-01-12 ENCOUNTER — Other Ambulatory Visit: Payer: Medicare Other

## 2018-01-12 ENCOUNTER — Ambulatory Visit: Payer: Medicare Other

## 2018-01-12 ENCOUNTER — Ambulatory Visit: Payer: Medicare Other | Admitting: Hematology

## 2018-01-19 ENCOUNTER — Other Ambulatory Visit: Payer: Medicare Other

## 2018-01-19 ENCOUNTER — Ambulatory Visit: Payer: Medicare Other

## 2018-01-24 DIAGNOSIS — R300 Dysuria: Secondary | ICD-10-CM | POA: Diagnosis not present

## 2018-01-24 DIAGNOSIS — R319 Hematuria, unspecified: Secondary | ICD-10-CM | POA: Diagnosis not present

## 2018-01-24 DIAGNOSIS — N760 Acute vaginitis: Secondary | ICD-10-CM | POA: Diagnosis not present

## 2018-01-24 NOTE — Telephone Encounter (Signed)
errror

## 2018-01-24 NOTE — Telephone Encounter (Signed)
Error opening  

## 2018-01-25 ENCOUNTER — Telehealth: Payer: Self-pay | Admitting: Medical Oncology
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
## 2018-01-31 NOTE — Telephone Encounter (Signed)
[name], As you know this patient is having significant memory and self care issues and is unable to keep things straight from a treatment standpoint. She changed providers from [facility] to here and again is requesting provider changes. I am okay with provider change but I do believe her dementia make her a poor candidate for f/u especially since no other family member will be involved. I recommend SW involvement to determine if she has complete neuro-psych evaluation and psych evaluation to determine if she has decisional capacity . Also PCP needs to be more involved to help this patient navigate her dementia related issues. thx GK

## 2018-02-01 ENCOUNTER — Encounter: Payer: Self-pay | Admitting: General Practice

## 2018-02-01 ENCOUNTER — Telehealth: Payer: Self-pay

## 2018-02-01 NOTE — Progress Notes (Signed)
[place] CSW Progress Note  Return call from Devoria Albe, Adult Protective Services (cell [phone]).  Has not received medical records from [facility] necessary to continue to work on case.  Is trying to reach patient's family members as it appears patient has a large and supportive family.  CSW advised that [facility] has not been able to reach family members and that patient needs help in keeping track of appointments, treatments and similar.  Messaged HIM to pursue need for medical records requested by APS.  MD advised by staff message.  Edwyna Shell, LCSW Clinical Social Worker Phone:  [phone]

## 2018-02-01 NOTE — Progress Notes (Signed)
[place] CSW Progress Notes  CSW received staff message from treatment provider stating that patient is having difficulties remembering days, appointments, seems confused re what/when to do.  Called Adult Materials engineer, left messages twice, requested calls back to discuss additional concerns.  Awaiting return call.  Edwyna Shell, LCSW Clinical Social Worker Phone:  [phone]
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
## 2018-02-02 ENCOUNTER — Ambulatory Visit: Payer: Medicare Other

## 2018-02-02 ENCOUNTER — Other Ambulatory Visit: Payer: Self-pay

## 2018-02-02 ENCOUNTER — Emergency Department (HOSPITAL_COMMUNITY)
Admission: EM | Admit: 2018-02-02 | Discharge: 2018-02-02 | Disposition: A | Discharge status: Home / Self Care | Payer: Medicare Other | Attending: Emergency Medicine | Admitting: Emergency Medicine

## 2018-02-02 ENCOUNTER — Other Ambulatory Visit: Payer: Medicare Other

## 2018-02-02 ENCOUNTER — Encounter (HOSPITAL_COMMUNITY): Payer: Self-pay | Admitting: Emergency Medicine

## 2018-02-02 DIAGNOSIS — Z79899 Other long term (current) drug therapy: Secondary | ICD-10-CM | POA: Diagnosis not present

## 2018-02-02 DIAGNOSIS — Y939 Activity, unspecified: Secondary | ICD-10-CM | POA: Insufficient documentation

## 2018-02-02 DIAGNOSIS — E039 Hypothyroidism, unspecified: Secondary | ICD-10-CM | POA: Insufficient documentation

## 2018-02-02 DIAGNOSIS — Y929 Unspecified place or not applicable: Secondary | ICD-10-CM | POA: Insufficient documentation

## 2018-02-02 DIAGNOSIS — J45909 Unspecified asthma, uncomplicated: Secondary | ICD-10-CM | POA: Diagnosis not present

## 2018-02-02 DIAGNOSIS — I10 Essential (primary) hypertension: Secondary | ICD-10-CM

## 2018-02-02 DIAGNOSIS — Y999 Unspecified external cause status: Secondary | ICD-10-CM | POA: Insufficient documentation

## 2018-02-02 DIAGNOSIS — L309 Dermatitis, unspecified: Secondary | ICD-10-CM | POA: Diagnosis not present

## 2018-02-02 DIAGNOSIS — W57XXXA Bitten or stung by nonvenomous insect and other nonvenomous arthropods, initial encounter: Secondary | ICD-10-CM | POA: Diagnosis not present

## 2018-02-02 DIAGNOSIS — N189 Chronic kidney disease, unspecified: Secondary | ICD-10-CM | POA: Diagnosis not present

## 2018-02-02 DIAGNOSIS — S80862A Insect bite (nonvenomous), left lower leg, initial encounter: Secondary | ICD-10-CM | POA: Insufficient documentation

## 2018-02-02 DIAGNOSIS — L720 Epidermal cyst: Secondary | ICD-10-CM | POA: Diagnosis not present

## 2018-02-02 DIAGNOSIS — I129 Hypertensive chronic kidney disease with stage 1 through stage 4 chronic kidney disease, or unspecified chronic kidney disease: Secondary | ICD-10-CM | POA: Diagnosis not present

## 2018-02-02 MED ORDER — CEPHALEXIN 250 MG PO CAPS
500.0000 mg | ORAL_CAPSULE | Freq: Once | ORAL | Status: AC
  Administered 2018-02-02: 500 mg via ORAL
  Filled 2018-02-02: qty 2

## 2018-02-02 MED ORDER — CEPHALEXIN 500 MG PO CAPS: 500.0000 mg | ORAL_CAPSULE | Freq: Four times a day (QID) | ORAL | 0 refills | Status: AC

## 2018-02-02 NOTE — Discharge Instructions (Signed)
If you develop worsening redness or pain in your leg where the insect bite is then return to the ER for evaluation or see her doctor.  If you develop a severe headache, chest pain, weakness or numbness or blurry vision or any other new/concerning symptoms then return to the ER or see her doctor at this could be related to your blood pressure.  Take your blood pressure medicine as prescribed.

## 2018-02-02 NOTE — ED Notes (Signed)
Patient Alert and oriented to baseline. Stable and ambulatory to baseline. Patient verbalized understanding of the discharge instructions.  Patient belongings were taken by the patient.   

## 2018-02-02 NOTE — ED Provider Notes (Signed)
[facility] Provider Note   CSN: [id number] Arrival date & time: 02/02/18  1628     History   Chief Complaint Chief Complaint  Patient presents with  . Insect Bite    HPI [name] is a 77 y.o. female.  HPI  77 year old female presents with concern for a spider bite to her left lower extremity.  She was working in the yard 2 days ago and then since yesterday has noticed swelling that has progressively worsened.  Started as a pinprick red dot and now is more swollen.  It is painful and itchy.  She went to her dermatologist today and they prescribed her creams but states it was around $300.  She does not know what they diagnosed her with more prescribed to her.  She denies any other symptoms.  She is noted to be hypertensive and states she has a slight headache has not taken her evening medicines yet.  Past Medical History:  Diagnosis Date  . Anxiety   . Arthritis   . Asthma   . Cancer (HCC)    THYROID, SKIN, NOSE, BONE  . Chronic kidney disease   . Diverticulosis    Pt reported on 03/15/12  . Eye problems    LOST VISION RIGHT EYE  . Hernia   . History of blood clots    LEG  . Hyperlipidemia   . Hypertension   . Melanoma ([place])   . Melanoma ([place]) 1989   chest  . MGUS (monoclonal gammopathy of unknown significance)   . MGUS (monoclonal gammopathy of unknown significance)   . Mitral valve prolapse   . Perforated bowel ([place])   . Peritonitis ([place])   . Smoldering multiple myeloma ([place])   . Stroke ([place])   . Thyroid disease     Patient Active Problem List   Diagnosis Date Noted  . Multiple myeloma not having achieved remission ([place]) 12/20/2017  . Counseling regarding advanced care planning and goals of care 12/20/2017  . Port-A-Cath in place 12/16/2017  . HCAP (healthcare-associated pneumonia) 11/19/2017  . Immunocompromised state due to drug therapy 11/19/2017  . History of CVA (cerebrovascular accident) 11/19/2017  .  Hypertension 11/19/2017  . Hyperlipidemia 11/19/2017  . Asthma 11/19/2017  . 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 Hypothyroidism, adult 11/19/2017  . Anxiety disorder 11/19/2017  . Mild dementia 11/19/2017  . Periorbital edema 01/22/2015  . Allergic rhinitis 08/12/2014  . Anxiety 07/04/2014  . 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[place]) 07/04/2014  . Multiple myeloma without remission ([place]) 11/01/2013  . Cerebral vascular accident ([place]) 02/19/2013  . Bloodgood disease 02/19/2013  . Acid reflux 02/19/2013  . Blood in the urine 02/19/2013  . Billowing mitral valve 02/19/2013  . Adenomatous colon polyp 02/19/2013  . 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 Cataract, nuclear 12/16/2011  . Intragel vitreous hemorrhage ([place]) 12/16/2011  . Age-related macular degeneration, dry 12/02/2011  . Post-radiation retinopathy 12/02/2011  . Pseudophakia 12/02/2011  . MGUS (monoclonal gammopathy of unknown significance) 11/26/2011  . 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 Extrinsic asthma 09/21/2011  . Colon, diverticulosis 07/26/2011  . Benign hypertensive heart disease without heart failure 03/31/2011  . Hypercholesterolemia 03/31/2011  . Hypothyroidism 03/31/2011  . History of melanoma 03/31/2011  . Hx of thyroid cancer 03/31/2011  . H/O malignant melanoma of skin 03/31/2011    Past Surgical History:  Procedure Laterality Date  . ABDOMINAL HYSTERECTOMY    . BREAST LUMPECTOMY  12/2010  R breast  . BREAST LUMPECTOMY  2004   L axilla neg for cancer.  [name] CARDIOVASCULAR STRESS TEST  2006   NORMAL  . HERNIA REPAIR    . LEG SURGERY     VEIN & BLOOD CLOT  . MELANOMA EXCISION     R eye  . ROTATOR CUFF REPAIR  2004   RIGHT SHOULDER  . SKIN CANCER EXCISION  2011, 2013   squamous cell of nose x 2  . TRANSTHORACIC ECHOCARDIOGRAM  2006     OB History   None      Home Medications    Prior to Admission medications   Medication Sig Start Date End Date  Taking? Authorizing Provider  acetaminophen (TYLENOL) 500 MG tablet Take 250-500 mg by mouth every 6 (six) hours as needed for mild pain, moderate pain, fever or headache.    Yes [provider]  ALPRAZolam Duanne Moron) 0.5 MG tablet Take 0.5 mg by mouth 2 (two) times daily as needed for anxiety or sleep.  07/26/11  Yes [provider]  amLODipine (NORVASC) 5 MG tablet Take 1 tablet (5 mg total) by mouth daily. 01/05/17  Yes Burtis Junes, NP  aspirin 81 MG tablet Chew 81 mg by mouth at bedtime.  07/26/11  Yes [provider]  azelastine (ASTELIN) 0.1 % nasal spray Place 1 spray into both nostrils daily as needed for allergies.  05/08/17  Yes [provider]  carboxymethylcellulose (REFRESH TEARS) 0.5 % SOLN Place 1 drop into both eyes 3 (three) times daily as needed (for allergies).   Yes [provider]  clonazePAM (KLONOPIN) 1 MG tablet Take 1 mg by mouth at bedtime.   Yes [provider]  fexofenadine (ALLEGRA) 180 MG tablet Take 180 mg by mouth daily.    Yes [provider]  fluticasone (FLONASE) 50 MCG/ACT nasal spray Place 1 spray into both nostrils 2 (two) times daily. 07/09/15  Yes Nestor, Sonia Baller, MD  guaiFENesin-dextromethorphan (ROBITUSSIN DM) 100-10 MG/5ML syrup Take 5 mLs by mouth every 4 (four) hours as needed for cough. 11/23/17  Yes Mariel Aloe, MD  levothyroxine (SYNTHROID, LEVOTHROID) 125 MCG tablet Take 125 mcg by mouth daily. Take 1 tablet daily during the week only. 05/13/17  Yes [provider]  lidocaine-prilocaine (EMLA) cream Apply to affected area once 12/29/17  Yes Brunetta Genera, MD  loperamide (IMODIUM A-D) 2 MG tablet Take 2-4 mg by mouth 4 (four) times daily as needed for diarrhea or loose stools.   Yes [provider]  MICARDIS 40 MG tablet Take 1 tablet (40 mg total) by mouth daily. 01/05/17  Yes Burtis Junes, NP  Multiple Vitamins-Minerals (PRESERVISION AREDS PO) Take 1 capsule by  mouth 2 (two) times a week.    Yes [provider]  Polyethyl Glycol-Propyl Glycol (SYSTANE) 0.4-0.3 % SOLN Place 1 drop into both eyes 2 (two) times daily as needed (for allergies).   Yes [provider]  prochlorperazine (COMPAZINE) 10 MG tablet Take 1 tablet (10 mg total) by mouth every 6 (six) hours as needed (Nausea or vomiting). 12/29/17  Yes Brunetta Genera, MD  simvastatin (ZOCOR) 20 MG tablet Take 1 tablet (20 mg total) by mouth every evening. 09/26/17  Yes Burtis Junes, NP  valACYclovir (VALTREX) 500 MG tablet Take 1 tablet by mouth daily.  06/23/15  Yes [provider]  acyclovir (ZOVIRAX) 400 MG tablet Take 1 tablet (400 mg total) by mouth 2 (two) times daily. 12/29/17  Brunetta Genera, MD  cephALEXin (KEFLEX) 500 MG capsule Take 1 capsule (500 mg total) by mouth 4 (four) times daily for 5 days. 02/02/18 02/07/18  Sherwood Gambler, MD  OLANZapine (ZYPREXA) 2.5 MG tablet Take 2.5 mg by mouth daily. 01/12/18   [provider]  ondansetron (ZOFRAN) 4 MG tablet Take 1 tablet (4 mg total) by mouth 2 (two) times daily as needed (Nausea or vomiting). Patient not taking: Reported on 02/02/2018 12/29/17   Brunetta Genera, MD    Family History Family History  Problem Relation Age of Onset  . Asthma Father   . Diabetes Father   . Heart failure Father   . Cancer Father        Prostate and kidney cancer  . Emphysema Father   . Diabetes Sister   . Cancer Sister   . Stroke Mother 35       Her mother passed away from this stroke at the age of 76  . Cancer Brother        brain cancer & prostate cancer  . Cervical cancer Sister   . Cancer Sister        Recurrent cancer involving the neck and the jaw    Social History Social History   Tobacco Use  . Smoking status: Passive Smoke Exposure - Never Smoker  . Smokeless tobacco: Never Used  . Tobacco comment: Exposure rarely through parents but significant exposure at work  Substance Use Topics    . Alcohol use: No    Alcohol/week: 0.0 oz  . Drug use: No     Allergies   Aldactone [spironolactone]; Decadrol [dexamethasone]; Doxycycline; Fluconazole; Revlimid [lenalidomide]; Sertraline hcl; Hydrocodone; Levofloxacin; Lipitor [atorvastatin]; Losartan; Prednisolone; Sertraline; Ultram [tramadol]; Levofloxacin; Prednisone; Pseudoephedrine; Sertraline hcl; Sudafed [pseudoephedrine hcl]; Trazodone and nefazodone; Trazodone hcl; Adhesive [tape]; Hydrocodone-acetaminophen; Latex; and Quinolones   Review of Systems Review of Systems  Constitutional: Negative for fever.  Cardiovascular: Negative for chest pain.  Skin: Positive for color change.  Neurological: Positive for headaches.     Physical Exam Updated Vital Signs BP (!) 149/109   Pulse 65   Temp 98.2 F (36.8 C) (Oral)   Resp 12   Ht 5' 1"  (1.549 m)   Wt 54.4 kg (120 lb)   SpO2 100%   BMI 22.67 kg/m   Physical Exam  Constitutional: She is oriented to person, place, and time. She appears well-developed and well-nourished. No distress.  HENT:  Head: Normocephalic and atraumatic.  Right Ear: External ear normal.  Left Ear: External ear normal.  Nose: Nose normal.  Eyes: Right eye exhibits no discharge. Left eye exhibits no discharge.  Cardiovascular: Normal rate and regular rhythm.  Pulses:      Dorsalis pedis pulses are 2+ on the left side.  Pulmonary/Chest: Effort normal.  Abdominal: She exhibits no distension.  Musculoskeletal:       Left lower leg: She exhibits tenderness and swelling.       Legs: Neurological: She is alert and oriented to person, place, and time.  Skin: Skin is warm and dry. She is not diaphoretic.  Nursing note and vitals reviewed.    ED Treatments / Results  Labs (all labs ordered are listed, but only abnormal results are displayed) Labs Reviewed - No data to display  EKG None  Radiology No results found.  Procedures Procedures (including critical care time)  Medications  Ordered in ED Medications  cephALEXin (KEFLEX) capsule 500 mg (500 mg Oral Given 02/02/18 2132)  Initial Impression / Assessment and Plan / ED Course  I have reviewed the triage vital signs and the nursing notes.  Pertinent labs & imaging results that were available during my care of the patient were reviewed by me and considered in my medical decision making (see chart for details).     I ultrasounded the small raised lesion on her left lower extremity and there is no obvious fluid collection.  I think this is likely an insect sting with an exaggerated reaction.  However I will cover with antibiotics given some mild erythema.  She is also requesting a referral to oncology as she has been diagnosed with cancer but does not like her oncologist in [facility].  This referral will be given.  Otherwise she appears well and has had some hypertension while in the ED but it has improved without treatment.  Encouraged her to use her home meds when she gets home.  Given her first dose of antibiotics in the ED as well as prescription.  Return precautions.  Final Clinical Impressions(s) / ED Diagnoses   Final diagnoses:  Insect bite of left lower leg, initial encounter  Essential hypertension    ED Discharge Orders        Ordered    cephALEXin (KEFLEX) 500 MG capsule  4 times daily     02/02/18 2133       Sherwood Gambler, MD 02/02/18 2344

## 2018-02-02 NOTE — ED Triage Notes (Signed)
Pt to ED with c/o insect bite to left lower leg.  Pt c/o redness and soreness in leg

## 2018-02-03 DIAGNOSIS — L0292 Furuncle, unspecified: Secondary | ICD-10-CM | POA: Diagnosis not present

## 2018-02-03 DIAGNOSIS — C9000 Multiple myeloma not having achieved remission: Secondary | ICD-10-CM | POA: Diagnosis not present

## 2018-02-06 ENCOUNTER — Telehealth: Payer: Self-pay | Admitting: Hematology

## 2018-02-06 NOTE — Telephone Encounter (Signed)
Faxed ROI to [place] on 02/06/18, Release ID [id number]

## 2018-02-07 ENCOUNTER — Telehealth: Payer: Self-pay | Admitting: Diagnostic Neuroimaging
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
## 2018-02-08 ENCOUNTER — Telehealth: Payer: Self-pay | Admitting: Hematology

## 2018-02-08 ENCOUNTER — Telehealth: Payer: Self-pay | Admitting: *Deleted

## 2018-02-08 NOTE — Telephone Encounter (Signed)
Faxed medical records Release [phone]

## 2018-02-08 NOTE — Telephone Encounter (Signed)
Received call from RN at [facility] with Dr. Leonides Schanz regarding pt's plan of care.  Per Dr. Leonides Schanz, they have tried on multiple occasions to help [name] with her medical and social issues, but [name] continues to be noncompliant.  Dr. Guido Sander office has reached out to [facility], and [name] has refused their help.  Her family has also stopped responding to their contact efforts.  Dr. Leonides Schanz herself has tried contacting patient without success on multiple occassions.  Dr. Leonides Schanz has also attempted to prescribe medications for dementia on multiple occasions and the patient remains noncompliant.  Information reviewed with Dr. Irene Limbo.  Per Dr. Irene Limbo, we are unable to move forward with treatment if the patient does not have family or guardian of sort with her at every visit moving forward.  Patient will also need to see PCP prior to starting treatment to discuss treatments for dementia.  Dr. Irene Limbo to see patient in the next week or two to discuss this.  Message sent to scheduling.

## 2018-02-08 NOTE — Telephone Encounter (Addendum)
Called pt and LVM (ok per DPR) informing her that we received the message and RN spoke with Dr. Leta Baptist. Referral was written after last appointment for pt to have formal cognitive memory testing by Dr. Halina Andreas. At this time that is what Dr. Leta Baptist still recommends and he would be able to call her after she gets the results. Encouraged pt to call the office of Dr. Richrd Sox to make an appointment. Phone number was given for both her office and Dr. Gladstone Lighter office in case pt has any further questions.

## 2018-02-09 ENCOUNTER — Ambulatory Visit: Payer: Medicare Other

## 2018-02-09 ENCOUNTER — Telehealth: Payer: Self-pay | Admitting: *Deleted

## 2018-02-09 ENCOUNTER — Ambulatory Visit: Payer: Medicare Other | Admitting: Hematology

## 2018-02-09 ENCOUNTER — Other Ambulatory Visit: Payer: Medicare Other

## 2018-02-09 ENCOUNTER — Telehealth: Payer: Self-pay | Admitting: Hematology

## 2018-02-09 ENCOUNTER — Telehealth: Payer: Self-pay

## 2018-02-09 DIAGNOSIS — F411 Generalized anxiety disorder: Secondary | ICD-10-CM | POA: Diagnosis not present

## 2018-02-09 NOTE — Telephone Encounter (Signed)
[name] called the office 02/08/2018 and spoke with a medical oncology scheduler requesting an appointment with a female physician. The message below has been routed to Dr. Irene Limbo and his desk nurse.   [name] called into scheduling yesterday requesting an appointment with a new (female) physician. Patient was previously seen by Dr. Irene Limbo and I have seen the notes in Epic regarding her hx in this office as well as pcp/referring. Please reach out to [name] regarding continuum of care as this is not something scheduling is able to facilitate.

## 2018-02-09 NOTE — Telephone Encounter (Signed)
Spoke to patient and scheduled her upcoming appointment. She stated that she might have to r/s. Per 5/9 in basket message

## 2018-02-09 NOTE — Telephone Encounter (Signed)
Patient with a diagnosis of Multiple Myeloma and remote history of melanoma wants to complete a self referral and be seen in this office. Spoke with Dr Marin Olp who is okay to see patient.  Message given to scheduler to reach out to patient to make appointment.

## 2018-02-10 ENCOUNTER — Telehealth: Payer: Self-pay

## 2018-02-10 NOTE — Telephone Encounter (Signed)
In Basket message was incorrect it was meant for another patient. Per 5/10 RN Delle Reining

## 2018-02-13 ENCOUNTER — Telehealth: Payer: Self-pay | Admitting: *Deleted

## 2018-02-13 DIAGNOSIS — W57XXXA Bitten or stung by nonvenomous insect and other nonvenomous arthropods, initial encounter: Secondary | ICD-10-CM | POA: Diagnosis not present

## 2018-02-13 DIAGNOSIS — S80862A Insect bite (nonvenomous), left lower leg, initial encounter: Secondary | ICD-10-CM | POA: Diagnosis not present

## 2018-02-13 NOTE — Telephone Encounter (Signed)
Received call from patient on voicemail stating that she would like to be a patient of Dr. Marin Olp and is a Multiple Myeloma patient who has previously had treatment.  Patient has appointment with Dr. Irene Limbo on Friday apparently.  Spoke with Dr. Marin Olp who stated that since patient has an appointment with Dr. Irene Limbo that she would need to keep her appointment with Dr. Irene Limbo since Dr. Marin Olp will be out of town until next week.  Called patient LMAM to call back

## 2018-02-14 ENCOUNTER — Telehealth: Payer: Self-pay | Admitting: Hematology & Oncology

## 2018-02-14 NOTE — Telephone Encounter (Signed)
Pt called [name] requesting an appointment with Dr Marin Olp several time today. I have explained to patient that  Per Dr Marin Olp that she should keep her follow up appointment on 5/17 with Dr Irene Limbo. At the current moment patient voiced understanding and will find someone to accompany her on 02/17/18 visit

## 2018-02-15 ENCOUNTER — Telehealth: Payer: Self-pay

## 2018-02-15 NOTE — Telephone Encounter (Signed)
Gautam,  See the message below, I believe she is your pt. If you ok with the transfer, I hope [facility] can take her. I will be the back-up. Thanks.   Truitt Merle MD

## 2018-02-15 NOTE — Telephone Encounter (Signed)
Patient called stating she is a cancer patient and desires to see a female physician.  Has an appointment coming up with Dr. Irene Limbo on 02/17/18  Her [phone]

## 2018-02-16 ENCOUNTER — Telehealth: Payer: Self-pay | Admitting: Hematology

## 2018-02-16 ENCOUNTER — Other Ambulatory Visit: Payer: Medicare Other

## 2018-02-16 ENCOUNTER — Telehealth: Payer: Self-pay

## 2018-02-16 ENCOUNTER — Ambulatory Visit: Payer: Medicare Other
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
## 2018-02-16 NOTE — Telephone Encounter (Signed)
Faxed ROI to [facility] on 02/16/18, Release ID [id number]

## 2018-02-17 ENCOUNTER — Other Ambulatory Visit: Payer: Self-pay | Admitting: *Deleted

## 2018-02-17 ENCOUNTER — Inpatient Hospital Stay: Payer: Medicare Other | Admitting: Hematology

## 2018-02-17 DIAGNOSIS — W57XXXD Bitten or stung by nonvenomous insect and other nonvenomous arthropods, subsequent encounter: Secondary | ICD-10-CM | POA: Diagnosis not present

## 2018-02-17 DIAGNOSIS — Z9189 Other specified personal risk factors, not elsewhere classified: Secondary | ICD-10-CM | POA: Diagnosis not present

## 2018-02-17 DIAGNOSIS — S80862D Insect bite (nonvenomous), left lower leg, subsequent encounter: Secondary | ICD-10-CM | POA: Diagnosis not present
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
## 2018-02-20 DIAGNOSIS — F0391 Unspecified dementia with behavioral disturbance: Secondary | ICD-10-CM | POA: Diagnosis not present

## 2018-02-20 DIAGNOSIS — S80862D Insect bite (nonvenomous), left lower leg, subsequent encounter: Secondary | ICD-10-CM | POA: Diagnosis not present

## 2018-02-21 ENCOUNTER — Other Ambulatory Visit: Payer: Self-pay | Admitting: *Deleted

## 2018-02-21 NOTE — Patient Outreach (Signed)
I was able to talk to pt's son, [name] and pt's sister, [name] over the weekend. Family is very concerned but have not been able to influence [name] to consider alternative living options that are safe. I have told them that if we cannot get [name] to agree to moving to an ALF on her own, we would have to persue a competency hearing, so that the judge can appoint a guardian and she will have to comply with the recommendations. They are willing to participate in a family conference with Dr. Leonides Schanz and I with [name].  I have tried to call [name] today and did not reach her but I was able to leave a message on her cell voice mail. I have asked her to return my call. I would like to visit her again on my own to see if I can make any progress on helping her make this decision on her own.  Per request from [employer], RN, APS, I am sending [name] records regarding my visits and phone calls.  Eulah Pont. Myrtie Neither, MSN, [facility] Gerontological Nurse Practitioner [facility] Care Management [phone]

## 2018-02-22 ENCOUNTER — Other Ambulatory Visit: Payer: Self-pay | Admitting: *Deleted

## 2018-02-22 NOTE — Patient Outreach (Signed)
Telephone call to see if pt will allow me to come visit her. She states her schedule is very busy, she has a dentist appt in the am. I told her I would call her at lunch time to see if she would see me in the afternoon. She agreed to this.  She states one of her doctors has let her go. Per chart review that appears to be the [facility]. Pt states she is going to [facility] on June 1st to visit a new cancer doctor. She talks about wanting her son to take her to the beach and that she has plans to go to [place] sometime soon.  I will call her tomorrow.  I also received text messages from pt sister, Jamse Arn today, this was our conversation:  (Mrs. Jamse Arn, pt sister)Mrs. Sphinx, this is Milus Mallick, Dalyla Chui' sister, in [place]. I have spoken to all of my siblings and saw Karalyn's son, Gerald Stabs, at her home briefly on Sat. afternoon. All my siblings are willing to meet if the time works out for them. However, one brother is a Chief Strategy Officer and has to have a house finished by 6/20 and says he could not meet during the week until after that date. Spring rains really delayed his work in early spring. Also, we all feel Kania will not listen to any of [place] singularly or collectively. She gets angry at [place] easily and says we do not love her and are out to get her. We also feel the decision to get her out of her home will have to come from medical professionals. I did feel her out last Sat. as you had suggested about selling her home and possibly moving somewhere else. She said not now maybe later on in a few more years. I tried to get her bills and some  things in file folders for her last Sat.; but, I don't expect her to follow through with her filing, etc. I don't think she can concentrate that long on any one subject. We have no suggestions as to where to place her as she is mad at my sister in [place] right now as well as myself. Could a meeting with just family be set up on a  Sat.? We feel Dezhane would get hostile if she saw all of [place] at a meeting together. We all feel helpless at tbis point and are very saddened by the condition of her mental state at present. She is obsessing about Dr. Baird Cancer and is hysyerical about it at times. She has always been difficult to deal with and ver hard to reason with. Please call me to discuss my response or any questions you have about her siblings' concerns. I will be free today after 3 pm up until 6 then I have church commitments or you may call me tomorrow if that works for you.  (Kassim Guertin, NP)Thank you for this note, Enid Derry, I have further updates too. I called Emmer yesterday to see if I could come and she did not answer or call me back. I would like to see her again in her home alone if I can get her to agree. Her psychiatrist is going to send me his notes which will probably give me some leverage. Call me any time. I will Keep you all out of it if I can so there will be no hard feelings.  (Vannoy)I will get involved if needed; but if Gerald Stabs will work with you that will be the  better solution. I haven't tried to deal with Tim as I have texted him several times this spring about Catrice's deteriorating memory and issues it's creating and got no response. Best of luck to the team! We all love our sister but in reality we are all afraid of her.  Do hope you can understand where we are coming from.  (Everlynn Sagun, NP) Yes, I do. May I use these messages as part of my notes in the medical record or if I go to court with her?  Juanita Laster) Yes, you may! I really don't want to testify but will if I need to. She truly needs help and I fear for her safety  Rondo Spittler C. Myrtie Neither, MSN, [facility] Gerontological Nurse Practitioner [facility] Care Management [phone]

## 2018-02-23 ENCOUNTER — Other Ambulatory Visit: Payer: Self-pay | Admitting: Licensed Clinical Social Worker

## 2018-02-23 ENCOUNTER — Other Ambulatory Visit: Payer: Self-pay | Admitting: *Deleted

## 2018-02-23 NOTE — Patient Outreach (Addendum)
[facility]) Care Management  02/23/2018  [name] [birth year] [id number]  [facility] CSW received new referral from [facility] NP Deloria Lair who is wanting guidance on a possible competency hearing that may have to take place as patient is refusing LTC and desperately needs it. Patient has had 3-4 previous APS reports filed. [facility] CSW completed call and discussed case briefly with Deloria Lair. She is unsure if a petition for adjudication of incompetence and application for appointment of guardian had been filed yet. [facility] CSW completed research and sent email that provided education on the competency hearing process as well as application forms for petitioning for incompetence. [facility] CSW will follow up with Deloria Lair tomorrow won 02/24/18. [facility] CSW will not open program at this time.  Eula Fried, BSW, MSW, [email or web address] Phone: [phone] Fax: [phone]

## 2018-02-23 NOTE — Patient Outreach (Signed)
Telephone call to pt to request home visit. [name] did answer the phone this morning and I asked her how her dentist visit went. She said she went but could not have the procedure because she forgot to take the antibiotic she is supposed to take before dental procedures. She has been rescheduled for next Wednesday.  She tells me about getting an appt with a new oncologist in [place] a female and that appt is also next week.  I asked her who takes her to these appts and she says a lady we call "Tang" that goes to my church, [employer].  I will reach out to these new contacts and make sure they have needed information also to see if I can get her friend, [name], to tell me how [name] is when they are together and ask if she has concerns for her safety.  I did reach Barnet Glasgow, who does transport [name] to appts and things she likes to do. They have known each other for years. She tells me [name] is usually hyper. She says some family events or medical events have sent her off and really affected her. She is obsessed that her children are NOT perfect. Things her boys do as she reports them to her worry [name]. She is forgetful and scattered. When things don't go her way like she wants them to that does not go over well. I think she is realizing that she needs help but I think it will take a substantial event to make her do that.   I emphasized that our conversation should not be mentioned to [facility] as this would totally alienate me from her care. I asked that if she thinks of anything else to please call me.   Eulah Pont. Myrtie Neither, MSN, [facility] Gerontological Nurse Practitioner [facility] Care Management [phone]

## 2018-02-24 ENCOUNTER — Other Ambulatory Visit: Payer: Self-pay | Admitting: *Deleted

## 2018-02-24 ENCOUNTER — Other Ambulatory Visit: Payer: Self-pay | Admitting: Licensed Clinical Social Worker

## 2018-02-24 NOTE — Patient Outreach (Signed)
Care coordination: Called APS to speak with Everett Graff, RN, case worker on Mrs. Stjulien case. I have learned another report has been filed on May 17th. I was not able to talk with Chrystal today, she is away from the office. I left her a message to return my call so that I can update her on this case.  I have also called Dr. Leonides Schanz so that I could give her an update and left a message to return my call at her convenience.  Dr. Leonides Schanz returned my call. I updated her on contacts with pt son and sister.   Eulah Pont. Myrtie Neither, MSN, [facility] Gerontological Nurse Practitioner [facility] Care Management [phone]

## 2018-02-24 NOTE — Patient Outreach (Signed)
[facility]) Care Management  02/24/2018  [name] [birth year] [id number]  [facility] CSW received message from [facility] NP [place]. Resources successfully received. [facility] CSW will sign off at this time and [facility] NP will notify this [facility] CSW if she needs any further social work assistance with this case.   Eula Fried, BSW, MSW, [email or web address] Phone: [phone] Fax: [phone]

## 2018-02-28 ENCOUNTER — Ambulatory Visit: Payer: Medicare Other | Admitting: Hematology & Oncology

## 2018-02-28 ENCOUNTER — Other Ambulatory Visit: Payer: Medicare Other

## 2018-03-03 DIAGNOSIS — F411 Generalized anxiety disorder: Secondary | ICD-10-CM | POA: Diagnosis not present

## 2018-03-06 DIAGNOSIS — S80862A Insect bite (nonvenomous), left lower leg, initial encounter: Secondary | ICD-10-CM | POA: Diagnosis not present

## 2018-03-14 DIAGNOSIS — H3323 Serous retinal detachment, bilateral: Secondary | ICD-10-CM | POA: Diagnosis not present

## 2018-03-14 DIAGNOSIS — C6931 Malignant neoplasm of right choroid: Secondary | ICD-10-CM | POA: Diagnosis not present

## 2018-03-14 DIAGNOSIS — H31092 Other chorioretinal scars, left eye: Secondary | ICD-10-CM | POA: Diagnosis not present

## 2018-03-14 DIAGNOSIS — H3581 Retinal edema: Secondary | ICD-10-CM | POA: Diagnosis not present

## 2018-03-14 DIAGNOSIS — H31091 Other chorioretinal scars, right eye: Secondary | ICD-10-CM | POA: Diagnosis not present

## 2018-03-21 DIAGNOSIS — C9000 Multiple myeloma not having achieved remission: Secondary | ICD-10-CM | POA: Diagnosis not present

## 2018-03-23 DIAGNOSIS — Z1231 Encounter for screening mammogram for malignant neoplasm of breast: Secondary | ICD-10-CM | POA: Diagnosis not present

## 2018-03-30 ENCOUNTER — Ambulatory Visit: Payer: Medicare Other | Admitting: Internal Medicine

## 2018-03-31 ENCOUNTER — Encounter: Payer: Self-pay | Admitting: Internal Medicine

## 2018-04-03 DIAGNOSIS — I341 Nonrheumatic mitral (valve) prolapse: Secondary | ICD-10-CM | POA: Diagnosis not present

## 2018-04-03 DIAGNOSIS — D801 Nonfamilial hypogammaglobulinemia: Secondary | ICD-10-CM | POA: Diagnosis not present

## 2018-04-03 DIAGNOSIS — H353190 Nonexudative age-related macular degeneration, unspecified eye, stage unspecified: Secondary | ICD-10-CM | POA: Diagnosis not present

## 2018-04-03 DIAGNOSIS — D126 Benign neoplasm of colon, unspecified: Secondary | ICD-10-CM | POA: Diagnosis not present

## 2018-04-03 DIAGNOSIS — J45909 Unspecified asthma, uncomplicated: Secondary | ICD-10-CM | POA: Diagnosis not present

## 2018-04-03 DIAGNOSIS — Z85850 Personal history of malignant neoplasm of thyroid: Secondary | ICD-10-CM | POA: Diagnosis not present

## 2018-04-03 DIAGNOSIS — I119 Hypertensive heart disease without heart failure: Secondary | ICD-10-CM | POA: Diagnosis not present

## 2018-04-03 DIAGNOSIS — E039 Hypothyroidism, unspecified: Secondary | ICD-10-CM | POA: Diagnosis not present

## 2018-04-03 DIAGNOSIS — F419 Anxiety disorder, unspecified: Secondary | ICD-10-CM | POA: Diagnosis not present

## 2018-04-03 DIAGNOSIS — K219 Gastro-esophageal reflux disease without esophagitis: Secondary | ICD-10-CM | POA: Diagnosis not present

## 2018-04-03 DIAGNOSIS — Z8673 Personal history of transient ischemic attack (TIA), and cerebral infarction without residual deficits: Secondary | ICD-10-CM | POA: Diagnosis not present

## 2018-04-03 DIAGNOSIS — G629 Polyneuropathy, unspecified: Secondary | ICD-10-CM | POA: Diagnosis not present

## 2018-04-03 DIAGNOSIS — Z880 Allergy status to penicillin: Secondary | ICD-10-CM | POA: Diagnosis not present

## 2018-04-03 DIAGNOSIS — K5730 Diverticulosis of large intestine without perforation or abscess without bleeding: Secondary | ICD-10-CM | POA: Diagnosis not present

## 2018-04-03 DIAGNOSIS — E7800 Pure hypercholesterolemia, unspecified: Secondary | ICD-10-CM | POA: Diagnosis not present

## 2018-04-03 DIAGNOSIS — H4310 Vitreous hemorrhage, unspecified eye: Secondary | ICD-10-CM | POA: Diagnosis not present

## 2018-04-03 DIAGNOSIS — R2231 Localized swelling, mass and lump, right upper limb: Secondary | ICD-10-CM | POA: Diagnosis not present

## 2018-04-03 DIAGNOSIS — Z85820 Personal history of malignant melanoma of skin: Secondary | ICD-10-CM | POA: Diagnosis not present

## 2018-04-03 DIAGNOSIS — R220 Localized swelling, mass and lump, head: Secondary | ICD-10-CM | POA: Diagnosis not present

## 2018-04-03 DIAGNOSIS — N6019 Diffuse cystic mastopathy of unspecified breast: Secondary | ICD-10-CM | POA: Diagnosis not present

## 2018-04-03 DIAGNOSIS — R319 Hematuria, unspecified: Secondary | ICD-10-CM | POA: Diagnosis not present

## 2018-04-03 DIAGNOSIS — Z6824 Body mass index (BMI) 24.0-24.9, adult: Secondary | ICD-10-CM | POA: Diagnosis not present

## 2018-04-03 DIAGNOSIS — J310 Chronic rhinitis: Secondary | ICD-10-CM | POA: Diagnosis not present

## 2018-04-03 DIAGNOSIS — Z961 Presence of intraocular lens: Secondary | ICD-10-CM | POA: Diagnosis not present

## 2018-04-03 DIAGNOSIS — Z885 Allergy status to narcotic agent status: Secondary | ICD-10-CM | POA: Diagnosis not present

## 2018-04-03 DIAGNOSIS — C9000 Multiple myeloma not having achieved remission: Secondary | ICD-10-CM | POA: Diagnosis not present

## 2018-04-04 DIAGNOSIS — I10 Essential (primary) hypertension: Secondary | ICD-10-CM | POA: Diagnosis not present

## 2018-04-10 ENCOUNTER — Telehealth: Payer: Self-pay | Admitting: *Deleted

## 2018-04-10 ENCOUNTER — Ambulatory Visit: Payer: Medicare Other | Admitting: Diagnostic Neuroimaging

## 2018-04-10 NOTE — Telephone Encounter (Signed)
Pt no showed appt today

## 2018-04-11 ENCOUNTER — Encounter: Payer: Self-pay | Admitting: Diagnostic Neuroimaging

## 2018-04-12 DIAGNOSIS — C9202 Acute myeloblastic leukemia, in relapse: Secondary | ICD-10-CM | POA: Diagnosis not present

## 2018-04-12 DIAGNOSIS — C9002 Multiple myeloma in relapse: Secondary | ICD-10-CM | POA: Diagnosis not present

## 2018-04-19 ENCOUNTER — Encounter: Payer: Self-pay | Admitting: *Deleted

## 2018-04-19 ENCOUNTER — Other Ambulatory Visit: Payer: Self-pay | Admitting: *Deleted

## 2018-04-19 NOTE — Patient Outreach (Addendum)
Telephone assessment and case closure due to pt unwillingness to participate. I was able to talk to [name] today. She reports she has just been so busy. She is now going [facility]. She says she is not going back to see her PCP she has had for years but will see another doctor in the same practice.  She says she has been to [place] for an eye appt. And got to go to a family wedding which she really enjoyed.  I advised her I am closing her case, since she is too busy to see me and I have trying for months.   I have advised her she can contact me in the future.  I will advise Dr. Leonides Schanz.  [facility] CM Care Plan Problem One     Most Recent Value  Care Plan Problem One  Alteration in executive functioning necesitating need for higher LOC placement.  Role Documenting the Problem One  Care Management [place] for Problem One  Active  [facility] Long Term Goal   Pt will relocate to new residence in the next 90 days.  [facility] Long Term Goal Start Date  02/17/18  [facility] CM Short Term Goal #1   Have family meeting to disucss and start plan for pt relocation within the next 30 days.  [facility] CM Short Term Goal #1 Start Date  02/17/18  [facility] CM Short Term Goal #2   Pt and family will be given options for relocation pertaining to geography and financial ability over the next 30 days.  [facility] CM Short Term Goal #2 Start Date  02/17/18  [facility] for Short Term Goal #2  Unable to make any progress. Pt informed of case closure as she is resistant to making any further appts with me.       Eulah Pont. Myrtie Neither, MSN, [facility] Gerontological Nurse Practitioner [facility] Care Management [phone]

## 2018-04-24 DIAGNOSIS — Z5112 Encounter for antineoplastic immunotherapy: Secondary | ICD-10-CM | POA: Diagnosis not present

## 2018-04-24 DIAGNOSIS — C9002 Multiple myeloma in relapse: Secondary | ICD-10-CM | POA: Diagnosis not present

## 2018-05-03 ENCOUNTER — Other Ambulatory Visit: Payer: Self-pay | Admitting: Nurse Practitioner

## 2018-05-08 DIAGNOSIS — Z85820 Personal history of malignant melanoma of skin: Secondary | ICD-10-CM | POA: Diagnosis not present

## 2018-05-08 DIAGNOSIS — C9002 Multiple myeloma in relapse: Secondary | ICD-10-CM | POA: Diagnosis not present

## 2018-05-08 DIAGNOSIS — Z5111 Encounter for antineoplastic chemotherapy: Secondary | ICD-10-CM | POA: Diagnosis not present

## 2018-05-15 DIAGNOSIS — R319 Hematuria, unspecified: Secondary | ICD-10-CM | POA: Diagnosis not present

## 2018-05-15 DIAGNOSIS — F419 Anxiety disorder, unspecified: Secondary | ICD-10-CM | POA: Diagnosis not present

## 2018-05-15 DIAGNOSIS — N390 Urinary tract infection, site not specified: Secondary | ICD-10-CM | POA: Diagnosis not present

## 2018-05-17 DIAGNOSIS — N390 Urinary tract infection, site not specified: Secondary | ICD-10-CM | POA: Diagnosis not present

## 2018-05-17 DIAGNOSIS — L904 Acrodermatitis chronica atrophicans: Secondary | ICD-10-CM | POA: Diagnosis not present

## 2018-05-22 DIAGNOSIS — C9002 Multiple myeloma in relapse: Secondary | ICD-10-CM | POA: Diagnosis not present

## 2018-05-22 DIAGNOSIS — Z5112 Encounter for antineoplastic immunotherapy: Secondary | ICD-10-CM | POA: Diagnosis not present

## 2018-05-24 DIAGNOSIS — N390 Urinary tract infection, site not specified: Secondary | ICD-10-CM | POA: Diagnosis not present

## 2018-05-24 DIAGNOSIS — F419 Anxiety disorder, unspecified: Secondary | ICD-10-CM | POA: Diagnosis not present

## 2018-05-24 DIAGNOSIS — R319 Hematuria, unspecified: Secondary | ICD-10-CM | POA: Diagnosis not present

## 2018-06-07 DIAGNOSIS — J3489 Other specified disorders of nose and nasal sinuses: Secondary | ICD-10-CM | POA: Diagnosis not present

## 2018-06-07 DIAGNOSIS — R05 Cough: Secondary | ICD-10-CM | POA: Diagnosis not present

## 2018-06-07 DIAGNOSIS — J069 Acute upper respiratory infection, unspecified: Secondary | ICD-10-CM | POA: Diagnosis not present

## 2018-06-07 DIAGNOSIS — R0600 Dyspnea, unspecified: Secondary | ICD-10-CM | POA: Diagnosis not present

## 2018-06-08 DIAGNOSIS — Z85820 Personal history of malignant melanoma of skin: Secondary | ICD-10-CM | POA: Diagnosis not present

## 2018-06-08 DIAGNOSIS — Z85828 Personal history of other malignant neoplasm of skin: Secondary | ICD-10-CM | POA: Diagnosis not present

## 2018-06-08 DIAGNOSIS — L821 Other seborrheic keratosis: Secondary | ICD-10-CM | POA: Diagnosis not present

## 2018-06-08 DIAGNOSIS — L814 Other melanin hyperpigmentation: Secondary | ICD-10-CM | POA: Diagnosis not present

## 2018-06-08 DIAGNOSIS — D1801 Hemangioma of skin and subcutaneous tissue: Secondary | ICD-10-CM | POA: Diagnosis not present

## 2018-06-08 DIAGNOSIS — D225 Melanocytic nevi of trunk: Secondary | ICD-10-CM | POA: Diagnosis not present

## 2018-06-10 DIAGNOSIS — J069 Acute upper respiratory infection, unspecified: Secondary | ICD-10-CM | POA: Diagnosis not present

## 2018-06-10 DIAGNOSIS — C9000 Multiple myeloma not having achieved remission: Secondary | ICD-10-CM | POA: Diagnosis not present

## 2018-06-12 DIAGNOSIS — R0781 Pleurodynia: Secondary | ICD-10-CM | POA: Diagnosis not present

## 2018-06-12 DIAGNOSIS — Z5112 Encounter for antineoplastic immunotherapy: Secondary | ICD-10-CM | POA: Diagnosis not present

## 2018-06-12 DIAGNOSIS — R079 Chest pain, unspecified: Secondary | ICD-10-CM | POA: Diagnosis not present

## 2018-06-12 DIAGNOSIS — R51 Headache: Secondary | ICD-10-CM | POA: Diagnosis not present

## 2018-06-12 DIAGNOSIS — I10 Essential (primary) hypertension: Secondary | ICD-10-CM | POA: Diagnosis not present

## 2018-06-12 DIAGNOSIS — Z885 Allergy status to narcotic agent status: Secondary | ICD-10-CM | POA: Diagnosis not present

## 2018-06-12 DIAGNOSIS — Z91040 Latex allergy status: Secondary | ICD-10-CM | POA: Diagnosis not present

## 2018-06-12 DIAGNOSIS — Z8673 Personal history of transient ischemic attack (TIA), and cerebral infarction without residual deficits: Secondary | ICD-10-CM | POA: Diagnosis not present

## 2018-06-12 DIAGNOSIS — Z7982 Long term (current) use of aspirin: Secondary | ICD-10-CM | POA: Diagnosis not present

## 2018-06-12 DIAGNOSIS — C9002 Multiple myeloma in relapse: Secondary | ICD-10-CM | POA: Diagnosis not present

## 2018-06-12 DIAGNOSIS — Z888 Allergy status to other drugs, medicaments and biological substances status: Secondary | ICD-10-CM | POA: Diagnosis not present

## 2018-06-12 DIAGNOSIS — I1310 Hypertensive heart and chronic kidney disease without heart failure, with stage 1 through stage 4 chronic kidney disease, or unspecified chronic kidney disease: Secondary | ICD-10-CM | POA: Diagnosis not present

## 2018-06-12 DIAGNOSIS — Z9109 Other allergy status, other than to drugs and biological substances: Secondary | ICD-10-CM | POA: Diagnosis not present

## 2018-06-12 DIAGNOSIS — Z79899 Other long term (current) drug therapy: Secondary | ICD-10-CM | POA: Diagnosis not present

## 2018-06-12 DIAGNOSIS — G629 Polyneuropathy, unspecified: Secondary | ICD-10-CM | POA: Diagnosis not present

## 2018-06-12 DIAGNOSIS — H538 Other visual disturbances: Secondary | ICD-10-CM | POA: Diagnosis not present

## 2018-06-12 DIAGNOSIS — I4949 Other premature depolarization: Secondary | ICD-10-CM | POA: Diagnosis not present

## 2018-06-12 DIAGNOSIS — Z6824 Body mass index (BMI) 24.0-24.9, adult: Secondary | ICD-10-CM | POA: Diagnosis not present

## 2018-06-12 DIAGNOSIS — R319 Hematuria, unspecified: Secondary | ICD-10-CM | POA: Diagnosis not present

## 2018-06-12 DIAGNOSIS — Z9221 Personal history of antineoplastic chemotherapy: Secondary | ICD-10-CM | POA: Diagnosis not present

## 2018-06-12 DIAGNOSIS — C9000 Multiple myeloma not having achieved remission: Secondary | ICD-10-CM | POA: Diagnosis not present

## 2018-06-12 DIAGNOSIS — R0789 Other chest pain: Secondary | ICD-10-CM | POA: Diagnosis not present

## 2018-06-12 DIAGNOSIS — E785 Hyperlipidemia, unspecified: Secondary | ICD-10-CM | POA: Diagnosis not present

## 2018-06-12 DIAGNOSIS — R05 Cough: Secondary | ICD-10-CM | POA: Diagnosis not present

## 2018-06-12 DIAGNOSIS — D472 Monoclonal gammopathy: Secondary | ICD-10-CM | POA: Diagnosis not present

## 2018-06-12 DIAGNOSIS — Z86718 Personal history of other venous thrombosis and embolism: Secondary | ICD-10-CM | POA: Diagnosis not present

## 2018-06-12 DIAGNOSIS — Z880 Allergy status to penicillin: Secondary | ICD-10-CM | POA: Diagnosis not present

## 2018-06-13 ENCOUNTER — Encounter (HOSPITAL_COMMUNITY): Payer: Self-pay | Admitting: *Deleted

## 2018-06-13 ENCOUNTER — Other Ambulatory Visit: Payer: Self-pay

## 2018-06-13 ENCOUNTER — Emergency Department (HOSPITAL_COMMUNITY)
Admission: EM | Admit: 2018-06-13 | Discharge: 2018-06-13 | Disposition: A | Discharge status: Home / Self Care | Payer: Medicare Other | Attending: Emergency Medicine | Admitting: Emergency Medicine

## 2018-06-13 ENCOUNTER — Emergency Department (HOSPITAL_COMMUNITY): Payer: Medicare Other

## 2018-06-13 DIAGNOSIS — I119 Hypertensive heart disease without heart failure: Secondary | ICD-10-CM | POA: Diagnosis not present

## 2018-06-13 DIAGNOSIS — Z79899 Other long term (current) drug therapy: Secondary | ICD-10-CM | POA: Diagnosis not present

## 2018-06-13 DIAGNOSIS — J45909 Unspecified asthma, uncomplicated: Secondary | ICD-10-CM | POA: Diagnosis not present

## 2018-06-13 DIAGNOSIS — Z85820 Personal history of malignant melanoma of skin: Secondary | ICD-10-CM | POA: Diagnosis not present

## 2018-06-13 DIAGNOSIS — R0602 Shortness of breath: Secondary | ICD-10-CM | POA: Diagnosis not present

## 2018-06-13 DIAGNOSIS — J069 Acute upper respiratory infection, unspecified: Secondary | ICD-10-CM | POA: Insufficient documentation

## 2018-06-13 DIAGNOSIS — N189 Chronic kidney disease, unspecified: Secondary | ICD-10-CM | POA: Diagnosis not present

## 2018-06-13 DIAGNOSIS — Z7982 Long term (current) use of aspirin: Secondary | ICD-10-CM | POA: Insufficient documentation

## 2018-06-13 DIAGNOSIS — Z452 Encounter for adjustment and management of vascular access device: Secondary | ICD-10-CM | POA: Diagnosis not present

## 2018-06-13 DIAGNOSIS — I129 Hypertensive chronic kidney disease with stage 1 through stage 4 chronic kidney disease, or unspecified chronic kidney disease: Secondary | ICD-10-CM | POA: Diagnosis not present

## 2018-06-13 DIAGNOSIS — Z7722 Contact with and (suspected) exposure to environmental tobacco smoke (acute) (chronic): Secondary | ICD-10-CM | POA: Diagnosis not present

## 2018-06-13 DIAGNOSIS — Z91040 Latex allergy status: Secondary | ICD-10-CM | POA: Insufficient documentation

## 2018-06-13 DIAGNOSIS — R05 Cough: Secondary | ICD-10-CM | POA: Diagnosis not present

## 2018-06-13 DIAGNOSIS — R0981 Nasal congestion: Secondary | ICD-10-CM | POA: Diagnosis present

## 2018-06-13 DIAGNOSIS — Z85850 Personal history of malignant neoplasm of thyroid: Secondary | ICD-10-CM | POA: Diagnosis not present

## 2018-06-13 MED ORDER — HEPARIN SOD (PORK) LOCK FLUSH 100 UNIT/ML IV SOLN
500.0000 [IU] | Freq: Once | INTRAVENOUS | Status: AC
  Administered 2018-06-13: 500 [IU]
  Filled 2018-06-13: qty 5

## 2018-06-13 MED ORDER — BENZONATATE 100 MG PO CAPS: 100.0000 mg | ORAL_CAPSULE | Freq: Three times a day (TID) | ORAL | 0 refills | Status: DC

## 2018-06-13 MED ORDER — FLUTICASONE PROPIONATE 50 MCG/ACT NA SUSP
1.0000 | Freq: Every day | NASAL | Status: DC
  Filled 2018-06-13: qty 16

## 2018-06-13 NOTE — ED Triage Notes (Signed)
Pt in asking to have her port de-accessed, she was seen at [facility] yesterday and had some procedures completed and when she left she didn't realize her port was still accessed, states she does not return there for several weeks so she wants it removed. Pt also reports nasal congestion and cough with yellow sputum that started over the weekend, pt receiving chemo and wants to make get evaluated before she get sicker- denies fever, no distress noted

## 2018-06-13 NOTE — Discharge Instructions (Addendum)
You likely have a viral illness.  This should be treated symptomatically. Use Tylenol or ibuprofen as needed for fevers or body aches. Use Flonase daily for nasal congestion and cough. Use cough medicine as needed.  Make sure you stay well-hydrated with water. Wash your hands frequently to prevent spread of infection. Follow-up with your primary care doctor on Friday as needed if your symptoms continue. Return to the emergency room if you develop chest pain, difficulty breathing, or any new or worsening symptoms.

## 2018-06-13 NOTE — ED Provider Notes (Signed)
[facility] EMERGENCY DEPARTMENT Provider Note   CSN: [id number] Arrival date & time: 06/13/18  3151     History   Chief Complaint Chief Complaint  Patient presents with  . Vascular Access Problem  . Cough    HPI [name] is a 77 y.o. female presenting to get her port de-accessed and for further evaluation of her cough and nasal congestion.  Patient states for the past 4 days, she has been having persistent nasal congestion and cough.  She was seen at the walk-in clinic at the ER yesterday, told it was a viral illness.  She has been using a sore throat spray with mild improvement of her pain, but she is still having nasal congestion and cough.  She is not using any cough syrups.  She was given Flonase, which she states helped while she was in the ER, but she is not sure she has any at home.  She denies fevers, chills, chest pain, or difficulty breathing.  Patient states she spent all day in the ER yesterday getting an extensive work-up, when she was discharged I forgot to de-access her port.  Patient is currently receiving chemotherapy, last chemo yesterday.   Additional history obtained from chart review, patient's ER visit from yesterday at [facility] evaluated.  Patient with negative CTA and reassuring blood work.  Symptoms improved with Flonase.  HPI  Past Medical History:  Diagnosis Date  . Anxiety   . Arthritis   . Asthma   . Cancer (HCC)    THYROID, SKIN, NOSE, BONE  . Chronic kidney disease   . Diverticulosis    Pt reported on 03/15/12  . Eye problems    LOST VISION RIGHT EYE  . Hernia   . History of blood clots    LEG  . Hyperlipidemia   . Hypertension   . Melanoma ([place])   . Melanoma ([place]) 1989   chest  . MGUS (monoclonal gammopathy of unknown significance)   . MGUS (monoclonal gammopathy of unknown significance)   . Mitral valve prolapse   . Perforated bowel ([place])   . Peritonitis ([place])   . Smoldering multiple myeloma ([place])   . Stroke ([place])    . Thyroid disease     Patient Active Problem List   Diagnosis Date Noted  . Multiple myeloma not having achieved remission ([place]) 12/20/2017  . Counseling regarding advanced care planning and goals of care 12/20/2017  . Port-A-Cath in place 12/16/2017  . HCAP (healthcare-associated pneumonia) 11/19/2017  . Immunocompromised state due to drug therapy 11/19/2017  . History of CVA (cerebrovascular accident) 11/19/2017  . Hypertension 11/19/2017  . Hyperlipidemia 11/19/2017  . Asthma 11/19/2017  . 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 Hypothyroidism, adult 11/19/2017  . Anxiety disorder 11/19/2017  . Mild dementia 11/19/2017  . Periorbital edema 01/22/2015  . Allergic rhinitis 08/12/2014  . Anxiety 07/04/2014  . 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[place]) 07/04/2014  . Multiple myeloma without remission ([place]) 11/01/2013  . Cerebral vascular accident ([place]) 02/19/2013  . Bloodgood disease 02/19/2013  . Acid reflux 02/19/2013  . Blood in the urine 02/19/2013  . Billowing mitral valve 02/19/2013  . Adenomatous colon polyp 02/19/2013  . 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 Cataract, nuclear 12/16/2011  . Intragel vitreous hemorrhage ([place]) 12/16/2011  . Age-related macular degeneration, dry 12/02/2011  . Post-radiation retinopathy 12/02/2011  . Pseudophakia 12/02/2011  . MGUS (monoclonal gammopathy of unknown significance) 11/26/2011  . 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  Extrinsic asthma 09/21/2011  . Colon, diverticulosis 07/26/2011  . Benign hypertensive heart disease without heart failure 03/31/2011  . Hypercholesterolemia 03/31/2011  . Hypothyroidism 03/31/2011  . History of melanoma 03/31/2011  . Hx of thyroid cancer 03/31/2011  . H/O malignant melanoma of skin 03/31/2011    Past Surgical History:  Procedure Laterality Date  . ABDOMINAL HYSTERECTOMY    . BREAST LUMPECTOMY  12/2010   R breast  . BREAST LUMPECTOMY  2004   L axilla neg for  cancer.  [name] CARDIOVASCULAR STRESS TEST  2006   NORMAL  . HERNIA REPAIR    . LEG SURGERY     VEIN & BLOOD CLOT  . MELANOMA EXCISION     R eye  . ROTATOR CUFF REPAIR  2004   RIGHT SHOULDER  . SKIN CANCER EXCISION  2011, 2013   squamous cell of nose x 2  . TRANSTHORACIC ECHOCARDIOGRAM  2006     OB History   None      Home Medications    Prior to Admission medications   Medication Sig Start Date End Date Taking? Authorizing Provider  acetaminophen (TYLENOL) 500 MG tablet Take 250-500 mg by mouth every 6 (six) hours as needed for mild pain, moderate pain, fever or headache.     [provider]  acyclovir (ZOVIRAX) 400 MG tablet Take 1 tablet (400 mg total) by mouth 2 (two) times daily. 12/29/17   Brunetta Genera, MD  ALPRAZolam Duanne Moron) 0.5 MG tablet Take 0.5 mg by mouth 2 (two) times daily as needed for anxiety or sleep.  07/26/11   [provider]  amLODipine (NORVASC) 5 MG tablet Take 1 tablet (5 mg total) by mouth daily. 01/05/17   Burtis Junes, NP  aspirin 81 MG tablet Chew 81 mg by mouth at bedtime.  07/26/11   [provider]  azelastine (ASTELIN) 0.1 % nasal spray Place 1 spray into both nostrils daily as needed for allergies.  05/08/17   [provider]  benzonatate (TESSALON) 100 MG capsule Take 1 capsule (100 mg total) by mouth every 8 (eight) hours. 06/13/18   Arlyss Weathersby, PA-C  carboxymethylcellulose (REFRESH TEARS) 0.5 % SOLN Place 1 drop into both eyes 3 (three) times daily as needed (for allergies).    [provider]  clonazePAM (KLONOPIN) 1 MG tablet Take 1 mg by mouth at bedtime.    [provider]  fexofenadine (ALLEGRA) 180 MG tablet Take 180 mg by mouth daily.     [provider]  fluticasone (FLONASE) 50 MCG/ACT nasal spray Place 1 spray into both nostrils 2 (two) times daily. 07/09/15   Javier Glazier, MD  guaiFENesin-dextromethorphan (ROBITUSSIN DM) 100-10 MG/5ML syrup Take 5 mLs by mouth  every 4 (four) hours as needed for cough. 11/23/17   Mariel Aloe, MD  levothyroxine (SYNTHROID, LEVOTHROID) 125 MCG tablet Take 125 mcg by mouth daily. Take 1 tablet daily during the week only. 05/13/17   [provider]  lidocaine-prilocaine (EMLA) cream Apply to affected area once 12/29/17   Brunetta Genera, MD  loperamide (IMODIUM A-D) 2 MG tablet Take 2-4 mg by mouth 4 (four) times daily as needed for diarrhea or loose stools.    [provider]  MICARDIS 40 MG tablet Take 1 tablet (40 mg total) by mouth daily. 01/05/17   Burtis Junes, NP  Multiple Vitamins-Minerals (PRESERVISION AREDS PO) Take 1 capsule by mouth 2 (two) times a week.     [provider]  OLANZapine (ZYPREXA) 2.5 MG tablet Take 2.5 mg by mouth daily. 01/12/18   [provider]  ondansetron (ZOFRAN) 4 MG tablet Take 1 tablet (4 mg total) by mouth 2 (two) times daily as needed (Nausea or vomiting). Patient not taking: Reported on 02/02/2018 12/29/17   Brunetta Genera, MD  Polyethyl Glycol-Propyl Glycol (SYSTANE) 0.4-0.3 % SOLN Place 1 drop into both eyes 2 (two) times daily as needed (for allergies).    [provider]  prochlorperazine (COMPAZINE) 10 MG tablet Take 1 tablet (10 mg total) by mouth every 6 (six) hours as needed (Nausea or vomiting). 12/29/17   Brunetta Genera, MD  simvastatin (ZOCOR) 20 MG tablet TAKE 1 TABLET BY MOUTH ONCE DAILY IN THE EVENING 05/03/18   Burtis Junes, NP  valACYclovir (VALTREX) 500 MG tablet Take 1 tablet by mouth daily.  06/23/15   [provider]    Family History Family History  Problem Relation Age of Onset  . Asthma Father   . Diabetes Father   . Heart failure Father   . Cancer Father        Prostate and kidney cancer  . Emphysema Father   . Diabetes Sister   . Cancer Sister   . Stroke Mother 39       Her mother passed away from this stroke at the age of 33  . Cancer Brother        brain cancer & prostate cancer    . Cervical cancer Sister   . Cancer Sister        Recurrent cancer involving the neck and the jaw    Social History Social History   Tobacco Use  . Smoking status: Passive Smoke Exposure - Never Smoker  . Smokeless tobacco: Never Used  . Tobacco comment: Exposure rarely through parents but significant exposure at work  Substance Use Topics  . Alcohol use: No    Alcohol/week: 0.0 standard drinks  . Drug use: No     Allergies   Aldactone [spironolactone]; Decadrol [dexamethasone]; Doxycycline; Fluconazole; Revlimid [lenalidomide]; Sertraline hcl; Hydrocodone; Levofloxacin; Lipitor [atorvastatin]; Losartan; Prednisolone; Sertraline; Ultram [tramadol]; Levofloxacin; Prednisone; Pseudoephedrine; Sertraline hcl; Sudafed [pseudoephedrine hcl]; Trazodone and nefazodone; Trazodone hcl; Adhesive [tape]; Hydrocodone-acetaminophen; Latex; and Quinolones   Review of Systems Review of Systems  HENT: Positive for congestion and sore throat.   Respiratory: Positive for cough.   All other systems reviewed and are negative.    Physical Exam Updated Vital Signs BP (!) 157/101 (BP Location: Left Arm)   Pulse 98   Temp 97.8 F (36.6 C) (Oral)   Resp 17   Ht 5' 1" (1.549 m)   Wt 58.5 kg   SpO2 99%   BMI 24.37 kg/m   Physical Exam  Constitutional: She is oriented to person, place, and time. She appears well-developed and well-nourished. No distress.  HENT:  Head: Normocephalic and atraumatic.  Right Ear: Tympanic membrane, external ear and ear canal normal.  Left Ear: Tympanic membrane, external ear and ear canal normal.  Nose: Mucosal edema present. Right sinus exhibits no maxillary sinus tenderness and no frontal sinus tenderness. Left sinus exhibits no maxillary sinus tenderness and no frontal sinus tenderness.  Mouth/Throat: Uvula is midline, oropharynx is clear and moist and mucous membranes are normal. No tonsillar exudate.  Nasal mucosal edema.  OP clear without tonsillar  swelling or exudate.  Uvula midline with equal palate rise.  TMs nonerythematous and nonbulging bilaterally.  Eyes: Pupils are equal, round, and reactive to light.  Conjunctivae and EOM are normal.  Neck: Normal range of motion.  Cardiovascular: Normal rate, regular rhythm and intact distal pulses.  Pulmonary/Chest: Effort normal and breath sounds normal. She has no decreased breath sounds. She has no wheezes. She has no rhonchi. She has no rales.  Pt speaking in full sentences without difficulty.  Clear lung sounds in all fields.  Right anterior chest wall port with access.  Abdominal: Soft. She exhibits no distension. There is no tenderness.  Musculoskeletal: Normal range of motion.  Lymphadenopathy:    She has no cervical adenopathy.  Neurological: She is alert and oriented to person, place, and time.  Skin: Skin is warm. Capillary refill takes less than 2 seconds.  Psychiatric: She has a normal mood and affect.  Nursing note and vitals reviewed.    ED Treatments / Results  Labs (all labs ordered are listed, but only abnormal results are displayed) Labs Reviewed - No data to display  EKG None  Radiology Dg Chest 2 View  Result Date: 06/13/2018 CLINICAL DATA:  Productive cough and shortness of breath for the past 4 days. History of hypertension, nonsmoker. EXAM: CHEST - 2 VIEW COMPARISON:  PA and lateral chest x-ray of December 28, 2017 FINDINGS: The lungs are mildly hyperinflated. There is no focal infiltrate. There is no pleural effusion. There is a stable approximately 3 mm diameter calcified nodule in the right upper lobe. The heart and pulmonary vascularity are normal. The mediastinum is normal in width. The trachea is midline. The power port catheter tip projects over the junction of the middle and distal thirds of the SVC. There is mild multilevel degenerative disc disease of the thoracic spine. IMPRESSION: Chronic bronchitic changes. No acute pneumonia nor CHF. Previous  granulomatous infection. Electronically Signed   By: David  Martinique M.D.   On: 06/13/2018 09:31    Procedures Procedures (including critical care time)  Medications Ordered in ED Medications  fluticasone (FLONASE) 50 MCG/ACT nasal spray 1 spray (has no administration in time range)  heparin lock flush 100 unit/mL (500 Units Intracatheter Given 06/13/18 1116)     Initial Impression / Assessment and Plan / ED Course  I have reviewed the triage vital signs and the nursing notes.  Pertinent labs & imaging results that were available during my care of the patient were reviewed by me and considered in my medical decision making (see chart for details).     Patient presenting to get her port de-accessed and for further evaluation of her URI.  Physical exam reassuring, she is afebrile not tachycardic.  Appears nontoxic.  Extensive work-up at [facility] ER yesterday reviewed, no sign of infection.  Patient with a negative CTA yesterday.  Discussed with patient.  Discussed that her symptoms are likely viral, discussed symptomatic treatment.  Patient to continue Chloraseptic spray that she has at home.  Will discharge with Tessalon Perles and Flonase.  Port de-accessed and flushed with heparin.  Discussed with attending, Dr. Rogene Houston agrees to plan.  Patient follow-up with her PCP if symptoms are not improving by the end of the week.  At this time, patient appears safe for discharge.  Return precautions given.  Patient states she understands and agrees to plan.  Final Clinical Impressions(s) / ED Diagnoses   Final diagnoses:  Upper respiratory tract infection, unspecified type  Encounter for care related to vascular access port    ED Discharge Orders         Ordered    benzonatate (TESSALON) 100 MG capsule  Every 8 hours     06/13/18 1131           , , PA-C 06/13/18 1241    Fredia Sorrow, MD 06/18/18 [phone]

## 2018-06-13 NOTE — ED Notes (Signed)
Port flushed with Heparin; and was de-accessed.

## 2018-06-13 NOTE — ED Notes (Signed)
Pt requesting to wait to have blood work after being seen, pt had blood work obtained at [facility] yesterday would like to use that

## 2018-06-16 DIAGNOSIS — J069 Acute upper respiratory infection, unspecified: Secondary | ICD-10-CM | POA: Diagnosis not present

## 2018-06-16 DIAGNOSIS — J3489 Other specified disorders of nose and nasal sinuses: Secondary | ICD-10-CM | POA: Diagnosis not present

## 2018-06-16 DIAGNOSIS — R05 Cough: Secondary | ICD-10-CM | POA: Diagnosis not present

## 2018-06-16 DIAGNOSIS — R0982 Postnasal drip: Secondary | ICD-10-CM | POA: Diagnosis not present

## 2018-06-19 DIAGNOSIS — H0100A Unspecified blepharitis right eye, upper and lower eyelids: Secondary | ICD-10-CM | POA: Diagnosis not present

## 2018-06-21 ENCOUNTER — Telehealth: Payer: Self-pay | Admitting: Internal Medicine

## 2018-06-21 DIAGNOSIS — R160 Hepatomegaly, not elsewhere classified: Secondary | ICD-10-CM | POA: Diagnosis not present

## 2018-06-21 DIAGNOSIS — C9000 Multiple myeloma not having achieved remission: Secondary | ICD-10-CM | POA: Diagnosis not present

## 2018-06-21 DIAGNOSIS — D369 Benign neoplasm, unspecified site: Secondary | ICD-10-CM | POA: Diagnosis not present

## 2018-06-21 DIAGNOSIS — I129 Hypertensive chronic kidney disease with stage 1 through stage 4 chronic kidney disease, or unspecified chronic kidney disease: Secondary | ICD-10-CM | POA: Diagnosis not present

## 2018-06-21 DIAGNOSIS — E785 Hyperlipidemia, unspecified: Secondary | ICD-10-CM | POA: Diagnosis not present

## 2018-06-21 DIAGNOSIS — I639 Cerebral infarction, unspecified: Secondary | ICD-10-CM | POA: Diagnosis not present

## 2018-06-21 DIAGNOSIS — C73 Malignant neoplasm of thyroid gland: Secondary | ICD-10-CM | POA: Diagnosis not present

## 2018-06-21 DIAGNOSIS — C6990 Malignant neoplasm of unspecified site of unspecified eye: Secondary | ICD-10-CM | POA: Diagnosis not present

## 2018-06-21 DIAGNOSIS — E039 Hypothyroidism, unspecified: Secondary | ICD-10-CM | POA: Diagnosis not present

## 2018-06-21 DIAGNOSIS — N182 Chronic kidney disease, stage 2 (mild): Secondary | ICD-10-CM | POA: Diagnosis not present

## 2018-06-21 DIAGNOSIS — R319 Hematuria, unspecified: Secondary | ICD-10-CM | POA: Diagnosis not present

## 2018-06-21 DIAGNOSIS — M545 Low back pain: Secondary | ICD-10-CM | POA: Diagnosis not present

## 2018-06-21 NOTE — Telephone Encounter (Signed)
New message   Patient would like to see if she can get worked into another appt she has an appt on 06/26/2018 but the patient is going to chemotherapy that day and may have to cancel appt. The patient does not want to see a PA and Dr. Harrington Challenger schedule is full. Please advise.

## 2018-06-21 NOTE — Telephone Encounter (Signed)
Pt is simply calling in to reschedule her yearly follow-up appt with Dr Harrington Challenger to any other day but Mondays.  Pt currently has cancer and is now getting chemotherapy at [facility] every Monday.  Pt has an appt with Dr Harrington Challenger scheduled for next Monday 9/23, but this was previously made months ago, and she has had a lot of health changes since then.  Pt states she wants to see Dr Harrington Challenger only.  Cancelled the pts 9/23 appt, and scheduled her in an open slot for next Friday 9/27 at 1140 with Dr Harrington Challenger.  Pt aware to arrive 15 mins prior to this appt.  Pt verbalized understanding and agrees with this plan.  Pt more than gracious for all the assistance provided.

## 2018-06-26 ENCOUNTER — Ambulatory Visit: Payer: Medicare Other | Admitting: Internal Medicine

## 2018-06-29 DIAGNOSIS — I129 Hypertensive chronic kidney disease with stage 1 through stage 4 chronic kidney disease, or unspecified chronic kidney disease: Secondary | ICD-10-CM | POA: Diagnosis not present

## 2018-06-29 DIAGNOSIS — Z23 Encounter for immunization: Secondary | ICD-10-CM | POA: Diagnosis not present

## 2018-06-30 ENCOUNTER — Ambulatory Visit: Payer: Medicare Other | Admitting: Internal Medicine

## 2018-07-03 ENCOUNTER — Encounter: Payer: Self-pay | Admitting: Internal Medicine

## 2018-07-03 ENCOUNTER — Ambulatory Visit (INDEPENDENT_AMBULATORY_CARE_PROVIDER_SITE_OTHER): Payer: Medicare Other | Admitting: Internal Medicine

## 2018-07-03 VITALS — BP 136/76 | HR 74 | Ht 61.0 in | Wt 128.0 lb

## 2018-07-03 DIAGNOSIS — I10 Essential (primary) hypertension: Secondary | ICD-10-CM | POA: Diagnosis not present

## 2018-07-03 DIAGNOSIS — E782 Mixed hyperlipidemia: Secondary | ICD-10-CM

## 2018-07-03 LAB — LIPID PANEL
CHOL/HDL RATIO: 3 ratio (ref 0.0–4.4)
Cholesterol, Total: 172 mg/dL (ref 100–199)
HDL: 58 mg/dL (ref >39)
LDL Calculated: 95 mg/dL (ref 0–99)
Triglycerides: 94 mg/dL (ref 0–149)
VLDL Cholesterol Cal: 19 mg/dL (ref 5–40)

## 2018-07-03 NOTE — Patient Instructions (Addendum)
I called your kidney doctor's office to make them aware you are at our office for a visit. The nurse will see you this morning for blood pressure check, even though your appointment was at 10:00 am.  Your physician recommends that you continue on your current medications as directed. Please refer to the Current Medication list given to you today.  Your physician recommends that you return for lab work today (Lipids).  Your physician wants you to follow-up in: 1 year with Dr. Harrington Challenger.  You will receive a reminder letter in the mail two months in advance. If you don't receive a letter, please call our office to schedule the follow-up appointment.
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
## 2018-07-04 ENCOUNTER — Telehealth: Payer: Self-pay | Admitting: *Deleted

## 2018-07-04 DIAGNOSIS — I129 Hypertensive chronic kidney disease with stage 1 through stage 4 chronic kidney disease, or unspecified chronic kidney disease: Secondary | ICD-10-CM | POA: Diagnosis not present

## 2018-07-04 MED ORDER — SIMVASTATIN 20 MG PO TABS: 20.0000 mg | ORAL_TABLET | Freq: Every evening | ORAL | 3 refills | Status: DC

## 2018-07-04 NOTE — Telephone Encounter (Signed)
Called pt to go over her lab results and she informed me that she had misplaced her Simvastatin and needed a rx sent into her [place]. I have send in for pt.

## 2018-07-08 ENCOUNTER — Other Ambulatory Visit: Payer: Self-pay | Admitting: Hematology and Oncology

## 2018-07-08 DIAGNOSIS — Z7189 Other specified counseling: Secondary | ICD-10-CM

## 2018-07-08 DIAGNOSIS — C9000 Multiple myeloma not having achieved remission: Secondary | ICD-10-CM

## 2018-07-08 NOTE — Progress Notes (Signed)
[facility] day:  07/10/2018  Chief Complaint: [name] is a 77 y.o. female with multiple myeloma who is referred by Dr. Adriana Simas for new patient assessment.  HPI:  The patient was diagnosed with a monoclonal gammopathy of unknown significance (MGUS) in 01/2008 by Dr. Jana Hakim.  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  She was noted to have a 0.8 g/dL IgG lambda monoclonal protein.  IgG was 1374, IgA 46 and IgM 39.  Skeletal survey revealed no evidence of lytic bone disease.  Hemoglobin, calcium, and renal function were normal.  She was followed expectantly.   The patient was seen at the [facility] on 08/24/2012.  M-spike was 1.8 gm/dL.  IgG was 1722.   Lambda free light chains were 33.5 mg/dL with a ratio of 0.00. Hemoglobin, creatinine and calcium were normal.  Skeletal survey revealed no evidence of osteopenia or lytic bone disease.   Bone marrow aspirate and biopsy on 08/24/2012 revealed a cellularity 40% with 22% plasma cells by aspirate differential, 20% by CD138 immunohistochemistry. The plasma cells were lambda light chain restricted.  Cytogenetics were normal.  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 There was no IGH rearrangement, 17 P deletion or abnormalities involving chromosome 1. PET scan in 09/2012 revealed no evidence of FDG avid bone lesions.   She continued expectant management.  Over time, the patient's myeloma parameters continued to gradually increase. At [facility], on 08/24/2013,  M-spike was 2.1 with an IgG 2442.  Free lambda light chains were 62.73 mg/dL (ratio 0.00).  Calcium was 10.3 mg/dL. CBC and renal function remained normal.  Given the continued increase in her parameters over time increased FLC ratio of over 100, a recommendation was made to institute therapy with lenalidomide and dexamethasone.   The patient sought additional opinions on this issue over time. She was  seen by Dr. Wendee Beavers at [facility] (? [facility]). Decision was made to institute therapy in early 2015. She began lenalidomide and dexamethasone beginning on ~12/05/2013.  Per the patient's report, she received ~1 cycle of therapy. Treatment was stopped out of concerns that the lenalidomide was causing angioedema.  Therapy was discontinued.  She started bortezomib and dexamethasone on 01/28/2014. Bortezomib was given at 1.3 mg/m2 on days 1, 8 and 15 of a 28-day cycle; Decadron 20 mg days 1, 8, 15 and 22.  Decadron was reduced to 10 mg on 06/03/2014. On 08/19/2014, she began receiving Decadron 4 mg daily for 5 days each week. In 10/2014, Decadron was discontinued.  Best response to date: MR by SPEP (M-spike 2.1 g/dL prior to treatment; lambda SFLC 62.73 mg/dL), PR by serum free light chains.   She began bortizomib, daratumumab, and Decadron on 07/26/2017 with planned end date 08/30/2018.  She has received Zoledronic acid beginning 01/28/2014 every 4 weeks.  She no longer receives Zometa.  She was last seen at [facility] by Dr. Adriana Simas on 06/12/2018.  She was noted to have headaches post daratumumab that would linger for 2 days.  Tylenol helped with symptoms.  She had apparently run out of her anti-hypertensive medications.  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  At that time, she was currently on cycle #9 DVd.  Velcade was dropped with the plan to add back Velcade if response was lost.  Plan was to continue daratumumab monotherapy monthly.  Notes  indicate that she has had difficulty getting back and forth to [facility]. She lives in [place].  Labs on 06/12/2018 revealed a hematocrit of 37.6, hemoglobin 12.5, MCV 90, platelets 249,000, WBC 6700 with an ANC of 4200.  Creatinine was 0.74, calcium 9.7, and albumin 3.9.  LFTs were normal.  M-spike was 1.9.  IgG was 2101.  Patient presents today stating that she has been "doctoring  on her port" in order to be ready to start treatments here at [place]. Patient "lives on EMLA cream". She states, "I start putting it on my port 2-3 days before my treatment. I want to make sure it is good and numb".   Symptomatically, patient is having issues with her memory. She is asking for provider to give her something for her memory citing the fact that her memory changes "scare her". Patient describes drenching nocturnal diaphoresis. Patient has to change clothing and linens at night. She has frequent nausea. She has lost 10-15 pounds over the course of the last few months. She has a cough at night, which is improved with the use of Delsym.   Patient has frequent diarrhea. She notes that her diarrhea is based on what she eats. She is unable to recall her last colonoscopy. Patient denies bleeding; no hematochezia, melena, or gross hematuria. Patient denies urinary symptoms. She complains of arthritic pain and neuropathy in her RIGHT hand only. She denies significant balance or coordination issues.   Patient has a "spider bite" to her mid-tib/fib area. She notes that a spider bit her "months ago". She is requesting an xray to make sure that "the infection" didn't go to her bone. She complains of residual tenderness, with no drainage or signs of infections.   Patient's father, sister, brother, and niece all died from unknown cancers.    Past Medical History:  Diagnosis Date  . Anxiety   . Arthritis   . Asthma   . Cancer (HCC)    THYROID, SKIN, NOSE, BONE  . Chronic kidney disease   . Diverticulosis    Pt reported on 03/15/12  . Eye cancer [facility])    R eye cancer - 3-4 y ago  . Eye problems    LOST VISION RIGHT EYE  . Hernia   . History of blood clots    LEG  . Hyperlipidemia   . Hypertension   . Melanoma ([place])   . Melanoma ([place]) 1989   chest  . MGUS (monoclonal gammopathy of unknown significance)   . MGUS (monoclonal gammopathy of unknown significance)   . Mitral valve prolapse   .  Perforated bowel ([place])   . Peritonitis ([place])   . Smoldering multiple myeloma ([place])   . Stroke ([place])   . Thyroid disease     Past Surgical History:  Procedure Laterality Date  . ABDOMINAL HYSTERECTOMY    . BREAST LUMPECTOMY  12/2010   R breast  . BREAST LUMPECTOMY  2004   L axilla neg for cancer.  [name] CARDIOVASCULAR STRESS TEST  2006   NORMAL  . HERNIA REPAIR    . LEG SURGERY     VEIN & BLOOD CLOT  . MELANOMA EXCISION     R eye  . ROTATOR CUFF REPAIR  2004   RIGHT SHOULDER  . SKIN CANCER EXCISION  2011, 2013   squamous cell of nose x 2  . TRANSTHORACIC ECHOCARDIOGRAM  2006    Family History  Problem Relation Age of Onset  . Asthma Father   . Diabetes Father   .  Heart failure Father   . Cancer Father        Prostate and kidney cancer  . Emphysema Father   . Diabetes Sister   . Cancer Sister   . Stroke Mother 34       Her mother passed away from this stroke at the age of 8  . Cancer Brother        brain cancer & prostate cancer  . Cervical cancer Sister   . Cancer Sister        Recurrent cancer involving the neck and the jaw    Social History:  reports that she is a non-smoker but has been exposed to tobacco smoke. She has never used smokeless tobacco. She reports that she does not drink alcohol or use drugs.  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 She ultimately retired from the [employer]. Patient denies known exposures to radiation on toxins.  She lives alone in [place].  The patient is accompanied by Pati Gallo (friend from church) today.  Allergies:  Allergies  Allergen Reactions  . Aldactone [Spironolactone] Palpitations    Burning in stomach Burning in stomach Burning in stomach  . Atorvastatin Other (See Comments) and Hives    Patient stated legs hurt so bad she could hardly walk  Patient stated legs hurt so bad she could hardly walk  Patient stated legs hurt so bad she could hardly walk  Patient stated legs hurt so bad she  could hardly walk  Patient stated legs hurt so bad she could hardly walk  Patient stated legs hurt so bad she could hardly walk    . Decadrol [Dexamethasone] Anaphylaxis and Palpitations    Other reaction(s): Unknown  . Doxycycline Other (See Comments)    SEVERE HEADACHES SEVERE HEADACHES  . Fluconazole Swelling    Facial swelling  . Losartan Swelling  . Quinolones Anxiety, Hives and Other (See Comments)    Leg Pain Leg Pain   . Revlimid [Lenalidomide]     Angioedema  . Sertraline Hcl Palpitations  . Hydrocodone Rash and Other (See Comments)  . Levofloxacin Hives    Leg Pain  . Prednisolone Hives and Other (See Comments)    Headache, left eye swelling, forehead swelling Headache, left eye swelling, forehead swelling  . Sertraline Anxiety and Rash  . Tramadol Rash and Other (See Comments)    Headaches  Headaches  Headaches  Headaches  Headaches  Headaches   . Levofloxacin Other (See Comments)    Severe Myalgias  . Prednisone Other (See Comments)  . Pseudoephedrine     stroke  . Pseudoephedrine Hcl Other (See Comments)    Pt states she had a stroke while taking sudafed  . Sertraline Hcl   . Sudafed [Pseudoephedrine Hcl]   . Trazodone And Nefazodone     Side affects  . Trazodone Hcl Other (See Comments)    Side affects Side affects  Side affects  . Hydrocodone-Acetaminophen Anxiety  . Latex Rash  . Tape Rash    Current Medications: Current Outpatient Medications  Medication Sig Dispense Refill  . ALPRAZolam (XANAX) 0.5 MG tablet Take 0.5 mg by mouth 2 (two) times daily as needed for anxiety or sleep.     [name] amLODipine (NORVASC) 10 MG tablet Take 10 mg by mouth daily.    [name] aspirin 81 MG tablet Chew 81 mg by mouth at bedtime.     [name] azelastine (ASTELIN) 0.1 % nasal spray Place 1 spray into both nostrils daily as needed  for allergies.     . carboxymethylcellulose (REFRESH TEARS) 0.5 % SOLN Place 1 drop into both eyes 3 (three) times daily as needed (for  allergies).    . clonazePAM (KLONOPIN) 1 MG tablet Take 1 mg by mouth at bedtime.    . fexofenadine (ALLEGRA) 180 MG tablet Take 180 mg by mouth daily.     [name] guaiFENesin-dextromethorphan (ROBITUSSIN DM) 100-10 MG/5ML syrup Take 5 mLs by mouth every 4 (four) hours as needed for cough. 118 mL 0  . levothyroxine (SYNTHROID, LEVOTHROID) 137 MCG tablet Take 137 mcg by mouth daily before breakfast.     . MICARDIS 40 MG tablet Take 1 tablet (40 mg total) by mouth daily. 90 tablet 3  . Polyethyl Glycol-Propyl Glycol (SYSTANE) 0.4-0.3 % SOLN Place 1 drop into both eyes 2 (two) times daily as needed (for allergies).    . simvastatin (ZOCOR) 20 MG tablet Take 1 tablet (20 mg total) by mouth every evening. 90 tablet 3  . acetaminophen (TYLENOL) 500 MG tablet Take 250-500 mg by mouth every 6 (six) hours as needed for mild pain, moderate pain, fever or headache.     [name] acyclovir (ZOVIRAX) 400 MG tablet Take 1 tablet (400 mg total) by mouth 2 (two) times daily. (Patient not taking: Reported on 07/10/2018) 60 tablet 11  . fluticasone (FLONASE) 50 MCG/ACT nasal spray Place 1 spray into both nostrils 2 (two) times daily. (Patient not taking: Reported on 07/10/2018) 16 g 11  . loperamide (IMODIUM A-D) 2 MG tablet Take 2-4 mg by mouth 4 (four) times daily as needed for diarrhea or loose stools.    . prochlorperazine (COMPAZINE) 10 MG tablet Take 1 tablet (10 mg total) by mouth every 6 (six) hours as needed (Nausea or vomiting). (Patient not taking: Reported on 07/10/2018) 30 tablet 1   No current facility-administered medications for this visit.    Facility-Administered Medications Ordered in Other Visits  Medication Dose Route Frequency Provider Last Rate Last Dose  . heparin lock flush 100 unit/mL  500 Units Intravenous Once Brunetta Genera, MD      . sodium chloride flush (NS) 0.9 % injection 10 mL  10 mL Intravenous Once Brunetta Genera, MD        Review of Systems:  GENERAL:  Feels "ok".  No fevers.   Sweats at night.  Weight loss of 18 pounds in the past few months. PERFORMANCE STATUS (ECOG):  1 HEENT:  RIGHT eye; vision loss secondary to cancer.  Dry eye.  Runny nose.  No visual changes, sore throat, mouth sores or tenderness. Lungs: No shortness of breath.  Cough, suing Delsym.  .  No hemoptysis. Cardiac:  No chest pain, palpitations, orthopnea, or PND.  Blood pressure medications increase recently. GI:  Diarrhea.  No nausea, vomiting, constipation, melena or hematochezia.  Not up-to-date on colonoscopy. GU:  No urgency, frequency, dysuria, or hematuria. Musculoskeletal:  No back pain.  Arthritis in right hand.  No muscle tenderness. Extremities:  No pain or swelling. Skin:  Concern about old spider bite.  No rashes or skin changes. Neuro:  Memory problems.  No headache, numbness or weakness, balance or coordination issues. Endocrine:  No diabetes.  Thyroid issues on Synthroid.  No hot flashes or night sweats. Psych:  No mood changes, depression or anxiety. Pain:  No focal pain. Review of systems:  All other systems reviewed and found to be negative.  Physical Exam: Blood pressure (!) 154/84, pulse 79, temperature 98.4 F (36.9 C), temperature  source Tympanic, resp. rate 18, height 5' 1"  (1.549 m), weight 128 lb 11.2 oz (58.4 kg). GENERAL:  Well developed, well nourished, woman sitting comfortably in the exam room in no acute distress. MENTAL STATUS:  Alert and oriented to person, place and time. HEAD:  Blonde curly hair.  Normocephalic, atraumatic, face symmetric, no Cushingoid features. EYES:  Blue eyes.  Pupils equal round and reactive to light and accomodation.  No conjunctivitis or scleral icterus. ENT:  Oropharynx clear without lesion.  Tongue normal. Mucous membranes moist.  RESPIRATORY:  Clear to auscultation without rales, wheezes or rhonchi. CARDIOVASCULAR:  Regular rate and rhythm without murmur, rub or gallop. ABDOMEN:  Soft, non-tender, with active bowel sounds, and no  hepatosplenomegaly.  No masses. SKIN:  Minimal scarring left lower leg at site of old spider bite.  No erythema or increased warmth.  No rashes, ulcers or lesions. EXTREMITIES: No edema, no skin discoloration or tenderness.  No palpable cords. LYMPH NODES: No palpable cervical, supraclavicular, axillary or inguinal adenopathy  NEUROLOGICAL: Unremarkable. PSYCH:  Appropriate.   No visits with results within 3 Day(s) from this visit.  Latest known visit with results is:  Office Visit on 07/03/2018  Component Date Value Ref Range Status  . Cholesterol, Total 07/03/2018 172  100 - 199 mg/dL Final  . Triglycerides 07/03/2018 94  0 - 149 mg/dL Final  . HDL 07/03/2018 58  >39 mg/dL Final  . VLDL Cholesterol Cal 07/03/2018 19  5 - 40 mg/dL Final  . LDL Calculated 07/03/2018 95  0 - 99 mg/dL Final  . Chol/HDL Ratio 07/03/2018 3.0  0.0 - 4.4 ratio Final   Comment:                                   T. Chol/HDL Ratio                                             Men  Women                               1/2 Avg.Risk  3.4    3.3                                   Avg.Risk  5.0    4.4                                2X Avg.Risk  9.6    7.1                                3X Avg.Risk 23.4   11.0     Assessment:  [name] is a 77 y.o. female with IgG lambda multiple myeloma.  She presented in 01/2008 with MGUS.  She required treatment in 12/2013.  She is currently on single [name] daratumumab.  Bone marrow aspirate and biopsy on 08/24/2012 revealed a cellularity 40% with 22% plasma cells by aspirate differential, 20% by CD138 immunohistochemistry. The plasma cells were lambda light chain restricted.  Cytogenetics were normal.  FISH revealed  extra copies of chromosomes 4, 11, 13, and 17. There was no IGH rearrangement, 17 P deletion or abnormalities involving chromosome 1.   PET scan in 09/2012 revealed no evidence of FDG avid bone lesions.   SPEP has been followed:  2.1 on 08/24/2013, 1.6 on  06/14/2016, 1.6 on 10/05/2016, 2.0 on 03/21/2017, 2.1 on 05/31/2018, 1.8 on 04/03/2018, 1.8 on 05/08/2018, and 1.9 on 06/12/2018.  She began lenalidomide and dexamethasone beginning on ~12/05/2013.  She received ~1 cycle of therapy. Treatment was stopped secondary to angioedema felt due to lenalidomide.  She began bortezomib and dexamethasone on 01/28/2014.  She began bortizomib, daratumumab, and Decadron on 07/26/2017.  She received 9 cycles.  Velcade was dropped.  She receives daratumumab alone (last 06/12/2018).  She received Zometa beginning 01/28/2014 every 4 weeks.  She no longer receives Zometa.  Symptomatically, she has issues with her memory. She notes sweats and weight loss.  Exam reveals no adenopathy or hepatosplenomegaly.   Plan: 1.  Discuss diagnosis and management of multiple myeloma. 2.  Multiple myeloma:  Continue single [name] daratumumab.  Confirm premedications with [facility].  Follow-up regarding bisphosphonates- patient no longer receiving Zometa. 3.  Weight loss:  History is uncertain. Follow-up with [facility] regarding old weights.  Patient notes that she is late for her colonoscopy.  Discuss mammogram at next visit.  If weight loss documented and persistent, consider imaging. 4.  RTC on 07/17/2018 for MD assessment, labs (CBC with diff, CMP, Mg, SPEP, FLCA, LDH, beta 2-microglobulins), and continuation of daratumumab   Honor Loh, NP  07/10/2018, 4:03 PM   I saw and evaluated the patient, participating in the key portions of the service and reviewing pertinent diagnostic studies and records.  I reviewed the nurse practitioner's note and agree with the findings and the plan.  The assessment and plan were discussed with the patient.  Multiple questions were asked by the patient and answered.   Nolon Stalls, MD 07/10/2018, 4:03 PM

## 2018-07-10 ENCOUNTER — Other Ambulatory Visit: Payer: Self-pay

## 2018-07-10 ENCOUNTER — Inpatient Hospital Stay: Payer: Medicare Other | Attending: Hematology and Oncology | Admitting: Hematology and Oncology

## 2018-07-10 ENCOUNTER — Encounter: Payer: Self-pay | Admitting: Hematology and Oncology

## 2018-07-10 VITALS — BP 154/84 | HR 79 | Temp 98.4°F | Resp 18 | Ht 61.0 in | Wt 128.7 lb

## 2018-07-10 DIAGNOSIS — C9000 Multiple myeloma not having achieved remission: Secondary | ICD-10-CM | POA: Diagnosis not present

## 2018-07-10 DIAGNOSIS — Z5112 Encounter for antineoplastic immunotherapy: Secondary | ICD-10-CM | POA: Diagnosis not present

## 2018-07-10 DIAGNOSIS — Z86718 Personal history of other venous thrombosis and embolism: Secondary | ICD-10-CM | POA: Diagnosis not present

## 2018-07-10 DIAGNOSIS — N189 Chronic kidney disease, unspecified: Secondary | ICD-10-CM | POA: Insufficient documentation

## 2018-07-10 DIAGNOSIS — J45909 Unspecified asthma, uncomplicated: Secondary | ICD-10-CM | POA: Diagnosis not present

## 2018-07-10 DIAGNOSIS — E785 Hyperlipidemia, unspecified: Secondary | ICD-10-CM | POA: Insufficient documentation

## 2018-07-10 DIAGNOSIS — F419 Anxiety disorder, unspecified: Secondary | ICD-10-CM | POA: Insufficient documentation

## 2018-07-10 DIAGNOSIS — Z79899 Other long term (current) drug therapy: Secondary | ICD-10-CM | POA: Insufficient documentation

## 2018-07-10 DIAGNOSIS — R634 Abnormal weight loss: Secondary | ICD-10-CM | POA: Insufficient documentation

## 2018-07-10 DIAGNOSIS — Z85820 Personal history of malignant melanoma of skin: Secondary | ICD-10-CM | POA: Insufficient documentation

## 2018-07-10 DIAGNOSIS — Z7982 Long term (current) use of aspirin: Secondary | ICD-10-CM | POA: Diagnosis not present

## 2018-07-10 DIAGNOSIS — I129 Hypertensive chronic kidney disease with stage 1 through stage 4 chronic kidney disease, or unspecified chronic kidney disease: Secondary | ICD-10-CM | POA: Diagnosis not present

## 2018-07-10 DIAGNOSIS — M1990 Unspecified osteoarthritis, unspecified site: Secondary | ICD-10-CM | POA: Diagnosis not present

## 2018-07-10 NOTE — Progress Notes (Signed)
DISCONTINUE ON PATHWAY REGIMEN - Multiple Myeloma and Other Plasma Cell Dyscrasias     A cycle is every 21 days (cycles 1-8) and every 28 days (cycle 9 and beyond):     Daratumumab      Bortezomib      Dexamethasone   **Always confirm dose/schedule in your pharmacy ordering system**  REASON: Other Reason PRIOR TREATMENT: [id number]: DaraVd (Daratumumab 16 mg/kg + Bortezomib 1.3 mg/m2 Subcut D1, 4, 8, 11 + Dexamethasone 20 mg) TREATMENT RESPONSE: Unable to Evaluate  START ON PATHWAY REGIMEN - Multiple Myeloma and Other Plasma Cell Dyscrasias     A cycle is every 28 days:     Daratumumab      Daratumumab      Daratumumab   **Always confirm dose/schedule in your pharmacy ordering system**  Patient Characteristics: Relapsed / Refractory, All Lines of Therapy R-ISS Staging: Unknown Disease Classification: Refractory Line of Therapy: Third Line Intent of Therapy: Non-Curative / Palliative Intent, Discussed with Patient

## 2018-07-10 NOTE — Progress Notes (Signed)
Here for new pt evaluation. Here w friend. Pt stated  She used to go to [facility] -transferring care here she stated.

## 2018-07-11 ENCOUNTER — Other Ambulatory Visit: Payer: Self-pay

## 2018-07-11 DIAGNOSIS — D472 Monoclonal gammopathy: Secondary | ICD-10-CM

## 2018-07-11 MED ORDER — ALPRAZOLAM 0.5 MG PO TABS: ORAL_TABLET | ORAL | 0 refills | Status: DC

## 2018-07-12 DIAGNOSIS — H0015 Chalazion left lower eyelid: Secondary | ICD-10-CM | POA: Diagnosis not present

## 2018-07-15 DIAGNOSIS — R634 Abnormal weight loss: Secondary | ICD-10-CM | POA: Insufficient documentation

## 2018-07-16 NOTE — Progress Notes (Signed)
[facility] day:  07/17/2018  Chief Complaint: [name] is a 77 y.o. female with multiple myeloma who is seen for assessment prior to resumption of daratumumab with [facility] (transferred care from [facility]).   HPI:  The patient was last seen in the medical oncology clinic on 07/10/2018 for an initial consult. At that time, patient described problems with her memory. She was experiencing episodes of drenching diaphoresis, frequent nausea and diarrhea, and she was losing weight (10-15 pounds over the last few months). She noted a chronic cough at night. Patient had been "doctoring on her port" in order to prepare for resumption of treatment. She described "living on EMLA", whereby she starts applying 2-3 days prior to port being accessed. Exam was grossly unremarkable.   Call placed to [facility] cancer center for a telephone consult with Eli Phillips, AGNP on 07/11/2018 to discuss patient's treatment regimen. She began bortezomib + dexamethasone on 01/28/2014. DVd regimen started on 07/26/2017, of which patient received 9 cycles. Bortezomib was subsequently discontinued. Patient last treated on 06/12/2018 with single agent daratumumab.   In the interim, patient presents this morning with a LEFT frontal headache this morning. No other associated symptoms. She attributes symptoms to anxiety related to upcoming treatment. Patient denies that she has experienced any B symptoms. She denies any interval infections. She has a persistent cough at night and in the early morning. She is using Delsym. Previously reported diarrhea has resolved.   Patient advises that she maintains an adequate appetite. She is eating well. Weight today is 126 lb (57.2 kg), which compared to her last visit to the clinic, represents a 2 pound decrease.     Patient complains of pain rated 6/10 (headache) in the clinic today.   Past Medical History:  Diagnosis Date  . Anxiety   .  Arthritis   . Asthma   . Cancer (HCC)    THYROID, SKIN, NOSE, BONE  . Chronic kidney disease   . Diverticulosis    Pt reported on 03/15/12  . Eye cancer [facility])    R eye cancer - 3-4 y ago  . Eye problems    LOST VISION RIGHT EYE  . Hernia   . History of blood clots    LEG  . Hyperlipidemia   . Hypertension   . Melanoma ([place])   . Melanoma ([place]) 1989   chest  . MGUS (monoclonal gammopathy of unknown significance)   . MGUS (monoclonal gammopathy of unknown significance)   . Mitral valve prolapse   . Perforated bowel ([place])   . Peritonitis ([place])   . Smoldering multiple myeloma ([place])   . Stroke ([place])   . Thyroid disease     Past Surgical History:  Procedure Laterality Date  . ABDOMINAL HYSTERECTOMY    . BREAST LUMPECTOMY  12/2010   R breast  . BREAST LUMPECTOMY  2004   L axilla neg for cancer.  [name] CARDIOVASCULAR STRESS TEST  2006   NORMAL  . HERNIA REPAIR    . LEG SURGERY     VEIN & BLOOD CLOT  . MELANOMA EXCISION     R eye  . ROTATOR CUFF REPAIR  2004   RIGHT SHOULDER  . SKIN CANCER EXCISION  2011, 2013   squamous cell of nose x 2  . TRANSTHORACIC ECHOCARDIOGRAM  2006    Family History  Problem Relation Age of Onset  . Asthma Father   . Diabetes Father   . Heart  failure Father   . Cancer Father        Prostate and kidney cancer  . Emphysema Father   . Diabetes Sister   . Cancer Sister   . Stroke Mother 43       Her mother passed away from this stroke at the age of 79  . Cancer Brother        brain cancer & prostate cancer  . Cervical cancer Sister   . Cancer Sister        Recurrent cancer involving the neck and the jaw    Social History:  reports that she is a non-smoker but has been exposed to tobacco smoke. She has never used smokeless tobacco. She reports that she does not drink alcohol or use drugs.  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 She ultimately retired from the [employer]. Patient denies known exposures to radiation on  toxins.  She lives alone in [place].  The patient is alone today.  Allergies:  Allergies  Allergen Reactions  . Aldactone [Spironolactone] Palpitations    Burning in stomach Burning in stomach Burning in stomach  . Atorvastatin Other (See Comments) and Hives    Patient stated legs hurt so bad she could hardly walk  Patient stated legs hurt so bad she could hardly walk  Patient stated legs hurt so bad she could hardly walk  Patient stated legs hurt so bad she could hardly walk  Patient stated legs hurt so bad she could hardly walk  Patient stated legs hurt so bad she could hardly walk    . Decadrol [Dexamethasone] Anaphylaxis and Palpitations    Other reaction(s): Unknown  . Doxycycline Other (See Comments)    SEVERE HEADACHES SEVERE HEADACHES  . Fluconazole Swelling    Facial swelling  . Losartan Swelling  . Quinolones Anxiety, Hives and Other (See Comments)    Leg Pain Leg Pain   . Revlimid [Lenalidomide]     Angioedema  . Sertraline Hcl Palpitations  . Hydrocodone Rash and Other (See Comments)  . Levofloxacin Hives    Leg Pain  . Prednisolone Hives and Other (See Comments)    Headache, left eye swelling, forehead swelling Headache, left eye swelling, forehead swelling  . Sertraline Anxiety and Rash  . Tramadol Rash and Other (See Comments)    Headaches  Headaches  Headaches  Headaches  Headaches  Headaches   . Levofloxacin Other (See Comments)    Severe Myalgias  . Prednisone Other (See Comments)  . Pseudoephedrine     stroke  . Pseudoephedrine Hcl Other (See Comments)    Pt states she had a stroke while taking sudafed  . Sertraline Hcl   . Sudafed [Pseudoephedrine Hcl]   . Trazodone And Nefazodone     Side affects  . Trazodone Hcl Other (See Comments)    Side affects Side affects  Side affects  . Hydrocodone-Acetaminophen Anxiety  . Latex Rash  . Tape Rash    Current Medications: Current Outpatient Medications  Medication Sig Dispense Refill   . ALPRAZolam (XANAX) 0.5 MG tablet Take 1/2-1 tablet up to three times a day as needed for anxiety. 60 tablet 0  . amLODipine (NORVASC) 10 MG tablet Take 10 mg by mouth daily.    [name] aspirin 81 MG tablet Chew 81 mg by mouth at bedtime.     [name] azelastine (ASTELIN) 0.1 % nasal spray Place 1 spray into both nostrils daily as needed for allergies.     . carboxymethylcellulose (  REFRESH TEARS) 0.5 % SOLN Place 1 drop into both eyes 3 (three) times daily as needed (for allergies).    . clonazePAM (KLONOPIN) 1 MG tablet Take 1 mg by mouth at bedtime.    . fexofenadine (ALLEGRA) 180 MG tablet Take 180 mg by mouth daily.     . fluticasone (FLONASE) 50 MCG/ACT nasal spray Place 1 spray into both nostrils 2 (two) times daily. 16 g 11  . guaiFENesin-dextromethorphan (ROBITUSSIN DM) 100-10 MG/5ML syrup Take 5 mLs by mouth every 4 (four) hours as needed for cough. 118 mL 0  . levothyroxine (SYNTHROID, LEVOTHROID) 137 MCG tablet Take 137 mcg by mouth daily before breakfast.     . Polyethyl Glycol-Propyl Glycol (SYSTANE) 0.4-0.3 % SOLN Place 1 drop into both eyes 2 (two) times daily as needed (for allergies).    . simvastatin (ZOCOR) 20 MG tablet Take 1 tablet (20 mg total) by mouth every evening. 90 tablet 3  . acetaminophen (TYLENOL) 500 MG tablet Take 250-500 mg by mouth every 6 (six) hours as needed for mild pain, moderate pain, fever or headache.     . loperamide (IMODIUM A-D) 2 MG tablet Take 2-4 mg by mouth 4 (four) times daily as needed for diarrhea or loose stools.    [name] MICARDIS 40 MG tablet Take 1 tablet (40 mg total) by mouth daily. (Patient not taking: Reported on 07/17/2018) 90 tablet 3  . neomycin-polymyxin b-dexamethasone (MAXITROL) 3.5-10000-0.1 OINT APPLY TO LEFT EYE 2 TIMES DAILY  0   No current facility-administered medications for this visit.    Facility-Administered Medications Ordered in Other Visits  Medication Dose Route Frequency Provider Last Rate Last Dose  . heparin lock flush 100  unit/mL  500 Units Intravenous Once Brunetta Genera, MD      . heparin lock flush 100 unit/mL  500 Units Intravenous Once Corcoran, Melissa C, MD      . sodium chloride flush (NS) 0.9 % injection 10 mL  10 mL Intravenous Once Brunetta Genera, MD         Review of Systems  Constitutional: Positive for weight loss (down 2 pounds). Negative for diaphoresis, fever and malaise/fatigue.  HENT: Negative.  Negative for congestion, ear discharge, ear pain, nosebleeds, sinus pain, sore throat and tinnitus.        Chronic rhinorrhea  Eyes: Negative.  Negative for blurred vision, double vision, photophobia, pain and discharge.       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   Respiratory: Positive for cough (at night; chronically uses Delsym). Negative for hemoptysis, sputum production and shortness of breath.   Cardiovascular: Negative.  Negative for chest pain, palpitations, orthopnea, leg swelling and PND.  Gastrointestinal: Positive for diarrhea (chronic; improved). Negative for abdominal pain, blood in stool, constipation, melena, nausea and vomiting.  Genitourinary: Negative.  Negative for dysuria, frequency, hematuria and urgency.  Musculoskeletal: Positive for joint pain (OA). Negative for back pain, falls and myalgias.  Skin: Negative for itching and rash.  Neurological: Positive for headaches. Negative for dizziness, tremors, sensory change, speech change, focal weakness and weakness.  Endo/Heme/Allergies: Negative.  Does not bruise/bleed easily.       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 Negative for depression and suicidal ideas. The patient is nervous/anxious. The patient does not have insomnia.   All other systems reviewed and are negative.  Performance status (ECOG): 1 - Symptomatic but completely ambulatory  Vital Signs BP (!) 157/93 (BP Location: Left Arm, Patient  Position: Sitting)   Pulse 80   Temp 98.1 F (36.7  C) (Tympanic)   Resp 18   Wt 126 lb (57.2 kg)   BMI 23.81 kg/m   Physical Exam  Constitutional: She is oriented to person, place, and time and well-developed, well-nourished, and in no distress.  HENT:  Head: Normocephalic and atraumatic.  Mouth/Throat: Oropharynx is clear and moist and mucous membranes are normal.  Styled blonde hair  Eyes: Pupils are equal, round, and reactive to light. EOM are normal. No scleral icterus.  Blue eyes  Neck: Normal range of motion. Neck supple. No tracheal deviation present. No thyromegaly present.  Cardiovascular: Normal rate, regular rhythm, normal heart sounds and intact distal pulses. Exam reveals no gallop and no friction rub.  No murmur heard. Pulmonary/Chest: Effort normal and breath sounds normal. No respiratory distress. She has no wheezes. She has no rales.  Abdominal: Soft. Bowel sounds are normal. She exhibits no distension. There is no tenderness.  Musculoskeletal: Normal range of motion. She exhibits no edema or tenderness.  Lymphadenopathy:    She has no cervical adenopathy.    She has no axillary adenopathy.       Right: No inguinal and no supraclavicular adenopathy present.       Left: No inguinal and no supraclavicular adenopathy present.  Neurological: She is alert and oriented to person, place, and time.  Skin: Skin is warm and dry. No rash noted. No erythema.  Old scarring (minimal) to LEFT distal tib/fib area from old "spider bite".   Psychiatric: Mood, affect and judgment normal.  Nursing note and vitals reviewed.   Infusion on 07/17/2018  Component Date Value Ref Range Status  . LDH 07/17/2018 124  98 - 192 U/L Final   Performed at [facility], [address] [place], [place] [address]  . Magnesium 07/17/2018 2.2  1.7 - 2.4 mg/dL Final   Performed at [facility], [address], [place], [place] [address]  . Sodium 07/17/2018 141  135 - 145 mmol/L Final  . Potassium 07/17/2018 3.5  3.5 - 5.1 mmol/L  Final  . Chloride 07/17/2018 105  98 - 111 mmol/L Final  . CO2 07/17/2018 27  22 - 32 mmol/L Final  . Glucose, Bld 07/17/2018 114* 70 - 99 mg/dL Final  . BUN 07/17/2018 12  8 - 23 mg/dL Final  . Creatinine, Ser 07/17/2018 0.81  0.44 - 1.00 mg/dL Final  . Calcium 07/17/2018 9.7  8.9 - 10.3 mg/dL Final  . Total Protein 07/17/2018 8.0  6.5 - 8.1 g/dL Final  . Albumin 07/17/2018 4.0  3.5 - 5.0 g/dL Final  . AST 07/17/2018 23  15 - 41 U/L Final  . ALT 07/17/2018 13  0 - 44 U/L Final  . Alkaline Phosphatase 07/17/2018 35* 38 - 126 U/L Final  . Total Bilirubin 07/17/2018 1.1  0.3 - 1.2 mg/dL Final  . GFR calc non Af Amer 07/17/2018 >60  >60 mL/min Final  . GFR calc Af Amer 07/17/2018 >60  >60 mL/min Final   Comment: (NOTE) The eGFR has been calculated using the CKD EPI equation. This calculation has not been validated in all clinical situations. eGFR's persistently <60 mL/min signify possible Chronic Kidney Disease.   Georgiann Hahn gap 07/17/2018 9  5 - 15 Final   Performed at [facility], [address] [place], [place] [address]  . WBC 07/17/2018 11.3* 4.0 - 10.5 K/uL Final  . RBC 07/17/2018 4.23  3.87 - 5.11 MIL/uL  Final  . Hemoglobin 07/17/2018 12.6  12.0 - 15.0 g/dL Final  . HCT 07/17/2018 38.2  36.0 - 46.0 % Final  . MCV 07/17/2018 90.3  80.0 - 100.0 fL Final  . MCH 07/17/2018 29.8  26.0 - 34.0 pg Final  . MCHC 07/17/2018 33.0  30.0 - 36.0 g/dL Final  . RDW 07/17/2018 12.8  11.5 - 15.5 % Final  . Platelets 07/17/2018 265  150 - 400 K/uL Final  . nRBC 07/17/2018 0.0  0.0 - 0.2 % Final  . Neutrophils Relative % 07/17/2018 78  % Final  . Neutro Abs 07/17/2018 8.8* 1.7 - 7.7 K/uL Final  . Lymphocytes Relative 07/17/2018 14  % Final  . Lymphs Abs 07/17/2018 1.6  0.7 - 4.0 K/uL Final  . Monocytes Relative 07/17/2018 7  % Final  . Monocytes Absolute 07/17/2018 0.8  0.1 - 1.0 K/uL Final  . Eosinophils Relative 07/17/2018 1  % Final  . Eosinophils Absolute 07/17/2018 0.1  0.0 - 0.5  K/uL Final  . Basophils Relative 07/17/2018 0  % Final  . Basophils Absolute 07/17/2018 0.0  0.0 - 0.1 K/uL Final  . Immature Granulocytes 07/17/2018 0  % Final  . Abs Immature Granulocytes 07/17/2018 0.03  0.00 - 0.07 K/uL Final   Performed at [facility], [address], [place], [place] [address]    Assessment:  ANDREA FERRER is a 77 y.o. female with IgG lambda multiple myeloma.  She presented in 01/2008 with MGUS.  She required treatment in 12/2013.  She is currently on single agent daratumumab.  Bone marrow aspirate and biopsy on 08/24/2012 revealed a cellularity 40% with 22% plasma cells by aspirate differential, 20% by CD138 immunohistochemistry. The plasma cells were lambda light chain restricted.  Cytogenetics were normal.  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 There was no IGH rearrangement, 17 P deletion or abnormalities involving chromosome 1.   PET scan in 09/2012 revealed no evidence of FDG avid bone lesions.   SPEP has been followed:  2.1 on 08/24/2013, 1.6 on 06/14/2016, 1.6 on 10/05/2016, 2.0 on 03/21/2017, 2.1 on 05/31/2018, 1.8 on 04/03/2018, 1.8 on 05/08/2018, and 1.9 on 06/12/2018.  She began lenalidomide and dexamethasone beginning on ~12/05/2013.  She received ~1 cycle of therapy. Treatment was stopped secondary to angioedema felt due to lenalidomide.  She began bortezomib and dexamethasone on 01/28/2014.  She began bortizomib, daratumumab, and Decadron on 07/26/2017.  She received 9 cycles.  Velcade was dropped.  She receives daratumumab alone (last 06/12/2018).  She received Zometa beginning 01/28/2014 every 4 weeks.  She no longer receives Zometa.  Symptomatically, patient is anxious. She has a headache this morning. No B symptoms or recurrent infections. Exam is grossly unremarkably. WBC 11,300 ([place] 8800).   Plan: 1. Labs today: CBC with diff, CMP, Mg, SPEP, FLCA, LDH, beta2-microglobulin. 2. Multiple myeloma  Review plans for  re-initiation of single agent daratumumab.   She has received before, however side effects reviewed and questioned fielded.   Patient provides verbal consent to resume therapy today as planned.   Spoke with Dr. Evelene Croon at [facility]. Recommended adding back bortezomib every 2 weeks.   Labs reviewed. Blood counts stable and adequate enough for treatment. Will proceed with cycle #10 daratumumab. 3. Weight loss  Patient continues to lose weight. Her weight today is 126 lb (57.2 kg). Her BMI of 23.81 kg/m places her in the normal weight category.   Encouraged her to increase her intake of calorie  and protein dense food choices in small frequent meals.   Encouraged to utilize nutritional supplement shakes.  4. Preventative care  Discuss routine mammogram.  Discuss need for routine colonoscopy.  Discuss bone density study. 5. RTC on 08/14/2018 for MD assessment, labs (CBC with diff, CMP, Mg), and cycle #11 daratumumab +/- bortizomib   Honor Loh, NP  07/17/2018, 9:28 AM   I saw and evaluated the patient, participating in the key portions of the service and reviewing pertinent diagnostic studies and records.  I reviewed the nurse practitioner's note and agree with the findings and the plan.  The assessment and plan were discussed with the patient.  Several questions were asked by the patient and answered.   Nolon Stalls, MD 07/17/18, 9:28 AM

## 2018-07-17 ENCOUNTER — Inpatient Hospital Stay: Payer: Medicare Other

## 2018-07-17 ENCOUNTER — Other Ambulatory Visit: Payer: Self-pay

## 2018-07-17 ENCOUNTER — Inpatient Hospital Stay (HOSPITAL_BASED_OUTPATIENT_CLINIC_OR_DEPARTMENT_OTHER): Payer: Medicare Other | Admitting: Hematology and Oncology

## 2018-07-17 VITALS — BP 153/87 | HR 80 | Temp 97.9°F | Resp 18

## 2018-07-17 VITALS — BP 157/93 | HR 80 | Temp 98.1°F | Resp 18 | Wt 126.0 lb

## 2018-07-17 DIAGNOSIS — E785 Hyperlipidemia, unspecified: Secondary | ICD-10-CM | POA: Diagnosis not present

## 2018-07-17 DIAGNOSIS — C9000 Multiple myeloma not having achieved remission: Secondary | ICD-10-CM

## 2018-07-17 DIAGNOSIS — I10 Essential (primary) hypertension: Secondary | ICD-10-CM

## 2018-07-17 DIAGNOSIS — R634 Abnormal weight loss: Secondary | ICD-10-CM | POA: Diagnosis not present

## 2018-07-17 DIAGNOSIS — Z79899 Other long term (current) drug therapy: Secondary | ICD-10-CM | POA: Diagnosis not present

## 2018-07-17 DIAGNOSIS — N189 Chronic kidney disease, unspecified: Secondary | ICD-10-CM | POA: Diagnosis not present

## 2018-07-17 DIAGNOSIS — Z5112 Encounter for antineoplastic immunotherapy: Secondary | ICD-10-CM | POA: Diagnosis not present

## 2018-07-17 DIAGNOSIS — I129 Hypertensive chronic kidney disease with stage 1 through stage 4 chronic kidney disease, or unspecified chronic kidney disease: Secondary | ICD-10-CM | POA: Diagnosis not present

## 2018-07-17 DIAGNOSIS — Z7982 Long term (current) use of aspirin: Secondary | ICD-10-CM | POA: Diagnosis not present

## 2018-07-17 DIAGNOSIS — Z7189 Other specified counseling: Secondary | ICD-10-CM | POA: Insufficient documentation

## 2018-07-17 LAB — COMPREHENSIVE METABOLIC PANEL
ALT: 13 U/L (ref 0–44)
AST: 23 U/L (ref 15–41)
Albumin: 4 g/dL (ref 3.5–5.0)
Alkaline Phosphatase: 35 U/L — ABNORMAL LOW (ref 38–126)
Anion gap: 9 (ref 5–15)
BUN: 12 mg/dL (ref 8–23)
CO2: 27 mmol/L (ref 22–32)
Calcium: 9.7 mg/dL (ref 8.9–10.3)
Chloride: 105 mmol/L (ref 98–111)
Creatinine, Ser: 0.81 mg/dL (ref 0.44–1.00)
GFR calc Af Amer: >60 mL/min (ref >60)
GFR calc non Af Amer: >60 mL/min (ref >60)
Glucose, Bld: 114 mg/dL — ABNORMAL HIGH (ref 70–99)
Potassium: 3.5 mmol/L (ref 3.5–5.1)
Sodium: 141 mmol/L (ref 135–145)
Total Bilirubin: 1.1 mg/dL (ref 0.3–1.2)
Total Protein: 8 g/dL (ref 6.5–8.1)

## 2018-07-17 LAB — CBC WITH DIFFERENTIAL/PLATELET
Abs Immature Granulocytes: 0.03 10*3/uL (ref 0.00–0.07)
Basophils Absolute: 0 10*3/uL (ref 0.0–0.1)
Basophils Relative: 0 %
Eosinophils Absolute: 0.1 10*3/uL (ref 0.0–0.5)
Eosinophils Relative: 1 %
HCT: 38.2 % (ref 36.0–46.0)
Hemoglobin: 12.6 g/dL (ref 12.0–15.0)
Immature Granulocytes: 0 %
Lymphocytes Relative: 14 %
Lymphs Abs: 1.6 10*3/uL (ref 0.7–4.0)
MCH: 29.8 pg (ref 26.0–34.0)
MCHC: 33 g/dL (ref 30.0–36.0)
MCV: 90.3 fL (ref 80.0–100.0)
Monocytes Absolute: 0.8 10*3/uL (ref 0.1–1.0)
Monocytes Relative: 7 %
Neutro Abs: 8.8 10*3/uL — ABNORMAL HIGH (ref 1.7–7.7)
Neutrophils Relative %: 78 %
Platelets: 265 10*3/uL (ref 150–400)
RBC: 4.23 MIL/uL (ref 3.87–5.11)
RDW: 12.8 % (ref 11.5–15.5)
WBC: 11.3 10*3/uL — ABNORMAL HIGH (ref 4.0–10.5)
nRBC: 0 % (ref 0.0–0.2)

## 2018-07-17 LAB — MAGNESIUM: Magnesium: 2.2 mg/dL (ref 1.7–2.4)

## 2018-07-17 LAB — LACTATE DEHYDROGENASE: LDH: 124 U/L (ref 98–192)

## 2018-07-17 MED ORDER — HEPARIN SOD (PORK) LOCK FLUSH 100 UNIT/ML IV SOLN
500.0000 [IU] | Freq: Once | INTRAVENOUS | Status: AC
  Administered 2018-07-17: 500 [IU] via INTRAVENOUS
  Filled 2018-07-17: qty 5

## 2018-07-17 MED ORDER — DEXAMETHASONE 4 MG PO TABS
8.0000 mg | ORAL_TABLET | Freq: Once | ORAL | Status: AC
  Administered 2018-07-17: 8 mg via ORAL
  Filled 2018-07-17: qty 2

## 2018-07-17 MED ORDER — HEPARIN SOD (PORK) LOCK FLUSH 100 UNIT/ML IV SOLN: 500.0000 [IU] | Freq: Once | INTRAVENOUS | Status: DC | PRN

## 2018-07-17 MED ORDER — ACETAMINOPHEN 325 MG PO TABS
650.0000 mg | ORAL_TABLET | Freq: Once | ORAL | Status: AC
  Administered 2018-07-17: 650 mg via ORAL
  Filled 2018-07-17: qty 2

## 2018-07-17 MED ORDER — SODIUM CHLORIDE 0.9% FLUSH
10.0000 mL | Freq: Once | INTRAVENOUS | Status: AC
  Administered 2018-07-17: 10 mL via INTRAVENOUS
  Filled 2018-07-17: qty 10

## 2018-07-17 MED ORDER — DIPHENHYDRAMINE HCL 25 MG PO CAPS
50.0000 mg | ORAL_CAPSULE | Freq: Once | ORAL | Status: AC
  Administered 2018-07-17: 50 mg via ORAL
  Filled 2018-07-17: qty 2

## 2018-07-17 MED ORDER — SODIUM CHLORIDE 0.9 % IV SOLN
Freq: Once | INTRAVENOUS | Status: AC
  Administered 2018-07-17: 10:00:00 via INTRAVENOUS
  Filled 2018-07-17: qty 250

## 2018-07-17 MED ORDER — SODIUM CHLORIDE 0.9 % IV SOLN
900.0000 mg | Freq: Once | INTRAVENOUS | Status: AC
  Administered 2018-07-17: 900 mg via INTRAVENOUS
  Filled 2018-07-17: qty 40

## 2018-07-17 NOTE — Progress Notes (Signed)
Per Dr. Mike Gip okay to run Darzalex as a 3rd time infusion. Pt tolerated infusion well. Pt and VS stable at discharge.

## 2018-07-17 NOTE — Progress Notes (Signed)
Here for follow up. Pt denies pain. Related that she has mild mid head headache-( " may be from being anxious she stated )then spoke w rapid speech about  getting up early/ feeling anxious. Stated she lost or someone took her Klonopin she usually takes at hs.

## 2018-07-17 NOTE — Progress Notes (Signed)
START OFF PATHWAY REGIMEN - Multiple Myeloma and Other Plasma Cell Dyscrasias   [id number]:Bortezomib:   A cycle is every 21 days:     Bortezomib   **Always confirm dose/schedule in your pharmacy ordering system**  Patient Characteristics: Relapsed / Refractory, All Lines of Therapy R-ISS Staging: Unknown Disease Classification: Refractory Line of Therapy: Third Line Intent of Therapy: Non-Curative / Palliative Intent, Discussed with Patient

## 2018-07-18 LAB — PROTEIN ELECTROPHORESIS, SERUM
A/G Ratio: 1 (ref 0.7–1.7)
Albumin ELP: 3.8 g/dL (ref 2.9–4.4)
Alpha-1-Globulin: 0.3 g/dL (ref 0.0–0.4)
Alpha-2-Globulin: 0.8 g/dL (ref 0.4–1.0)
Beta Globulin: 0.8 g/dL (ref 0.7–1.3)
Gamma Globulin: 1.8 g/dL (ref 0.4–1.8)
Globulin, Total: 3.7 g/dL (ref 2.2–3.9)
M-Spike, %: 1.6 g/dL — ABNORMAL HIGH
Total Protein ELP: 7.5 g/dL (ref 6.0–8.5)

## 2018-07-18 LAB — KAPPA/LAMBDA LIGHT CHAINS
Kappa free light chain: 1.6 mg/L — ABNORMAL LOW (ref 3.3–19.4)
Kappa, lambda light chain ratio: 0 — ABNORMAL LOW (ref 0.26–1.65)
Lambda free light chains: 687.5 mg/L — ABNORMAL HIGH (ref 5.7–26.3)

## 2018-07-21 DIAGNOSIS — I10 Essential (primary) hypertension: Secondary | ICD-10-CM | POA: Diagnosis not present

## 2018-07-21 DIAGNOSIS — E039 Hypothyroidism, unspecified: Secondary | ICD-10-CM | POA: Diagnosis not present

## 2018-07-21 DIAGNOSIS — R3129 Other microscopic hematuria: Secondary | ICD-10-CM | POA: Diagnosis not present

## 2018-07-21 DIAGNOSIS — E559 Vitamin D deficiency, unspecified: Secondary | ICD-10-CM | POA: Diagnosis not present

## 2018-07-21 DIAGNOSIS — E789 Disorder of lipoprotein metabolism, unspecified: Secondary | ICD-10-CM | POA: Diagnosis not present

## 2018-07-27 ENCOUNTER — Encounter: Payer: Self-pay | Admitting: Emergency Medicine

## 2018-07-28 ENCOUNTER — Telehealth: Payer: Self-pay | Admitting: *Deleted

## 2018-07-28 NOTE — Telephone Encounter (Signed)
Patient called reporting she needs to know what is next, when does she come back etc. I returned call and got her voice mail I left message that she has appointment 08/14/18 @ 9 AM for lab, physician/ Treatment and that her treatment is planned for every 3 weeks. I asked she return my call if she has any further questions

## 2018-07-31 DIAGNOSIS — H0100B Unspecified blepharitis left eye, upper and lower eyelids: Secondary | ICD-10-CM | POA: Diagnosis not present

## 2018-07-31 DIAGNOSIS — H02052 Trichiasis without entropian right lower eyelid: Secondary | ICD-10-CM | POA: Diagnosis not present

## 2018-07-31 DIAGNOSIS — H0100A Unspecified blepharitis right eye, upper and lower eyelids: Secondary | ICD-10-CM | POA: Diagnosis not present

## 2018-08-02 DIAGNOSIS — E890 Postprocedural hypothyroidism: Secondary | ICD-10-CM | POA: Diagnosis not present

## 2018-08-02 DIAGNOSIS — E039 Hypothyroidism, unspecified: Secondary | ICD-10-CM | POA: Diagnosis not present

## 2018-08-02 DIAGNOSIS — R4182 Altered mental status, unspecified: Secondary | ICD-10-CM | POA: Diagnosis not present

## 2018-08-02 DIAGNOSIS — C73 Malignant neoplasm of thyroid gland: Secondary | ICD-10-CM | POA: Diagnosis not present

## 2018-08-08 ENCOUNTER — Ambulatory Visit: Payer: Self-pay | Admitting: Psychiatry

## 2018-08-13 ENCOUNTER — Encounter: Payer: Self-pay | Admitting: Hematology and Oncology

## 2018-08-14 ENCOUNTER — Inpatient Hospital Stay (HOSPITAL_BASED_OUTPATIENT_CLINIC_OR_DEPARTMENT_OTHER): Payer: Medicare Other | Admitting: Hematology and Oncology

## 2018-08-14 ENCOUNTER — Inpatient Hospital Stay: Payer: Medicare Other | Attending: Hematology and Oncology

## 2018-08-14 ENCOUNTER — Inpatient Hospital Stay: Payer: Medicare Other

## 2018-08-14 ENCOUNTER — Other Ambulatory Visit: Payer: Self-pay | Admitting: Hematology and Oncology

## 2018-08-14 ENCOUNTER — Encounter: Payer: Self-pay | Admitting: Hematology and Oncology

## 2018-08-14 VITALS — BP 141/85 | HR 72 | Temp 98.3°F | Resp 18 | Wt 128.1 lb

## 2018-08-14 DIAGNOSIS — R634 Abnormal weight loss: Secondary | ICD-10-CM

## 2018-08-14 DIAGNOSIS — I129 Hypertensive chronic kidney disease with stage 1 through stage 4 chronic kidney disease, or unspecified chronic kidney disease: Secondary | ICD-10-CM | POA: Diagnosis not present

## 2018-08-14 DIAGNOSIS — Z5112 Encounter for antineoplastic immunotherapy: Secondary | ICD-10-CM | POA: Diagnosis not present

## 2018-08-14 DIAGNOSIS — Z7982 Long term (current) use of aspirin: Secondary | ICD-10-CM | POA: Insufficient documentation

## 2018-08-14 DIAGNOSIS — N189 Chronic kidney disease, unspecified: Secondary | ICD-10-CM

## 2018-08-14 DIAGNOSIS — Z79899 Other long term (current) drug therapy: Secondary | ICD-10-CM

## 2018-08-14 DIAGNOSIS — J45909 Unspecified asthma, uncomplicated: Secondary | ICD-10-CM | POA: Insufficient documentation

## 2018-08-14 DIAGNOSIS — M1990 Unspecified osteoarthritis, unspecified site: Secondary | ICD-10-CM | POA: Diagnosis not present

## 2018-08-14 DIAGNOSIS — Z86718 Personal history of other venous thrombosis and embolism: Secondary | ICD-10-CM | POA: Insufficient documentation

## 2018-08-14 DIAGNOSIS — C9000 Multiple myeloma not having achieved remission: Secondary | ICD-10-CM | POA: Diagnosis not present

## 2018-08-14 DIAGNOSIS — F419 Anxiety disorder, unspecified: Secondary | ICD-10-CM | POA: Insufficient documentation

## 2018-08-14 DIAGNOSIS — Z85820 Personal history of malignant melanoma of skin: Secondary | ICD-10-CM | POA: Insufficient documentation

## 2018-08-14 DIAGNOSIS — E785 Hyperlipidemia, unspecified: Secondary | ICD-10-CM | POA: Insufficient documentation

## 2018-08-14 DIAGNOSIS — Z95828 Presence of other vascular implants and grafts: Secondary | ICD-10-CM

## 2018-08-14 LAB — CBC WITH DIFFERENTIAL/PLATELET
Abs Immature Granulocytes: 0.02 10*3/uL (ref 0.00–0.07)
Basophils Absolute: 0 10*3/uL (ref 0.0–0.1)
Basophils Relative: 0 %
Eosinophils Absolute: 0.1 10*3/uL (ref 0.0–0.5)
Eosinophils Relative: 1 %
HCT: 36.3 % (ref 36.0–46.0)
Hemoglobin: 11.9 g/dL — ABNORMAL LOW (ref 12.0–15.0)
Immature Granulocytes: 0 %
Lymphocytes Relative: 32 %
Lymphs Abs: 2.2 10*3/uL (ref 0.7–4.0)
MCH: 30.1 pg (ref 26.0–34.0)
MCHC: 32.8 g/dL (ref 30.0–36.0)
MCV: 91.9 fL (ref 80.0–100.0)
Monocytes Absolute: 0.7 10*3/uL (ref 0.1–1.0)
Monocytes Relative: 9 %
Neutro Abs: 3.9 10*3/uL (ref 1.7–7.7)
Neutrophils Relative %: 58 %
Platelets: 277 10*3/uL (ref 150–400)
RBC: 3.95 MIL/uL (ref 3.87–5.11)
RDW: 13.1 % (ref 11.5–15.5)
WBC: 6.9 10*3/uL (ref 4.0–10.5)
nRBC: 0 % (ref 0.0–0.2)

## 2018-08-14 LAB — COMPREHENSIVE METABOLIC PANEL
ALT: 16 U/L (ref 0–44)
AST: 24 U/L (ref 15–41)
Albumin: 3.9 g/dL (ref 3.5–5.0)
Alkaline Phosphatase: 36 U/L — ABNORMAL LOW (ref 38–126)
Anion gap: 9 (ref 5–15)
BUN: 14 mg/dL (ref 8–23)
CO2: 27 mmol/L (ref 22–32)
Calcium: 9.4 mg/dL (ref 8.9–10.3)
Chloride: 105 mmol/L (ref 98–111)
Creatinine, Ser: 0.8 mg/dL (ref 0.44–1.00)
GFR calc Af Amer: >60 mL/min (ref >60)
GFR calc non Af Amer: >60 mL/min (ref >60)
Glucose, Bld: 94 mg/dL (ref 70–99)
Potassium: 4 mmol/L (ref 3.5–5.1)
Sodium: 141 mmol/L (ref 135–145)
Total Bilirubin: 0.7 mg/dL (ref 0.3–1.2)
Total Protein: 7.9 g/dL (ref 6.5–8.1)

## 2018-08-14 LAB — MAGNESIUM: Magnesium: 2.4 mg/dL (ref 1.7–2.4)

## 2018-08-14 MED ORDER — ACETAMINOPHEN 325 MG PO TABS
650.0000 mg | ORAL_TABLET | Freq: Once | ORAL | Status: AC
  Administered 2018-08-14: 650 mg via ORAL
  Filled 2018-08-14: qty 2

## 2018-08-14 MED ORDER — DEXAMETHASONE 4 MG PO TABS
8.0000 mg | ORAL_TABLET | Freq: Once | ORAL | Status: AC
  Administered 2018-08-14: 8 mg via ORAL
  Filled 2018-08-14: qty 2

## 2018-08-14 MED ORDER — DIPHENHYDRAMINE HCL 25 MG PO CAPS
50.0000 mg | ORAL_CAPSULE | Freq: Once | ORAL | Status: AC
  Administered 2018-08-14: 50 mg via ORAL
  Filled 2018-08-14: qty 2

## 2018-08-14 MED ORDER — BORTEZOMIB CHEMO SQ INJECTION 3.5 MG (2.5MG/ML)
1.3000 mg/m2 | Freq: Once | INTRAMUSCULAR | Status: AC
  Administered 2018-08-14: 2 mg via SUBCUTANEOUS
  Filled 2018-08-14: qty 0.8

## 2018-08-14 MED ORDER — SODIUM CHLORIDE 0.9 % IV SOLN
Freq: Once | INTRAVENOUS | Status: AC
  Administered 2018-08-14: 11:00:00 via INTRAVENOUS
  Filled 2018-08-14: qty 250

## 2018-08-14 MED ORDER — BORTEZOMIB CHEMO IV INJECTION 3.5 MG(1MG/ML): 1.3000 mg/m2 | Freq: Once | INTRAMUSCULAR | Status: DC

## 2018-08-14 MED ORDER — SODIUM CHLORIDE 0.9 % IV SOLN
900.0000 mg | Freq: Once | INTRAVENOUS | Status: AC
  Administered 2018-08-14: 900 mg via INTRAVENOUS
  Filled 2018-08-14: qty 45

## 2018-08-14 MED ORDER — HEPARIN SOD (PORK) LOCK FLUSH 100 UNIT/ML IV SOLN
INTRAVENOUS | Status: AC
  Filled 2018-08-14: qty 5

## 2018-08-14 MED ORDER — HEPARIN SOD (PORK) LOCK FLUSH 100 UNIT/ML IV SOLN
500.0000 [IU] | Freq: Once | INTRAVENOUS | Status: AC
  Administered 2018-08-14: 500 [IU] via INTRAVENOUS

## 2018-08-14 NOTE — Progress Notes (Signed)
Patient has questions for MD today regarding appointments, plans, etc.

## 2018-08-14 NOTE — Progress Notes (Signed)
[facility] day:  08/14/2018  Chief Complaint: [name] is a 77 y.o. female with multiple myeloma who is seen for assessment prior to cycle #11 daratumumab and reinitiation of Velcade.  HPI:  The patient was last seen in the medical oncology clinic on 07/17/2018.  At that time, she was anxious. She had a headache.  She denied any B symptoms or recurrent infections. Exam was grossly unremarkably. WBC was 11,300 ([place] 8800).   She received cycle #10 daratumumab (first cycle at [facility]).  She tolerated her infusion well.  During the interim, she remains concerned about her treatment and the care she was receiving at [facility].  She states that she needs to go to the dentist.  She states that she has a tender left lower extremity near her "spider bite".  She has been "off many pills for 2 days".   Past Medical History:  Diagnosis Date  . Anxiety   . Arthritis   . Asthma   . Cancer (HCC)    THYROID, SKIN, NOSE, BONE  . Chronic kidney disease   . Diverticulosis    Pt reported on 03/15/12  . Eye cancer [facility])    R eye cancer - 3-4 y ago  . Eye problems    LOST VISION RIGHT EYE  . Hernia   . History of blood clots    LEG  . Hyperlipidemia   . Hypertension   . Melanoma ([place])   . Melanoma ([place]) 1989   chest  . MGUS (monoclonal gammopathy of unknown significance)   . MGUS (monoclonal gammopathy of unknown significance)   . Mitral valve prolapse   . Perforated bowel ([place])   . Peritonitis ([place])   . Smoldering multiple myeloma ([place])   . Stroke ([place])   . Thyroid disease     Past Surgical History:  Procedure Laterality Date  . ABDOMINAL HYSTERECTOMY    . BREAST LUMPECTOMY  12/2010   R breast  . BREAST LUMPECTOMY  2004   L axilla neg for cancer.  [name] CARDIOVASCULAR STRESS TEST  2006   NORMAL  . HERNIA REPAIR    . LEG SURGERY     VEIN & BLOOD CLOT  . MELANOMA EXCISION     R eye  . ROTATOR CUFF REPAIR  2004   RIGHT SHOULDER  . SKIN CANCER  EXCISION  2011, 2013   squamous cell of nose x 2  . TRANSTHORACIC ECHOCARDIOGRAM  2006    Family History  Problem Relation Age of Onset  . Asthma Father   . Diabetes Father   . Heart failure Father   . Cancer Father        Prostate and kidney cancer  . Emphysema Father   . Diabetes Sister   . Cancer Sister   . Stroke Mother 66       Her mother passed away from this stroke at the age of 38  . Cancer Brother        brain cancer & prostate cancer  . Cervical cancer Sister   . Cancer Sister        Recurrent cancer involving the neck and the jaw    Social History:  reports that she is a non-smoker but has been exposed to tobacco smoke. She has never used smokeless tobacco. She reports that she does not drink alcohol or use drugs.  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 She ultimately retired from the [employer].  Patient denies known exposures to radiation on toxins.  She lives alone in [place].  The patient is alone today.  Allergies:  Allergies  Allergen Reactions  . Aldactone [Spironolactone] Palpitations    Burning in stomach Burning in stomach Burning in stomach  . Atorvastatin Other (See Comments) and Hives    Patient stated legs hurt so bad she could hardly walk  Patient stated legs hurt so bad she could hardly walk  Patient stated legs hurt so bad she could hardly walk  Patient stated legs hurt so bad she could hardly walk  Patient stated legs hurt so bad she could hardly walk  Patient stated legs hurt so bad she could hardly walk    . Decadrol [Dexamethasone] Anaphylaxis and Palpitations    Other reaction(s): Unknown  . Doxycycline Other (See Comments)    SEVERE HEADACHES SEVERE HEADACHES  . Fluconazole Swelling    Facial swelling  . Losartan Swelling  . Quinolones Anxiety, Hives and Other (See Comments)    Leg Pain Leg Pain   . Revlimid [Lenalidomide]     Angioedema  . Sertraline Hcl Palpitations  . Hydrocodone Rash and Other (See  Comments)  . Levofloxacin Hives    Leg Pain  . Prednisolone Hives and Other (See Comments)    Headache, left eye swelling, forehead swelling Headache, left eye swelling, forehead swelling  . Sertraline Anxiety and Rash  . Tramadol Rash and Other (See Comments)    Headaches  Headaches  Headaches  Headaches  Headaches  Headaches   . Levofloxacin Other (See Comments)    Severe Myalgias  . Prednisone Other (See Comments)  . Pseudoephedrine     stroke  . Pseudoephedrine Hcl Other (See Comments)    Pt states she had a stroke while taking sudafed  . Sertraline Hcl   . Sudafed [Pseudoephedrine Hcl]   . Trazodone And Nefazodone     Side affects  . Trazodone Hcl Other (See Comments)    Side affects Side affects  Side affects  . Hydrocodone-Acetaminophen Anxiety  . Latex Rash  . Tape Rash    Current Medications: Current Outpatient Medications  Medication Sig Dispense Refill  . acetaminophen (TYLENOL) 500 MG tablet Take 250-500 mg by mouth every 6 (six) hours as needed for mild pain, moderate pain, fever or headache.     . ALPRAZolam (XANAX) 0.5 MG tablet Take 1/2-1 tablet up to three times a day as needed for anxiety. 60 tablet 0  . amLODipine (NORVASC) 10 MG tablet Take 10 mg by mouth daily.    [name] aspirin 81 MG tablet Chew 81 mg by mouth at bedtime.     [name] azelastine (ASTELIN) 0.1 % nasal spray Place 1 spray into both nostrils daily as needed for allergies.     . carboxymethylcellulose (REFRESH TEARS) 0.5 % SOLN Place 1 drop into both eyes 3 (three) times daily as needed (for allergies).    . clonazePAM (KLONOPIN) 1 MG tablet Take 1 mg by mouth at bedtime.    . fexofenadine (ALLEGRA) 180 MG tablet Take 180 mg by mouth daily.     . fluticasone (FLONASE) 50 MCG/ACT nasal spray Place 1 spray into both nostrils 2 (two) times daily. 16 g 11  . guaiFENesin-dextromethorphan (ROBITUSSIN DM) 100-10 MG/5ML syrup Take 5 mLs by mouth every 4 (four) hours as needed for cough. 118 mL 0  .  levothyroxine (SYNTHROID, LEVOTHROID) 137 MCG tablet Take 137 mcg by mouth daily before breakfast.     . loperamide (IMODIUM  A-D) 2 MG tablet Take 2-4 mg by mouth 4 (four) times daily as needed for diarrhea or loose stools.    [name] neomycin-polymyxin b-dexamethasone (MAXITROL) 3.5-10000-0.1 OINT APPLY TO LEFT EYE 2 TIMES DAILY  0  . OLANZapine (ZYPREXA) 2.5 MG tablet Take 2.5 mg by mouth at bedtime.    [name] perphenazine (TRILAFON) 2 MG tablet Take 2 mg by mouth at bedtime.    Vladimir Faster Glycol-Propyl Glycol (SYSTANE) 0.4-0.3 % SOLN Place 1 drop into both eyes 2 (two) times daily as needed (for allergies).    . simvastatin (ZOCOR) 20 MG tablet Take 1 tablet (20 mg total) by mouth every evening. 90 tablet 3  . MICARDIS 40 MG tablet Take 1 tablet (40 mg total) by mouth daily. (Patient not taking: Reported on 07/17/2018) 90 tablet 3   No current facility-administered medications for this visit.    Facility-Administered Medications Ordered in Other Visits  Medication Dose Route Frequency Provider Last Rate Last Dose  . heparin lock flush 100 unit/mL  500 Units Intravenous Once Brunetta Genera, MD      . sodium chloride flush (NS) 0.9 % injection 10 mL  10 mL Intravenous Once Brunetta Genera, MD         Review of Systems  Constitutional: Positive for weight loss (stable). Negative for chills, diaphoresis, fever and malaise/fatigue.       Feels "ok".  HENT: Negative.  Negative for congestion, ear discharge, ear pain, nosebleeds, sinus pain, sore throat and tinnitus.        Chronic rhinorrhea  Eyes: Negative for blurred vision, double vision, photophobia, pain and discharge.       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   Respiratory: Positive for cough (chronic at night). Negative for hemoptysis, sputum production and shortness of breath.   Cardiovascular: Negative.  Negative for chest pain, palpitations, orthopnea, leg swelling and PND.  Gastrointestinal: Positive for diarrhea  (chronic). Negative for abdominal pain, blood in stool, constipation, melena, nausea and vomiting.  Genitourinary: Negative.  Negative for dysuria, frequency, hematuria and urgency.  Musculoskeletal: Positive for joint pain (osteoarthritis- no change). Negative for back pain, falls and myalgias.  Skin: Negative.  Negative for itching and rash.       Still tender at old "spider bite".  Neurological: Negative for dizziness, tingling, tremors, sensory change, speech change, focal weakness, weakness and headaches.  Endo/Heme/Allergies: Negative.  Does not bruise/bleed easily.       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 Negative for depression. The patient is nervous/anxious (chronic). The patient does not have insomnia.   All other systems reviewed and are negative.  Performance status (ECOG): 1 - Symptomatic but completely ambulatory  Vital Signs BP (!) 141/85 (BP Location: Left Arm, Patient Position: Sitting)   Pulse 72   Temp 98.3 F (36.8 C) (Tympanic)   Resp 18   Wt 128 lb 1 oz (58.1 kg)   BMI 24.20 kg/m   Physical Exam  Constitutional: She is oriented to person, place, and time and well-developed, well-nourished, and in no distress. No distress.  HENT:  Head: Normocephalic and atraumatic.  Mouth/Throat: Oropharynx is clear and moist and mucous membranes are normal.  Curly blonde hair.  Eyes: Pupils are equal, round, and reactive to light. Conjunctivae and EOM are normal. No scleral icterus.  Blue eyes.  Neck: Normal range of motion. Neck supple. No JVD present.  Cardiovascular: Normal rate, regular rhythm, normal heart sounds and intact distal pulses.  Exam reveals no gallop and no friction rub.  No murmur heard. Pulmonary/Chest: Effort normal and breath sounds normal. No respiratory distress. She has no wheezes. She has no rales.  Abdominal: Soft. Bowel sounds are normal. She exhibits no distension and no mass. There is no  tenderness. There is no rebound and no guarding.  Musculoskeletal: Normal range of motion. She exhibits no edema or tenderness.  Lymphadenopathy:    She has no cervical adenopathy.    She has no axillary adenopathy.       Right: No inguinal and no supraclavicular adenopathy present.       Left: No inguinal and no supraclavicular adenopathy present.  Neurological: She is alert and oriented to person, place, and time. Gait normal.  Skin: Skin is warm and dry. No rash noted. She is not diaphoretic. No erythema.  Old scarring at are of old "spider bite" without erythema, induration or tenderness.  Psychiatric: Affect and judgment normal. Her mood appears anxious.  Nursing note and vitals reviewed.   Infusion on 08/14/2018  Component Date Value Ref Range Status  . Total Protein ELP 08/14/2018 7.3  6.0 - 8.5 g/dL Final  . Albumin ELP 08/14/2018 3.8  2.9 - 4.4 g/dL Final  . Alpha-1-Globulin 08/14/2018 0.3  0.0 - 0.4 g/dL Final  . Alpha-2-Globulin 08/14/2018 0.7  0.4 - 1.0 g/dL Final  . Beta Globulin 08/14/2018 0.8  0.7 - 1.3 g/dL Final  . Gamma Globulin 08/14/2018 1.8  0.4 - 1.8 g/dL Final  . M-Spike, % 08/14/2018 1.6* Not Observed g/dL Final  . SPE Interp. 08/14/2018 Comment   Final   Comment: (NOTE) The SPE pattern demonstrates a single peak (M-spike) in the gamma region which may represent monoclonal protein. This peak may also be caused by circulating immune complexes, cryoglobulins, C-reactive protein, fibrinogen or hemolysis.  If clinically indicated, the presence of a monoclonal gammopathy may be confirmed by immuno- fixation, as well as an evaluation of the urine for the presence of Bence-Jones protein. Performed At: [facility] [place], [place] [id number] Rush Farmer MD [id number]   . Comment 08/14/2018 Comment   Final   Comment: (NOTE) Protein electrophoresis scan will follow via computer, mail, or courier delivery.   [name] GLOBULIN, TOTAL  08/14/2018 3.5  2.2 - 3.9 g/dL Corrected  . A/G Ratio 08/14/2018 1.1  0.7 - 1.7 Corrected  . Magnesium 08/14/2018 2.4  1.7 - 2.4 mg/dL Final   Performed at [facility], [address] [place], [place] [address]  . Sodium 08/14/2018 141  135 - 145 mmol/L Final  . Potassium 08/14/2018 4.0  3.5 - 5.1 mmol/L Final  . Chloride 08/14/2018 105  98 - 111 mmol/L Final  . CO2 08/14/2018 27  22 - 32 mmol/L Final  . Glucose, Bld 08/14/2018 94  70 - 99 mg/dL Final  . BUN 08/14/2018 14  8 - 23 mg/dL Final  . Creatinine, Ser 08/14/2018 0.80  0.44 - 1.00 mg/dL Final  . Calcium 08/14/2018 9.4  8.9 - 10.3 mg/dL Final  . Total Protein 08/14/2018 7.9  6.5 - 8.1 g/dL Final  . Albumin 08/14/2018 3.9  3.5 - 5.0 g/dL Final  . AST 08/14/2018 24  15 - 41 U/L Final  . ALT 08/14/2018 16  0 - 44 U/L Final  . Alkaline Phosphatase 08/14/2018 36* 38 - 126 U/L Final  . Total Bilirubin 08/14/2018 0.7  0.3 - 1.2 mg/dL Final  . GFR calc non Af Amer 08/14/2018 >60  >  60 mL/min Final  . GFR calc Af Amer 08/14/2018 >60  >60 mL/min Final   Comment: (NOTE) The eGFR has been calculated using the CKD EPI equation. This calculation has not been validated in all clinical situations. eGFR's persistently <60 mL/min signify possible Chronic Kidney Disease.   Georgiann Hahn gap 08/14/2018 9  5 - 15 Final   Performed at [facility], [address] [place], [place] [address]  . WBC 08/14/2018 6.9  4.0 - 10.5 K/uL Final  . RBC 08/14/2018 3.95  3.87 - 5.11 MIL/uL Final  . Hemoglobin 08/14/2018 11.9* 12.0 - 15.0 g/dL Final  . HCT 08/14/2018 36.3  36.0 - 46.0 % Final  . MCV 08/14/2018 91.9  80.0 - 100.0 fL Final  . MCH 08/14/2018 30.1  26.0 - 34.0 pg Final  . MCHC 08/14/2018 32.8  30.0 - 36.0 g/dL Final  . RDW 08/14/2018 13.1  11.5 - 15.5 % Final  . Platelets 08/14/2018 277  150 - 400 K/uL Final  . nRBC 08/14/2018 0.0  0.0 - 0.2 % Final  . Neutrophils Relative % 08/14/2018 58  % Final  . Neutro Abs 08/14/2018 3.9  1.7 -  7.7 K/uL Final  . Lymphocytes Relative 08/14/2018 32  % Final  . Lymphs Abs 08/14/2018 2.2  0.7 - 4.0 K/uL Final  . Monocytes Relative 08/14/2018 9  % Final  . Monocytes Absolute 08/14/2018 0.7  0.1 - 1.0 K/uL Final  . Eosinophils Relative 08/14/2018 1  % Final  . Eosinophils Absolute 08/14/2018 0.1  0.0 - 0.5 K/uL Final  . Basophils Relative 08/14/2018 0  % Final  . Basophils Absolute 08/14/2018 0.0  0.0 - 0.1 K/uL Final  . Immature Granulocytes 08/14/2018 0  % Final  . Abs Immature Granulocytes 08/14/2018 0.02  0.00 - 0.07 K/uL Final   Performed at [facility], [address] [place], [place] [address]    Assessment:  [name] is a 77 y.o. female with IgG lambda multiple myeloma.  She presented in 01/2008 with MGUS.  She required treatment in 12/2013.  She is currently on single agent daratumumab.  Bone marrow aspirate and biopsy on 08/24/2012 revealed a cellularity 40% with 22% plasma cells by aspirate differential, 20% by CD138 immunohistochemistry. The plasma cells were lambda light chain restricted.  Cytogenetics were normal.  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 There was no IGH rearrangement, 17 P deletion or abnormalities involving chromosome 1.   PET scan in 09/2012 revealed no evidence of FDG avid bone lesions.   SPEP has been followed:  2.1 on 08/24/2013, 1.6 on 06/14/2016, 1.6 on 10/05/2016, 2.0 on 03/21/2017, 2.1 on 05/31/2017, 1.8 on 04/03/2018, 1.8 on 05/08/2018, 1.9 on 06/12/2018, 1.6 on 07/17/2018, and 1.6 on 08/14/2018.  Lambda free light chains were 687.5 (ratio 0.00) on 07/17/2018.  She began lenalidomide and dexamethasone beginning on ~12/05/2013.  She received ~1 cycle of therapy. Treatment was stopped secondary to angioedema felt due to lenalidomide.  She began bortezomib and dexamethasone on 01/28/2014.  She began bortizomib, daratumumab, and Decadron on 07/26/2017.  She received 9 cycles.  Velcade was dropped.  She is s/p cycle  #10 daratumumab (last 07/17/2018).  She received Zometa beginning 01/28/2014 every 4 weeks.  She no longer receives Zometa.  Symptomatically, she remains anxious.  She notes tenderness at an old "spider bite".  Exam is unremarkable.  Plan: 1. Labs today:  CBC with diff, CMP, Mg, SPEP. 2. Multiple myeloma: Review plan  for ongoing daratumumab every 4 weeks. Review plan for Velcade every 2 weeks with 1 week every 4 weeks overlapping with daratumumab. Discuss initial conversation with Dr Elsie Lincoln NP then with Dr. Evelene Croon. Discuss plan to reinitiate Zometa every 12 weeks.  Preauth Zometa.  Discuss dental clearance. Proceed with cycle #11 daratumumab.  Patient consented. Proceed with reinitiation of Velcade.  Patient consented. 3. Weight loss:  Weight stable.  Encourage caloric intake with supplements. 4. Preventative care: Review plan for routine mammogram (last 01/15/2013). Review plan for colonoscopy (? last 09/30/1994). Review plan for bone density study every 2 years. 5. RTC in 2 weeks for labs (CBC with diff, CMP), and Velcade. 6. RTC in 4 weeks for MD assessment, labs (CBC with diff, CMP, SPEP, FLCA), daratumumab + Velcade.   Lequita Asal, MD  08/14/2018, 4:35 PM

## 2018-08-15 LAB — PROTEIN ELECTROPHORESIS, SERUM
A/G Ratio: 1.1 (ref 0.7–1.7)
Albumin ELP: 3.8 g/dL (ref 2.9–4.4)
Alpha-1-Globulin: 0.3 g/dL (ref 0.0–0.4)
Alpha-2-Globulin: 0.7 g/dL (ref 0.4–1.0)
Beta Globulin: 0.8 g/dL (ref 0.7–1.3)
Gamma Globulin: 1.8 g/dL (ref 0.4–1.8)
Globulin, Total: 3.5 g/dL (ref 2.2–3.9)
M-Spike, %: 1.6 g/dL — ABNORMAL HIGH
Total Protein ELP: 7.3 g/dL (ref 6.0–8.5)

## 2018-08-17 DIAGNOSIS — N390 Urinary tract infection, site not specified: Secondary | ICD-10-CM | POA: Diagnosis not present

## 2018-08-17 DIAGNOSIS — N644 Mastodynia: Secondary | ICD-10-CM | POA: Diagnosis not present

## 2018-08-21 DIAGNOSIS — R102 Pelvic and perineal pain: Secondary | ICD-10-CM | POA: Diagnosis not present

## 2018-08-24 ENCOUNTER — Other Ambulatory Visit: Payer: Self-pay | Admitting: Hematology and Oncology

## 2018-08-25 DIAGNOSIS — H31001 Unspecified chorioretinal scars, right eye: Secondary | ICD-10-CM | POA: Diagnosis not present

## 2018-08-25 DIAGNOSIS — H02052 Trichiasis without entropian right lower eyelid: Secondary | ICD-10-CM | POA: Diagnosis not present

## 2018-08-25 DIAGNOSIS — H0100A Unspecified blepharitis right eye, upper and lower eyelids: Secondary | ICD-10-CM | POA: Diagnosis not present

## 2018-08-28 ENCOUNTER — Inpatient Hospital Stay: Payer: Medicare Other

## 2018-08-28 ENCOUNTER — Other Ambulatory Visit: Payer: Self-pay | Admitting: Hematology and Oncology

## 2018-08-28 VITALS — BP 129/74 | HR 74 | Temp 98.5°F | Resp 20 | Wt 127.8 lb

## 2018-08-28 DIAGNOSIS — R634 Abnormal weight loss: Secondary | ICD-10-CM | POA: Diagnosis not present

## 2018-08-28 DIAGNOSIS — I129 Hypertensive chronic kidney disease with stage 1 through stage 4 chronic kidney disease, or unspecified chronic kidney disease: Secondary | ICD-10-CM | POA: Diagnosis not present

## 2018-08-28 DIAGNOSIS — N189 Chronic kidney disease, unspecified: Secondary | ICD-10-CM | POA: Diagnosis not present

## 2018-08-28 DIAGNOSIS — C9000 Multiple myeloma not having achieved remission: Secondary | ICD-10-CM

## 2018-08-28 DIAGNOSIS — Z5112 Encounter for antineoplastic immunotherapy: Secondary | ICD-10-CM | POA: Diagnosis not present

## 2018-08-28 DIAGNOSIS — E785 Hyperlipidemia, unspecified: Secondary | ICD-10-CM | POA: Diagnosis not present

## 2018-08-28 LAB — CBC WITH DIFFERENTIAL/PLATELET
Abs Immature Granulocytes: 0.02 10*3/uL (ref 0.00–0.07)
Basophils Absolute: 0 10*3/uL (ref 0.0–0.1)
Basophils Relative: 0 %
Eosinophils Absolute: 0.1 10*3/uL (ref 0.0–0.5)
Eosinophils Relative: 1 %
HCT: 37.6 % (ref 36.0–46.0)
Hemoglobin: 12.2 g/dL (ref 12.0–15.0)
Immature Granulocytes: 0 %
Lymphocytes Relative: 27 %
Lymphs Abs: 2.4 10*3/uL (ref 0.7–4.0)
MCH: 30 pg (ref 26.0–34.0)
MCHC: 32.4 g/dL (ref 30.0–36.0)
MCV: 92.4 fL (ref 80.0–100.0)
Monocytes Absolute: 0.7 10*3/uL (ref 0.1–1.0)
Monocytes Relative: 8 %
Neutro Abs: 5.6 10*3/uL (ref 1.7–7.7)
Neutrophils Relative %: 64 %
Platelets: 246 10*3/uL (ref 150–400)
RBC: 4.07 MIL/uL (ref 3.87–5.11)
RDW: 12.6 % (ref 11.5–15.5)
WBC: 8.7 10*3/uL (ref 4.0–10.5)
nRBC: 0 % (ref 0.0–0.2)

## 2018-08-28 LAB — COMPREHENSIVE METABOLIC PANEL
ALT: 13 U/L (ref 0–44)
AST: 17 U/L (ref 15–41)
Albumin: 4.1 g/dL (ref 3.5–5.0)
Alkaline Phosphatase: 35 U/L — ABNORMAL LOW (ref 38–126)
Anion gap: 9 (ref 5–15)
BUN: 18 mg/dL (ref 8–23)
CO2: 28 mmol/L (ref 22–32)
Calcium: 9.7 mg/dL (ref 8.9–10.3)
Chloride: 104 mmol/L (ref 98–111)
Creatinine, Ser: 0.78 mg/dL (ref 0.44–1.00)
GFR calc Af Amer: >60 mL/min (ref >60)
GFR calc non Af Amer: >60 mL/min (ref >60)
Glucose, Bld: 99 mg/dL (ref 70–99)
Potassium: 3.5 mmol/L (ref 3.5–5.1)
Sodium: 141 mmol/L (ref 135–145)
Total Bilirubin: 1.2 mg/dL (ref 0.3–1.2)
Total Protein: 7.9 g/dL (ref 6.5–8.1)

## 2018-08-28 MED ORDER — ONDANSETRON HCL 4 MG PO TABS
8.0000 mg | ORAL_TABLET | Freq: Once | ORAL | Status: AC
  Administered 2018-08-28: 8 mg via ORAL
  Filled 2018-08-28: qty 2

## 2018-08-28 MED ORDER — BORTEZOMIB CHEMO SQ INJECTION 3.5 MG (2.5MG/ML)
1.3000 mg/m2 | Freq: Once | INTRAMUSCULAR | Status: AC
  Administered 2018-08-28: 2 mg via SUBCUTANEOUS
  Filled 2018-08-28: qty 0.8

## 2018-09-06 DIAGNOSIS — R222 Localized swelling, mass and lump, trunk: Secondary | ICD-10-CM | POA: Diagnosis not present

## 2018-09-07 ENCOUNTER — Telehealth: Payer: Self-pay

## 2018-09-07 NOTE — Telephone Encounter (Signed)
The patient  was inquirying about appointment time and date. I have explained to the patient that she will be treated every 2 weeks with Bortezomib. The patient was agreeable to keep schedule appointment.

## 2018-09-07 NOTE — Telephone Encounter (Signed)
-----   Message from Karen Kitchens, NP sent at 09/07/2018  1:07 PM EST ----- Called wanting chemo schedule for future treatments. She will be on the same schedule  1. Daratumumab q 28 days 2. Bortezomib every 2 weeks.   We will have her a NEW printed schedule when she comes in for treatment on Monday.   Thanks,  Gaspar Bidding

## 2018-09-11 ENCOUNTER — Inpatient Hospital Stay: Payer: Medicare Other

## 2018-09-11 ENCOUNTER — Other Ambulatory Visit: Payer: Self-pay

## 2018-09-11 ENCOUNTER — Inpatient Hospital Stay (HOSPITAL_BASED_OUTPATIENT_CLINIC_OR_DEPARTMENT_OTHER): Payer: Medicare Other | Admitting: Hematology and Oncology

## 2018-09-11 ENCOUNTER — Other Ambulatory Visit: Payer: Self-pay | Admitting: Hematology and Oncology

## 2018-09-11 ENCOUNTER — Inpatient Hospital Stay: Payer: Medicare Other | Attending: Hematology and Oncology

## 2018-09-11 ENCOUNTER — Encounter: Payer: Self-pay | Admitting: Hematology and Oncology

## 2018-09-11 VITALS — BP 137/87 | HR 76 | Temp 96.8°F | Resp 18 | Wt 125.0 lb

## 2018-09-11 DIAGNOSIS — K5909 Other constipation: Secondary | ICD-10-CM | POA: Insufficient documentation

## 2018-09-11 DIAGNOSIS — Z5111 Encounter for antineoplastic chemotherapy: Secondary | ICD-10-CM | POA: Diagnosis not present

## 2018-09-11 DIAGNOSIS — C9000 Multiple myeloma not having achieved remission: Secondary | ICD-10-CM

## 2018-09-11 DIAGNOSIS — Z5112 Encounter for antineoplastic immunotherapy: Secondary | ICD-10-CM

## 2018-09-11 DIAGNOSIS — C73 Malignant neoplasm of thyroid gland: Secondary | ICD-10-CM

## 2018-09-11 DIAGNOSIS — Z79899 Other long term (current) drug therapy: Secondary | ICD-10-CM

## 2018-09-11 DIAGNOSIS — Z95828 Presence of other vascular implants and grafts: Secondary | ICD-10-CM

## 2018-09-11 DIAGNOSIS — R17 Unspecified jaundice: Secondary | ICD-10-CM

## 2018-09-11 LAB — COMPREHENSIVE METABOLIC PANEL
ALT: 12 U/L (ref 0–44)
AST: 18 U/L (ref 15–41)
Albumin: 4.1 g/dL (ref 3.5–5.0)
Alkaline Phosphatase: 34 U/L — ABNORMAL LOW (ref 38–126)
Anion gap: 8 (ref 5–15)
BUN: 15 mg/dL (ref 8–23)
CO2: 25 mmol/L (ref 22–32)
Calcium: 9.3 mg/dL (ref 8.9–10.3)
Chloride: 108 mmol/L (ref 98–111)
Creatinine, Ser: 0.83 mg/dL (ref 0.44–1.00)
GFR calc Af Amer: >60 mL/min (ref >60)
GFR calc non Af Amer: >60 mL/min (ref >60)
Glucose, Bld: 105 mg/dL — ABNORMAL HIGH (ref 70–99)
Potassium: 3.6 mmol/L (ref 3.5–5.1)
Sodium: 141 mmol/L (ref 135–145)
Total Bilirubin: 1.4 mg/dL — ABNORMAL HIGH (ref 0.3–1.2)
Total Protein: 7.9 g/dL (ref 6.5–8.1)

## 2018-09-11 LAB — CBC WITH DIFFERENTIAL/PLATELET
Abs Immature Granulocytes: 0.01 10*3/uL (ref 0.00–0.07)
Basophils Absolute: 0 10*3/uL (ref 0.0–0.1)
Basophils Relative: 0 %
Eosinophils Absolute: 0.1 10*3/uL (ref 0.0–0.5)
Eosinophils Relative: 1 %
HCT: 37.3 % (ref 36.0–46.0)
Hemoglobin: 12.3 g/dL (ref 12.0–15.0)
Immature Granulocytes: 0 %
Lymphocytes Relative: 26 %
Lymphs Abs: 1.7 10*3/uL (ref 0.7–4.0)
MCH: 30.3 pg (ref 26.0–34.0)
MCHC: 33 g/dL (ref 30.0–36.0)
MCV: 91.9 fL (ref 80.0–100.0)
Monocytes Absolute: 0.6 10*3/uL (ref 0.1–1.0)
Monocytes Relative: 9 %
Neutro Abs: 4.2 10*3/uL (ref 1.7–7.7)
Neutrophils Relative %: 64 %
Platelets: 232 10*3/uL (ref 150–400)
RBC: 4.06 MIL/uL (ref 3.87–5.11)
RDW: 12.4 % (ref 11.5–15.5)
WBC: 6.6 10*3/uL (ref 4.0–10.5)
nRBC: 0 % (ref 0.0–0.2)

## 2018-09-11 LAB — BILIRUBIN, DIRECT: Bilirubin, Direct: 0.2 mg/dL (ref 0.0–0.2)

## 2018-09-11 MED ORDER — ACETAMINOPHEN 325 MG PO TABS
650.0000 mg | ORAL_TABLET | Freq: Once | ORAL | Status: AC
  Administered 2018-09-11: 650 mg via ORAL
  Filled 2018-09-11: qty 2

## 2018-09-11 MED ORDER — HEPARIN SOD (PORK) LOCK FLUSH 100 UNIT/ML IV SOLN
500.0000 [IU] | Freq: Once | INTRAVENOUS | Status: AC | PRN
  Administered 2018-09-11: 500 [IU]

## 2018-09-11 MED ORDER — DIPHENHYDRAMINE HCL 25 MG PO CAPS
50.0000 mg | ORAL_CAPSULE | Freq: Once | ORAL | Status: AC
  Administered 2018-09-11: 50 mg via ORAL
  Filled 2018-09-11: qty 2

## 2018-09-11 MED ORDER — DEXAMETHASONE 4 MG PO TABS
8.0000 mg | ORAL_TABLET | Freq: Once | ORAL | Status: AC
  Administered 2018-09-11: 8 mg via ORAL
  Filled 2018-09-11: qty 2

## 2018-09-11 MED ORDER — SODIUM CHLORIDE 0.9 % IV SOLN
Freq: Once | INTRAVENOUS | Status: AC
  Administered 2018-09-11: 10:00:00 via INTRAVENOUS
  Filled 2018-09-11: qty 250

## 2018-09-11 MED ORDER — ZOLEDRONIC ACID 4 MG/5ML IV CONC
3.0000 mg | Freq: Once | INTRAVENOUS | Status: AC
  Administered 2018-09-11: 3 mg via INTRAVENOUS
  Filled 2018-09-11: qty 3.75

## 2018-09-11 MED ORDER — SODIUM CHLORIDE 0.9 % IV SOLN
900.0000 mg | Freq: Once | INTRAVENOUS | Status: AC
  Administered 2018-09-11: 900 mg via INTRAVENOUS
  Filled 2018-09-11: qty 40

## 2018-09-11 NOTE — Progress Notes (Signed)
Here for follow up. Overall stated " feeling wonderful"

## 2018-09-11 NOTE — Progress Notes (Signed)
[facility] day:  09/11/2018   Chief Complaint: [name] is a 77 y.o. female with multiple myeloma who is seen for assessment prior to cycle #12 daratumumab and cycle #3 Velcade.  HPI:  The patient was last seen in the medical oncology clinic on 08/14/2018.  At that time, she remained anxious.  She noted tenderness at an old "spider bite".  Exam was unremarkable.  M-spike was 1.6 (stable).  She received cycle #11 daratumumab and cycle #1 Velcade.  She received cycle #2 Velcade on 08/28/2018.  Symptomatically, patient is doing well "considering everything". Patient states, "I want to cry because I don't want to come all the way down here to get chemo. I wanted to call in sick today".  Patient denies that she has experienced any B symptoms. She denies any interval infections.   Patient making efforts to remain active. She is walking on a daily basis.   Patient advises that she maintains an adequate appetite. She is eating well. Weight today is 125 lb (56.7 kg), which compared to her last visit to the clinic, represents a 3 pound decrease.    Patient denies pain in the clinic today.   Past Medical History:  Diagnosis Date  . Anxiety   . Arthritis   . Asthma   . Cancer (HCC)    THYROID, SKIN, NOSE, BONE  . Chronic kidney disease   . Diverticulosis    Pt reported on 03/15/12  . 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 Eye problems    LOST VISION RIGHT EYE  . Hernia   . History of blood clots    LEG  . Hyperlipidemia   . Hypertension   . Melanoma ([place])   . Melanoma ([place]) 1989   chest  . MGUS (monoclonal gammopathy of unknown significance)   . MGUS (monoclonal gammopathy of unknown significance)   . Mitral valve prolapse   . Perforated bowel ([place])   . Peritonitis ([place])   . Smoldering multiple myeloma ([place])   . Stroke ([place])   . Thyroid disease     Past Surgical History:  Procedure Laterality Date  . ABDOMINAL  HYSTERECTOMY    . BREAST LUMPECTOMY  12/2010   R breast  . BREAST LUMPECTOMY  2004   L axilla neg for cancer.  [name] CARDIOVASCULAR STRESS TEST  2006   NORMAL  . HERNIA REPAIR    . LEG SURGERY     VEIN & BLOOD CLOT  . MELANOMA EXCISION     R eye  . ROTATOR CUFF REPAIR  2004   RIGHT SHOULDER  . SKIN CANCER EXCISION  2011, 2013   squamous cell of nose x 2  . TRANSTHORACIC ECHOCARDIOGRAM  2006    Family History  Problem Relation Age of Onset  . Asthma Father   . Diabetes Father   . Heart failure Father   . Cancer Father        Prostate and kidney cancer  . Emphysema Father   . Diabetes Sister   . Cancer Sister   . Stroke Mother 32       Her mother passed away from this stroke at the age of 64  . Cancer Brother        brain cancer & prostate cancer  . Cervical cancer Sister   . Cancer Sister        Recurrent cancer involving the neck and the  jaw    Social History:  reports that she is a non-smoker but has been exposed to tobacco smoke. She has never used smokeless tobacco. She reports that she does not drink alcohol or use drugs.  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 She ultimately retired from the [employer]. Patient denies known exposures to radiation on toxins.  She lives alone in [place].  The patient is alone today.  Allergies:  Allergies  Allergen Reactions  . Aldactone [Spironolactone] Palpitations    Burning in stomach Burning in stomach Burning in stomach  . Atorvastatin Other (See Comments) and Hives    Patient stated legs hurt so bad she could hardly walk  Patient stated legs hurt so bad she could hardly walk  Patient stated legs hurt so bad she could hardly walk  Patient stated legs hurt so bad she could hardly walk  Patient stated legs hurt so bad she could hardly walk  Patient stated legs hurt so bad she could hardly walk    . Decadrol [Dexamethasone] Anaphylaxis and Palpitations    Other reaction(s): Unknown  . Doxycycline  Other (See Comments)    SEVERE HEADACHES SEVERE HEADACHES  . Fluconazole Swelling    Facial swelling  . Losartan Swelling  . Quinolones Anxiety, Hives and Other (See Comments)    Leg Pain Leg Pain   . Revlimid [Lenalidomide]     Angioedema  . Sertraline Hcl Palpitations  . Hydrocodone Rash and Other (See Comments)  . Levofloxacin Hives    Leg Pain  . Prednisolone Hives and Other (See Comments)    Headache, left eye swelling, forehead swelling Headache, left eye swelling, forehead swelling  . Sertraline Anxiety and Rash  . Tramadol Rash and Other (See Comments)    Headaches  Headaches  Headaches  Headaches  Headaches  Headaches   . Levofloxacin Other (See Comments)    Severe Myalgias  . Prednisone Other (See Comments)  . Pseudoephedrine     stroke  . Pseudoephedrine Hcl Other (See Comments)    Pt states she had a stroke while taking sudafed  . Sertraline Hcl   . Sudafed [Pseudoephedrine Hcl]   . Trazodone And Nefazodone     Side affects  . Trazodone Hcl Other (See Comments)    Side affects Side affects  Side affects  . Hydrocodone-Acetaminophen Anxiety  . Latex Rash  . Tape Rash    Current Medications: Current Outpatient Medications  Medication Sig Dispense Refill  . acetaminophen (TYLENOL) 500 MG tablet Take 250-500 mg by mouth every 6 (six) hours as needed for mild pain, moderate pain, fever or headache.     . ALPRAZolam (XANAX) 0.5 MG tablet Take 1/2-1 tablet up to three times a day as needed for anxiety. 60 tablet 0  . amLODipine (NORVASC) 10 MG tablet Take 10 mg by mouth daily.    [name] aspirin 81 MG tablet Chew 81 mg by mouth at bedtime.     [name] azelastine (ASTELIN) 0.1 % nasal spray Place 1 spray into both nostrils daily as needed for allergies.     . carboxymethylcellulose (REFRESH TEARS) 0.5 % SOLN Place 1 drop into both eyes 3 (three) times daily as needed (for allergies).    . clonazePAM (KLONOPIN) 1 MG tablet Take 1 mg by mouth at bedtime.    .  fexofenadine (ALLEGRA) 180 MG tablet Take 180 mg by mouth daily.     . fluticasone (FLONASE) 50 MCG/ACT nasal spray Place 1 spray into both nostrils  2 (two) times daily. 16 g 11  . guaiFENesin-dextromethorphan (ROBITUSSIN DM) 100-10 MG/5ML syrup Take 5 mLs by mouth every 4 (four) hours as needed for cough. 118 mL 0  . levothyroxine (SYNTHROID, LEVOTHROID) 137 MCG tablet Take 137 mcg by mouth daily before breakfast.     . loperamide (IMODIUM A-D) 2 MG tablet Take 2-4 mg by mouth 4 (four) times daily as needed for diarrhea or loose stools.    [name] MICARDIS 40 MG tablet Take 1 tablet (40 mg total) by mouth daily. 90 tablet 3  . neomycin-polymyxin b-dexamethasone (MAXITROL) 3.5-10000-0.1 OINT APPLY TO LEFT EYE 2 TIMES DAILY  0  . OLANZapine (ZYPREXA) 2.5 MG tablet Take 2.5 mg by mouth at bedtime.    Vladimir Faster Glycol-Propyl Glycol (SYSTANE) 0.4-0.3 % SOLN Place 1 drop into both eyes 2 (two) times daily as needed (for allergies).    . simvastatin (ZOCOR) 20 MG tablet Take 1 tablet (20 mg total) by mouth every evening. 90 tablet 3  . perphenazine (TRILAFON) 2 MG tablet Take 2 mg by mouth at bedtime.     No current facility-administered medications for this visit.    Facility-Administered Medications Ordered in Other Visits  Medication Dose Route Frequency Provider Last Rate Last Dose  . heparin lock flush 100 unit/mL  500 Units Intravenous Once Brunetta Genera, MD      . sodium chloride flush (NS) 0.9 % injection 10 mL  10 mL Intravenous Once Brunetta Genera, MD        Review of Systems  Constitutional: Positive for weight loss (down 3 pounds). Negative for diaphoresis, fever and malaise/fatigue.       Remains active; walks daily.   HENT: Negative.        Chronic rhinorrhea  Eyes: Negative.        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  Respiratory: Positive for cough (chronic). Negative for hemoptysis, sputum production and shortness of breath.   Cardiovascular: Negative  for chest pain, palpitations, orthopnea, leg swelling and PND.  Gastrointestinal: Positive for diarrhea (chronic). Negative for abdominal pain, blood in stool, constipation, melena, nausea and vomiting.  Genitourinary: Negative for dysuria, frequency, hematuria and urgency.  Musculoskeletal: Positive for joint pain (chronic 2/2 OA). Negative for back pain, falls and myalgias.  Skin: Negative for itching and rash.  Neurological: Negative for dizziness, tremors, weakness and headaches.  Endo/Heme/Allergies: Does not bruise/bleed easily.       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 Negative for depression and suicidal ideas. The patient is nervous/anxious (chronic). The patient does not have insomnia.   All other systems reviewed and are negative.  Performance status (ECOG): 1 - Symptomatic but completely ambulatory  Vital Signs BP 137/87 (BP Location: Left Arm, Patient Position: Sitting)   Pulse 76   Temp (!) 96.8 F (36 C) (Tympanic)   Resp 18   Wt 125 lb (56.7 kg)   BMI 23.62 kg/m   Physical Exam  Constitutional: She is oriented to person, place, and time and well-developed, well-nourished, and in no distress. No distress.  HENT:  Head: Normocephalic and atraumatic.  Mouth/Throat: Oropharynx is clear and moist and mucous membranes are normal. No oropharyngeal exudate.  Curly blonde hair.  Eyes: Pupils are equal, round, and reactive to light. Conjunctivae and EOM are normal. No scleral icterus.  Blue eyes.  Neck: Normal range of motion. Neck supple. No JVD present.  Cardiovascular: Normal rate, regular rhythm, normal heart sounds and  intact distal pulses. Exam reveals no gallop and no friction rub.  No murmur heard. Pulmonary/Chest: Effort normal and breath sounds normal. No respiratory distress. She has no wheezes. She has no rales.  Abdominal: Soft. Bowel sounds are normal. She exhibits no distension and no mass. There is no abdominal  tenderness. There is no rebound and no guarding.  Musculoskeletal: Normal range of motion. She exhibits no edema or tenderness.  Lymphadenopathy:    She has no cervical adenopathy.    She has no axillary adenopathy.       Right: No inguinal and no supraclavicular adenopathy present.       Left: No inguinal and no supraclavicular adenopathy present.  Neurological: She is alert and oriented to person, place, and time.  Skin: Skin is warm and dry. No rash noted. She is not diaphoretic. No erythema.  Psychiatric: Affect and judgment normal. Her mood appears anxious.  Nursing note and vitals reviewed.   Infusion on 09/11/2018  Component Date Value Ref Range Status  . Sodium 09/11/2018 141  135 - 145 mmol/L Final  . Potassium 09/11/2018 3.6  3.5 - 5.1 mmol/L Final  . Chloride 09/11/2018 108  98 - 111 mmol/L Final  . CO2 09/11/2018 25  22 - 32 mmol/L Final  . Glucose, Bld 09/11/2018 105* 70 - 99 mg/dL Final  . BUN 09/11/2018 15  8 - 23 mg/dL Final  . Creatinine, Ser 09/11/2018 0.83  0.44 - 1.00 mg/dL Final  . Calcium 09/11/2018 9.3  8.9 - 10.3 mg/dL Final  . Total Protein 09/11/2018 7.9  6.5 - 8.1 g/dL Final  . Albumin 09/11/2018 4.1  3.5 - 5.0 g/dL Final  . AST 09/11/2018 18  15 - 41 U/L Final  . ALT 09/11/2018 12  0 - 44 U/L Final  . Alkaline Phosphatase 09/11/2018 34* 38 - 126 U/L Final  . Total Bilirubin 09/11/2018 1.4* 0.3 - 1.2 mg/dL Final  . GFR calc non Af Amer 09/11/2018 >60  >60 mL/min Final  . GFR calc Af Amer 09/11/2018 >60  >60 mL/min Final  . Anion gap 09/11/2018 8  5 - 15 Final   Performed at [facility], [address] [place], [place] [address]  . WBC 09/11/2018 6.6  4.0 - 10.5 K/uL Final  . RBC 09/11/2018 4.06  3.87 - 5.11 MIL/uL Final  . Hemoglobin 09/11/2018 12.3  12.0 - 15.0 g/dL Final  . HCT 09/11/2018 37.3  36.0 - 46.0 % Final  . MCV 09/11/2018 91.9  80.0 - 100.0 fL Final  . MCH 09/11/2018 30.3  26.0 - 34.0 pg Final  . MCHC 09/11/2018 33.0  30.0 - 36.0  g/dL Final  . RDW 09/11/2018 12.4  11.5 - 15.5 % Final  . Platelets 09/11/2018 232  150 - 400 K/uL Final  . nRBC 09/11/2018 0.0  0.0 - 0.2 % Final  . Neutrophils Relative % 09/11/2018 64  % Final  . Neutro Abs 09/11/2018 4.2  1.7 - 7.7 K/uL Final  . Lymphocytes Relative 09/11/2018 26  % Final  . Lymphs Abs 09/11/2018 1.7  0.7 - 4.0 K/uL Final  . Monocytes Relative 09/11/2018 9  % Final  . Monocytes Absolute 09/11/2018 0.6  0.1 - 1.0 K/uL Final  . Eosinophils Relative 09/11/2018 1  % Final  . Eosinophils Absolute 09/11/2018 0.1  0.0 - 0.5 K/uL Final  . Basophils Relative 09/11/2018 0  % Final  . Basophils Absolute 09/11/2018 0.0  0.0 - 0.1 K/uL Final  . Immature  Granulocytes 09/11/2018 0  % Final  . Abs Immature Granulocytes 09/11/2018 0.01  0.00 - 0.07 K/uL Final   Performed at [facility], [address] [place], [place] [address]    Assessment:  [name] is a 77 y.o. female with IgG lambda multiple myeloma.  She presented in 01/2008 with MGUS.  She required treatment in 12/2013.  She is currently on single agent daratumumab.  Bone marrow aspirate and biopsy on 08/24/2012 revealed a cellularity 40% with 22% plasma cells by aspirate differential, 20% by CD138 immunohistochemistry. The plasma cells were lambda light chain restricted.  Cytogenetics were normal.  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 There was no IGH rearrangement, 17 P deletion or abnormalities involving chromosome 1.   PET scan in 09/2012 revealed no evidence of FDG avid bone lesions.   SPEP has been followed:  2.1 on 08/24/2013, 1.6 on 06/14/2016, 1.6 on 10/05/2016, 2.0 on 03/21/2017, 2.1 on 05/31/2017, 1.8 on 04/03/2018, 1.8 on 05/08/2018, 1.9 on 06/12/2018, 1.6 on 07/17/2018, 1.6 on 08/14/2018, and 1.6 on 09/11/2018.  Lambda free light chains were 687.5 (ratio 0.00) on 07/17/2018.  She began lenalidomide and dexamethasone beginning on ~12/05/2013.  She received ~1 cycle of therapy.  Treatment was stopped secondary to angioedema felt due to lenalidomide.  She began bortezomib and dexamethasone on 01/28/2014.  She began bortizomib, daratumumab, and Decadron on 07/26/2017.  She received 9 cycles.  Velcade was dropped.    She is s/p cycle #11 daratumumab (last 08/14/2018).  She restarted Velcade on 08/14/2018 (last 08/28/2018).  She received Zometa beginning 01/28/2014 every 4 weeks.  She no longer receives Zometa.  Symptomatically, she is doing well overall. She remains active. She denies any acute symptoms. Patient remains chronically anxious. Chronic constipation persists. Exam is grossly unremarkable.  WBC 6600 ([place] 4200).  Total bilirubin elevated 1.4 mg/dL (direct 0.2 mg/dL).  Plan: 1. Labs today:  CBC with diff, CMP, Mg, SPEP, direct bilirubin. 2. Multiple myeloma  Doing well overall. Tolerating treatments with minimal side effects.  Review plans to restart zoledronic acid (Zometa) every 12 weeks.   Recent dental visit - no issues.   Medication has been authorized through insurance.   Amenable to restarting therapy today.  Zometa 3 mg IV today.(next due on 11/05/2018).  Labs reviewed. Blood counts stable and adequate enough for treatment. T-bili elevated. Will proceed with cycle #12 daratumumab, but HOLD cycle #3 Velcade.  Discuss symptom management.  Patient has antiemetics and pain medications at home to use on a PRN basis. Patient  advising that the  prescribed interventions are adequate at this point. Continue all medications as previously prescribed.  3. Elevated total bilirubin  Total bilirubin elevated 1.4 mg/dL (direct 0.2 mg/dL).  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 4. Preventative care  Review need to schedule routine mammogram (last 01/15/2013).  Review plans for repeat colonoscopy (? last 09/30/1994).  Review need for routine bone density study every 2 years. 5. RTC in 2 weeks for labs (CBC with diff, CMP), and Velcade. 6. RTC in  4 weeks for MD assessment, labs (CBC with diff, CMP, SPEP, FLCA), daratumumab + Velcade.   Honor Loh, NP  09/11/2018, 9:21 AM  I saw and evaluated the patient, participating in the key portions of the service and reviewing pertinent diagnostic studies and records.  I reviewed the nurse practitioner's note and agree with the findings and the plan.  The assessment and plan were discussed with the patient.  Multiple questions  were asked by the patient and answered.   Nolon Stalls, MD 09/11/2018,9:21 AM

## 2018-09-12 LAB — PROTEIN ELECTROPHORESIS, SERUM
A/G Ratio: 1.1 (ref 0.7–1.7)
Albumin ELP: 3.9 g/dL (ref 2.9–4.4)
Alpha-1-Globulin: 0.2 g/dL (ref 0.0–0.4)
Alpha-2-Globulin: 0.7 g/dL (ref 0.4–1.0)
Beta Globulin: 2.6 g/dL — ABNORMAL HIGH (ref 0.7–1.3)
Gamma Globulin: 0.1 g/dL — ABNORMAL LOW (ref 0.4–1.8)
Globulin, Total: 3.6 g/dL (ref 2.2–3.9)
M-Spike, %: 1.6 g/dL — ABNORMAL HIGH
Total Protein ELP: 7.5 g/dL (ref 6.0–8.5)

## 2018-09-12 LAB — KAPPA/LAMBDA LIGHT CHAINS
Kappa free light chain: 1.4 mg/L — ABNORMAL LOW (ref 3.3–19.4)
Kappa, lambda light chain ratio: 0 — ABNORMAL LOW (ref 0.26–1.65)
Lambda free light chains: 642.9 mg/L — ABNORMAL HIGH (ref 5.7–26.3)

## 2018-09-13 ENCOUNTER — Telehealth: Payer: Self-pay | Admitting: *Deleted

## 2018-09-13 NOTE — Telephone Encounter (Signed)
Patient called and states she was looking at her paperwork and it say encounter for elevated bilirubin with the other diagnosis. She would like a return call to discuss the elevated bilirubin TODAY as she is going out of town Friday and wants to be able to explain everything to her family. [phone]

## 2018-09-19 ENCOUNTER — Other Ambulatory Visit: Payer: Self-pay | Admitting: Psychiatry

## 2018-09-20 ENCOUNTER — Ambulatory Visit (INDEPENDENT_AMBULATORY_CARE_PROVIDER_SITE_OTHER): Payer: Medicare Other | Admitting: Psychiatry

## 2018-09-20 ENCOUNTER — Encounter: Payer: Self-pay | Admitting: Psychiatry

## 2018-09-20 DIAGNOSIS — G3184 Mild cognitive impairment, so stated: Secondary | ICD-10-CM | POA: Diagnosis not present

## 2018-09-20 DIAGNOSIS — F29 Unspecified psychosis not due to a substance or known physiological condition: Secondary | ICD-10-CM | POA: Diagnosis not present

## 2018-09-20 DIAGNOSIS — F411 Generalized anxiety disorder: Secondary | ICD-10-CM | POA: Diagnosis not present

## 2018-09-20 DIAGNOSIS — F4001 Agoraphobia with panic disorder: Secondary | ICD-10-CM | POA: Diagnosis not present
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
## 2018-09-20 NOTE — Patient Instructions (Addendum)
Take the least amount of alprazolam (Xanax) possible for sleep.  Do not take both in the same day.

## 2018-09-21 ENCOUNTER — Ambulatory Visit: Payer: Self-pay | Admitting: Psychiatry

## 2018-09-21 ENCOUNTER — Encounter

## 2018-09-25 ENCOUNTER — Other Ambulatory Visit: Payer: Self-pay | Admitting: Hematology and Oncology

## 2018-09-25 ENCOUNTER — Inpatient Hospital Stay: Payer: Medicare Other

## 2018-09-25 ENCOUNTER — Telehealth: Payer: Self-pay | Admitting: *Deleted

## 2018-09-25 VITALS — BP 147/90 | HR 83 | Temp 96.6°F | Resp 18

## 2018-09-25 DIAGNOSIS — Z79899 Other long term (current) drug therapy: Secondary | ICD-10-CM | POA: Diagnosis not present

## 2018-09-25 DIAGNOSIS — Z5111 Encounter for antineoplastic chemotherapy: Secondary | ICD-10-CM | POA: Diagnosis not present

## 2018-09-25 DIAGNOSIS — C9000 Multiple myeloma not having achieved remission: Secondary | ICD-10-CM | POA: Diagnosis not present

## 2018-09-25 DIAGNOSIS — K5909 Other constipation: Secondary | ICD-10-CM | POA: Diagnosis not present

## 2018-09-25 LAB — CBC WITH DIFFERENTIAL/PLATELET
Abs Immature Granulocytes: 0.01 10*3/uL (ref 0.00–0.07)
Basophils Absolute: 0 10*3/uL (ref 0.0–0.1)
Basophils Relative: 0 %
Eosinophils Absolute: 0.1 10*3/uL (ref 0.0–0.5)
Eosinophils Relative: 2 %
HCT: 38.7 % (ref 36.0–46.0)
Hemoglobin: 13 g/dL (ref 12.0–15.0)
Immature Granulocytes: 0 %
Lymphocytes Relative: 26 %
Lymphs Abs: 1.9 10*3/uL (ref 0.7–4.0)
MCH: 30.3 pg (ref 26.0–34.0)
MCHC: 33.6 g/dL (ref 30.0–36.0)
MCV: 90.2 fL (ref 80.0–100.0)
Monocytes Absolute: 0.5 10*3/uL (ref 0.1–1.0)
Monocytes Relative: 7 %
Neutro Abs: 4.7 10*3/uL (ref 1.7–7.7)
Neutrophils Relative %: 65 %
Platelets: 269 10*3/uL (ref 150–400)
RBC: 4.29 MIL/uL (ref 3.87–5.11)
RDW: 12 % (ref 11.5–15.5)
WBC: 7.3 10*3/uL (ref 4.0–10.5)
nRBC: 0 % (ref 0.0–0.2)

## 2018-09-25 LAB — COMPREHENSIVE METABOLIC PANEL
ALT: 15 U/L (ref 0–44)
AST: 18 U/L (ref 15–41)
Albumin: 4.2 g/dL (ref 3.5–5.0)
Alkaline Phosphatase: 37 U/L — ABNORMAL LOW (ref 38–126)
Anion gap: 10 (ref 5–15)
BUN: 16 mg/dL (ref 8–23)
CO2: 27 mmol/L (ref 22–32)
Calcium: 10 mg/dL (ref 8.9–10.3)
Chloride: 105 mmol/L (ref 98–111)
Creatinine, Ser: 0.77 mg/dL (ref 0.44–1.00)
GFR calc Af Amer: >60 mL/min (ref >60)
GFR calc non Af Amer: >60 mL/min (ref >60)
Glucose, Bld: 133 mg/dL — ABNORMAL HIGH (ref 70–99)
Potassium: 3.7 mmol/L (ref 3.5–5.1)
Sodium: 142 mmol/L (ref 135–145)
Total Bilirubin: 1.1 mg/dL (ref 0.3–1.2)
Total Protein: 8.2 g/dL — ABNORMAL HIGH (ref 6.5–8.1)

## 2018-09-25 MED ORDER — ONDANSETRON HCL 4 MG PO TABS
8.0000 mg | ORAL_TABLET | Freq: Once | ORAL | Status: AC
  Administered 2018-09-25: 8 mg via ORAL
  Filled 2018-09-25: qty 2

## 2018-09-25 MED ORDER — BORTEZOMIB CHEMO SQ INJECTION 3.5 MG (2.5MG/ML)
1.3000 mg/m2 | Freq: Once | INTRAMUSCULAR | Status: AC
  Administered 2018-09-25: 2 mg via SUBCUTANEOUS
  Filled 2018-09-25: qty 0.8

## 2018-09-25 NOTE — Telephone Encounter (Signed)
Patient called this AM and states she needs an earlier appointment due to only having 1 eye and cannot drive at dark. I spoke with Ellison Hughs by phone to please call patient and see if she can move her appointment up (the patient states no one is returning her calls) Ellison Hughs stated she wouild call patient

## 2018-10-09 DIAGNOSIS — R300 Dysuria: Secondary | ICD-10-CM | POA: Diagnosis not present

## 2018-10-09 DIAGNOSIS — R309 Painful micturition, unspecified: Secondary | ICD-10-CM | POA: Diagnosis not present

## 2018-10-09 DIAGNOSIS — N390 Urinary tract infection, site not specified: Secondary | ICD-10-CM | POA: Diagnosis not present

## 2018-10-09 DIAGNOSIS — N760 Acute vaginitis: Secondary | ICD-10-CM | POA: Diagnosis not present

## 2018-10-10 ENCOUNTER — Encounter: Payer: Self-pay | Admitting: Hematology and Oncology

## 2018-10-10 ENCOUNTER — Inpatient Hospital Stay: Payer: Medicare Other | Attending: Hematology and Oncology

## 2018-10-10 ENCOUNTER — Inpatient Hospital Stay (HOSPITAL_BASED_OUTPATIENT_CLINIC_OR_DEPARTMENT_OTHER): Payer: Medicare Other | Admitting: Hematology and Oncology

## 2018-10-10 ENCOUNTER — Inpatient Hospital Stay: Payer: Medicare Other

## 2018-10-10 VITALS — BP 134/80 | HR 76 | Temp 97.5°F | Resp 18 | Ht 61.0 in | Wt 125.6 lb

## 2018-10-10 DIAGNOSIS — Z5111 Encounter for antineoplastic chemotherapy: Secondary | ICD-10-CM | POA: Diagnosis not present

## 2018-10-10 DIAGNOSIS — C9000 Multiple myeloma not having achieved remission: Secondary | ICD-10-CM

## 2018-10-10 DIAGNOSIS — Z5112 Encounter for antineoplastic immunotherapy: Secondary | ICD-10-CM

## 2018-10-10 DIAGNOSIS — R102 Pelvic and perineal pain: Secondary | ICD-10-CM | POA: Diagnosis not present

## 2018-10-10 DIAGNOSIS — N390 Urinary tract infection, site not specified: Secondary | ICD-10-CM | POA: Diagnosis not present

## 2018-10-10 LAB — COMPREHENSIVE METABOLIC PANEL
ALT: 14 U/L (ref 0–44)
AST: 20 U/L (ref 15–41)
Albumin: 4 g/dL (ref 3.5–5.0)
Alkaline Phosphatase: 35 U/L — ABNORMAL LOW (ref 38–126)
Anion gap: 8 (ref 5–15)
BUN: 17 mg/dL (ref 8–23)
CO2: 27 mmol/L (ref 22–32)
Calcium: 9.5 mg/dL (ref 8.9–10.3)
Chloride: 106 mmol/L (ref 98–111)
Creatinine, Ser: 0.67 mg/dL (ref 0.44–1.00)
GFR calc Af Amer: >60 mL/min (ref >60)
GFR calc non Af Amer: >60 mL/min (ref >60)
Glucose, Bld: 110 mg/dL — ABNORMAL HIGH (ref 70–99)
Potassium: 3.4 mmol/L — ABNORMAL LOW (ref 3.5–5.1)
Sodium: 141 mmol/L (ref 135–145)
Total Bilirubin: 0.9 mg/dL (ref 0.3–1.2)
Total Protein: 7.7 g/dL (ref 6.5–8.1)

## 2018-10-10 LAB — CBC WITH DIFFERENTIAL/PLATELET
Abs Immature Granulocytes: 0.01 10*3/uL (ref 0.00–0.07)
Basophils Absolute: 0 10*3/uL (ref 0.0–0.1)
Basophils Relative: 0 %
Eosinophils Absolute: 0.1 10*3/uL (ref 0.0–0.5)
Eosinophils Relative: 2 %
HCT: 38.4 % (ref 36.0–46.0)
Hemoglobin: 12.4 g/dL (ref 12.0–15.0)
Immature Granulocytes: 0 %
Lymphocytes Relative: 31 %
Lymphs Abs: 1.9 10*3/uL (ref 0.7–4.0)
MCH: 29.3 pg (ref 26.0–34.0)
MCHC: 32.3 g/dL (ref 30.0–36.0)
MCV: 90.8 fL (ref 80.0–100.0)
Monocytes Absolute: 0.7 10*3/uL (ref 0.1–1.0)
Monocytes Relative: 10 %
Neutro Abs: 3.5 10*3/uL (ref 1.7–7.7)
Neutrophils Relative %: 57 %
Platelets: 240 10*3/uL (ref 150–400)
RBC: 4.23 MIL/uL (ref 3.87–5.11)
RDW: 12 % (ref 11.5–15.5)
WBC: 6.3 10*3/uL (ref 4.0–10.5)
nRBC: 0 % (ref 0.0–0.2)

## 2018-10-10 MED ORDER — SODIUM CHLORIDE 0.9% FLUSH
10.0000 mL | Freq: Once | INTRAVENOUS | Status: AC
  Administered 2018-10-10: 10 mL via INTRAVENOUS
  Filled 2018-10-10: qty 10

## 2018-10-10 MED ORDER — HEPARIN SOD (PORK) LOCK FLUSH 100 UNIT/ML IV SOLN
500.0000 [IU] | Freq: Once | INTRAVENOUS | Status: AC
  Administered 2018-10-10: 500 [IU] via INTRAVENOUS
  Filled 2018-10-10: qty 5

## 2018-10-10 MED ORDER — SODIUM CHLORIDE 0.9 % IV SOLN
Freq: Once | INTRAVENOUS | Status: AC
  Administered 2018-10-10: 10:00:00 via INTRAVENOUS
  Filled 2018-10-10: qty 250

## 2018-10-10 MED ORDER — DIPHENHYDRAMINE HCL 25 MG PO CAPS
50.0000 mg | ORAL_CAPSULE | Freq: Once | ORAL | Status: AC
  Administered 2018-10-10: 50 mg via ORAL
  Filled 2018-10-10: qty 2

## 2018-10-10 MED ORDER — DEXAMETHASONE 4 MG PO TABS
8.0000 mg | ORAL_TABLET | Freq: Once | ORAL | Status: AC
  Administered 2018-10-10: 8 mg via ORAL
  Filled 2018-10-10: qty 2

## 2018-10-10 MED ORDER — SODIUM CHLORIDE 0.9 % IV SOLN
900.0000 mg | Freq: Once | INTRAVENOUS | Status: AC
  Administered 2018-10-10: 900 mg via INTRAVENOUS
  Filled 2018-10-10: qty 45

## 2018-10-10 MED ORDER — BORTEZOMIB CHEMO SQ INJECTION 3.5 MG (2.5MG/ML)
1.3000 mg/m2 | Freq: Once | INTRAMUSCULAR | Status: AC
  Administered 2018-10-10: 2 mg via SUBCUTANEOUS
  Filled 2018-10-10: qty 0.8

## 2018-10-10 MED ORDER — ACETAMINOPHEN 325 MG PO TABS
650.0000 mg | ORAL_TABLET | Freq: Once | ORAL | Status: AC
  Administered 2018-10-10: 650 mg via ORAL
  Filled 2018-10-10: qty 2

## 2018-10-10 NOTE — Progress Notes (Signed)
Spoke with Honor Loh, NP, regarding patient having an allergy to dexamethasone, he stated she has been getting that and tolerating well without incident and he removed allergy from chart.    Per Honor Loh, NP/Dr. Mike Gip, OK to transfuse Darzalex at a rapid rate.

## 2018-10-10 NOTE — Progress Notes (Signed)
[facility] day:  10/10/2018   Chief Complaint: [name] is a 78 y.o. female with multiple myeloma who is seen for assessment prior to cycle #13 daratumumab and cycle #4 Velcade.  HPI:  The patient was last seen in the medical oncology clinic on 09/11/2018.  At that time,  she was doing well overall. She remained active. She denied any acute symptoms. Patient remained chronically anxious. Chronic constipation. Exam was grossly unremarkable.  WBC was 6600 ([facility] 4200).  Total bilirubin was elevated 1.4 mg/dL (direct 0.2 mg/dL).  She received cycle #12 daratumumab.  Cycle #3 Velcade was held.  She received Zometa.  She received Velcade on 09/25/2018.  During the interim, she has done well.  She notes issues with burning in her vagina.  Urinalysis was negative.  She notes an intermittent cough and tickle in her throat.  She was prescribed Delsum.  She has had intermittent diarrhea.  She denies any bone pain.   Past Medical History:  Diagnosis Date  . Anxiety   . Arthritis   . Asthma   . Cancer (HCC)    THYROID, SKIN, NOSE, BONE  . Chronic kidney disease   . Diverticulosis    Pt reported on 03/15/12  . 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 Eye problems    LOST VISION RIGHT EYE  . Hernia   . History of blood clots    LEG  . Hyperlipidemia   . Hypertension   . Melanoma ([place])   . Melanoma ([place]) 1989   chest  . MGUS (monoclonal gammopathy of unknown significance)   . MGUS (monoclonal gammopathy of unknown significance)   . Mitral valve prolapse   . Perforated bowel ([place])   . Peritonitis ([place])   . Smoldering multiple myeloma ([place])   . Stroke ([place])   . Thyroid disease     Past Surgical History:  Procedure Laterality Date  . ABDOMINAL HYSTERECTOMY    . BREAST LUMPECTOMY  12/2010   R breast  . BREAST LUMPECTOMY  2004   L axilla neg for cancer.  [name] CARDIOVASCULAR STRESS TEST  2006   NORMAL  . HERNIA REPAIR    . LEG SURGERY      VEIN & BLOOD CLOT  . MELANOMA EXCISION     R eye  . ROTATOR CUFF REPAIR  2004   RIGHT SHOULDER  . SKIN CANCER EXCISION  2011, 2013   squamous cell of nose x 2  . TRANSTHORACIC ECHOCARDIOGRAM  2006    Family History  Problem Relation Age of Onset  . Asthma Father   . Diabetes Father   . Heart failure Father   . Cancer Father        Prostate and kidney cancer  . Emphysema Father   . Diabetes Sister   . Cancer Sister   . Stroke Mother 33       Her mother passed away from this stroke at the age of 102  . Cancer Brother        brain cancer & prostate cancer  . Cervical cancer Sister   . Cancer Sister        Recurrent cancer involving the neck and the jaw    Social History:  reports that she is a non-smoker but has been exposed to tobacco smoke. She has never used smokeless tobacco. She reports that she does not drink alcohol or use drugs.  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 She ultimately retired from the [employer]. Patient denies known exposures to radiation on toxins.  She lives alone in [place].  The patient is accompanied by a friend from church today.  Allergies:  Allergies  Allergen Reactions  . Aldactone [Spironolactone] Palpitations    Burning in stomach Burning in stomach Burning in stomach  . Atorvastatin Other (See Comments) and Hives    Patient stated legs hurt so bad she could hardly walk  Patient stated legs hurt so bad she could hardly walk  Patient stated legs hurt so bad she could hardly walk  Patient stated legs hurt so bad she could hardly walk  Patient stated legs hurt so bad she could hardly walk  Patient stated legs hurt so bad she could hardly walk    . Decadrol [Dexamethasone] Anaphylaxis and Palpitations    Other reaction(s): Unknown  . Doxycycline Other (See Comments)    SEVERE HEADACHES SEVERE HEADACHES  . Fluconazole Swelling    Facial swelling  . Losartan Swelling  . Quinolones Anxiety, Hives and Other  (See Comments)    Leg Pain Leg Pain   . Revlimid [Lenalidomide]     Angioedema  . Sertraline Hcl Palpitations  . Hydrocodone Rash and Other (See Comments)  . Levofloxacin Hives    Leg Pain  . Prednisolone Hives and Other (See Comments)    Headache, left eye swelling, forehead swelling Headache, left eye swelling, forehead swelling  . Sertraline Anxiety and Rash  . Tramadol Rash and Other (See Comments)    Headaches  Headaches  Headaches  Headaches  Headaches  Headaches   . Levofloxacin Other (See Comments)    Severe Myalgias  . Prednisone Other (See Comments)  . Pseudoephedrine     stroke  . Pseudoephedrine Hcl Other (See Comments)    Pt states she had a stroke while taking sudafed  . Sertraline Hcl   . Sudafed [Pseudoephedrine Hcl]   . Trazodone And Nefazodone     Side affects  . Trazodone Hcl Other (See Comments)    Side affects Side affects  Side affects  . Hydrocodone-Acetaminophen Anxiety  . Latex Rash  . Tape Rash    Current Medications: Current Outpatient Medications  Medication Sig Dispense Refill  . amLODipine (NORVASC) 10 MG tablet Take 10 mg by mouth daily.    [name] aspirin 81 MG tablet Chew 81 mg by mouth at bedtime.     . fexofenadine (ALLEGRA) 180 MG tablet Take 180 mg by mouth daily.     . fluticasone (FLONASE) 50 MCG/ACT nasal spray Place 1 spray into both nostrils 2 (two) times daily. 16 g 11  . levothyroxine (SYNTHROID, LEVOTHROID) 137 MCG tablet Take 137 mcg by mouth daily before breakfast.     . MICARDIS 40 MG tablet Take 1 tablet (40 mg total) by mouth daily. 90 tablet 3  . neomycin-polymyxin b-dexamethasone (MAXITROL) 3.5-10000-0.1 OINT APPLY TO LEFT EYE 2 TIMES DAILY  0  . OLANZapine (ZYPREXA) 2.5 MG tablet Take 2.5 mg by mouth at bedtime.    [name] perphenazine (TRILAFON) 2 MG tablet Take 2 mg by mouth at bedtime.    . simvastatin (ZOCOR) 20 MG tablet Take 1 tablet (20 mg total) by mouth every evening. 90 tablet 3  . acetaminophen (TYLENOL) 500 MG  tablet Take 250-500 mg by mouth every 6 (six) hours as needed for mild pain, moderate pain, fever or headache.     . ALPRAZolam (XANAX) 0.5 MG  tablet Take 1/2-1 tablet up to three times a day as needed for anxiety. (Patient not taking: Reported on 10/10/2018) 60 tablet 0  . azelastine (ASTELIN) 0.1 % nasal spray Place 1 spray into both nostrils daily as needed for allergies.     . carboxymethylcellulose (REFRESH TEARS) 0.5 % SOLN Place 1 drop into both eyes 3 (three) times daily as needed (for allergies).    [name] guaiFENesin-dextromethorphan (ROBITUSSIN DM) 100-10 MG/5ML syrup Take 5 mLs by mouth every 4 (four) hours as needed for cough. (Patient not taking: Reported on 10/10/2018) 118 mL 0  . loperamide (IMODIUM A-D) 2 MG tablet Take 2-4 mg by mouth 4 (four) times daily as needed for diarrhea or loose stools.    Vladimir Faster Glycol-Propyl Glycol (SYSTANE) 0.4-0.3 % SOLN Place 1 drop into both eyes 2 (two) times daily as needed (for allergies).     No current facility-administered medications for this visit.    Facility-Administered Medications Ordered in Other Visits  Medication Dose Route Frequency Provider Last Rate Last Dose  . heparin lock flush 100 unit/mL  500 Units Intravenous Once Brunetta Genera, MD      . heparin lock flush 100 unit/mL  500 Units Intravenous Once Corcoran, Melissa C, MD      . sodium chloride flush (NS) 0.9 % injection 10 mL  10 mL Intravenous Once Brunetta Genera, MD        Review of Systems  Constitutional: Negative for chills, diaphoresis, fever, malaise/fatigue and weight loss (stable).  HENT: Negative.  Negative for ear discharge, ear pain, nosebleeds, sinus pain, sore throat and tinnitus.        Chronic rhinorrhea.  Eyes: Negative.  Negative for double vision and pain.       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  Respiratory: Positive for cough (chronic). Negative for hemoptysis, sputum production and shortness of breath.    Cardiovascular: Negative.  Negative for chest pain, palpitations, orthopnea, leg swelling and PND.  Gastrointestinal: Positive for diarrhea (intermittent). Negative for abdominal pain, blood in stool, constipation, melena, nausea and vomiting.  Genitourinary: Negative for frequency, hematuria and urgency.       Vaginal discomfort/burning.  Musculoskeletal: Positive for joint pain (chronic- osteoarthritis). Negative for back pain, falls, myalgias and neck pain.  Skin: Negative.  Negative for itching and rash.  Neurological: Negative.  Negative for dizziness, tremors, speech change, focal weakness, weakness and headaches.  Endo/Heme/Allergies: Does not bruise/bleed easily.       HYPOthyroidism - on levothyroxine.  Psychiatric/Behavioral: Positive for memory loss. Negative for depression. The patient is nervous/anxious (chronic). The patient does not have insomnia.   All other systems reviewed and are negative.  Performance status (ECOG): 1  Vital Signs BP 134/80 (BP Location: Left Arm, Patient Position: Sitting)   Pulse 76   Temp (!) 97.5 F (36.4 C) (Tympanic)   Resp 18   Ht 5' 1"  (1.549 m)   Wt 125 lb 9.6 oz (57 kg)   BMI 23.73 kg/m   Physical Exam  Constitutional: She is oriented to person, place, and time and well-developed, well-nourished, and in no distress. No distress.  HENT:  Head: Normocephalic and atraumatic.  Mouth/Throat: Oropharynx is clear and moist and mucous membranes are normal. No oropharyngeal exudate.  Curly blonde hair.  Eyes: Pupils are equal, round, and reactive to light. Conjunctivae and EOM are normal. No scleral icterus.  Blue eyes.  Neck: Normal range of motion. Neck supple. No JVD present.  Cardiovascular: Normal rate, regular rhythm, normal heart sounds and intact distal pulses. Exam reveals no gallop and no friction rub.  No murmur heard. Pulmonary/Chest: Effort normal and breath sounds normal. No respiratory distress. She has no wheezes. She has no  rales.  Abdominal: Soft. Bowel sounds are normal. She exhibits no distension and no mass. There is no abdominal tenderness. There is no guarding.  Musculoskeletal: Normal range of motion.        General: No tenderness or edema.  Lymphadenopathy:    She has no cervical adenopathy.    She has no axillary adenopathy.       Right: No inguinal and no supraclavicular adenopathy present.       Left: No inguinal and no supraclavicular adenopathy present.  Neurological: She is alert and oriented to person, place, and time. Gait normal.  Skin: Skin is warm and dry. No rash noted. She is not diaphoretic. No erythema. No pallor.  Psychiatric: Affect and judgment normal. Her mood appears anxious.  Nursing note and vitals reviewed.   Infusion on 10/10/2018  Component Date Value Ref Range Status  . Sodium 10/10/2018 141  135 - 145 mmol/L Final  . Potassium 10/10/2018 3.4* 3.5 - 5.1 mmol/L Final  . Chloride 10/10/2018 106  98 - 111 mmol/L Final  . CO2 10/10/2018 27  22 - 32 mmol/L Final  . Glucose, Bld 10/10/2018 110* 70 - 99 mg/dL Final  . BUN 10/10/2018 17  8 - 23 mg/dL Final  . Creatinine, Ser 10/10/2018 0.67  0.44 - 1.00 mg/dL Final  . Calcium 10/10/2018 9.5  8.9 - 10.3 mg/dL Final  . Total Protein 10/10/2018 7.7  6.5 - 8.1 g/dL Final  . Albumin 10/10/2018 4.0  3.5 - 5.0 g/dL Final  . AST 10/10/2018 20  15 - 41 U/L Final  . ALT 10/10/2018 14  0 - 44 U/L Final  . Alkaline Phosphatase 10/10/2018 35* 38 - 126 U/L Final  . Total Bilirubin 10/10/2018 0.9  0.3 - 1.2 mg/dL Final  . GFR calc non Af Amer 10/10/2018 >60  >60 mL/min Final  . GFR calc Af Amer 10/10/2018 >60  >60 mL/min Final  . Anion gap 10/10/2018 8  5 - 15 Final   Performed at [facility], [address] [place], [place] [address]  . WBC 10/10/2018 6.3  4.0 - 10.5 K/uL Final  . RBC 10/10/2018 4.23  3.87 - 5.11 MIL/uL Final  . Hemoglobin 10/10/2018 12.4  12.0 - 15.0 g/dL Final  . HCT 10/10/2018 38.4  36.0 - 46.0 % Final  .  MCV 10/10/2018 90.8  80.0 - 100.0 fL Final  . MCH 10/10/2018 29.3  26.0 - 34.0 pg Final  . MCHC 10/10/2018 32.3  30.0 - 36.0 g/dL Final  . RDW 10/10/2018 12.0  11.5 - 15.5 % Final  . Platelets 10/10/2018 240  150 - 400 K/uL Final  . nRBC 10/10/2018 0.0  0.0 - 0.2 % Final  . Neutrophils Relative % 10/10/2018 57  % Final  . Neutro Abs 10/10/2018 3.5  1.7 - 7.7 K/uL Final  . Lymphocytes Relative 10/10/2018 31  % Final  . Lymphs Abs 10/10/2018 1.9  0.7 - 4.0 K/uL Final  . Monocytes Relative 10/10/2018 10  % Final  . Monocytes Absolute 10/10/2018 0.7  0.1 - 1.0 K/uL Final  . Eosinophils Relative 10/10/2018 2  % Final  . Eosinophils Absolute 10/10/2018 0.1  0.0 - 0.5 K/uL Final  . Basophils Relative 10/10/2018 0  %  Final  . Basophils Absolute 10/10/2018 0.0  0.0 - 0.1 K/uL Final  . Immature Granulocytes 10/10/2018 0  % Final  . Abs Immature Granulocytes 10/10/2018 0.01  0.00 - 0.07 K/uL Final   Performed at [facility], [address] [place], [place] [address]    Assessment:  [name] is a 78 y.o. female with IgG lambda multiple myeloma.  She presented in 01/2008 with MGUS.  She required treatment in 12/2013.  She is currently on single agent daratumumab.  Bone marrow aspirate and biopsy on 08/24/2012 revealed a cellularity 40% with 22% plasma cells by aspirate differential, 20% by CD138 immunohistochemistry. The plasma cells were lambda light chain restricted.  Cytogenetics were normal.  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 There was no IGH rearrangement, 17 P deletion or abnormalities involving chromosome 1.   PET scan in 09/2012 revealed no evidence of FDG avid bone lesions.   SPEP has been followed:  2.1 on 08/24/2013, 1.6 on 06/14/2016, 1.6 on 10/05/2016, 2.0 on 03/21/2017, 2.1 on 05/31/2017, 1.8 on 04/03/2018, 1.8 on 05/08/2018, 1.9 on 06/12/2018, 1.6 on 07/17/2018, 1.6 on 08/14/2018, 1.6 on 09/11/2018, and 1.6 on 10/10/2018.  Lambda free light chains  were 687.5 (ratio 0.00) on 07/17/2018.  She began lenalidomide and dexamethasone beginning on ~12/05/2013.  She received ~1 cycle of therapy. Treatment was stopped secondary to angioedema felt due to lenalidomide.  She began bortezomib and dexamethasone on 01/28/2014.  She began bortizomib, daratumumab, and Decadron on 07/26/2017.  She received 9 cycles.  Velcade was dropped.    She is s/p cycle #12 daratumumab (last 09/11/2018).  She restarted Velcade on 08/14/2018 (last 09/25/2018).  She received Zometa beginning 01/28/2014 every 4 weeks.  She receives Zometa every 12 weeks (last 09/11/2018).  Symptomatically, she notes vaginal discomfort/burining.  Urinalysis was negative.  Exam is stable.  Plan: 1. Labs today:  CBC with diff, CMP, SPEP, FLCA. 2. Multiple myeloma Clinically stable.  M-spike 1.6. Tolerating treatment with no apparent side effect. Renal function stable.  Calcium normal. Continue daratumumab every 4 weeks and Velcade every 2 weeks. Dartumumab today. Velcade today. Patient to received Zometa every 12 weeks (last 09/11/2018). Discuss symptom management.  She has antiemetics and pain medications at home to use on a prn bases.  Interventions are adequate.    3. Elevated total bilirubin Total bilirubin 0.9. History of waxing and waning indirect hyperbilirubinemia. 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 Continue to monitor. 4. Preventative care Last mammogram 01/15/2013. Last colonoscopy 09/30/1994 (?) Last bone density  (unknown). 5.   Vaginal discomfort  Etiology unclear.  Unrelated to current treatment.  Encourage follow-up with PCP or gynecology. 6.   RTC in 2 weeks for labs (CBC with diff, CMP), and Velcade. 7.   RTC in 4 weeks for MD assessment, labs (CBC with diff, CMP, SPEP, FLCA), daratumumab + Velcade   Lequita Asal, MD  10/10/2018, 9:34 AM

## 2018-10-10 NOTE — Progress Notes (Signed)
The patient c/o burning in vaginal area

## 2018-10-11 LAB — KAPPA/LAMBDA LIGHT CHAINS
Kappa free light chain: 1.3 mg/L — ABNORMAL LOW (ref 3.3–19.4)
Kappa, lambda light chain ratio: 0 — ABNORMAL LOW (ref 0.26–1.65)
Lambda free light chains: 658.2 mg/L — ABNORMAL HIGH (ref 5.7–26.3)

## 2018-10-12 DIAGNOSIS — R222 Localized swelling, mass and lump, trunk: Secondary | ICD-10-CM | POA: Diagnosis not present

## 2018-10-12 LAB — MULTIPLE MYELOMA PANEL, SERUM
Albumin SerPl Elph-Mcnc: 4 g/dL (ref 2.9–4.4)
Albumin/Glob SerPl: 1.2 (ref 0.7–1.7)
Alpha 1: 0.3 g/dL (ref 0.0–0.4)
Alpha2 Glob SerPl Elph-Mcnc: 0.6 g/dL (ref 0.4–1.0)
B-Globulin SerPl Elph-Mcnc: 0.9 g/dL (ref 0.7–1.3)
Gamma Glob SerPl Elph-Mcnc: 1.7 g/dL (ref 0.4–1.8)
Globulin, Total: 3.5 g/dL (ref 2.2–3.9)
IgA: <5 mg/dL — ABNORMAL LOW (ref 64–422)
IgG (Immunoglobin G), Serum: 2160 mg/dL — ABNORMAL HIGH (ref 700–1600)
IgM (Immunoglobulin M), Srm: 6 mg/dL — ABNORMAL LOW (ref 26–217)
M Protein SerPl Elph-Mcnc: 1.6 g/dL — ABNORMAL HIGH
Total Protein ELP: 7.5 g/dL (ref 6.0–8.5)

## 2018-10-16 DIAGNOSIS — R350 Frequency of micturition: Secondary | ICD-10-CM | POA: Diagnosis not present

## 2018-10-16 DIAGNOSIS — R300 Dysuria: Secondary | ICD-10-CM | POA: Diagnosis not present

## 2018-10-16 DIAGNOSIS — N3090 Cystitis, unspecified without hematuria: Secondary | ICD-10-CM | POA: Diagnosis not present

## 2018-10-23 ENCOUNTER — Telehealth: Payer: Self-pay | Admitting: *Deleted

## 2018-10-23 ENCOUNTER — Other Ambulatory Visit: Payer: Self-pay | Admitting: Medical

## 2018-10-23 NOTE — Telephone Encounter (Signed)
Received TC from patient whose Oncologist is @ [facility].  She called here today for advice. She states she is experiencing severe vaginal burning. This has been going on for a few days, not going away. Denies fever, chills. No dysuria/diarrhea. Denies any vaginal discharge. She states that the only thing that is helping is soaking in warm water in the bathtub. Pt states she is fearful of having her scheduled Daratumumab tomorrow-she is fearful that it will make it worse.  Informed pt that [facility] has a symptom management clinic as we do here @ [facility]. She could be seen at either place. Pt lives in [place].  Discussed with Symptom management PA, Sandi Mealy. He suggested that she could try monistat cream/troches (over the counter). Pt has fluconazole listed as an allergy (swelling).  He is willing to see her as well but that she could try monistat first to see if that would help. Pt is scheduled @ [facility] tomorrow and can be followed up with then.  Spoke with patient again and offered her both options-being seen here in [facility] or try OTC monistat.  Pt chose the monistat and will discuss with the nurse tomorrow.  Also advised pt to keep areas involved clean and dry, wear cotton underwear, decrease sugar intake,  Eating plain yogurt may help as well. She could try ice packs instead of hot water for comfort. Pt voiced understanding to above.  Message routed to her MD at [facility].

## 2018-10-23 NOTE — Telephone Encounter (Signed)
This same patient had called answering service yesterday at 424 PM with complaints of a hotpoker pain in her vagina, but cancelled the message citing she did not want to wait for a return call because she was going out to a dinner party

## 2018-10-23 NOTE — Telephone Encounter (Signed)
Needs to discuss these concerns with her PCP.

## 2018-10-23 NOTE — Telephone Encounter (Signed)
Patient states she will contact her GYN

## 2018-10-24 ENCOUNTER — Inpatient Hospital Stay: Payer: Medicare Other

## 2018-10-24 ENCOUNTER — Telehealth: Payer: Self-pay | Admitting: *Deleted

## 2018-10-24 ENCOUNTER — Other Ambulatory Visit: Payer: Self-pay | Admitting: *Deleted

## 2018-10-24 ENCOUNTER — Other Ambulatory Visit: Payer: Self-pay | Admitting: Hematology and Oncology

## 2018-10-24 ENCOUNTER — Telehealth: Payer: Self-pay

## 2018-10-24 VITALS — BP 130/75 | HR 82 | Temp 97.3°F | Resp 18

## 2018-10-24 DIAGNOSIS — R300 Dysuria: Secondary | ICD-10-CM

## 2018-10-24 DIAGNOSIS — R350 Frequency of micturition: Secondary | ICD-10-CM

## 2018-10-24 DIAGNOSIS — Z5111 Encounter for antineoplastic chemotherapy: Secondary | ICD-10-CM | POA: Diagnosis not present

## 2018-10-24 DIAGNOSIS — N390 Urinary tract infection, site not specified: Secondary | ICD-10-CM | POA: Diagnosis not present

## 2018-10-24 DIAGNOSIS — C9000 Multiple myeloma not having achieved remission: Secondary | ICD-10-CM | POA: Diagnosis not present

## 2018-10-24 LAB — URINALYSIS, COMPLETE (UACMP) WITH MICROSCOPIC
Bacteria, UA: NONE SEEN
Bilirubin Urine: NEGATIVE
Glucose, UA: NEGATIVE mg/dL
Ketones, ur: NEGATIVE mg/dL
Leukocytes, UA: NEGATIVE
Nitrite: NEGATIVE
Protein, ur: 100 mg/dL — AB
Specific Gravity, Urine: 1.02 (ref 1.005–1.030)
pH: 5 (ref 5.0–8.0)

## 2018-10-24 LAB — CBC WITH DIFFERENTIAL/PLATELET
Abs Immature Granulocytes: 0.01 10*3/uL (ref 0.00–0.07)
Basophils Absolute: 0 10*3/uL (ref 0.0–0.1)
Basophils Relative: 0 %
Eosinophils Absolute: 0.1 10*3/uL (ref 0.0–0.5)
Eosinophils Relative: 1 %
HCT: 40.7 % (ref 36.0–46.0)
Hemoglobin: 13 g/dL (ref 12.0–15.0)
Immature Granulocytes: 0 %
Lymphocytes Relative: 23 %
Lymphs Abs: 1.8 10*3/uL (ref 0.7–4.0)
MCH: 29 pg (ref 26.0–34.0)
MCHC: 31.9 g/dL (ref 30.0–36.0)
MCV: 90.8 fL (ref 80.0–100.0)
Monocytes Absolute: 0.5 10*3/uL (ref 0.1–1.0)
Monocytes Relative: 7 %
Neutro Abs: 5.2 10*3/uL (ref 1.7–7.7)
Neutrophils Relative %: 69 %
Platelets: 257 10*3/uL (ref 150–400)
RBC: 4.48 MIL/uL (ref 3.87–5.11)
RDW: 12.1 % (ref 11.5–15.5)
WBC: 7.6 10*3/uL (ref 4.0–10.5)
nRBC: 0 % (ref 0.0–0.2)

## 2018-10-24 LAB — COMPREHENSIVE METABOLIC PANEL
ALT: 14 U/L (ref 0–44)
AST: 18 U/L (ref 15–41)
Albumin: 4.3 g/dL (ref 3.5–5.0)
Alkaline Phosphatase: 38 U/L (ref 38–126)
Anion gap: 9 (ref 5–15)
BUN: 12 mg/dL (ref 8–23)
CO2: 29 mmol/L (ref 22–32)
Calcium: 9.6 mg/dL (ref 8.9–10.3)
Chloride: 105 mmol/L (ref 98–111)
Creatinine, Ser: 0.73 mg/dL (ref 0.44–1.00)
GFR calc Af Amer: >60 mL/min (ref >60)
GFR calc non Af Amer: >60 mL/min (ref >60)
Glucose, Bld: 81 mg/dL (ref 70–99)
Potassium: 3.4 mmol/L — ABNORMAL LOW (ref 3.5–5.1)
Sodium: 143 mmol/L (ref 135–145)
Total Bilirubin: 1.1 mg/dL (ref 0.3–1.2)
Total Protein: 8.3 g/dL — ABNORMAL HIGH (ref 6.5–8.1)

## 2018-10-24 MED ORDER — BORTEZOMIB CHEMO SQ INJECTION 3.5 MG (2.5MG/ML)
1.3000 mg/m2 | Freq: Once | INTRAMUSCULAR | Status: AC
  Administered 2018-10-24: 2 mg via SUBCUTANEOUS
  Filled 2018-10-24: qty 0.8

## 2018-10-24 MED ORDER — ONDANSETRON HCL 4 MG PO TABS
8.0000 mg | ORAL_TABLET | Freq: Once | ORAL | Status: AC
  Administered 2018-10-24: 8 mg via ORAL
  Filled 2018-10-24: qty 2

## 2018-10-24 NOTE — Telephone Encounter (Signed)
Patient in office for lab/ treatment complaining of a raw bottom. She is not seeing doctor today but would like something for it. Please advise

## 2018-10-24 NOTE — Telephone Encounter (Signed)
Spoke with the patient to inform her that her potassium was running a little at 3.4 and per [place] he would like for the patient to try some banana which = 10 meq of potassium and also try bake potato which is also high in potassium. The patient was agreeable and understanding to try potassium rich foods.

## 2018-10-24 NOTE — Telephone Encounter (Signed)
Patient states she did call her GYN but they could not see her yesterday and that she has seen her Phone call who referred her to another doc, but nothing has been resolved. She reports frequency of urination and burning when she urinates. I discussed with NP Pearline Cables and a urine culture and UA has been ordered. Lab notified.

## 2018-10-25 LAB — URINE CULTURE

## 2018-10-26 DIAGNOSIS — R319 Hematuria, unspecified: Secondary | ICD-10-CM | POA: Diagnosis not present

## 2018-10-26 DIAGNOSIS — R300 Dysuria: Secondary | ICD-10-CM | POA: Diagnosis not present

## 2018-10-26 DIAGNOSIS — R102 Pelvic and perineal pain: Secondary | ICD-10-CM | POA: Diagnosis not present

## 2018-10-31 DIAGNOSIS — D369 Benign neoplasm, unspecified site: Secondary | ICD-10-CM | POA: Diagnosis not present

## 2018-10-31 DIAGNOSIS — F439 Reaction to severe stress, unspecified: Secondary | ICD-10-CM | POA: Diagnosis not present

## 2018-10-31 DIAGNOSIS — C6990 Malignant neoplasm of unspecified site of unspecified eye: Secondary | ICD-10-CM | POA: Diagnosis not present

## 2018-10-31 DIAGNOSIS — C73 Malignant neoplasm of thyroid gland: Secondary | ICD-10-CM | POA: Diagnosis not present

## 2018-10-31 DIAGNOSIS — R319 Hematuria, unspecified: Secondary | ICD-10-CM | POA: Diagnosis not present

## 2018-10-31 DIAGNOSIS — N182 Chronic kidney disease, stage 2 (mild): Secondary | ICD-10-CM | POA: Diagnosis not present

## 2018-10-31 DIAGNOSIS — E039 Hypothyroidism, unspecified: Secondary | ICD-10-CM | POA: Diagnosis not present

## 2018-10-31 DIAGNOSIS — E785 Hyperlipidemia, unspecified: Secondary | ICD-10-CM | POA: Diagnosis not present

## 2018-10-31 DIAGNOSIS — I129 Hypertensive chronic kidney disease with stage 1 through stage 4 chronic kidney disease, or unspecified chronic kidney disease: Secondary | ICD-10-CM | POA: Diagnosis not present

## 2018-10-31 DIAGNOSIS — I8393 Asymptomatic varicose veins of bilateral lower extremities: Secondary | ICD-10-CM | POA: Diagnosis not present

## 2018-10-31 DIAGNOSIS — C9000 Multiple myeloma not having achieved remission: Secondary | ICD-10-CM | POA: Diagnosis not present

## 2018-10-31 DIAGNOSIS — R160 Hepatomegaly, not elsewhere classified: Secondary | ICD-10-CM | POA: Diagnosis not present

## 2018-11-02 ENCOUNTER — Emergency Department (HOSPITAL_COMMUNITY)
Admission: EM | Admit: 2018-11-02 | Discharge: 2018-11-02 | Disposition: A | Discharge status: Home / Self Care | Payer: Medicare Other | Attending: Emergency Medicine | Admitting: Emergency Medicine

## 2018-11-02 ENCOUNTER — Encounter (HOSPITAL_COMMUNITY): Payer: Self-pay | Admitting: Emergency Medicine

## 2018-11-02 ENCOUNTER — Telehealth: Payer: Self-pay | Admitting: *Deleted

## 2018-11-02 DIAGNOSIS — Z91040 Latex allergy status: Secondary | ICD-10-CM | POA: Diagnosis not present

## 2018-11-02 DIAGNOSIS — Z85850 Personal history of malignant neoplasm of thyroid: Secondary | ICD-10-CM | POA: Diagnosis not present

## 2018-11-02 DIAGNOSIS — Z79899 Other long term (current) drug therapy: Secondary | ICD-10-CM | POA: Insufficient documentation

## 2018-11-02 DIAGNOSIS — Z85828 Personal history of other malignant neoplasm of skin: Secondary | ICD-10-CM | POA: Diagnosis not present

## 2018-11-02 DIAGNOSIS — Z7982 Long term (current) use of aspirin: Secondary | ICD-10-CM | POA: Insufficient documentation

## 2018-11-02 DIAGNOSIS — J45909 Unspecified asthma, uncomplicated: Secondary | ICD-10-CM | POA: Insufficient documentation

## 2018-11-02 DIAGNOSIS — F0390 Unspecified dementia without behavioral disturbance: Secondary | ICD-10-CM | POA: Diagnosis not present

## 2018-11-02 DIAGNOSIS — Z7722 Contact with and (suspected) exposure to environmental tobacco smoke (acute) (chronic): Secondary | ICD-10-CM | POA: Diagnosis not present

## 2018-11-02 DIAGNOSIS — Z85840 Personal history of malignant neoplasm of eye: Secondary | ICD-10-CM | POA: Insufficient documentation

## 2018-11-02 DIAGNOSIS — Z8673 Personal history of transient ischemic attack (TIA), and cerebral infarction without residual deficits: Secondary | ICD-10-CM | POA: Insufficient documentation

## 2018-11-02 DIAGNOSIS — R102 Pelvic and perineal pain: Secondary | ICD-10-CM | POA: Diagnosis not present

## 2018-11-02 DIAGNOSIS — N952 Postmenopausal atrophic vaginitis: Secondary | ICD-10-CM | POA: Diagnosis not present

## 2018-11-02 DIAGNOSIS — I129 Hypertensive chronic kidney disease with stage 1 through stage 4 chronic kidney disease, or unspecified chronic kidney disease: Secondary | ICD-10-CM | POA: Diagnosis not present

## 2018-11-02 DIAGNOSIS — N189 Chronic kidney disease, unspecified: Secondary | ICD-10-CM | POA: Insufficient documentation

## 2018-11-02 DIAGNOSIS — E039 Hypothyroidism, unspecified: Secondary | ICD-10-CM | POA: Diagnosis not present

## 2018-11-02 DIAGNOSIS — R52 Pain, unspecified: Secondary | ICD-10-CM | POA: Diagnosis not present

## 2018-11-02 DIAGNOSIS — R300 Dysuria: Secondary | ICD-10-CM | POA: Diagnosis not present

## 2018-11-02 LAB — COMPREHENSIVE METABOLIC PANEL
ALT: 15 U/L (ref 0–44)
AST: 19 U/L (ref 15–41)
Albumin: 4.4 g/dL (ref 3.5–5.0)
Alkaline Phosphatase: 31 U/L — ABNORMAL LOW (ref 38–126)
Anion gap: 9 (ref 5–15)
BUN: 11 mg/dL (ref 8–23)
CO2: 24 mmol/L (ref 22–32)
Calcium: 9.6 mg/dL (ref 8.9–10.3)
Chloride: 104 mmol/L (ref 98–111)
Creatinine, Ser: 0.72 mg/dL (ref 0.44–1.00)
GFR calc Af Amer: >60 mL/min (ref >60)
GFR calc non Af Amer: >60 mL/min (ref >60)
Glucose, Bld: 110 mg/dL — ABNORMAL HIGH (ref 70–99)
POTASSIUM: 3.5 mmol/L (ref 3.5–5.1)
SODIUM: 137 mmol/L (ref 135–145)
Total Bilirubin: 1.2 mg/dL (ref 0.3–1.2)
Total Protein: 7.9 g/dL (ref 6.5–8.1)

## 2018-11-02 LAB — CBC WITH DIFFERENTIAL/PLATELET
Abs Immature Granulocytes: 0.02 10*3/uL (ref 0.00–0.07)
Basophils Absolute: 0 10*3/uL (ref 0.0–0.1)
Basophils Relative: 0 %
Eosinophils Absolute: 0.1 10*3/uL (ref 0.0–0.5)
Eosinophils Relative: 1 %
HCT: 38.8 % (ref 36.0–46.0)
Hemoglobin: 12.7 g/dL (ref 12.0–15.0)
Immature Granulocytes: 0 %
Lymphocytes Relative: 33 %
Lymphs Abs: 2.6 10*3/uL (ref 0.7–4.0)
MCH: 30.1 pg (ref 26.0–34.0)
MCHC: 32.7 g/dL (ref 30.0–36.0)
MCV: 91.9 fL (ref 80.0–100.0)
Monocytes Absolute: 0.7 10*3/uL (ref 0.1–1.0)
Monocytes Relative: 9 %
Neutro Abs: 4.5 10*3/uL (ref 1.7–7.7)
Neutrophils Relative %: 57 %
Platelets: 212 10*3/uL (ref 150–400)
RBC: 4.22 MIL/uL (ref 3.87–5.11)
RDW: 12 % (ref 11.5–15.5)
WBC: 7.9 10*3/uL (ref 4.0–10.5)
nRBC: 0 % (ref 0.0–0.2)

## 2018-11-02 LAB — URINALYSIS, ROUTINE W REFLEX MICROSCOPIC
Bilirubin Urine: NEGATIVE
Glucose, UA: NEGATIVE mg/dL
Ketones, ur: NEGATIVE mg/dL
Leukocytes, UA: NEGATIVE
Nitrite: NEGATIVE
Protein, ur: NEGATIVE mg/dL
Specific Gravity, Urine: 1.003 — ABNORMAL LOW (ref 1.005–1.030)
pH: 7 (ref 5.0–8.0)

## 2018-11-02 LAB — WET PREP, GENITAL
CLUE CELLS WET PREP: NONE SEEN
Sperm: NONE SEEN
TRICH WET PREP: NONE SEEN
WBC, Wet Prep HPF POC: NONE SEEN
YEAST WET PREP: NONE SEEN

## 2018-11-02 MED ORDER — POLYVINYL ALCOHOL 1.4 % OP SOLN
1.0000 [drp] | OPHTHALMIC | Status: DC | PRN
  Administered 2018-11-02: 1 [drp] via OPHTHALMIC
  Filled 2018-11-02: qty 15

## 2018-11-02 MED ORDER — ESTRADIOL 0.1 MG/GM VA CREA: 1.0000 | TOPICAL_CREAM | Freq: Every day | VAGINAL | 12 refills | Status: DC

## 2018-11-02 NOTE — ED Triage Notes (Signed)
Per [facility] pt from home for dysuria for 2 weeks. Pt is cancer patient.

## 2018-11-02 NOTE — Discharge Instructions (Addendum)
Evaluated today for dysuria.  It does seem that you have what is called atrophic vaginitis. I have you a prescription for a vaginal cream.  Please follow the instructions.  Your urinalysis was negative.  We have cultured this.  If it is positive you will be notified.  Your lab work was negative.  Please follow-up with OB/GYN or your PCP over the next 2 days for reevaluation.  Continue your regularly scheduled oncology appointments.  Return to the ED for any worsening symptoms.

## 2018-11-02 NOTE — Telephone Encounter (Signed)
We checked a urine last time for her. It was unrevealing. She needs to go to her PCP for this concern.

## 2018-11-02 NOTE — ED Triage Notes (Signed)
Pt adds that having pains on left side up in vagina.

## 2018-11-02 NOTE — Telephone Encounter (Signed)
Left message on v, to contact PCP for this issue

## 2018-11-02 NOTE — Telephone Encounter (Signed)
Patient called stating that she has horrible pain burning and frequency with urination and that she needs something done because there is no way she can get chemotherapy Tuesday feeling like this. Please advise

## 2018-11-02 NOTE — ED Provider Notes (Signed)
[facility] DEPT Provider Note   CSN: [id number] Arrival date & time: 11/02/18  1513   History   Chief Complaint Chief Complaint  Patient presents with  . Dysuria    HPI [name] is a 78 y.o. female with past medical history significant for multiple myeloma, CKD, hypertension, hyperlipidemia, CVA presents for evaluation of dysuria and vaginal burning.  Patient states symptoms have been present x3 weeks.  Patient has seen her PCP, urgent care as well as her oncologist for symptoms.  Patient states she has had multiple urinalysis with negative findings.  Denies fever, chills, nausea, vomiting, chest pain, shortness of breath, abdominal pain, flank pain, midline back pain, pelvic pain, diarrhea, vaginal discharge, vaginal bleeding, hematuria. Denies recent sexual intercourse or concern for STDs. Was told to take monistat OTC however has not started this treatment.  Denies additional aggravating or alleviating factors.  History obtained from patient. No interpretor was used.  HPI  Past Medical History:  Diagnosis Date  . Anxiety   . Arthritis   . Asthma   . Cancer (HCC)    THYROID, SKIN, NOSE, BONE  . Chronic kidney disease   . Diverticulosis    Pt reported on 03/15/12  . Eye cancer [facility])    R eye cancer - 3-4 y ago  . Eye problems    LOST VISION RIGHT EYE  . Hernia   . History of blood clots    LEG  . Hyperlipidemia   . Hypertension   . Melanoma ([place])   . Melanoma ([place]) 1989   chest  . MGUS (monoclonal gammopathy of unknown significance)   . MGUS (monoclonal gammopathy of unknown significance)   . Mitral valve prolapse   . Perforated bowel ([place])   . Peritonitis ([place])   . Smoldering multiple myeloma ([place])   . Stroke ([place])   . Thyroid disease     Patient Active Problem List   Diagnosis Date Noted  . Encounter for antineoplastic chemotherapy 09/11/2018  . Encounter for antineoplastic immunotherapy 07/17/2018  . Goals of care,  counseling/discussion 07/17/2018  . Weight loss 07/15/2018  . Multiple myeloma not having achieved remission ([place]) 12/20/2017  . Counseling regarding advanced care planning and goals of care 12/20/2017  . Port-A-Cath in place 12/16/2017  . HCAP (healthcare-associated pneumonia) 11/19/2017  . Immunocompromised state due to drug therapy 11/19/2017  . History of CVA (cerebrovascular accident) 11/19/2017  . Hypertension 11/19/2017  . Hyperlipidemia 11/19/2017  . Asthma 11/19/2017  . 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 Hypothyroidism, adult 11/19/2017  . Anxiety disorder 11/19/2017  . Mild dementia ([place]) 11/19/2017  . Periorbital edema 01/22/2015  . Allergic rhinitis 08/12/2014  . Anxiety 07/04/2014  . 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[place]) 07/04/2014  . Multiple myeloma without remission ([place]) 11/01/2013  . Cerebral vascular accident ([place]) 02/19/2013  . Bloodgood disease 02/19/2013  . Acid reflux 02/19/2013  . Blood in the urine 02/19/2013  . Billowing mitral valve 02/19/2013  . Adenomatous colon polyp 02/19/2013  . 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 Cataract, nuclear 12/16/2011  . Intragel vitreous hemorrhage ([place]) 12/16/2011  . Age-related macular degeneration, dry 12/02/2011  . Post-radiation retinopathy 12/02/2011  . Pseudophakia 12/02/2011  . MGUS (monoclonal gammopathy of unknown significance) 11/26/2011  . 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 Extrinsic asthma 09/21/2011  . Colon, diverticulosis 07/26/2011  . Benign hypertensive heart disease without heart failure 03/31/2011  . Hypercholesterolemia 03/31/2011  . Hypothyroidism 03/31/2011  . History  of melanoma 03/31/2011  . Hx of thyroid cancer 03/31/2011  . H/O malignant melanoma of skin 03/31/2011    Past Surgical History:  Procedure Laterality Date  . ABDOMINAL HYSTERECTOMY    . BREAST LUMPECTOMY  12/2010   R breast  . BREAST LUMPECTOMY  2004   L axilla neg for cancer.  [name]  CARDIOVASCULAR STRESS TEST  2006   NORMAL  . HERNIA REPAIR    . LEG SURGERY     VEIN & BLOOD CLOT  . MELANOMA EXCISION     R eye  . ROTATOR CUFF REPAIR  2004   RIGHT SHOULDER  . SKIN CANCER EXCISION  2011, 2013   squamous cell of nose x 2  . TRANSTHORACIC ECHOCARDIOGRAM  2006     OB History   No obstetric history on file.      Home Medications    Prior to Admission medications   Medication Sig Start Date End Date Taking? Authorizing Provider  acetaminophen (TYLENOL) 500 MG tablet Take 250-500 mg by mouth every 6 (six) hours as needed for mild pain, moderate pain, fever or headache.    Yes [provider]  ALPRAZolam Duanne Moron) 0.5 MG tablet Take 1/2-1 tablet up to three times a day as needed for anxiety. Patient taking differently: Take 0.5 mg by mouth at bedtime as needed for anxiety or sleep.  07/11/18  Yes Cottle, Billey Co., MD  amLODipine (NORVASC) 10 MG tablet Take 10 mg by mouth every evening.    Yes [provider]  aspirin 81 MG tablet Chew 81 mg by mouth at bedtime.  07/26/11  Yes [provider]  azelastine (ASTELIN) 0.1 % nasal spray Place 1 spray into both nostrils daily as needed for allergies.  05/08/17  Yes [provider]  carboxymethylcellulose (REFRESH TEARS) 0.5 % SOLN Place 1 drop into both eyes 3 (three) times daily as needed (for allergies).   Yes [provider]  fexofenadine (ALLEGRA) 180 MG tablet Take 180 mg by mouth 2 (two) times daily as needed for allergies.    Yes [provider]  fluticasone (FLONASE) 50 MCG/ACT nasal spray Place 1 spray into both nostrils 2 (two) times daily. Patient taking differently: Place 1 spray into both nostrils 2 (two) times daily as needed for allergies.  07/09/15  Yes Javier Glazier, MD  levothyroxine (SYNTHROID, LEVOTHROID) 137 MCG tablet Take 137 mcg by mouth daily before breakfast.    Yes [provider]  MICARDIS 40 MG tablet Take 1 tablet (40 mg total) by  mouth daily. 01/05/17  Yes Burtis Junes, NP  simvastatin (ZOCOR) 20 MG tablet Take 1 tablet (20 mg total) by mouth every evening. 07/04/18  Yes Fay Records, MD  estradiol (ESTRACE VAGINAL) 0.1 MG/GM vaginal cream Place 1 Applicatorful vaginally at bedtime. 11/02/18   ,  A, PA-C  guaiFENesin-dextromethorphan (ROBITUSSIN DM) 100-10 MG/5ML syrup Take 5 mLs by mouth every 4 (four) hours as needed for cough. Patient not taking: Reported on 10/10/2018 11/23/17   Mariel Aloe, MD  loperamide (IMODIUM A-D) 2 MG tablet Take 2-4 mg by mouth 4 (four) times daily as needed for diarrhea or loose stools.    [provider]  Polyethyl Glycol-Propyl Glycol (SYSTANE) 0.4-0.3 % SOLN Place 1 drop into both eyes 2 (two) times daily as needed (for allergies).    [provider]  prochlorperazine (COMPAZINE) 10 MG tablet Take 1 tablet (10 mg total) by mouth every 6 (six) hours as needed (  Nausea or vomiting). Patient not taking: Reported on 07/10/2018 12/29/17 07/10/18  Brunetta Genera, MD    Family History Family History  Problem Relation Age of Onset  . Asthma Father   . Diabetes Father   . Heart failure Father   . Cancer Father        Prostate and kidney cancer  . Emphysema Father   . Diabetes Sister   . Cancer Sister   . Stroke Mother 52       Her mother passed away from this stroke at the age of 52  . Cancer Brother        brain cancer & prostate cancer  . Cervical cancer Sister   . Cancer Sister        Recurrent cancer involving the neck and the jaw    Social History Social History   Tobacco Use  . Smoking status: Passive Smoke Exposure - Never Smoker  . Smokeless tobacco: Never Used  . Tobacco comment: Exposure rarely through parents but significant exposure at work  Substance Use Topics  . Alcohol use: No    Alcohol/week: 0.0 standard drinks  . Drug use: No     Allergies   Aldactone [spironolactone]; Atorvastatin; Doxycycline; Fluconazole; Losartan;  Quinolones; Revlimid [lenalidomide]; Sertraline hcl; Hydrocodone; Levofloxacin; Prednisolone; Sertraline; Tramadol; Levofloxacin; Prednisone; Pseudoephedrine; Pseudoephedrine hcl; Sertraline hcl; Sudafed [pseudoephedrine hcl]; Trazodone and nefazodone; Trazodone hcl; Hydrocodone-acetaminophen; Latex; and Tape   Review of Systems Review of Systems  Constitutional: Negative.   HENT: Negative.   Respiratory: Negative.   Cardiovascular: Negative.   Gastrointestinal: Negative.   Genitourinary: Positive for dysuria and vaginal pain. Negative for difficulty urinating, flank pain, frequency, genital sores, hematuria, menstrual problem, pelvic pain, vaginal bleeding and vaginal discharge.  Musculoskeletal: Negative.   Skin: Negative.   Neurological: Negative.   All other systems reviewed and are negative.    Physical Exam Updated Vital Signs BP (!) 154/83   Pulse 64   Temp 98.2 F (36.8 C) (Oral)   Resp 18   SpO2 98%   Physical Exam Vitals signs and nursing note reviewed. Exam conducted with a chaperone present.  Constitutional:      General: She is not in acute distress.    Appearance: She is well-developed. She is not ill-appearing or toxic-appearing.     Comments: Patient walking around room on initial evaluation.  Does not appear in any acute distress.  HENT:     Head: Atraumatic.     Nose: Nose normal.     Mouth/Throat:     Comments: Mucous membranes moist.  Posterior oropharynx clear.  Uvula midline without deviation. Eyes:     Pupils: Pupils are equal, round, and reactive to light.  Neck:     Musculoskeletal: Normal range of motion.  Cardiovascular:     Rate and Rhythm: Normal rate.     Pulses: Normal pulses.     Heart sounds: Normal heart sounds.  Pulmonary:     Effort: No respiratory distress.     Comments: Lungs clear to auscultation bilaterally without wheeze, rhonchi or rales.  No accessory muscle usage.  Able speak in full sentences without difficulty. Chest:      Comments: Port placed to right upper chest. No evidence of skin edema, erythema, ecchymosis or warmth. Abdominal:     General: There is no distension.     Comments: Soft, nontender without rebound or guarding.  No CVA tenderness. Normoactive bowel sounds  Genitourinary:    Comments: Pale, dry, shiny vaginal epithelium.  Tenderness to palpation of labial skin. No evidence of cellulitis. No discharge in vaginal vault. No vaginal petechiae. No Odor. Bimanual: nontender. No palpable adnexal masses or tenderness. No uterus present, hx of hysterectomy. Perineal skin intact. Rectovaginal exam was deferred. No pelvic lymphadenopathy noted. Wet prep was obtained. Exam performed with chaperone in room. Musculoskeletal: Normal range of motion.     Comments: Moves all extremities without difficulty. Ambulatory in department without difficulty.  Skin:    General: Skin is warm and dry.  Neurological:     Mental Status: She is alert.      ED Treatments / Results  Labs (all labs ordered are listed, but only abnormal results are displayed) Labs Reviewed  URINALYSIS, ROUTINE W REFLEX MICROSCOPIC - Abnormal; Notable for the following components:      Result Value   Color, Urine STRAW (*)    Specific Gravity, Urine 1.003 (*)    Hgb urine dipstick MODERATE (*)    Bacteria, UA RARE (*)    All other components within normal limits  COMPREHENSIVE METABOLIC PANEL - Abnormal; Notable for the following components:   Glucose, Bld 110 (*)    Alkaline Phosphatase 31 (*)    All other components within normal limits  WET PREP, GENITAL  URINE CULTURE  CBC WITH DIFFERENTIAL/PLATELET    EKG None  Radiology No results found.  Procedures Procedures (including critical care time)  Medications Ordered in ED Medications - No data to display   Initial Impression / Assessment and Plan / ED Course  I have reviewed the triage vital signs and the nursing notes.  Pertinent labs & imaging results that were  available during my care of the patient were reviewed by me and considered in my medical decision making (see chart for details).  78 year old female who appears otherwise well presents for evaluation of dysuria and vaginal burning.  Afebrile, nonseptic, non-ill-appearing.  Patient seen multiple times by PCP, urgent care with negative urine cultures.  Patient states she has burning and tenderness to her bilateral labia.  Metabolic panel without electrolyte, renal or liver abnormalities, CBC without leukocytosis, urinalysis negative for infection, wet prep negative.  GU exam with dry, pale, shiny vaginal epithelium.  Tenderness to palpation to labial skin.  No evidence of cellulitis or infectious process.  Exam consistent with atrophic vaginitis.  Abdomen soft, nontender without rebound or guarding. No CVA tenderness. Patient does not meet the SIRS or Sepsis criteria. On repeat exam patient does not have a surgical abdomin and there are no peritoneal signs. No indication of appendicitis, bowel obstruction, bowel perforation, cholecystitis, diverticulitis. Patient is tolerating p.o. intake without difficulty. Will dc home on estrogen vaginal cream for atrophic vaginitis. Low suspicion for emergent pathology causing patient's symptoms at this time. Will culture urine. Discussed follow up with OB/GYN and PCP for re-evaluation. Patient is hemodynamically stable and appropriate for dc home at this time. Discussed return precautions. Patient voiced understanding and is agreeable for follow up.  Patient seen by my attending Dr, Regenia Skeeter who agrees with above treatment, plan and disposition of patient.   Final Clinical Impressions(s) / ED Diagnoses   Final diagnoses:  Atrophic vaginitis    ED Discharge Orders         Ordered    estradiol (ESTRACE VAGINAL) 0.1 MG/GM vaginal cream  Daily at bedtime     11/02/18 1935           ,  A, PA-C 11/02/18 2346    Sherwood Gambler, MD 11/02/18  2353

## 2018-11-03 ENCOUNTER — Other Ambulatory Visit: Payer: Self-pay | Admitting: Surgery

## 2018-11-03 DIAGNOSIS — R1901 Right upper quadrant abdominal swelling, mass and lump: Secondary | ICD-10-CM | POA: Diagnosis not present

## 2018-11-04 LAB — URINE CULTURE: Culture: <10000 — AB

## 2018-11-07 ENCOUNTER — Inpatient Hospital Stay: Payer: Medicare Other | Admitting: Hematology and Oncology

## 2018-11-07 ENCOUNTER — Ambulatory Visit: Payer: Medicare Other

## 2018-11-07 ENCOUNTER — Other Ambulatory Visit: Payer: Medicare Other

## 2018-11-07 DIAGNOSIS — N898 Other specified noninflammatory disorders of vagina: Secondary | ICD-10-CM | POA: Diagnosis not present

## 2018-11-07 DIAGNOSIS — I10 Essential (primary) hypertension: Secondary | ICD-10-CM | POA: Diagnosis not present

## 2018-11-07 DIAGNOSIS — R300 Dysuria: Secondary | ICD-10-CM | POA: Diagnosis not present

## 2018-11-07 DIAGNOSIS — B373 Candidiasis of vulva and vagina: Secondary | ICD-10-CM | POA: Diagnosis not present

## 2018-11-09 DIAGNOSIS — N952 Postmenopausal atrophic vaginitis: Secondary | ICD-10-CM | POA: Diagnosis not present

## 2018-11-10 ENCOUNTER — Telehealth: Payer: Self-pay | Admitting: *Deleted

## 2018-11-10 DIAGNOSIS — Z01419 Encounter for gynecological examination (general) (routine) without abnormal findings: Secondary | ICD-10-CM | POA: Diagnosis not present

## 2018-11-10 DIAGNOSIS — R300 Dysuria: Secondary | ICD-10-CM | POA: Diagnosis not present

## 2018-11-10 DIAGNOSIS — Z6823 Body mass index (BMI) 23.0-23.9, adult: Secondary | ICD-10-CM | POA: Diagnosis not present

## 2018-11-10 DIAGNOSIS — N958 Other specified menopausal and perimenopausal disorders: Secondary | ICD-10-CM | POA: Diagnosis not present

## 2018-11-10 DIAGNOSIS — N952 Postmenopausal atrophic vaginitis: Secondary | ICD-10-CM | POA: Diagnosis not present

## 2018-11-10 DIAGNOSIS — M8588 Other specified disorders of bone density and structure, other site: Secondary | ICD-10-CM | POA: Diagnosis not present

## 2018-11-10 DIAGNOSIS — R309 Painful micturition, unspecified: Secondary | ICD-10-CM | POA: Diagnosis not present

## 2018-11-10 NOTE — Telephone Encounter (Signed)
Patient called reporting that she will not be coming to her appointment next week because she is having problems with her Vagina and urinary possible infection and she is trying to get admitted to [facility] to see if they can find out what is going on.

## 2018-11-13 ENCOUNTER — Telehealth: Payer: Self-pay | Admitting: *Deleted

## 2018-11-13 NOTE — Telephone Encounter (Signed)
Patient called reporting she went to ER and was told her problem is vaginitis and she needed estrogen which they ordered for her, but she has lost her estrogen and wants a refill called in for it. I explained to her that we do not treat her for that problem and that she needs to contact the doctor who ordered it for her. She states it was at [facility] and she does not know who ordered it,m but she will call the pharmacy for assistance with this. She also was asking about getting an appointment for her chemotherapy,but only if she can get her estrogen for the burniong "down there". I transferred her to scheduler to make an appointment with another provider as she does not want to go to [facility] for treatment.

## 2018-11-14 ENCOUNTER — Telehealth: Payer: Self-pay | Admitting: *Deleted

## 2018-11-14 DIAGNOSIS — N952 Postmenopausal atrophic vaginitis: Secondary | ICD-10-CM | POA: Diagnosis not present

## 2018-11-14 DIAGNOSIS — Z85840 Personal history of malignant neoplasm of eye: Secondary | ICD-10-CM | POA: Diagnosis not present

## 2018-11-14 DIAGNOSIS — J069 Acute upper respiratory infection, unspecified: Secondary | ICD-10-CM | POA: Diagnosis not present

## 2018-11-14 DIAGNOSIS — C9000 Multiple myeloma not having achieved remission: Secondary | ICD-10-CM | POA: Diagnosis not present

## 2018-11-14 NOTE — Telephone Encounter (Signed)
This patient had called on Monday upset that she did not know that Dr. Mike Gip was transitioning to [facility].  Lives in [place] and did not want to go to [place].  Patient likes the cancer center here in [place] because it so neat and clean.  She had to cancel her appointment for 2 4 because she was having GYN issues.  She wants to be set back up with an appointment but here in [place] with Dr. Janese Banks.  First she wanted an appointment this week and I told her that due to the treatment that she gets we did not have any room this week to do it.  She decided that she still having her GYN issues so therefore she would need to get in touch with her GYN so do not do anything this week.  She preferred next week on a Tuesday.  We looked at her chemo schedule there was no chairs available for a 7-hour treatment.  I got the schedulers to try and see anything they could work out the best they could is next Thursday, February 20 at 80 for her port labs following seeing the doctor and then in the infusion for her Darzalex and Velcade.  I called the patient on her cell phone as well as her home phone and got voicemails with both.  Left a message on her home phone explaining that we could not work out the Tuesday but we could try the Thursday, February 20 arrival at 815 for her port labs, see the doctor then get treatment.  Left her with my direct number that she can call us back.  I also asked if she still having her GYN concerns that she call on Wednesday and let us know so we know if we need to  cancel this appointment or not.

## 2018-11-16 ENCOUNTER — Telehealth: Payer: Self-pay | Admitting: *Deleted

## 2018-11-16 NOTE — Telephone Encounter (Signed)
See above.Just FYI.Marland KitchenMarland Kitchen

## 2018-11-16 NOTE — Telephone Encounter (Addendum)
Patient called asking to be seen for UTI. I explained to patient that she needs to see her PCP and she states he has seen her and nothing is getting done / taken care of so I advised that she see a Dealer and she said she will call her wonderful urologist in [place]. She asked if she should keep her appointment Thursday for chemotherapy as she is unsure that she will be well enough to get chemotherapy. I told her to keep appointment and she would see doctor and if deemed not well enough for treatment, she would not get it. She was in agreement with this plan

## 2018-11-17 NOTE — Telephone Encounter (Signed)
Called patient to follow up on her symptoms. She requests [facility] appointment but cannot come today due to illness of her son. She declines to schedule appointment at this time as she is unsure how he will do over the weekend (currently in ICU). I provided her with phone number for clinic and advised her of how to call for [facility] appointment. Patient thanks profusely for call.

## 2018-11-21 ENCOUNTER — Telehealth: Payer: Self-pay | Admitting: *Deleted

## 2018-11-21 ENCOUNTER — Other Ambulatory Visit: Payer: Self-pay | Admitting: *Deleted

## 2018-11-21 DIAGNOSIS — C9000 Multiple myeloma not having achieved remission: Secondary | ICD-10-CM

## 2018-11-21 NOTE — Telephone Encounter (Signed)
I have never seen her before so I would like to see her even if she is not going to get treatment

## 2018-11-21 NOTE — Telephone Encounter (Signed)
Ps called reporting she has a sinus infection with yellow mucous and is going at 4 PM to see Urgent Care and get some antibiotics. She is asking if she soul keep her appointment Thursday for treatment as she is sick.   I note that she is scheduled for abdominal wall mass removal on 11/30/18 by Dr Ninfa Linden.   Please advise

## 2018-11-21 NOTE — Telephone Encounter (Signed)
Patient informed to keep appointment as scheduled

## 2018-11-22 ENCOUNTER — Telehealth: Payer: Self-pay | Admitting: *Deleted

## 2018-11-22 DIAGNOSIS — H02052 Trichiasis without entropian right lower eyelid: Secondary | ICD-10-CM | POA: Diagnosis not present

## 2018-11-22 DIAGNOSIS — H10413 Chronic giant papillary conjunctivitis, bilateral: Secondary | ICD-10-CM | POA: Diagnosis not present

## 2018-11-22 NOTE — Telephone Encounter (Signed)
Patient called today to see if she could see md at 8 am. I told her that she is scheduled for labs 8:15to see md 8:45 and then treatment after she sees the doctor. She said that she called yest. And she is still having problem with vaginal burning and she can't wear panties and she will not take treatment. She will come and get labs and meet Dr. Janese Banks but no treatment. I told her that she tells [place] what she would like to have done and I will cancel the treatment for tom. And she can come in and register at 8 am and will have labs 8:15 and then see Dr. Janese Banks. She wanted to know who put the appt. In and I told her it was me last week when she called and asked for appt. She wants to make sure that she cancels the appt. For the chemo and I told her I would do it and I did cancel it

## 2018-11-23 ENCOUNTER — Other Ambulatory Visit: Payer: Self-pay

## 2018-11-23 ENCOUNTER — Inpatient Hospital Stay (HOSPITAL_BASED_OUTPATIENT_CLINIC_OR_DEPARTMENT_OTHER): Payer: Medicare Other | Admitting: Oncology

## 2018-11-23 ENCOUNTER — Inpatient Hospital Stay: Payer: Medicare Other

## 2018-11-23 ENCOUNTER — Inpatient Hospital Stay: Payer: Medicare Other | Attending: Oncology

## 2018-11-23 ENCOUNTER — Encounter: Payer: Self-pay | Admitting: Oncology

## 2018-11-23 VITALS — BP 169/92 | HR 69 | Temp 97.0°F | Resp 18 | Wt 125.2 lb

## 2018-11-23 DIAGNOSIS — Z79899 Other long term (current) drug therapy: Secondary | ICD-10-CM | POA: Insufficient documentation

## 2018-11-23 DIAGNOSIS — C9000 Multiple myeloma not having achieved remission: Secondary | ICD-10-CM | POA: Insufficient documentation

## 2018-11-23 DIAGNOSIS — R102 Pelvic and perineal pain: Secondary | ICD-10-CM | POA: Diagnosis not present

## 2018-11-23 LAB — COMPREHENSIVE METABOLIC PANEL
ALT: 15 U/L (ref 0–44)
AST: 21 U/L (ref 15–41)
Albumin: 3.9 g/dL (ref 3.5–5.0)
Alkaline Phosphatase: 33 U/L — ABNORMAL LOW (ref 38–126)
Anion gap: 8 (ref 5–15)
BUN: 13 mg/dL (ref 8–23)
CHLORIDE: 103 mmol/L (ref 98–111)
CO2: 27 mmol/L (ref 22–32)
Calcium: 9.6 mg/dL (ref 8.9–10.3)
Creatinine, Ser: 0.77 mg/dL (ref 0.44–1.00)
GFR calc Af Amer: >60 mL/min (ref >60)
GFR calc non Af Amer: >60 mL/min (ref >60)
Glucose, Bld: 75 mg/dL (ref 70–99)
Potassium: 3.8 mmol/L (ref 3.5–5.1)
SODIUM: 138 mmol/L (ref 135–145)
Total Bilirubin: 0.8 mg/dL (ref 0.3–1.2)
Total Protein: 7.7 g/dL (ref 6.5–8.1)

## 2018-11-23 LAB — CBC WITH DIFFERENTIAL/PLATELET
ABS IMMATURE GRANULOCYTES: 0.01 10*3/uL (ref 0.00–0.07)
Basophils Absolute: 0 10*3/uL (ref 0.0–0.1)
Basophils Relative: 0 %
Eosinophils Absolute: 0.1 10*3/uL (ref 0.0–0.5)
Eosinophils Relative: 2 %
HCT: 36.9 % (ref 36.0–46.0)
HEMOGLOBIN: 12.4 g/dL (ref 12.0–15.0)
Immature Granulocytes: 0 %
LYMPHS PCT: 36 %
Lymphs Abs: 2 10*3/uL (ref 0.7–4.0)
MCH: 29.9 pg (ref 26.0–34.0)
MCHC: 33.6 g/dL (ref 30.0–36.0)
MCV: 88.9 fL (ref 80.0–100.0)
Monocytes Absolute: 0.6 10*3/uL (ref 0.1–1.0)
Monocytes Relative: 11 %
Neutro Abs: 2.9 10*3/uL (ref 1.7–7.7)
Neutrophils Relative %: 51 %
Platelets: 216 10*3/uL (ref 150–400)
RBC: 4.15 MIL/uL (ref 3.87–5.11)
RDW: 12.2 % (ref 11.5–15.5)
WBC: 5.7 10*3/uL (ref 4.0–10.5)
nRBC: 0 % (ref 0.0–0.2)

## 2018-11-23 MED ORDER — HEPARIN SOD (PORK) LOCK FLUSH 100 UNIT/ML IV SOLN
500.0000 [IU] | Freq: Once | INTRAVENOUS | Status: AC
  Administered 2018-11-23: 500 [IU] via INTRAVENOUS

## 2018-11-23 MED ORDER — SODIUM CHLORIDE 0.9% FLUSH
10.0000 mL | Freq: Once | INTRAVENOUS | Status: AC
  Administered 2018-11-23: 10 mL via INTRAVENOUS
  Filled 2018-11-23: qty 10
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
## 2018-11-23 NOTE — Progress Notes (Signed)
Here for new pt ( was Dr Mike Gip s pt) stated she is feeling " fuzzy"  ( may be my sinuses ),lost memory pill ( Aricept) ,no pain c/o she stated. Stated full feeling in bladder but claims she was tols by a Dr at [facility] ( name unknown ) told her it was " vaginal dryness and given estrogen cream.   intemittent pain when she urinates" not all the time "

## 2018-11-24 LAB — KAPPA/LAMBDA LIGHT CHAINS
Kappa free light chain: 3.3 mg/L (ref 3.3–19.4)
Kappa, lambda light chain ratio: 0 — ABNORMAL LOW (ref 0.26–1.65)
Lambda free light chains: 729 mg/L — ABNORMAL HIGH (ref 5.7–26.3)

## 2018-11-25 LAB — MULTIPLE MYELOMA PANEL, SERUM
Albumin SerPl Elph-Mcnc: 3.6 g/dL (ref 2.9–4.4)
Albumin/Glob SerPl: 1 (ref 0.7–1.7)
Alpha 1: 0.3 g/dL (ref 0.0–0.4)
Alpha2 Glob SerPl Elph-Mcnc: 0.9 g/dL (ref 0.4–1.0)
B-Globulin SerPl Elph-Mcnc: 2.5 g/dL — ABNORMAL HIGH (ref 0.7–1.3)
Gamma Glob SerPl Elph-Mcnc: 0.1 g/dL — ABNORMAL LOW (ref 0.4–1.8)
Globulin, Total: 3.8 g/dL (ref 2.2–3.9)
IgA: 5 mg/dL — ABNORMAL LOW (ref 64–422)
IgG (Immunoglobin G), Serum: 2313 mg/dL — ABNORMAL HIGH (ref 700–1600)
IgM (Immunoglobulin M), Srm: 6 mg/dL — ABNORMAL LOW (ref 26–217)
M Protein SerPl Elph-Mcnc: 1.8 g/dL — ABNORMAL HIGH
Total Protein ELP: 7.4 g/dL (ref 6.0–8.5)

## 2018-11-27 ENCOUNTER — Other Ambulatory Visit: Payer: Self-pay | Admitting: Psychiatry

## 2018-11-27 ENCOUNTER — Telehealth: Payer: Self-pay | Admitting: *Deleted

## 2018-11-27 DIAGNOSIS — D472 Monoclonal gammopathy: Secondary | ICD-10-CM

## 2018-11-27 DIAGNOSIS — R300 Dysuria: Secondary | ICD-10-CM | POA: Diagnosis not present

## 2018-11-27 DIAGNOSIS — B373 Candidiasis of vulva and vagina: Secondary | ICD-10-CM | POA: Diagnosis not present

## 2018-11-27 NOTE — Progress Notes (Signed)
Spoke with Hassan Rowan, at [facility], to make MD aware that pt stated that she was cancelling surgery due to " an infection caused by chemo."

## 2018-11-27 NOTE — Telephone Encounter (Signed)
We can refer her to GYN.

## 2018-11-27 NOTE — Telephone Encounter (Signed)
Patient called asking what to do regarding the fact that she is still having vaginal burning when she urinates and wants [place] to refer her to a doctor that can help her. She is concerned that she will not be able to get chemotherapy again this week. Please advise

## 2018-11-28 ENCOUNTER — Telehealth: Payer: Self-pay | Admitting: *Deleted

## 2018-11-28 ENCOUNTER — Other Ambulatory Visit: Payer: Self-pay | Admitting: *Deleted

## 2018-11-28 ENCOUNTER — Other Ambulatory Visit: Payer: Self-pay | Admitting: Psychiatry

## 2018-11-28 DIAGNOSIS — R3121 Asymptomatic microscopic hematuria: Secondary | ICD-10-CM | POA: Diagnosis not present

## 2018-11-28 DIAGNOSIS — R1011 Right upper quadrant pain: Secondary | ICD-10-CM

## 2018-11-28 DIAGNOSIS — R102 Pelvic and perineal pain: Secondary | ICD-10-CM

## 2018-11-28 DIAGNOSIS — C9000 Multiple myeloma not having achieved remission: Secondary | ICD-10-CM

## 2018-11-28 DIAGNOSIS — D472 Monoclonal gammopathy: Secondary | ICD-10-CM

## 2018-11-28 NOTE — Telephone Encounter (Signed)
Called patient to see how she was doing today and if she thinks she will be able to make it to her chemotherapy appointment on Thursday.  Patient is currently driving her car to go see her son who is in the [facility] who just had double lung surgery.  He states that she went to see a doctor in [place] [facility] today and they told her to continue taking her estrogen cream.  Visor to take it every day she just put some in before she left to come to the hospital he says the cream does help but she still has the vaginal pain.  Tried talking to her about possibility of a CT scan but she was driving and she did not want to be distracted and she wants me to call her back tomorrow morning. I will call her back as requested

## 2018-11-29 ENCOUNTER — Telehealth: Payer: Self-pay | Admitting: *Deleted

## 2018-11-29 DIAGNOSIS — G4700 Insomnia, unspecified: Secondary | ICD-10-CM | POA: Diagnosis not present

## 2018-11-29 DIAGNOSIS — F0390 Unspecified dementia without behavioral disturbance: Secondary | ICD-10-CM | POA: Diagnosis not present

## 2018-11-29 DIAGNOSIS — E039 Hypothyroidism, unspecified: Secondary | ICD-10-CM | POA: Diagnosis not present

## 2018-11-29 NOTE — Telephone Encounter (Signed)
Called patient today to see if she was feeling better and if she would be able to make her appointment to chemo tomorrow.  Tells me that her son is sick and in the hospital and had a lung surgery.  She tells me that she knows nothing about the appointment.  I told her that I spoke with her yesterday and I told her that I would call her on Tuesday to see you how she is feeling to know if she can come to her appointment on Thursday.  I also called her last week and told her about the appointment as well as putting it in the mail.  Patient states she does not remember me talking to her at all and she has not gotten anything in the mail.  She states that she went to [facility] ER and spent 4 hours yesterday.  She feels like all the problems that she is having with her vaginal pain is coming from her chemotherapy side effects.  I told her that I looked up the medications and neither 1 of them calls vaginal problems.  I told her that it is less than 1% that she can even get a UTI from the Velcade.  She starts getting tired she says she has not ate all day as she brought her supper but she did not eat it yet so I told her I would let her eat her supper and I would call her back.  I came back to my desk the patient had called and left me a message stating that she has a chemo alert card and 1 of the symptoms is UTI that she should immediately come to the ER.  And why would someone make this chemo alert card if it was not a symptom of 1 of the drugs that she is taken.  I told her that she had already been to her primary care and they did a UA and a culture and she did not have a urinary tract infection at all. And she had been sent to the GYN doctor's office as well as a ER visit.  She was prescribed estrogen cream.  The patient tells me I am incorrect.  I guess she will go to [facility] care about taking care of her.  She tells me that the doctor that saw her yesterday has set up a scan for her for March 7 to see  about her vaginal pain.  I told her that I am looking in the computer system and I do not see an appointment for that.  She tells me that it had not been done yet and that is why you do not see it.  I told her that any the appointments that have been scheduled have already been set up in the computer and I can see it under her visit status and is not there.  I asked her if she could tell me the doctor that she saw I be glad to call them and see what is going on and what they recommended.  She tells me that she does not think I care about her again.  I told her that I am willing to help her and that Dr. Judithann Graves is willing to do a CT scan, if she would like.  Then she tells me that I can figure out the doctor and I can figure out the scan myself and give her an update tomorrow because she is tired for today.  I cannot  find anything in the computer so I called [facility] department myself and spoke to staff and the last time she is ever been in the ER there was January 30.  Will call her back in the morning as she requested however wherever she went it was not [facility].

## 2018-11-30 ENCOUNTER — Ambulatory Visit (HOSPITAL_COMMUNITY): Admission: RE | Admit: 2018-11-30 | Payer: Medicare Other | Source: Home / Self Care | Admitting: Surgery

## 2018-11-30 ENCOUNTER — Inpatient Hospital Stay: Payer: Medicare Other

## 2018-11-30 ENCOUNTER — Telehealth: Payer: Self-pay | Admitting: *Deleted

## 2018-11-30 ENCOUNTER — Inpatient Hospital Stay: Payer: Medicare Other | Admitting: Oncology

## 2018-11-30 SURGERY — EXCISION MASS
Anesthesia: Monitor Anesthesia Care | Laterality: Right

## 2018-11-30 NOTE — Telephone Encounter (Signed)
[facility]
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
## 2018-12-01 DIAGNOSIS — H10413 Chronic giant papillary conjunctivitis, bilateral: Secondary | ICD-10-CM | POA: Diagnosis not present

## 2018-12-01 DIAGNOSIS — H10503 Unspecified blepharoconjunctivitis, bilateral: Secondary | ICD-10-CM | POA: Diagnosis not present

## 2018-12-02 DIAGNOSIS — G4700 Insomnia, unspecified: Secondary | ICD-10-CM | POA: Diagnosis not present

## 2018-12-04 ENCOUNTER — Ambulatory Visit: Payer: Medicare Other | Admitting: Diagnostic Neuroimaging

## 2018-12-04 ENCOUNTER — Telehealth: Payer: Self-pay | Admitting: *Deleted

## 2018-12-04 NOTE — Telephone Encounter (Signed)
Patient called this afternoon and canceled, rescheduled her FU this afternoon. She is sick. She was rescheduled for soonest and put on wait list per phone staff.

## 2018-12-05 ENCOUNTER — Encounter: Payer: Self-pay | Admitting: Diagnostic Neuroimaging

## 2018-12-05 DIAGNOSIS — N952 Postmenopausal atrophic vaginitis: Secondary | ICD-10-CM | POA: Diagnosis not present

## 2018-12-05 DIAGNOSIS — R3121 Asymptomatic microscopic hematuria: Secondary | ICD-10-CM | POA: Diagnosis not present

## 2018-12-05 DIAGNOSIS — R311 Benign essential microscopic hematuria: Secondary | ICD-10-CM | POA: Diagnosis not present

## 2018-12-06 ENCOUNTER — Telehealth: Payer: Self-pay | Admitting: *Deleted

## 2018-12-06 NOTE — Telephone Encounter (Signed)
Patient called today and left a message that she went to [facility] hospital yesterday and she has a vagina infection and she is being treated for it.  She really wants to get her chemotherapy and when her vagina infection is gone she will call us back and we will reschedule her to have her treatment for her multiple myeloma.  I called patient back and got her voicemail and left her message thanking her for her update and whenever she is feeling well she can give [place] a call and would be happy to reschedule her infusion.

## 2018-12-07 ENCOUNTER — Telehealth: Payer: Self-pay | Admitting: Diagnostic Neuroimaging

## 2018-12-07 NOTE — Telephone Encounter (Signed)
FYI Pt has called asking that Dr Leta Baptist be made aware that she regrets missing her previous appointment but she was in the hospital.  Pt states she likes  Dr Leta Baptist and doesn't want him to think she carelessly missed the appointment.  No call back requested

## 2018-12-08 ENCOUNTER — Telehealth: Payer: Self-pay | Admitting: *Deleted

## 2018-12-08 NOTE — Telephone Encounter (Signed)
Patient called today and left a message that she wanted to clear up some confusion that she had told me previously.  Did not go to [facility] last week she actually went to the building beside of it and saw her urologist Dr. Gloriann Loan.  He has ordered a CT scan for her that she is going to have on this coming Monday.  She really wants to get back in and get her chemo treatment but she does not want to do it until her vagina/urinary problems have been taking care of.  I called the patient back and let her know that I got her message and I am so glad that she went to her urologist and that he is going to be doing a CT and when she gets to where she feels like she can take her chemo treatment if she could please call and we will make her an appointment to come in and get her treatment then.  She states that she will call me back when she is all better she does not want a too long to go without getting her chemo but she will contact me and we will set her up with an appointment at the time she feels like she is clear from vagina/ urology issues and ready to get her treatment

## 2018-12-11 DIAGNOSIS — R3129 Other microscopic hematuria: Secondary | ICD-10-CM | POA: Diagnosis not present

## 2018-12-11 DIAGNOSIS — R3121 Asymptomatic microscopic hematuria: Secondary | ICD-10-CM | POA: Diagnosis not present

## 2018-12-13 ENCOUNTER — Telehealth: Payer: Self-pay | Admitting: *Deleted

## 2018-12-13 DIAGNOSIS — F4310 Post-traumatic stress disorder, unspecified: Secondary | ICD-10-CM | POA: Diagnosis not present

## 2018-12-13 DIAGNOSIS — F329 Major depressive disorder, single episode, unspecified: Secondary | ICD-10-CM | POA: Diagnosis not present

## 2018-12-13 DIAGNOSIS — Z79899 Other long term (current) drug therapy: Secondary | ICD-10-CM | POA: Diagnosis not present

## 2018-12-13 NOTE — Telephone Encounter (Signed)
Called patient back to let her know that we have set her up for an appointment next week which will be March 19 which is a Thursday she will need to arrive at the cancer center at 815 then she will have lab work see the doctor and get a treatment and she will be here all day because her treatment is long.  I did tell her that as her request she wanted to see if she can go to [facility] because it is closer to her house and she would be able to drive instead of hiring people and paying them to drive her down to [place].  She wrote it down in her calendar and she would like me to let her know when I found out anything about [facility] it being there.  I told her I would

## 2018-12-16 IMAGING — CT NM PET IMAGE RESTAGE (PS) WHOLE BODY
1 of 8 series · 3 of 25 positions shown · non-contrast
Comparison: PET-CT 10/02/2012, chest CT 11/19/2017 and
abdominopelvic CT 05/21/2017. Head CT 05/21/2017 and 07/27/2017.

CLINICAL DATA: Subsequent treatment strategy for multiple myeloma.
History of ocular melanoma and thyroid cancer.

EXAM:
NUCLEAR MEDICINE PET WHOLE BODY
TECHNIQUE: 6.36 mCi F-18 FDG was injected intravenously. Full-ring PET imaging
was performed from the skull base to thigh after the radiotracer. CT
data was obtained and used for attenuation correction and anatomic
localization.
Fasting blood glucose: 103 mg/dl
Mediastinal blood pool activity: SUV max

[Series 4: ct wb 5.0 hd_fov · axial · 5.0mm · 1.17mm/px · z∈[+174,+1010]mm · 3 of 419 slices shown]
[im 105/419  soft-tissue]
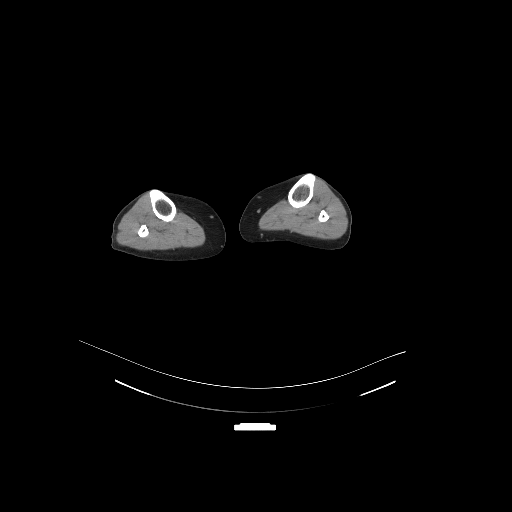
[im 210/419  soft-tissue]
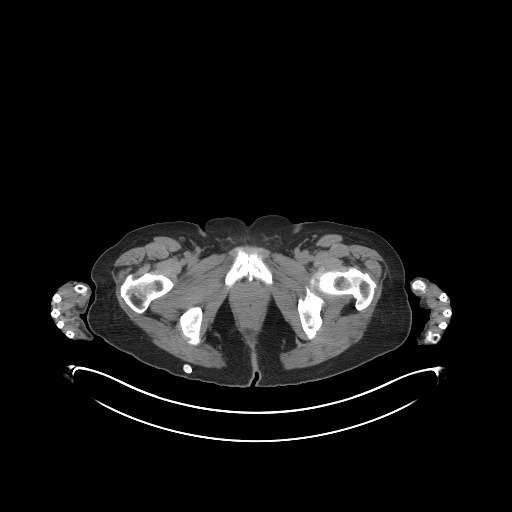
[im 314/419  soft-tissue]
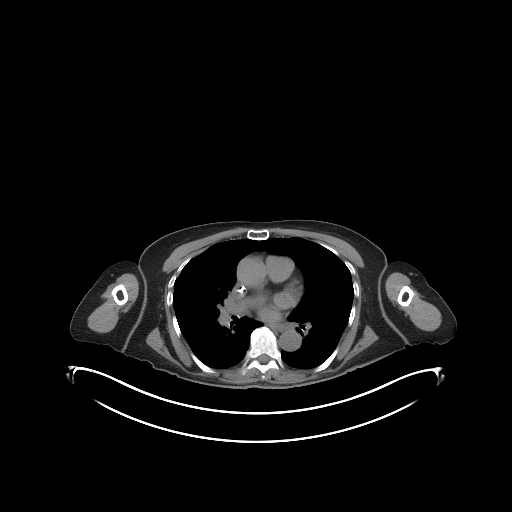

[3 of 25 positions shown; findings below may reference images not displayed]

FINDINGS: HEAD/NECK:

No hypermetabolic cervical lymph nodes are identified.There are no
lesions of the pharyngeal mucosal space. There is physiologic
activity associated with the muscles of phonation. No abnormal
intracranial or orbital activity.

Incidental CT findings: Stable calcification along the superior
aspect of the right globe.

CHEST:

There are no hypermetabolic mediastinal, hilar or axillary lymph
nodes. No hypermetabolic pulmonary activity.

Incidental CT findings: There are calcified mediastinal and right
hilar lymph nodes right IJ Port-A-Cath extends to the superior
cavoatrial junction. There is mild atherosclerosis of the aorta,
great vessels and coronary arteries. The overall pulmonary aeration
has improved. There is mild linear scarring or atelectasis at both
lung bases. There is a small calcified right upper lobe granuloma.

ABDOMEN/PELVIS:

There is no hypermetabolic activity within the liver, adrenal
glands, spleen or pancreas. There is no hypermetabolic nodal
activity.

Incidental CT findings: The previously demonstrated presumed
hematoma peripherally in the right hepatic lobe adjacent to a biopsy
clip has decreased in size, measuring 3.5 x 2.3 cm on image 127/4.
There is no associated hypermetabolic activity. Other tiny hepatic
lesions are grossly stable. Postsurgical changes from previous
anterior abdominal wall hernia repair.

SKELETON:

There is no hypermetabolic activity to suggest osseous metastatic
disease.

Incidental CT findings: No lytic lesions or acute fractures are
seen. There are glenohumeral degenerative changes bilaterally.
Degenerative changes are present within the lumbar spine.

EXTREMITIES:

No suspicious hypermetabolic activity within the extremities. There
is some muscular activity within the left hand which is within
physiologic limits.

Incidental CT findings: none
IMPRESSION: 1. No hypermetabolic or lytic osseous lesions demonstrated.
2. No abnormal hypermetabolic soft tissue activity.
3. Stable calcification within the right globe.
4. Decreased size of previously demonstrated hepatic fluid
collection.
5.  Aortic Atherosclerosis ([id number].X).

## 2018-12-18 ENCOUNTER — Other Ambulatory Visit: Payer: Self-pay | Admitting: *Deleted

## 2018-12-18 DIAGNOSIS — J45909 Unspecified asthma, uncomplicated: Secondary | ICD-10-CM | POA: Diagnosis not present

## 2018-12-18 DIAGNOSIS — Z888 Allergy status to other drugs, medicaments and biological substances status: Secondary | ICD-10-CM | POA: Diagnosis not present

## 2018-12-18 DIAGNOSIS — Z880 Allergy status to penicillin: Secondary | ICD-10-CM | POA: Diagnosis not present

## 2018-12-18 DIAGNOSIS — C9000 Multiple myeloma not having achieved remission: Secondary | ICD-10-CM | POA: Diagnosis not present

## 2018-12-18 DIAGNOSIS — Z885 Allergy status to narcotic agent status: Secondary | ICD-10-CM | POA: Diagnosis not present

## 2018-12-18 DIAGNOSIS — Z7982 Long term (current) use of aspirin: Secondary | ICD-10-CM | POA: Diagnosis not present

## 2018-12-18 DIAGNOSIS — N281 Cyst of kidney, acquired: Secondary | ICD-10-CM | POA: Diagnosis not present

## 2018-12-18 DIAGNOSIS — Z7951 Long term (current) use of inhaled steroids: Secondary | ICD-10-CM | POA: Diagnosis not present

## 2018-12-18 DIAGNOSIS — Z8673 Personal history of transient ischemic attack (TIA), and cerebral infarction without residual deficits: Secondary | ICD-10-CM | POA: Diagnosis not present

## 2018-12-18 DIAGNOSIS — E039 Hypothyroidism, unspecified: Secondary | ICD-10-CM | POA: Diagnosis not present

## 2018-12-18 DIAGNOSIS — K7689 Other specified diseases of liver: Secondary | ICD-10-CM | POA: Diagnosis not present

## 2018-12-18 DIAGNOSIS — D734 Cyst of spleen: Secondary | ICD-10-CM | POA: Diagnosis not present

## 2018-12-18 DIAGNOSIS — Z9225 Personal history of immunosupression therapy: Secondary | ICD-10-CM | POA: Diagnosis not present

## 2018-12-18 DIAGNOSIS — Z6822 Body mass index (BMI) 22.0-22.9, adult: Secondary | ICD-10-CM | POA: Diagnosis not present

## 2018-12-18 DIAGNOSIS — E7800 Pure hypercholesterolemia, unspecified: Secondary | ICD-10-CM | POA: Diagnosis not present

## 2018-12-18 DIAGNOSIS — I119 Hypertensive heart disease without heart failure: Secondary | ICD-10-CM | POA: Diagnosis not present

## 2018-12-18 DIAGNOSIS — R3915 Urgency of urination: Secondary | ICD-10-CM | POA: Diagnosis not present

## 2018-12-18 DIAGNOSIS — Z91410 Personal history of adult physical and sexual abuse: Secondary | ICD-10-CM | POA: Diagnosis not present

## 2018-12-18 DIAGNOSIS — F419 Anxiety disorder, unspecified: Secondary | ICD-10-CM | POA: Diagnosis not present

## 2018-12-18 DIAGNOSIS — R102 Pelvic and perineal pain: Secondary | ICD-10-CM | POA: Diagnosis not present

## 2018-12-18 DIAGNOSIS — Z79899 Other long term (current) drug therapy: Secondary | ICD-10-CM | POA: Diagnosis not present

## 2018-12-18 DIAGNOSIS — K862 Cyst of pancreas: Secondary | ICD-10-CM | POA: Diagnosis not present

## 2018-12-18 DIAGNOSIS — R600 Localized edema: Secondary | ICD-10-CM | POA: Diagnosis not present

## 2018-12-18 DIAGNOSIS — Z79890 Hormone replacement therapy (postmenopausal): Secondary | ICD-10-CM | POA: Diagnosis not present

## 2018-12-18 DIAGNOSIS — C801 Malignant (primary) neoplasm, unspecified: Secondary | ICD-10-CM | POA: Diagnosis not present

## 2018-12-18 DIAGNOSIS — C9002 Multiple myeloma in relapse: Secondary | ICD-10-CM | POA: Diagnosis not present

## 2018-12-18 DIAGNOSIS — Z881 Allergy status to other antibiotic agents status: Secondary | ICD-10-CM | POA: Diagnosis not present

## 2018-12-20 ENCOUNTER — Telehealth: Payer: Self-pay | Admitting: *Deleted

## 2018-12-20 DIAGNOSIS — R3121 Asymptomatic microscopic hematuria: Secondary | ICD-10-CM | POA: Diagnosis not present

## 2018-12-20 NOTE — Telephone Encounter (Signed)
Called patient to check on her to make sure her vagina issues has gotten better and that she will be coming to her appointment tomorrow for her myeloma treatment.  Patient answered her cell phone and she said she was in the urology  office at this time waiting for the doctor to come in. patient states that she forgot to call me but she went to see her [facility] doctor that she used to see a while ago and he told her that she needs to go ahead and get her treatment so they gave her her Darzalex at [facility].  They have given her a appointment for a month from now, set her up to see a GYN doctor there, also making her an appointment to see a psychiatrist for her anxiety.  The patient states that she is not sure if she is going to go back to [facility] or not but she will call us in the future if she feels like she needs to get treatment here

## 2018-12-21 ENCOUNTER — Inpatient Hospital Stay: Payer: Medicare Other

## 2018-12-21 ENCOUNTER — Inpatient Hospital Stay: Payer: Medicare Other | Admitting: Oncology

## 2018-12-24 ENCOUNTER — Encounter: Payer: Self-pay | Admitting: Hematology and Oncology

## 2019-01-15 DIAGNOSIS — Z85820 Personal history of malignant melanoma of skin: Secondary | ICD-10-CM | POA: Diagnosis not present

## 2019-01-15 DIAGNOSIS — C9002 Multiple myeloma in relapse: Secondary | ICD-10-CM | POA: Diagnosis not present

## 2019-01-15 DIAGNOSIS — Z5112 Encounter for antineoplastic immunotherapy: Secondary | ICD-10-CM | POA: Diagnosis not present

## 2019-02-09 DIAGNOSIS — C9000 Multiple myeloma not having achieved remission: Secondary | ICD-10-CM | POA: Diagnosis not present

## 2019-02-09 DIAGNOSIS — Z8673 Personal history of transient ischemic attack (TIA), and cerebral infarction without residual deficits: Secondary | ICD-10-CM | POA: Diagnosis not present

## 2019-02-09 DIAGNOSIS — Z7982 Long term (current) use of aspirin: Secondary | ICD-10-CM | POA: Diagnosis not present

## 2019-02-09 DIAGNOSIS — I10 Essential (primary) hypertension: Secondary | ICD-10-CM | POA: Diagnosis not present

## 2019-02-09 DIAGNOSIS — E7800 Pure hypercholesterolemia, unspecified: Secondary | ICD-10-CM | POA: Diagnosis not present

## 2019-02-09 DIAGNOSIS — E039 Hypothyroidism, unspecified: Secondary | ICD-10-CM | POA: Diagnosis not present

## 2019-02-09 DIAGNOSIS — Z79899 Other long term (current) drug therapy: Secondary | ICD-10-CM | POA: Diagnosis not present

## 2019-02-12 DIAGNOSIS — Z5112 Encounter for antineoplastic immunotherapy: Secondary | ICD-10-CM | POA: Diagnosis not present

## 2019-02-12 DIAGNOSIS — C9000 Multiple myeloma not having achieved remission: Secondary | ICD-10-CM | POA: Diagnosis not present

## 2019-02-12 DIAGNOSIS — C9002 Multiple myeloma in relapse: Secondary | ICD-10-CM | POA: Diagnosis not present

## 2019-02-14 ENCOUNTER — Other Ambulatory Visit: Payer: Self-pay | Admitting: *Deleted

## 2019-02-19 DIAGNOSIS — M859 Disorder of bone density and structure, unspecified: Secondary | ICD-10-CM | POA: Diagnosis not present

## 2019-02-19 DIAGNOSIS — R5383 Other fatigue: Secondary | ICD-10-CM | POA: Diagnosis not present

## 2019-03-04 ENCOUNTER — Other Ambulatory Visit: Payer: Self-pay | Admitting: Psychiatry

## 2019-03-04 DIAGNOSIS — D472 Monoclonal gammopathy: Secondary | ICD-10-CM

## 2019-03-08 DIAGNOSIS — H31001 Unspecified chorioretinal scars, right eye: Secondary | ICD-10-CM | POA: Diagnosis not present

## 2019-03-08 DIAGNOSIS — Z961 Presence of intraocular lens: Secondary | ICD-10-CM | POA: Diagnosis not present

## 2019-03-08 DIAGNOSIS — H02052 Trichiasis without entropian right lower eyelid: Secondary | ICD-10-CM | POA: Diagnosis not present

## 2019-03-12 DIAGNOSIS — Z5112 Encounter for antineoplastic immunotherapy: Secondary | ICD-10-CM | POA: Diagnosis not present

## 2019-03-12 DIAGNOSIS — Z85820 Personal history of malignant melanoma of skin: Secondary | ICD-10-CM | POA: Diagnosis not present

## 2019-03-12 DIAGNOSIS — C9000 Multiple myeloma not having achieved remission: Secondary | ICD-10-CM | POA: Diagnosis not present

## 2019-03-22 ENCOUNTER — Ambulatory Visit (INDEPENDENT_AMBULATORY_CARE_PROVIDER_SITE_OTHER): Payer: Medicare Other | Admitting: Psychiatry

## 2019-03-22 ENCOUNTER — Other Ambulatory Visit: Payer: Self-pay

## 2019-03-22 ENCOUNTER — Encounter: Payer: Self-pay | Admitting: Psychiatry

## 2019-03-22 DIAGNOSIS — F4001 Agoraphobia with panic disorder: Secondary | ICD-10-CM | POA: Diagnosis not present

## 2019-03-22 DIAGNOSIS — G3184 Mild cognitive impairment, so stated: Secondary | ICD-10-CM | POA: Diagnosis not present

## 2019-03-22 DIAGNOSIS — F411 Generalized anxiety disorder: Secondary | ICD-10-CM | POA: Diagnosis not present

## 2019-03-22 DIAGNOSIS — F29 Unspecified psychosis not due to a substance or known physiological condition: Secondary | ICD-10-CM
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
## 2019-03-30 DIAGNOSIS — C9000 Multiple myeloma not having achieved remission: Secondary | ICD-10-CM | POA: Diagnosis not present

## 2019-03-30 DIAGNOSIS — Z889 Allergy status to unspecified drugs, medicaments and biological substances status: Secondary | ICD-10-CM | POA: Diagnosis not present

## 2019-03-30 DIAGNOSIS — H5789 Other specified disorders of eye and adnexa: Secondary | ICD-10-CM | POA: Diagnosis not present

## 2019-03-30 DIAGNOSIS — R0981 Nasal congestion: Secondary | ICD-10-CM | POA: Diagnosis not present

## 2019-04-02 ENCOUNTER — Other Ambulatory Visit: Payer: Self-pay | Admitting: Surgery

## 2019-04-02 DIAGNOSIS — R1901 Right upper quadrant abdominal swelling, mass and lump: Secondary | ICD-10-CM | POA: Diagnosis not present

## 2019-04-04 ENCOUNTER — Other Ambulatory Visit: Payer: Self-pay

## 2019-04-04 ENCOUNTER — Encounter: Payer: Self-pay | Admitting: Diagnostic Neuroimaging

## 2019-04-04 ENCOUNTER — Telehealth: Payer: Self-pay | Admitting: Diagnostic Neuroimaging

## 2019-04-04 ENCOUNTER — Ambulatory Visit (INDEPENDENT_AMBULATORY_CARE_PROVIDER_SITE_OTHER): Payer: Medicare Other | Admitting: Diagnostic Neuroimaging

## 2019-04-04 VITALS — BP 159/74 | HR 73 | Temp 96.8°F | Ht 61.0 in | Wt 119.2 lb

## 2019-04-04 DIAGNOSIS — R413 Other amnesia: Secondary | ICD-10-CM | POA: Diagnosis not present

## 2019-04-04 NOTE — Telephone Encounter (Signed)
LVM advising the patient that Dr Leta Baptist has put in referral as she had requested during her follow up today and expressed to [place]. (I was in room with Dr Leta Baptist during entire follow up appointment. )  Left number for any questions.

## 2019-04-04 NOTE — Telephone Encounter (Signed)
Pt requesting to change her referral for a psychiatrics, requesting a "lady doctor" stating she would feel more comfortable. Please advise once switched.
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
## 2019-04-05 ENCOUNTER — Telehealth: Payer: Self-pay | Admitting: Diagnostic Neuroimaging

## 2019-04-05 DIAGNOSIS — C6990 Malignant neoplasm of unspecified site of unspecified eye: Secondary | ICD-10-CM | POA: Diagnosis not present

## 2019-04-05 DIAGNOSIS — E785 Hyperlipidemia, unspecified: Secondary | ICD-10-CM | POA: Diagnosis not present

## 2019-04-05 DIAGNOSIS — R319 Hematuria, unspecified: Secondary | ICD-10-CM | POA: Diagnosis not present

## 2019-04-05 DIAGNOSIS — C9000 Multiple myeloma not having achieved remission: Secondary | ICD-10-CM | POA: Diagnosis not present

## 2019-04-05 DIAGNOSIS — R160 Hepatomegaly, not elsewhere classified: Secondary | ICD-10-CM | POA: Diagnosis not present

## 2019-04-05 DIAGNOSIS — I639 Cerebral infarction, unspecified: Secondary | ICD-10-CM | POA: Diagnosis not present

## 2019-04-05 DIAGNOSIS — E039 Hypothyroidism, unspecified: Secondary | ICD-10-CM | POA: Diagnosis not present

## 2019-04-05 DIAGNOSIS — C73 Malignant neoplasm of thyroid gland: Secondary | ICD-10-CM | POA: Diagnosis not present

## 2019-04-05 DIAGNOSIS — I129 Hypertensive chronic kidney disease with stage 1 through stage 4 chronic kidney disease, or unspecified chronic kidney disease: Secondary | ICD-10-CM | POA: Diagnosis not present

## 2019-04-05 DIAGNOSIS — I8393 Asymptomatic varicose veins of bilateral lower extremities: Secondary | ICD-10-CM | POA: Diagnosis not present

## 2019-04-05 DIAGNOSIS — D369 Benign neoplasm, unspecified site: Secondary | ICD-10-CM | POA: Diagnosis not present

## 2019-04-05 DIAGNOSIS — N182 Chronic kidney disease, stage 2 (mild): Secondary | ICD-10-CM | POA: Diagnosis not present
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
## 2019-04-05 NOTE — Telephone Encounter (Signed)
Called and left patient a message  to call me back about Referral for Neuropsychologist .

## 2019-04-05 NOTE — Telephone Encounter (Signed)
Dr. Leta Baptist I have tried to reach out to Patient's son's witch are on here DPR . Dr. Beverly Gust Neuropsychologist female is no longer in [place] she has moved away .   Patient was very rude and I did not Like How she was talking about Dr. Leta Baptist

## 2019-04-05 NOTE — Telephone Encounter (Signed)
Re: patient's call into phone staff, Bary Castilla, I accompanied Dr Leta Baptist in the exam room for the entire follow up visit on 04/04/2019. I stayed with the patient after Dr Leta Baptist exited the room and explained her AVS to her. There was never any comment made by Dr Leta Baptist as stated by the patient.

## 2019-04-07 ENCOUNTER — Other Ambulatory Visit: Payer: Self-pay | Admitting: Psychiatry

## 2019-04-07 DIAGNOSIS — D472 Monoclonal gammopathy: Secondary | ICD-10-CM

## 2019-04-09 ENCOUNTER — Other Ambulatory Visit: Payer: Self-pay | Admitting: Psychiatry

## 2019-04-09 DIAGNOSIS — R413 Other amnesia: Secondary | ICD-10-CM | POA: Diagnosis not present

## 2019-04-09 DIAGNOSIS — E039 Hypothyroidism, unspecified: Secondary | ICD-10-CM | POA: Diagnosis not present

## 2019-04-09 DIAGNOSIS — R634 Abnormal weight loss: Secondary | ICD-10-CM | POA: Diagnosis not present

## 2019-04-09 DIAGNOSIS — D472 Monoclonal gammopathy: Secondary | ICD-10-CM

## 2019-04-09 DIAGNOSIS — F411 Generalized anxiety disorder: Secondary | ICD-10-CM | POA: Diagnosis not present

## 2019-04-11 NOTE — Telephone Encounter (Signed)
Patient called stating that she will not be back to this office and that she was not pleased with her visit.

## 2019-04-11 NOTE — Telephone Encounter (Addendum)
Noted, will make Dr Leta Baptist aware.  Patient was released to PCP, return if symptoms worsen or fail to improve.

## 2019-04-12 DIAGNOSIS — Z8041 Family history of malignant neoplasm of ovary: Secondary | ICD-10-CM | POA: Diagnosis not present

## 2019-04-12 DIAGNOSIS — D472 Monoclonal gammopathy: Secondary | ICD-10-CM | POA: Diagnosis not present

## 2019-04-12 DIAGNOSIS — Z79899 Other long term (current) drug therapy: Secondary | ICD-10-CM | POA: Diagnosis not present

## 2019-04-12 DIAGNOSIS — R918 Other nonspecific abnormal finding of lung field: Secondary | ICD-10-CM | POA: Diagnosis not present

## 2019-04-12 DIAGNOSIS — I129 Hypertensive chronic kidney disease with stage 1 through stage 4 chronic kidney disease, or unspecified chronic kidney disease: Secondary | ICD-10-CM | POA: Diagnosis not present

## 2019-04-12 DIAGNOSIS — Z66 Do not resuscitate: Secondary | ICD-10-CM | POA: Diagnosis not present

## 2019-04-12 DIAGNOSIS — M5030 Other cervical disc degeneration, unspecified cervical region: Secondary | ICD-10-CM | POA: Diagnosis not present

## 2019-04-12 DIAGNOSIS — Z885 Allergy status to narcotic agent status: Secondary | ICD-10-CM | POA: Diagnosis not present

## 2019-04-12 DIAGNOSIS — Z800 Family history of malignant neoplasm of digestive organs: Secondary | ICD-10-CM | POA: Diagnosis not present

## 2019-04-12 DIAGNOSIS — F4310 Post-traumatic stress disorder, unspecified: Secondary | ICD-10-CM | POA: Diagnosis not present

## 2019-04-12 DIAGNOSIS — R319 Hematuria, unspecified: Secondary | ICD-10-CM | POA: Diagnosis not present

## 2019-04-12 DIAGNOSIS — F419 Anxiety disorder, unspecified: Secondary | ICD-10-CM | POA: Diagnosis not present

## 2019-04-12 DIAGNOSIS — R413 Other amnesia: Secondary | ICD-10-CM | POA: Diagnosis not present

## 2019-04-12 DIAGNOSIS — Z91040 Latex allergy status: Secondary | ICD-10-CM | POA: Diagnosis not present

## 2019-04-12 DIAGNOSIS — R4182 Altered mental status, unspecified: Secondary | ICD-10-CM | POA: Diagnosis not present

## 2019-04-12 DIAGNOSIS — N182 Chronic kidney disease, stage 2 (mild): Secondary | ICD-10-CM | POA: Diagnosis not present

## 2019-04-12 DIAGNOSIS — Z1389 Encounter for screening for other disorder: Secondary | ICD-10-CM | POA: Diagnosis not present

## 2019-04-12 DIAGNOSIS — Z8579 Personal history of other malignant neoplasms of lymphoid, hematopoietic and related tissues: Secondary | ICD-10-CM | POA: Diagnosis not present

## 2019-04-12 DIAGNOSIS — Z8673 Personal history of transient ischemic attack (TIA), and cerebral infarction without residual deficits: Secondary | ICD-10-CM | POA: Diagnosis not present

## 2019-04-12 DIAGNOSIS — J45909 Unspecified asthma, uncomplicated: Secondary | ICD-10-CM | POA: Diagnosis not present

## 2019-04-12 DIAGNOSIS — F29 Unspecified psychosis not due to a substance or known physiological condition: Secondary | ICD-10-CM | POA: Diagnosis not present

## 2019-04-12 DIAGNOSIS — C9000 Multiple myeloma not having achieved remission: Secondary | ICD-10-CM | POA: Diagnosis not present

## 2019-04-12 DIAGNOSIS — E039 Hypothyroidism, unspecified: Secondary | ICD-10-CM | POA: Diagnosis not present

## 2019-04-12 DIAGNOSIS — Z880 Allergy status to penicillin: Secondary | ICD-10-CM | POA: Diagnosis not present

## 2019-04-12 DIAGNOSIS — F329 Major depressive disorder, single episode, unspecified: Secondary | ICD-10-CM | POA: Diagnosis not present

## 2019-04-12 DIAGNOSIS — Z7982 Long term (current) use of aspirin: Secondary | ICD-10-CM | POA: Diagnosis not present

## 2019-04-12 DIAGNOSIS — R4189 Other symptoms and signs involving cognitive functions and awareness: Secondary | ICD-10-CM | POA: Diagnosis not present

## 2019-04-12 DIAGNOSIS — R51 Headache: Secondary | ICD-10-CM | POA: Diagnosis not present

## 2019-04-12 DIAGNOSIS — C9002 Multiple myeloma in relapse: Secondary | ICD-10-CM | POA: Diagnosis not present

## 2019-04-13 DIAGNOSIS — Z8659 Personal history of other mental and behavioral disorders: Secondary | ICD-10-CM | POA: Diagnosis not present

## 2019-04-13 DIAGNOSIS — R419 Unspecified symptoms and signs involving cognitive functions and awareness: Secondary | ICD-10-CM | POA: Diagnosis not present

## 2019-04-13 DIAGNOSIS — R4586 Emotional lability: Secondary | ICD-10-CM | POA: Diagnosis not present

## 2019-04-13 DIAGNOSIS — I639 Cerebral infarction, unspecified: Secondary | ICD-10-CM | POA: Diagnosis not present

## 2019-04-13 DIAGNOSIS — C9000 Multiple myeloma not having achieved remission: Secondary | ICD-10-CM | POA: Diagnosis not present

## 2019-04-13 DIAGNOSIS — I10 Essential (primary) hypertension: Secondary | ICD-10-CM | POA: Diagnosis not present

## 2019-04-13 DIAGNOSIS — F29 Unspecified psychosis not due to a substance or known physiological condition: Secondary | ICD-10-CM | POA: Diagnosis not present

## 2019-04-13 DIAGNOSIS — R4182 Altered mental status, unspecified: Secondary | ICD-10-CM | POA: Diagnosis not present

## 2019-04-20 DIAGNOSIS — Z1231 Encounter for screening mammogram for malignant neoplasm of breast: Secondary | ICD-10-CM | POA: Diagnosis not present

## 2019-04-25 DIAGNOSIS — H5789 Other specified disorders of eye and adnexa: Secondary | ICD-10-CM | POA: Diagnosis not present

## 2019-04-25 DIAGNOSIS — J3089 Other allergic rhinitis: Secondary | ICD-10-CM | POA: Diagnosis not present

## 2019-05-03 ENCOUNTER — Other Ambulatory Visit: Payer: Self-pay | Admitting: Psychiatry

## 2019-05-03 ENCOUNTER — Telehealth: Payer: Self-pay | Admitting: Psychiatry

## 2019-05-03 DIAGNOSIS — C9000 Multiple myeloma not having achieved remission: Secondary | ICD-10-CM | POA: Diagnosis not present

## 2019-05-03 DIAGNOSIS — F0391 Unspecified dementia with behavioral disturbance: Secondary | ICD-10-CM | POA: Diagnosis not present

## 2019-05-03 DIAGNOSIS — R410 Disorientation, unspecified: Secondary | ICD-10-CM | POA: Diagnosis not present

## 2019-05-03 DIAGNOSIS — E039 Hypothyroidism, unspecified: Secondary | ICD-10-CM | POA: Diagnosis not present

## 2019-05-03 MED ORDER — OLANZAPINE 2.5 MG PO TABS: 2.5000 mg | ORAL_TABLET | Freq: Every day | ORAL | 1 refills | Status: DC

## 2019-05-03 NOTE — Telephone Encounter (Signed)
[name] stopped by the office.  She has been doing chemo at [facility], but the Dr. Parks Ranger prescribes her chemo has never met with her. She has lost 18# and having other issues and would like time with the Dr.  She has told them she would like a new dr that would meet with her and review her status.  She is wondering if you know any of the dr. There that you could recommend to her.  Her next appt with you is 9/15

## 2019-05-03 NOTE — Telephone Encounter (Signed)
TC  Dr. Cari Caraway  PT confused and tangential and delusional while at [facility] and psychiatry was called in for consultation..  Low TSH bc problems with compliance with thyroid meds.  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  They discussed this with the patient and with her son.  The patient has told Dr. Leonides Schanz her primary care physician that she is willing to take the olanzapine if we prescribe it.   It is excellent choice because she has had weight loss and has a great deal of anxiety in addition to the psychosis.  It is difficult to determine whether she is truly med sensitive or just so highly somatic and fearful of medications that she imagines side effects.  We will prescribe a low dosage but I am concerned that 2.5 mg may be not enough to actually have an antipsychotic effect.  However get given her medication sensitivity we will try that dosage.  Lynder Parents, MD, DFAPA

## 2019-05-03 NOTE — Telephone Encounter (Signed)
Unfortunately I do not know any cancer doctors to recommend.

## 2019-05-07 DIAGNOSIS — F22 Delusional disorders: Secondary | ICD-10-CM | POA: Diagnosis not present

## 2019-05-07 DIAGNOSIS — F419 Anxiety disorder, unspecified: Secondary | ICD-10-CM | POA: Diagnosis not present

## 2019-05-07 DIAGNOSIS — C9002 Multiple myeloma in relapse: Secondary | ICD-10-CM | POA: Diagnosis not present

## 2019-05-07 DIAGNOSIS — M79641 Pain in right hand: Secondary | ICD-10-CM | POA: Diagnosis not present

## 2019-05-07 DIAGNOSIS — Z7982 Long term (current) use of aspirin: Secondary | ICD-10-CM | POA: Diagnosis not present

## 2019-05-07 DIAGNOSIS — Z6821 Body mass index (BMI) 21.0-21.9, adult: Secondary | ICD-10-CM | POA: Diagnosis not present

## 2019-05-07 DIAGNOSIS — C6960 Malignant neoplasm of unspecified orbit: Secondary | ICD-10-CM | POA: Diagnosis not present

## 2019-05-07 DIAGNOSIS — Z5112 Encounter for antineoplastic immunotherapy: Secondary | ICD-10-CM | POA: Diagnosis not present

## 2019-05-07 DIAGNOSIS — Z79899 Other long term (current) drug therapy: Secondary | ICD-10-CM | POA: Diagnosis not present

## 2019-05-07 DIAGNOSIS — C6990 Malignant neoplasm of unspecified site of unspecified eye: Secondary | ICD-10-CM | POA: Diagnosis not present

## 2019-05-07 DIAGNOSIS — R634 Abnormal weight loss: Secondary | ICD-10-CM | POA: Diagnosis not present

## 2019-05-07 DIAGNOSIS — Z880 Allergy status to penicillin: Secondary | ICD-10-CM | POA: Diagnosis not present

## 2019-05-07 DIAGNOSIS — R51 Headache: Secondary | ICD-10-CM | POA: Diagnosis not present

## 2019-05-07 DIAGNOSIS — N3090 Cystitis, unspecified without hematuria: Secondary | ICD-10-CM | POA: Diagnosis not present

## 2019-05-07 DIAGNOSIS — C9000 Multiple myeloma not having achieved remission: Secondary | ICD-10-CM | POA: Diagnosis not present

## 2019-05-08 DIAGNOSIS — N760 Acute vaginitis: Secondary | ICD-10-CM | POA: Diagnosis not present

## 2019-05-08 DIAGNOSIS — R102 Pelvic and perineal pain: Secondary | ICD-10-CM | POA: Diagnosis not present

## 2019-05-08 DIAGNOSIS — R319 Hematuria, unspecified: Secondary | ICD-10-CM | POA: Diagnosis not present

## 2019-05-14 DIAGNOSIS — R102 Pelvic and perineal pain: Secondary | ICD-10-CM | POA: Diagnosis not present

## 2019-05-14 DIAGNOSIS — N952 Postmenopausal atrophic vaginitis: Secondary | ICD-10-CM | POA: Diagnosis not present

## 2019-05-28 DIAGNOSIS — N952 Postmenopausal atrophic vaginitis: Secondary | ICD-10-CM | POA: Diagnosis not present

## 2019-05-28 DIAGNOSIS — N760 Acute vaginitis: Secondary | ICD-10-CM | POA: Diagnosis not present

## 2019-05-31 DIAGNOSIS — R8271 Bacteriuria: Secondary | ICD-10-CM | POA: Diagnosis not present

## 2019-06-04 DIAGNOSIS — Z5112 Encounter for antineoplastic immunotherapy: Secondary | ICD-10-CM | POA: Diagnosis not present

## 2019-06-04 DIAGNOSIS — Z85820 Personal history of malignant melanoma of skin: Secondary | ICD-10-CM | POA: Diagnosis not present

## 2019-06-04 DIAGNOSIS — C9000 Multiple myeloma not having achieved remission: Secondary | ICD-10-CM | POA: Diagnosis not present

## 2019-06-05 DIAGNOSIS — N761 Subacute and chronic vaginitis: Secondary | ICD-10-CM | POA: Diagnosis not present

## 2019-06-12 DIAGNOSIS — C9002 Multiple myeloma in relapse: Secondary | ICD-10-CM | POA: Diagnosis not present

## 2019-06-12 DIAGNOSIS — R419 Unspecified symptoms and signs involving cognitive functions and awareness: Secondary | ICD-10-CM | POA: Diagnosis not present

## 2019-06-19 ENCOUNTER — Encounter: Payer: Self-pay | Admitting: Psychiatry

## 2019-06-19 ENCOUNTER — Encounter: Payer: Medicare Other | Admitting: Psychiatry

## 2019-06-19 ENCOUNTER — Other Ambulatory Visit: Payer: Self-pay

## 2019-06-19 NOTE — Progress Notes (Signed)
Erroneous encounter.  Unable to reach patient by phone. This encounter was created in error - please disregard.

## 2019-06-20 DIAGNOSIS — L301 Dyshidrosis [pompholyx]: Secondary | ICD-10-CM | POA: Diagnosis not present

## 2019-06-21 DIAGNOSIS — L259 Unspecified contact dermatitis, unspecified cause: Secondary | ICD-10-CM | POA: Diagnosis not present

## 2019-06-26 ENCOUNTER — Encounter: Payer: Self-pay | Admitting: Psychiatry

## 2019-06-26 ENCOUNTER — Ambulatory Visit (INDEPENDENT_AMBULATORY_CARE_PROVIDER_SITE_OTHER): Payer: Medicare Other | Admitting: Psychiatry

## 2019-06-26 ENCOUNTER — Other Ambulatory Visit: Payer: Self-pay

## 2019-06-26 DIAGNOSIS — G3184 Mild cognitive impairment, so stated: Secondary | ICD-10-CM

## 2019-06-26 DIAGNOSIS — F5105 Insomnia due to other mental disorder: Secondary | ICD-10-CM

## 2019-06-26 DIAGNOSIS — F4001 Agoraphobia with panic disorder: Secondary | ICD-10-CM

## 2019-06-26 DIAGNOSIS — F29 Unspecified psychosis not due to a substance or known physiological condition: Secondary | ICD-10-CM

## 2019-06-26 DIAGNOSIS — F411 Generalized anxiety disorder: Secondary | ICD-10-CM | POA: Diagnosis not present
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
## 2019-07-02 DIAGNOSIS — C9002 Multiple myeloma in relapse: Secondary | ICD-10-CM | POA: Diagnosis not present

## 2019-07-02 DIAGNOSIS — Z66 Do not resuscitate: Secondary | ICD-10-CM | POA: Diagnosis not present

## 2019-07-02 DIAGNOSIS — Z85820 Personal history of malignant melanoma of skin: Secondary | ICD-10-CM | POA: Diagnosis not present

## 2019-07-02 DIAGNOSIS — C9000 Multiple myeloma not having achieved remission: Secondary | ICD-10-CM | POA: Diagnosis not present

## 2019-07-05 DIAGNOSIS — Z1159 Encounter for screening for other viral diseases: Secondary | ICD-10-CM | POA: Diagnosis not present

## 2019-07-05 DIAGNOSIS — R3129 Other microscopic hematuria: Secondary | ICD-10-CM | POA: Diagnosis not present

## 2019-07-05 DIAGNOSIS — L0390 Cellulitis, unspecified: Secondary | ICD-10-CM | POA: Diagnosis not present

## 2019-07-09 DIAGNOSIS — R3129 Other microscopic hematuria: Secondary | ICD-10-CM | POA: Diagnosis not present

## 2019-07-09 DIAGNOSIS — L0390 Cellulitis, unspecified: Secondary | ICD-10-CM | POA: Diagnosis not present

## 2019-07-10 DIAGNOSIS — F22 Delusional disorders: Secondary | ICD-10-CM | POA: Diagnosis not present

## 2019-07-10 DIAGNOSIS — C9000 Multiple myeloma not having achieved remission: Secondary | ICD-10-CM | POA: Diagnosis not present

## 2019-07-30 DIAGNOSIS — Z66 Do not resuscitate: Secondary | ICD-10-CM | POA: Diagnosis not present

## 2019-07-30 DIAGNOSIS — C9000 Multiple myeloma not having achieved remission: Secondary | ICD-10-CM | POA: Diagnosis not present

## 2019-07-30 DIAGNOSIS — Z5112 Encounter for antineoplastic immunotherapy: Secondary | ICD-10-CM | POA: Diagnosis not present

## 2019-07-30 DIAGNOSIS — C6990 Malignant neoplasm of unspecified site of unspecified eye: Secondary | ICD-10-CM | POA: Diagnosis not present

## 2019-07-30 DIAGNOSIS — C9002 Multiple myeloma in relapse: Secondary | ICD-10-CM | POA: Diagnosis not present

## 2019-07-30 DIAGNOSIS — Z6821 Body mass index (BMI) 21.0-21.9, adult: Secondary | ICD-10-CM | POA: Diagnosis not present

## 2019-08-08 DIAGNOSIS — R1901 Right upper quadrant abdominal swelling, mass and lump: Secondary | ICD-10-CM | POA: Diagnosis not present

## 2019-08-16 DIAGNOSIS — R1901 Right upper quadrant abdominal swelling, mass and lump: Secondary | ICD-10-CM | POA: Diagnosis not present
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
## 2019-08-17 ENCOUNTER — Ambulatory Visit: Payer: Medicare Other | Admitting: Internal Medicine

## 2019-09-03 ENCOUNTER — Other Ambulatory Visit: Payer: Self-pay | Admitting: Surgery

## 2019-09-03 DIAGNOSIS — L089 Local infection of the skin and subcutaneous tissue, unspecified: Secondary | ICD-10-CM | POA: Diagnosis not present

## 2019-09-03 DIAGNOSIS — N611 Abscess of the breast and nipple: Secondary | ICD-10-CM | POA: Diagnosis not present

## 2019-09-03 DIAGNOSIS — Z79899 Other long term (current) drug therapy: Secondary | ICD-10-CM | POA: Diagnosis not present

## 2019-09-03 DIAGNOSIS — Z7982 Long term (current) use of aspirin: Secondary | ICD-10-CM | POA: Diagnosis not present

## 2019-09-03 DIAGNOSIS — L723 Sebaceous cyst: Secondary | ICD-10-CM | POA: Diagnosis not present

## 2019-09-03 DIAGNOSIS — C9000 Multiple myeloma not having achieved remission: Secondary | ICD-10-CM | POA: Diagnosis not present

## 2019-09-17 ENCOUNTER — Other Ambulatory Visit: Payer: Self-pay

## 2019-09-17 ENCOUNTER — Encounter (HOSPITAL_BASED_OUTPATIENT_CLINIC_OR_DEPARTMENT_OTHER): Payer: Self-pay | Admitting: Surgery

## 2019-09-21 ENCOUNTER — Other Ambulatory Visit (HOSPITAL_COMMUNITY)
Admission: RE | Admit: 2019-09-21 | Discharge: 2019-09-21 | Disposition: A | Discharge status: Home / Self Care | Payer: Medicare Other | Source: Ambulatory Visit | Attending: Surgery | Admitting: Surgery

## 2019-09-21 DIAGNOSIS — Z01812 Encounter for preprocedural laboratory examination: Secondary | ICD-10-CM | POA: Insufficient documentation

## 2019-09-21 DIAGNOSIS — Z20828 Contact with and (suspected) exposure to other viral communicable diseases: Secondary | ICD-10-CM | POA: Insufficient documentation

## 2019-09-22 LAB — NOVEL CORONAVIRUS, NAA (HOSP ORDER, SEND-OUT TO REF LAB; TAT 18-24 HRS): SARS-CoV-2, NAA: NOT DETECTED

## 2019-09-24 NOTE — Progress Notes (Signed)
Chart reviewed by Dr. Royce Macadamia and will proceed with surgery as scheduled, no additional testing needed.

## 2019-09-24 NOTE — H&P (Signed)
   [name] Documented: 09/03/2019 2:54 PM Location: [facility] Patient #: [id number] DOB: 1941-05-12 Divorced / Language: [name] / Race: White Female   History of Present Illness ([name] A. Ninfa Linden MD; 09/03/2019 3:18 PM) The patient is a 78 year old female who presents with skin lesions. She is here today to again discuss her chronically infected sebaceous cyst on her right lower chest. She again canceled Surgery in the past. It is been intermittently draining and she has been on antibiotics. An attempt to drain it in our office by PA was unsuccessful secondary to her discomfort. She is now eager to proceed with surgery. She has not had chemotherapy in over one month.   Allergies [name], CMA; 09/03/2019 2:54 PM) Doxycycline Hyclate *Tetracyclines**  Lipitor *ANTIHYPERLIPIDEMICS*  Aldactone *DIURETICS*  Hydrocodone-Acetaminophen *ANALGESICS - OPIOID*  Levofloxacin *Fluoroquinolones**  Losartan Potassium *ANTIHYPERTENSIVES*  PredniSONE (Pak) *CORTICOSTEROIDS*  Pseudoephedrine HCl *NASAL AGENTS - SYSTEMIC AND TOPICAL*  Sertraline HCl *ANTIDEPRESSANTS*  TraZODone HCl *ANTIDEPRESSANTS*  Ultram *ANALGESICS - OPIOID*  Adhesive Bandages *MEDICAL DEVICES AND SUPPLIES*   Medication History [name], CMA; 09/03/2019 2:54 PM) ALPRAZolam (0.5MG  Tablet, Oral) Active. Estradiol (0.1MG /GM Cream, Vaginal) Active. amLODIPine Besylate (10MG  Tablet, Oral) Active. Levothyroxine Sodium (175MCG Tablet, Oral) Active. ClonazePAM (1MG  Tablet, Oral as needed) Active. Fluticasone Propionate (50MCG/ACT Suspension, Nasal as needed) Active. Losartan Potassium (50MG  Tablet, Oral daily) Active. Simvastatin (20MG  Tablet, Oral daily) Active. Telmisartan (40MG  Tablet, Oral daily) Active. Medications Reconciled  Vitals ([name] CMA; 09/03/2019 2:54 PM) 09/03/2019 2:54 PM Weight: 118 lb Height: 61in Body Surface Area: 1.51 m Body Mass  Index: 22.3 kg/m  BP: 118/76 (Sitting, Left Arm, Standard)       Physical Exam ([name] A. Ninfa Linden MD; 09/03/2019 3:18 PM) The physical exam findings are as follows: Note:On exam, she again has the 3 cm right lower chest wall sebaceous cyst just below the breast. There is some active drainage and mild cellulitis. She again refused incision and drainage in the office Lungs clear CV RRR Generally well in appearance   Assessment & Plan ([name] A. Ninfa Linden MD; 09/03/2019 3:19 PM) INFECTED SEBACEOUS CYST (L72.3) Impression: Given the size, this will require complete surgical excision in the operating room to completely remove this area as well as for histologic evaluation given her previous history of malignancy. I discussed this with her in detail including the risks. We will continue antibiotics. She is again eager to proceed with surgery. Current Plans Restarted Bactrim DS 800-160 MG Oral Tablet, 1 (one) Tablet two times daily, #14, 7 days starting 09/03/2019, No Refill.

## 2019-09-25 ENCOUNTER — Ambulatory Visit (HOSPITAL_BASED_OUTPATIENT_CLINIC_OR_DEPARTMENT_OTHER)
Admission: RE | Admit: 2019-09-25 | Discharge: 2019-09-25 | Disposition: A | Discharge status: Home / Self Care | Payer: Medicare Other | Attending: Surgery | Admitting: Surgery

## 2019-09-25 ENCOUNTER — Encounter (HOSPITAL_BASED_OUTPATIENT_CLINIC_OR_DEPARTMENT_OTHER): Payer: Self-pay | Admitting: Surgery

## 2019-09-25 ENCOUNTER — Ambulatory Visit (HOSPITAL_BASED_OUTPATIENT_CLINIC_OR_DEPARTMENT_OTHER): Payer: Medicare Other | Admitting: Certified Registered"

## 2019-09-25 ENCOUNTER — Encounter (HOSPITAL_BASED_OUTPATIENT_CLINIC_OR_DEPARTMENT_OTHER)
Admission: RE | Disposition: A | Discharge status: Home / Self Care | Payer: Self-pay | Source: Home / Self Care | Attending: Surgery

## 2019-09-25 DIAGNOSIS — Z79890 Hormone replacement therapy (postmenopausal): Secondary | ICD-10-CM | POA: Diagnosis not present

## 2019-09-25 DIAGNOSIS — Z79899 Other long term (current) drug therapy: Secondary | ICD-10-CM | POA: Diagnosis not present

## 2019-09-25 DIAGNOSIS — I10 Essential (primary) hypertension: Secondary | ICD-10-CM | POA: Diagnosis not present

## 2019-09-25 DIAGNOSIS — F0390 Unspecified dementia without behavioral disturbance: Secondary | ICD-10-CM | POA: Insufficient documentation

## 2019-09-25 DIAGNOSIS — L723 Sebaceous cyst: Secondary | ICD-10-CM | POA: Insufficient documentation

## 2019-09-25 DIAGNOSIS — F419 Anxiety disorder, unspecified: Secondary | ICD-10-CM | POA: Insufficient documentation

## 2019-09-25 HISTORY — PX: CYST EXCISION: SHX5701

## 2019-09-25 SURGERY — CYST REMOVAL
Anesthesia: Monitor Anesthesia Care | Site: Abdomen

## 2019-09-25 SURGERY — EXCISION MASS
Anesthesia: Monitor Anesthesia Care | Laterality: Right

## 2019-09-25 MED ORDER — VANCOMYCIN HCL IN DEXTROSE 1-5 GM/200ML-% IV SOLN
1000.0000 mg | INTRAVENOUS | Status: AC
  Administered 2019-09-25: 1000 mg via INTRAVENOUS

## 2019-09-25 MED ORDER — 0.9 % SODIUM CHLORIDE (POUR BTL) OPTIME
TOPICAL | Status: DC | PRN
  Administered 2019-09-25: 15:00:00 1000 mL

## 2019-09-25 MED ORDER — CHLORHEXIDINE GLUCONATE CLOTH 2 % EX PADS: 6.0000 | MEDICATED_PAD | Freq: Once | CUTANEOUS | Status: DC

## 2019-09-25 MED ORDER — TRAMADOL HCL 50 MG PO TABS: 50.0000 mg | ORAL_TABLET | Freq: Four times a day (QID) | ORAL | 0 refills | Status: AC | PRN

## 2019-09-25 MED ORDER — FENTANYL CITRATE (PF) 100 MCG/2ML IJ SOLN
25.0000 ug | INTRAMUSCULAR | Status: DC | PRN
  Administered 2019-09-25: 25 ug via INTRAVENOUS

## 2019-09-25 MED ORDER — PROPOFOL 10 MG/ML IV BOLUS
INTRAVENOUS | Status: AC
  Filled 2019-09-25: qty 20

## 2019-09-25 MED ORDER — PROPOFOL 10 MG/ML IV BOLUS
INTRAVENOUS | Status: DC | PRN
  Administered 2019-09-25: 20 ug via INTRAVENOUS
  Administered 2019-09-25 (×2): 10 ug via INTRAVENOUS
  Administered 2019-09-25: 20 ug via INTRAVENOUS
  Administered 2019-09-25 (×2): 10 ug via INTRAVENOUS

## 2019-09-25 MED ORDER — MIDAZOLAM HCL 2 MG/2ML IJ SOLN: 1.0000 mg | INTRAMUSCULAR | Status: DC | PRN

## 2019-09-25 MED ORDER — FENTANYL CITRATE (PF) 100 MCG/2ML IJ SOLN
INTRAMUSCULAR | Status: AC
  Filled 2019-09-25: qty 2

## 2019-09-25 MED ORDER — ONDANSETRON HCL 4 MG/2ML IJ SOLN: 4.0000 mg | Freq: Once | INTRAMUSCULAR | Status: DC | PRN

## 2019-09-25 MED ORDER — TRAMADOL HCL 50 MG PO TABS
50.0000 mg | ORAL_TABLET | Freq: Once | ORAL | Status: AC
  Administered 2019-09-25: 50 mg via ORAL

## 2019-09-25 MED ORDER — TRAMADOL HCL 50 MG PO TABS
ORAL_TABLET | ORAL | Status: AC
  Filled 2019-09-25: qty 1

## 2019-09-25 MED ORDER — FENTANYL CITRATE (PF) 100 MCG/2ML IJ SOLN
50.0000 ug | INTRAMUSCULAR | Status: DC | PRN
  Administered 2019-09-25: 15:00:00 25 ug via INTRAVENOUS

## 2019-09-25 MED ORDER — VANCOMYCIN HCL IN DEXTROSE 1-5 GM/200ML-% IV SOLN
INTRAVENOUS | Status: AC
  Filled 2019-09-25: qty 200

## 2019-09-25 MED ORDER — LIDOCAINE HCL (PF) 1 % IJ SOLN
INTRAMUSCULAR | Status: DC | PRN
  Administered 2019-09-25: 20 mL

## 2019-09-25 MED ORDER — MEPERIDINE HCL 25 MG/ML IJ SOLN: 6.2500 mg | INTRAMUSCULAR | Status: DC | PRN

## 2019-09-25 MED ORDER — LIDOCAINE HCL (PF) 1 % IJ SOLN
INTRAMUSCULAR | Status: AC
  Filled 2019-09-25: qty 30

## 2019-09-25 MED ORDER — PROPOFOL 500 MG/50ML IV EMUL
INTRAVENOUS | Status: DC | PRN
  Administered 2019-09-25: 100 ug/kg/min via INTRAVENOUS

## 2019-09-25 MED ORDER — LACTATED RINGERS IV SOLN: INTRAVENOUS | Status: DC

## 2019-09-25 SURGICAL SUPPLY — 30 items
ADH SKN CLS APL DERMABOND .7 (GAUZE/BANDAGES/DRESSINGS) ×1
APL PRP STRL LF DISP 70% ISPRP (MISCELLANEOUS) ×1
BLADE HEX COATED 2.75 (ELECTRODE) ×2 IMPLANT
BLADE SURG 15 STRL LF DISP TIS (BLADE) ×1 IMPLANT
BLADE SURG 15 STRL SS (BLADE) ×2
CHLORAPREP W/TINT 26 (MISCELLANEOUS) ×2 IMPLANT
COVER BACK TABLE REUSABLE LG (DRAPES) ×2 IMPLANT
COVER MAYO STAND REUSABLE (DRAPES) ×2 IMPLANT
DERMABOND ADVANCED (GAUZE/BANDAGES/DRESSINGS) ×1
DERMABOND ADVANCED .7 DNX12 (GAUZE/BANDAGES/DRESSINGS) ×2 IMPLANT
DRAPE LAPAROTOMY 100X72 PEDS (DRAPES) ×2 IMPLANT
DRAPE UTILITY XL STRL (DRAPES) ×2 IMPLANT
ELECT REM PT RETURN 9FT ADLT (ELECTROSURGICAL) ×2
ELECTRODE REM PT RTRN 9FT ADLT (ELECTROSURGICAL) ×1 IMPLANT
GLOVE SURG SIGNA 7.5 PF LTX (GLOVE) ×2 IMPLANT
GOWN STRL REUS W/ TWL LRG LVL3 (GOWN DISPOSABLE) ×1 IMPLANT
GOWN STRL REUS W/ TWL XL LVL3 (GOWN DISPOSABLE) ×1 IMPLANT
GOWN STRL REUS W/TWL LRG LVL3 (GOWN DISPOSABLE) ×2
GOWN STRL REUS W/TWL XL LVL3 (GOWN DISPOSABLE) ×2
NDL HYPO 25X1 1.5 SAFETY (NEEDLE) ×1 IMPLANT
NEEDLE HYPO 25X1 1.5 SAFETY (NEEDLE) ×2 IMPLANT
NS IRRIG 1000ML POUR BTL (IV SOLUTION) ×1 IMPLANT
PACK BASIN DAY SURGERY FS (CUSTOM PROCEDURE TRAY) ×2 IMPLANT
PENCIL SMOKE EVACUATOR (MISCELLANEOUS) ×2 IMPLANT
SPONGE LAP 4X18 RFD (DISPOSABLE) ×2 IMPLANT
SUT MNCRL AB 4-0 PS2 18 (SUTURE) ×2 IMPLANT
SUT VIC AB 3-0 SH 27 (SUTURE) ×2
SUT VIC AB 3-0 SH 27X BRD (SUTURE) ×1 IMPLANT
SYR CONTROL 10ML LL (SYRINGE) ×2 IMPLANT
TOWEL GREEN STERILE FF (TOWEL DISPOSABLE) ×2 IMPLANT

## 2019-09-25 NOTE — Discharge Instructions (Signed)
It is okay to shower starting tomorrow  Ice pack, Tylenol, and ibuprofen also for pain  No vigorous activity for 1 week   Post Anesthesia Home Care Instructions  Activity: Get plenty of rest for the remainder of the day. A responsible individual must stay with you for 24 hours following the procedure.  For the next 24 hours, DO NOT: -Drive a car -Paediatric nurse -Drink alcoholic beverages -Take any medication unless instructed by your physician -Make any legal decisions or sign important papers.  Meals: Start with liquid foods such as gelatin or soup. Progress to regular foods as tolerated. Avoid greasy, spicy, heavy foods. If nausea and/or vomiting occur, drink only clear liquids until the nausea and/or vomiting subsides. Call your physician if vomiting continues.  Special Instructions/Symptoms: Your throat may feel dry or sore from the anesthesia or the breathing tube placed in your throat during surgery. If this causes discomfort, gargle with warm salt water. The discomfort should disappear within 24 hours.  If you had a scopolamine patch placed behind your ear for the management of post- operative nausea and/or vomiting:  1. The medication in the patch is effective for 72 hours, after which it should be removed.  Wrap patch in a tissue and discard in the trash. Wash hands thoroughly with soap and water. 2. You may remove the patch earlier than 72 hours if you experience unpleasant side effects which may include dry mouth, dizziness or visual disturbances. 3. Avoid touching the patch. Wash your hands with soap and water after contact with the patch.

## 2019-09-25 NOTE — Progress Notes (Signed)
RN spoke to [place], MD about elevated blood pressure in PACU, patient was also hypertensive in PreOp, MD OK'd patient for Discharge with elevated blood pressure. Patient is A&O, stable, & ready to get dressed & go home.

## 2019-09-25 NOTE — Anesthesia Postprocedure Evaluation (Signed)
Anesthesia Post Note  Patient: [name]  Procedure(s) Performed: EXCISION OF SEBACEOUS CYST RIGHT LOWER CHEST (N/A Abdomen)     Patient location during evaluation: PACU Anesthesia Type: MAC Level of consciousness: awake and alert Pain management: pain level controlled Vital Signs Assessment: post-procedure vital signs reviewed and stable Respiratory status: spontaneous breathing, nonlabored ventilation, respiratory function stable and patient connected to nasal cannula oxygen Cardiovascular status: stable and blood pressure returned to baseline Postop Assessment: no apparent nausea or vomiting Anesthetic complications: no    Last Vitals:  Vitals:   09/25/19 1551 09/25/19 1553  BP: (!) 181/71 (!) 178/90  Pulse: 65   Resp: 18   Temp:    SpO2: 100%     Last Pain:  Vitals:   09/25/19 1553  TempSrc:   PainSc: 4                  Isacc Turney DAVID

## 2019-09-25 NOTE — Interval H&P Note (Signed)
History and Physical Interval Note: no change in H and p  09/25/2019 1:59 PM  [name]  has presented today for surgery, with the diagnosis of CHRONICALLY INFECTED SEBACEOUS CYST.  The various methods of treatment have been discussed with the patient and family. After consideration of risks, benefits and other options for treatment, the patient has consented to  Procedure(s): EXCISION OF SEBACEOUS CYST RIGHT LOWER CHEST (N/A) as a surgical intervention.  The patient's history has been reviewed, patient examined, no change in status, stable for surgery.  I have reviewed the patient's chart and labs.  Questions were answered to the patient's satisfaction.     Coralie Keens

## 2019-09-25 NOTE — Op Note (Signed)
EXCISION OF SEBACEOUS CYST RIGHT LOWER CHEST  Procedure Note  [name] 09/25/2019   Pre-op Diagnosis: CHRONICALLY INFECTED SEBACEOUS CYST     Post-op Diagnosis: same  Procedure(s):  EXCISION OF SEBACEOUS CYST RIGHT LOWER CHEST ( 3 cm )  Surgeon(s): Coralie Keens, MD  Anesthesia: Monitor Anesthesia Care  Staff:  Circulator: Rozell Searing, RN Relief Circulator: Faythe Dingwall, RN Scrub Person: Rolan Bucco  Estimated Blood Loss: Minimal               Specimens: sent to path  Procedure: The patient was brought to the operating room and identifies correct patient.  She is placed upon the operating table and anesthesia was induced.  Her right lower chest was prepped and draped in usual sterile fashion.  I anesthetized skin around the skin lesion with Marcaine.  I then performed an elliptical incision with a scalpel.  I take this down to the subtenons tissue electrocautery and excised the lesion in its entirety.  It was approximately 3 cm in size.  I anesthetized incision further with Marcaine.  I then closed the subcutaneous tissue with interrupted 3-0 Vicryl sutures and closed skin with a running 4-0 Monocryl.  Dermabond was then applied.  The patient tolerated the procedure well.  All the counts were correct at the end of the procedure.  The patient was then again in a stable condition to the recovery room.          Coralie Keens   Date: 09/25/2019  Time: 2:56 PM

## 2019-09-25 NOTE — Anesthesia Preprocedure Evaluation (Signed)
Anesthesia Evaluation  Patient identified by MRN, date of birth, ID band Patient awake    Reviewed: Allergy & Precautions, NPO status , Patient's Chart, lab work & pertinent test results  Airway Mallampati: I  TM Distance: >3 FB Neck ROM: Full    Dental   Pulmonary    Pulmonary exam normal        Cardiovascular hypertension, Pt. on medications Normal cardiovascular exam     Neuro/Psych Anxiety Dementia    GI/Hepatic GERD  ,  Endo/Other    Renal/GU      Musculoskeletal   Abdominal   Peds  Hematology   Anesthesia Other Findings   Reproductive/Obstetrics                             Anesthesia Physical Anesthesia Plan  ASA: III  Anesthesia Plan: MAC   Post-op Pain Management:    Induction: Intravenous  PONV Risk Score and Plan: 2 and Ondansetron and Treatment may vary due to age or medical condition  Airway Management Planned: Nasal Cannula  Additional Equipment:   Intra-op Plan:   Post-operative Plan:   Informed Consent: I have reviewed the patients History and Physical, chart, labs and discussed the procedure including the risks, benefits and alternatives for the proposed anesthesia with the patient or authorized representative who has indicated his/her understanding and acceptance.       Plan Discussed with: CRNA and Surgeon  Anesthesia Plan Comments:         Anesthesia Quick Evaluation

## 2019-09-25 NOTE — Addendum Note (Signed)
Addendum  created 09/25/19 1628 by Lillia Abed, MD   Order list changed

## 2019-09-25 NOTE — Transfer of Care (Signed)
Immediate Anesthesia Transfer of Care Note  Patient: [name]  Procedure(s) Performed: EXCISION OF SEBACEOUS CYST RIGHT LOWER CHEST (N/A Abdomen)  Patient Location: PACU  Anesthesia Type:MAC  Level of Consciousness: awake, alert  and oriented  Airway & Oxygen Therapy: Patient Spontanous Breathing and Patient connected to face mask oxygen  Post-op Assessment: Report given to RN and Post -op Vital signs reviewed and stable  Post vital signs: Reviewed and stable  Last Vitals:  Vitals Value Taken Time  BP 151/81 09/25/19 1500  Temp    Pulse 75 09/25/19 1502  Resp 15 09/25/19 1502  SpO2 100 % 09/25/19 1502  Vitals shown include unvalidated device data.  Last Pain:  Vitals:   09/25/19 1410  TempSrc: Temporal  PainSc: 0-No pain         Complications: No apparent anesthesia complications

## 2019-09-26 ENCOUNTER — Encounter: Payer: Self-pay | Admitting: *Deleted

## 2019-09-26 LAB — SURGICAL PATHOLOGY

## 2019-10-12 ENCOUNTER — Other Ambulatory Visit: Payer: Self-pay | Admitting: *Deleted

## 2019-10-12 MED ORDER — SIMVASTATIN 20 MG PO TABS: 20.0000 mg | ORAL_TABLET | Freq: Every evening | ORAL | 3 refills | Status: DC

## 2019-10-15 DIAGNOSIS — C9000 Multiple myeloma not having achieved remission: Secondary | ICD-10-CM | POA: Diagnosis not present

## 2019-10-15 DIAGNOSIS — F22 Delusional disorders: Secondary | ICD-10-CM | POA: Diagnosis not present

## 2019-10-16 DIAGNOSIS — Z0000 Encounter for general adult medical examination without abnormal findings: Secondary | ICD-10-CM | POA: Diagnosis not present

## 2019-10-16 DIAGNOSIS — I10 Essential (primary) hypertension: Secondary | ICD-10-CM | POA: Diagnosis not present

## 2019-10-16 DIAGNOSIS — Z20828 Contact with and (suspected) exposure to other viral communicable diseases: Secondary | ICD-10-CM | POA: Diagnosis not present

## 2019-10-16 DIAGNOSIS — E559 Vitamin D deficiency, unspecified: Secondary | ICD-10-CM | POA: Diagnosis not present

## 2019-10-16 DIAGNOSIS — R5383 Other fatigue: Secondary | ICD-10-CM | POA: Diagnosis not present

## 2019-10-16 DIAGNOSIS — R0602 Shortness of breath: Secondary | ICD-10-CM | POA: Diagnosis not present

## 2019-10-22 DIAGNOSIS — S0511XA Contusion of eyeball and orbital tissues, right eye, initial encounter: Secondary | ICD-10-CM | POA: Diagnosis not present

## 2019-10-25 DIAGNOSIS — E559 Vitamin D deficiency, unspecified: Secondary | ICD-10-CM | POA: Diagnosis not present

## 2019-10-25 DIAGNOSIS — E039 Hypothyroidism, unspecified: Secondary | ICD-10-CM | POA: Diagnosis not present

## 2019-10-25 DIAGNOSIS — R5383 Other fatigue: Secondary | ICD-10-CM | POA: Diagnosis not present

## 2019-10-25 DIAGNOSIS — I10 Essential (primary) hypertension: Secondary | ICD-10-CM | POA: Diagnosis not present

## 2019-11-01 DIAGNOSIS — Z85850 Personal history of malignant neoplasm of thyroid: Secondary | ICD-10-CM | POA: Diagnosis not present

## 2019-11-01 DIAGNOSIS — I10 Essential (primary) hypertension: Secondary | ICD-10-CM | POA: Diagnosis not present

## 2019-11-01 DIAGNOSIS — J309 Allergic rhinitis, unspecified: Secondary | ICD-10-CM | POA: Diagnosis not present

## 2019-11-01 DIAGNOSIS — C9002 Multiple myeloma in relapse: Secondary | ICD-10-CM | POA: Diagnosis not present

## 2019-11-01 DIAGNOSIS — E782 Mixed hyperlipidemia: Secondary | ICD-10-CM | POA: Diagnosis not present

## 2019-11-01 DIAGNOSIS — M545 Low back pain: Secondary | ICD-10-CM | POA: Diagnosis not present

## 2019-11-01 DIAGNOSIS — J189 Pneumonia, unspecified organism: Secondary | ICD-10-CM | POA: Diagnosis not present

## 2019-11-01 DIAGNOSIS — R413 Other amnesia: Secondary | ICD-10-CM | POA: Diagnosis not present

## 2019-11-01 DIAGNOSIS — R319 Hematuria, unspecified: Secondary | ICD-10-CM | POA: Diagnosis not present

## 2019-11-01 DIAGNOSIS — Z0000 Encounter for general adult medical examination without abnormal findings: Secondary | ICD-10-CM | POA: Diagnosis not present

## 2019-11-01 DIAGNOSIS — F411 Generalized anxiety disorder: Secondary | ICD-10-CM | POA: Diagnosis not present

## 2019-11-01 DIAGNOSIS — E039 Hypothyroidism, unspecified: Secondary | ICD-10-CM | POA: Diagnosis not present

## 2019-11-07 DIAGNOSIS — I8393 Asymptomatic varicose veins of bilateral lower extremities: Secondary | ICD-10-CM | POA: Diagnosis not present

## 2019-11-07 DIAGNOSIS — I639 Cerebral infarction, unspecified: Secondary | ICD-10-CM | POA: Diagnosis not present

## 2019-11-07 DIAGNOSIS — E785 Hyperlipidemia, unspecified: Secondary | ICD-10-CM | POA: Diagnosis not present

## 2019-11-07 DIAGNOSIS — F439 Reaction to severe stress, unspecified: Secondary | ICD-10-CM | POA: Diagnosis not present

## 2019-11-07 DIAGNOSIS — N182 Chronic kidney disease, stage 2 (mild): Secondary | ICD-10-CM | POA: Diagnosis not present

## 2019-11-07 DIAGNOSIS — E039 Hypothyroidism, unspecified: Secondary | ICD-10-CM | POA: Diagnosis not present

## 2019-11-07 DIAGNOSIS — I129 Hypertensive chronic kidney disease with stage 1 through stage 4 chronic kidney disease, or unspecified chronic kidney disease: Secondary | ICD-10-CM | POA: Diagnosis not present

## 2019-11-07 DIAGNOSIS — R319 Hematuria, unspecified: Secondary | ICD-10-CM | POA: Diagnosis not present

## 2019-11-07 DIAGNOSIS — C73 Malignant neoplasm of thyroid gland: Secondary | ICD-10-CM | POA: Diagnosis not present

## 2019-11-10 ENCOUNTER — Ambulatory Visit: Payer: Medicare Other

## 2019-11-12 ENCOUNTER — Ambulatory Visit: Payer: Medicare HMO | Attending: Internal Medicine

## 2019-11-12 DIAGNOSIS — Z23 Encounter for immunization: Secondary | ICD-10-CM | POA: Insufficient documentation

## 2019-11-12 NOTE — Progress Notes (Signed)
   Covid-19 Vaccination Clinic  Name:  [name]    MRN: [id number] DOB: 1941-08-30  11/12/2019  [name] was observed post Covid-19 immunization for 15 minutes without incidence. She was provided with Vaccine Information Sheet and instruction to access the V-Safe system.   [name] was instructed to call 911 with any severe reactions post vaccine: [name] Difficulty breathing  . Swelling of your face and throat  . A fast heartbeat  . A bad rash all over your body  . Dizziness and weakness    Immunizations Administered    Name Date Dose VIS Date Route   Pfizer COVID-19 Vaccine 11/12/2019 10:50 AM 0.3 mL 09/14/2019 Intramuscular   Manufacturer: [employer]   Lot: [id number]   [place]: [id number]

## 2019-11-13 ENCOUNTER — Other Ambulatory Visit: Payer: Self-pay | Admitting: Psychiatry

## 2019-11-13 DIAGNOSIS — D472 Monoclonal gammopathy: Secondary | ICD-10-CM

## 2019-11-13 DIAGNOSIS — C9000 Multiple myeloma not having achieved remission: Secondary | ICD-10-CM | POA: Diagnosis not present

## 2019-11-13 DIAGNOSIS — F22 Delusional disorders: Secondary | ICD-10-CM | POA: Diagnosis not present

## 2019-11-15 ENCOUNTER — Telehealth: Payer: Self-pay | Admitting: Oncology

## 2019-11-15 NOTE — Telephone Encounter (Signed)
Last fill 04/09/2019

## 2019-11-15 NOTE — Telephone Encounter (Signed)
Writer received inbasket stating that there was a referral for patient to oncology services and that due to patient seeing Dr. Janese Banks in the past, to schedule appt with her. Writer phoned patient on 11-14-19 to schedule appt; however, patient was in the middle of having transportation issues and was trying to work things out to get to an appt at the time of the phone call. Writer politely hung up and phoned patient again on 11-15-19 to schedule appt with Dr. Janese Banks. Patient stated that she did not feel that she wanted to see Dr. Janese Banks again as she did not understand why Dr. Janese Banks did not assist patient with therapy at her last visit and sent her home that day. Patient stated that she felt that Dr. Janese Banks was dismissing her from her services. Writer discussed options with patient: see Dr. Janese Banks for another chance, change to another MD at the [facility], or change to [facility] as patient is currently living in [place]. Patient stated that she had already talked with her PCP and that she was looking to establish with [facility]. Writer phoned supervisor to assist with transferral of services to the [facility] and supervisor to assist with arranging this.

## 2019-11-21 ENCOUNTER — Telehealth: Payer: Self-pay | Admitting: Hematology and Oncology

## 2019-11-21 ENCOUNTER — Telehealth: Payer: Self-pay | Admitting: Oncology

## 2019-11-21 NOTE — Telephone Encounter (Signed)
Received a new pt referral from Dr. Ninfa Linden at [place] for multiple myeloma. I cld and lf the pt a vm to schedule an appt w/Dr. Lorenso Courier.

## 2019-11-21 NOTE — Telephone Encounter (Signed)
Per conversation with scheduler, patient would like to transfer to [facility] for care as she lives in [place] and is having transportation concerns.  I called [facility] Intake Coordinator at [facility] requesting that an updated referral be sent to [facility] for scheduling.  I offered my call back number if there were questions or concerns as I had to leave a voicemail.

## 2019-11-22 ENCOUNTER — Other Ambulatory Visit: Payer: Self-pay | Admitting: Hematology

## 2019-11-22 DIAGNOSIS — C9000 Multiple myeloma not having achieved remission: Secondary | ICD-10-CM

## 2019-11-23 ENCOUNTER — Telehealth: Payer: Self-pay | Admitting: Hematology and Oncology

## 2019-11-23 ENCOUNTER — Other Ambulatory Visit: Payer: Self-pay | Admitting: Psychiatry

## 2019-11-23 NOTE — Telephone Encounter (Signed)
Please get her scheduled with Dr. Clovis Pu, she's not been in since Sept.

## 2019-11-23 NOTE — Telephone Encounter (Signed)
Received a new pt referral from Dr. Ninfa Linden at [place] for multiple myeloma. Pt has seen Dr. Irene Limbo previously, but wanted another provider. She's been scheduled to see Dr. Lorenso Courier on 3/1 at 2pm. Pt aware to arrive 15 minutes early.

## 2019-11-23 NOTE — Telephone Encounter (Signed)
Received a new pt referral from Dr. Ninfa Linden at [facility] for multiple myeloma. Pt has seen Dr. Irene Limbo previously, but wanted another provider. She's been scheduled to see Dr. Lorenso Courier on 3/1 at

## 2019-11-26 ENCOUNTER — Ambulatory Visit: Payer: Medicare Other

## 2019-12-03 ENCOUNTER — Other Ambulatory Visit: Payer: BLUE CROSS/BLUE SHIELD

## 2019-12-03 ENCOUNTER — Ambulatory Visit: Payer: BLUE CROSS/BLUE SHIELD | Admitting: Hematology and Oncology

## 2019-12-07 ENCOUNTER — Ambulatory Visit: Payer: BLUE CROSS/BLUE SHIELD

## 2019-12-07 ENCOUNTER — Telehealth: Payer: Self-pay | Admitting: Hematology and Oncology

## 2019-12-07 NOTE — Telephone Encounter (Signed)
Our office received a new pt referral from Dr. Ninfa Linden at [facility] for multiple myeloma. Pt needs a female provider for her care. Our two female providers aren't able to provide the care that the patient needs. The referral from Dr. Ninfa Linden is being declined. I notified the referral coordinator at [facility] that we are declining the pt's referral.

## 2019-12-11 ENCOUNTER — Ambulatory Visit: Payer: Medicare HMO | Attending: Internal Medicine

## 2019-12-11 ENCOUNTER — Encounter (HOSPITAL_COMMUNITY): Payer: Self-pay

## 2019-12-11 ENCOUNTER — Emergency Department (HOSPITAL_COMMUNITY)
Admission: EM | Admit: 2019-12-11 | Discharge: 2019-12-11 | Disposition: A | Discharge status: Home / Self Care | Payer: Medicare HMO | Attending: Emergency Medicine | Admitting: Emergency Medicine

## 2019-12-11 DIAGNOSIS — D472 Monoclonal gammopathy: Secondary | ICD-10-CM | POA: Insufficient documentation

## 2019-12-11 DIAGNOSIS — R4182 Altered mental status, unspecified: Secondary | ICD-10-CM | POA: Diagnosis not present

## 2019-12-11 DIAGNOSIS — T7840XA Allergy, unspecified, initial encounter: Secondary | ICD-10-CM

## 2019-12-11 DIAGNOSIS — M79621 Pain in right upper arm: Secondary | ICD-10-CM | POA: Insufficient documentation

## 2019-12-11 DIAGNOSIS — Z23 Encounter for immunization: Secondary | ICD-10-CM

## 2019-12-11 DIAGNOSIS — G4489 Other headache syndrome: Secondary | ICD-10-CM | POA: Diagnosis not present

## 2019-12-11 DIAGNOSIS — Z8673 Personal history of transient ischemic attack (TIA), and cerebral infarction without residual deficits: Secondary | ICD-10-CM | POA: Insufficient documentation

## 2019-12-11 DIAGNOSIS — Z79899 Other long term (current) drug therapy: Secondary | ICD-10-CM | POA: Diagnosis not present

## 2019-12-11 DIAGNOSIS — I10 Essential (primary) hypertension: Secondary | ICD-10-CM | POA: Diagnosis not present

## 2019-12-11 NOTE — Discharge Instructions (Signed)
Please return for worsening symptoms, especially feeling like he may pass out trouble breathing vomiting or diarrhea.  Please follow-up with your family doctor.  With the reaction that you had to this vaccine you likely should not receive another vaccine of this type.  Please discuss with your family doctor about those risks if they were to offer a third round.

## 2019-12-11 NOTE — ED Triage Notes (Addendum)
Pt BIBA from [place] vaccination site at [facility]. Pt states that she felt like the vaccine went in her eyes. Pt had acute onset of confusion. Pt did not improve over 2 hours and EMS was called. Pt lives at home and drove herself to the vaccine appt.   Pt called multiple neighbors en route, but 2 sons did not answer to determine pt's baseline.   VSS with EMS.  Stroke screen passed.  Pt states that her eyes were burning but has resolved since. 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   Pt states that she does chemo, last treatment 4 months ago.

## 2019-12-11 NOTE — ED Provider Notes (Signed)
I see the patient in signout from Dr. Sherry Ruffing, briefly the patient is a 79 year old female with a chief complaint of an acute reaction after having her second coronavirus vaccination.  Plan was to observe her in the ED.  Patient has been here for about 2 hours and has not had any significant change.  She is requesting discharge home.  PCP follow-up.   Deno Etienne, DO 12/11/19 1724

## 2019-12-11 NOTE — ED Provider Notes (Signed)
[facility] DEPT Provider Note   CSN: [id number] Arrival date & time: 12/11/19  1529     History No chief complaint on file.   [name] is a 79 y.o. female.  The history is provided by the patient, the EMS personnel and medical records. No language interpreter was used.  Allergic Reaction Presenting symptoms: no difficulty breathing, no difficulty swallowing, no rash, no swelling and no wheezing   Severity:  Mild Prior allergic episodes:  No prior episodes Context: medications (covid shot)   Context: not animal exposure   Relieved by:  Rest Worsened by:  Nothing Ineffective treatments:  None tried      Past Medical History:  Diagnosis Date  . Anxiety   . Arthritis   . Asthma   . Cancer (HCC)    THYROID, SKIN, NOSE, BONE  . Chronic kidney disease   . Diverticulosis    Pt reported on 03/15/12  . Eye cancer [facility])    R eye cancer - 3-4 y ago  . Eye problems    LOST VISION RIGHT EYE  . Hernia   . History of blood clots    LEG  . Hyperlipidemia   . Hypertension   . Melanoma ([place])   . Melanoma ([place]) 1989   chest  . MGUS (monoclonal gammopathy of unknown significance)   . MGUS (monoclonal gammopathy of unknown significance)   . Mitral valve prolapse   . Perforated bowel ([place])   . Peritonitis ([place])   . Smoldering multiple myeloma ([place])   . Stroke ([place])   . Thyroid disease     Patient Active Problem List   Diagnosis Date Noted  . Encounter for antineoplastic chemotherapy 09/11/2018  . Encounter for antineoplastic immunotherapy 07/17/2018  . Goals of care, counseling/discussion 07/17/2018  . Weight loss 07/15/2018  . Multiple myeloma not having achieved remission ([place]) 12/20/2017  . Counseling regarding advanced care planning and goals of care 12/20/2017  . Port-A-Cath in place 12/16/2017  . HCAP (healthcare-associated pneumonia) 11/19/2017  . Immunocompromised state due to drug therapy 11/19/2017  . History of CVA  (cerebrovascular accident) 11/19/2017  . Hypertension 11/19/2017  . Hyperlipidemia 11/19/2017  . Asthma 11/19/2017  . 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 Hypothyroidism, adult 11/19/2017  . Anxiety disorder 11/19/2017  . Mild dementia ([place]) 11/19/2017  . Periorbital edema 01/22/2015  . Allergic rhinitis 08/12/2014  . Anxiety 07/04/2014  . 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[place]) 07/04/2014  . Multiple myeloma without remission ([place]) 11/01/2013  . Cerebral vascular accident ([place]) 02/19/2013  . Bloodgood disease 02/19/2013  . Acid reflux 02/19/2013  . Blood in the urine 02/19/2013  . Billowing mitral valve 02/19/2013  . Adenomatous colon polyp 02/19/2013  . 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 Cataract, nuclear 12/16/2011  . Intragel vitreous hemorrhage ([place]) 12/16/2011  . Age-related macular degeneration, dry 12/02/2011  . Post-radiation retinopathy 12/02/2011  . Pseudophakia 12/02/2011  . MGUS (monoclonal gammopathy of unknown significance) 11/26/2011  . 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 Extrinsic asthma 09/21/2011  . Colon, diverticulosis 07/26/2011  . Benign hypertensive heart disease without heart failure 03/31/2011  . Hypercholesterolemia 03/31/2011  . Hypothyroidism 03/31/2011  . History of melanoma 03/31/2011  . Hx of thyroid cancer 03/31/2011  . H/O malignant melanoma of skin 03/31/2011    Past Surgical History:  Procedure Laterality Date  . ABDOMINAL HYSTERECTOMY    . BREAST LUMPECTOMY  12/2010   R breast  . BREAST LUMPECTOMY  2004   L axilla neg for cancer.  [name] CARDIOVASCULAR STRESS TEST  2006   NORMAL  . CYST EXCISION N/A 09/25/2019   Procedure: EXCISION OF SEBACEOUS CYST RIGHT LOWER CHEST;  Surgeon: Coralie Keens, MD;  Location: [facility];  Service: General;  Laterality: N/A;  . [place]  . MELANOMA EXCISION     R eye  . ROTATOR CUFF REPAIR  2004    RIGHT SHOULDER  . SKIN CANCER EXCISION  2011, 2013   squamous cell of nose x 2  . TRANSTHORACIC ECHOCARDIOGRAM  2006     OB History   No obstetric history on file.     Family History  Problem Relation Age of Onset  . Asthma Father   . Diabetes Father   . Heart failure Father   . Cancer Father        Prostate and kidney cancer  . Emphysema Father   . Diabetes Sister   . Cancer Sister   . Stroke Mother 76       Her mother passed away from this stroke at the age of 5  . Cancer Brother        brain cancer & prostate cancer  . Cervical cancer Sister   . Cancer Sister        Recurrent cancer involving the neck and the jaw    Social History   Tobacco Use  . Smoking status: Passive Smoke Exposure - Never Smoker  . Smokeless tobacco: Never Used  . Tobacco comment: Exposure rarely through parents but significant exposure at work  Substance Use Topics  . Alcohol use: No    Alcohol/week: 0.0 standard drinks  . Drug use: No    Home Medications Prior to Admission medications   Medication Sig Start Date End Date Taking? Authorizing Provider  acetaminophen (TYLENOL) 500 MG tablet Take 250-500 mg by mouth every 6 (six) hours as needed for mild pain, moderate pain, fever or headache.     [provider]  ALPRAZolam (XANAX) 0.5 MG tablet TAKE 1/2 TO 1 (ONE-HALF TO ONE) TABLET BY MOUTH UP TO THREE TIMES DAILY AS NEEDED FOR ANXIETY 11/15/19   Cottle, Billey Co., MD  amLODipine (NORVASC) 10 MG tablet Take 10 mg by mouth every evening.     [provider]  aspirin 81 MG tablet Chew 81 mg by mouth at bedtime.  07/26/11   [provider]  azelastine (ASTELIN) 0.1 % nasal spray Place 1 spray into both nostrils daily as needed for allergies.  05/08/17   [provider]  carboxymethylcellulose (REFRESH TEARS) 0.5 % SOLN Place 1 drop into both eyes 3 (three) times daily as needed (for allergies).    [provider]  donepezil (ARICEPT) 10 MG tablet  donepezil 10 mg tablet    [provider]  estradiol (ESTRACE) 0.1 MG/GM vaginal cream INSERT 1 APPLICATORFUL VAGINALLY EVERY NIGHT 05/11/19   [provider]  fexofenadine (ALLEGRA) 180 MG tablet Take 180 mg by mouth 2 (two) times daily as needed for allergies.     [provider]  fluticasone (FLONASE) 50 MCG/ACT nasal spray Place 1 spray into both nostrils 2 (two) times daily. Patient taking differently: Place 1 spray into both nostrils 2 (two) times daily as needed for allergies.  07/09/15   Javier Glazier, MD  levothyroxine (SYNTHROID, LEVOTHROID) 137 MCG tablet Take 137 mcg by mouth daily  before breakfast.     [provider]  lidocaine (XYLOCAINE) 2 % jelly APPLY 1 TO 5 TIMES DAILY AS NEEDED FOR PAIN 05/14/19   [provider]  loperamide (IMODIUM A-D) 2 MG tablet Take 2-4 mg by mouth 4 (four) times daily as needed for diarrhea or loose stools.     [provider]  MICARDIS 40 MG tablet Take 1 tablet (40 mg total) by mouth daily. 01/05/17   Burtis Junes, NP  OLANZapine (ZYPREXA) 2.5 MG tablet TAKE 1 TABLET BY MOUTH AT BEDTIME 11/23/19   Cottle, Billey Co., MD  Polyethyl Glycol-Propyl Glycol (SYSTANE) 0.4-0.3 % SOLN Place 1 drop into both eyes 2 (two) times daily as needed (for allergies).    [provider]  sertraline (ZOLOFT) 25 MG tablet Take 25 mg by mouth. Hardly ever 12/13/18   [provider]  simvastatin (ZOCOR) 20 MG tablet Take 1 tablet (20 mg total) by mouth every evening. 10/12/19   Burtis Junes, NP  tobramycin-dexamethasone [facility]) ophthalmic solution tobramycin 0.3 %-dexamethasone 0.1 % eye drops,suspension    [provider]  traMADol (ULTRAM) 50 MG tablet Take 1 tablet (50 mg total) by mouth every 6 (six) hours as needed for moderate pain or severe pain. 09/25/19   Coralie Keens, MD  traZODone (DESYREL) 50 MG tablet Take 50 mg by mouth at bedtime. 02/17/19   [provider]   valACYclovir HCl (VALTREX PO) Take by mouth.    [provider]    Allergies    Aldactone [spironolactone], Atorvastatin, Doxycycline, Fluconazole, Losartan, Quinolones, Revlimid [lenalidomide], Sertraline hcl, Hydrocodone, Levofloxacin, Prednisolone, Sertraline, Tramadol, Levofloxacin, Metronidazole, Prednisone, Pseudoephedrine, Pseudoephedrine hcl, Sertraline hcl, Sudafed [pseudoephedrine hcl], Trazodone and nefazodone, Trazodone hcl, Hydrocodone-acetaminophen, Latex, and Tape  Review of Systems   Review of Systems  Constitutional: Negative for chills, diaphoresis, fatigue and fever.  HENT: Negative for congestion, ear pain, sore throat and trouble swallowing.   Eyes: Negative for photophobia, pain and visual disturbance.  Respiratory: Negative for cough, chest tightness, shortness of breath, wheezing and stridor.   Cardiovascular: Negative for chest pain and palpitations.  Gastrointestinal: Negative for abdominal pain and vomiting.  Genitourinary: Negative for dysuria, flank pain and hematuria.  Musculoskeletal: Positive for myalgias (R deltoid soreness). Negative for arthralgias, back pain, neck pain and neck stiffness.  Skin: Negative for color change, rash and wound.  Neurological: Negative for dizziness, seizures, syncope, weakness, light-headedness, numbness and headaches.  Psychiatric/Behavioral: Positive for confusion. Negative for agitation.  All other systems reviewed and are negative.   Physical Exam Updated Vital Signs BP (!) 157/93   Pulse 69   Temp 97.9 F (36.6 C) (Oral)   Resp 13   SpO2 99%   Physical Exam Vitals and nursing note reviewed.  Constitutional:      General: She is not in acute distress.    Appearance: She is well-developed. She is not ill-appearing, toxic-appearing or diaphoretic.  HENT:     Head: Normocephalic and atraumatic.     Right Ear: External ear normal.     Left Ear: External ear normal.     Nose: Nose normal. No congestion or  rhinorrhea.     Mouth/Throat:     Mouth: Mucous membranes are moist.     Pharynx: No oropharyngeal exudate or posterior oropharyngeal erythema.  Eyes:     Extraocular Movements: Extraocular movements intact.     Conjunctiva/sclera: Conjunctivae normal.     Pupils: Pupils are equal, round, and reactive to light.  Cardiovascular:  Rate and Rhythm: Normal rate.     Pulses: Normal pulses.     Heart sounds: No murmur.  Pulmonary:     Effort: No respiratory distress.     Breath sounds: No stridor.  Abdominal:     General: Abdomen is flat. There is no distension.     Tenderness: There is no abdominal tenderness. There is no right CVA tenderness, left CVA tenderness, guarding or rebound.  Musculoskeletal:        General: Tenderness present. No deformity.       Arms:     Cervical back: Normal range of motion and neck supple. No tenderness.     Right lower leg: No edema.     Left lower leg: No edema.     Comments: Tenderness in right shoulder.  Normal strength, sensation, and pulse in arms.  Exam otherwise unremarkable.  Skin:    General: Skin is warm.     Capillary Refill: Capillary refill takes less than 2 seconds.     Coloration: Skin is not pale.     Findings: No erythema or rash.  Neurological:     General: No focal deficit present.     Mental Status: She is alert and oriented to person, place, and time.     Cranial Nerves: No cranial nerve deficit.     Sensory: No sensory deficit.     Motor: No weakness or abnormal muscle tone.     Deep Tendon Reflexes: Reflexes are normal and symmetric.  Psychiatric:        Mood and Affect: Mood normal.     ED Results / Procedures / Treatments   Labs (all labs ordered are listed, but only abnormal results are displayed) Labs Reviewed - No data to display  EKG EKG Interpretation  Date/Time:  Tuesday December 11 2019 16:00:32 EST Ventricular Rate:  72 PR Interval:    QRS Duration: 95 QT Interval:  403 QTC Calculation: 441 R  Axis:   34 Text Interpretation: Sinus rhythm Minimal ST depression, lateral leads No significant change since last tracing Confirmed by Deno Etienne [phone]) on 12/11/2019 5:12:42 PM   Radiology No results found.  Procedures Procedures (including critical care time)  Medications Ordered in ED Medications - No data to display  ED Course  I have reviewed the triage vital signs and the nursing notes.  Pertinent labs & imaging results that were available during my care of the patient were reviewed by me and considered in my medical decision making (see chart for details).    MDM Rules/Calculators/A&P                      [name] is a 79 y.o. 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  EMS reports that patient got her second [place] today and shortly after getting it started having some confusion and altered mental status.  Patient reports her pain worsened in her right deltoid and she began feeling confused.  She reportedly kept calling people and did not know what was going on and that she had gotten an injection.  She reports no headache, neck pain, neck stiffness.  No fevers, chills, chest pain or shortness of breath, shortness of breath, wheezing, nausea, urinary symptoms, or GI symptoms.  She denies any other pains.  She denies vision changes or neurologic  deficits.  He was simply altered and confused and disoriented transiently.  EMS reports that with a picture of her son they arrived emergency department she has had drastic improvement in her mental status.  Patient feels that she is back to baseline at this time.  She did try to drive, but was convinced to stay and allow monitoring.  On exam, she is alert and oriented x3.  Her lungs are clear and chest is nontender.  Abdomen  is nontender.  Mild tenderness in her right shoulder but good strength, sensation, and pulse in the right arm.  Exam otherwise unremarkable.  No evidence of urticaria, facial swelling, lip swelling, or tongue swelling.  No stridor or rhonchi.  No wheezing appreciated.  Clinically I suspect that patient had transient altered mental status related to the Covid vaccine.  Do not suspect other more nefarious etiologies or infection.  Do not suspect anaphylaxis given the lack of lip swelling, tongue swelling, or facial swelling.  No rash or wheezing or nausea.  Given her return to baseline and no symptoms at this time.  We agreed to hold on steroids, antihistamine, or epinephrine.  We also agreed to hold on any diagnostic work-up given her return to normal.  We will instead watch her for several hours to make sure she has no symptoms.  She is feeling well and passes a p.o. challenge, anticipate discharge home.  Care transferred to oncoming team while awaiting completion of assessment and monitoring.  Anticipate discharge if she continues to appear well.   Final Clinical Impression(s) / ED Diagnoses Final diagnoses:  Allergic reaction, initial encounter     Clinical Impression: 1. Allergic reaction, initial encounter     Disposition: Care transferred to oncoming team to continue to watch patient.  Anticipate discharge.  This note was prepared with assistance of Systems analyst. Occasional wrong-word or sound-a-like substitutions may have occurred due to the inherent limitations of voice recognition software.     Khalessi Blough, Gwenyth Allegra, MD 12/11/19 [phone]

## 2019-12-11 NOTE — Progress Notes (Signed)
   Covid-19 Vaccination Clinic  Name:  [name]    MRN: [id number] DOB: [birth year]  12/11/2019  [name] was observed post Covid-19 immunization for approximately 1 hr and 30min. Patient complained of being hungry stated she had not eaten all day around.  Ms. Trach was given water and crackers,said she was feeling better but during further assessment she said she felt dizzy and light headed. Blood pressure right arm 160/103 hr 75. No complaints of chest pain or shortness of breath. She was talkative through out episode. Next BP was 184/91 HR 75   . [name] needed to go to the ladies room ambulated to rest room with nurse in attendance. No notable issue with gait.  The patient stated that she was feeling better but the blood pressures where elevated. Decided to ask EMT to do a manuel BP in the right  arm BP 160/82. Even though [name] was verbal, her responses where vague. One minute she felt fine then she was dizzy. [name] was encouraged to call someone, but she felt she might be able to make it. There was a concern with her driving her self home.  EMT performed a cognitive test she stated the wrong year and did not know who the president was when asked in two separate assessments. They also performed a neuro assessment which was uneventful. EMT tried to contact sons but were unable too and patient could not find her phone where we could call a neighbor.  EMT suggested that she should go to the ED to be evaluated to be on the safe side and she agreed.     [name] was instructed to call 911 with any severe reactions post vaccine: [name] Difficulty breathing  . Swelling of face and throat  . A fast heartbeat  . A bad rash all over body  . Dizziness and weakness   Immunizations Administered    Name Date Dose VIS Date Route   Pfizer COVID-19 Vaccine 12/11/2019  1:05 PM 0.3 mL 09/14/2019 Intramuscular   Manufacturer: [place]   Lot: [id number]   [place]: [id number]

## 2019-12-11 NOTE — ED Notes (Signed)
Soha Acton, son [phone] wants an update on his mother.

## 2019-12-24 DIAGNOSIS — Z6821 Body mass index (BMI) 21.0-21.9, adult: Secondary | ICD-10-CM | POA: Diagnosis not present

## 2019-12-24 DIAGNOSIS — C9002 Multiple myeloma in relapse: Secondary | ICD-10-CM | POA: Diagnosis not present

## 2019-12-24 DIAGNOSIS — Z880 Allergy status to penicillin: Secondary | ICD-10-CM | POA: Diagnosis not present

## 2019-12-24 DIAGNOSIS — Z5112 Encounter for antineoplastic immunotherapy: Secondary | ICD-10-CM | POA: Diagnosis not present

## 2019-12-24 DIAGNOSIS — C9000 Multiple myeloma not having achieved remission: Secondary | ICD-10-CM | POA: Diagnosis not present

## 2020-01-14 NOTE — Telephone Encounter (Signed)
Okay to refill #30+1 refill.  Historically the patient has not been compliant with this medicine.  If this is an auto refill it may be unnecessary.

## 2020-01-15 ENCOUNTER — Other Ambulatory Visit: Payer: Self-pay

## 2020-01-15 MED ORDER — OLANZAPINE 2.5 MG PO TABS: 2.5000 mg | ORAL_TABLET | Freq: Every day | ORAL | 1 refills | Status: DC

## 2020-01-15 NOTE — Telephone Encounter (Signed)
Noted refill submitted

## 2020-01-16 DIAGNOSIS — M5441 Lumbago with sciatica, right side: Secondary | ICD-10-CM | POA: Diagnosis not present

## 2020-01-25 DIAGNOSIS — C9000 Multiple myeloma not having achieved remission: Secondary | ICD-10-CM | POA: Diagnosis not present

## 2020-01-25 DIAGNOSIS — Z5112 Encounter for antineoplastic immunotherapy: Secondary | ICD-10-CM | POA: Diagnosis not present

## 2020-01-25 DIAGNOSIS — I160 Hypertensive urgency: Secondary | ICD-10-CM | POA: Diagnosis not present

## 2020-01-25 DIAGNOSIS — F419 Anxiety disorder, unspecified: Secondary | ICD-10-CM | POA: Diagnosis not present

## 2020-01-25 DIAGNOSIS — C9002 Multiple myeloma in relapse: Secondary | ICD-10-CM | POA: Diagnosis not present

## 2020-01-25 DIAGNOSIS — Z6821 Body mass index (BMI) 21.0-21.9, adult: Secondary | ICD-10-CM | POA: Diagnosis not present

## 2020-01-26 DIAGNOSIS — I10 Essential (primary) hypertension: Secondary | ICD-10-CM | POA: Diagnosis not present

## 2020-01-26 DIAGNOSIS — R519 Headache, unspecified: Secondary | ICD-10-CM | POA: Diagnosis not present

## 2020-02-01 DIAGNOSIS — E039 Hypothyroidism, unspecified: Secondary | ICD-10-CM | POA: Diagnosis not present

## 2020-02-01 DIAGNOSIS — I10 Essential (primary) hypertension: Secondary | ICD-10-CM | POA: Diagnosis not present

## 2020-02-01 DIAGNOSIS — R413 Other amnesia: Secondary | ICD-10-CM | POA: Diagnosis not present

## 2020-02-01 DIAGNOSIS — S76311A Strain of muscle, fascia and tendon of the posterior muscle group at thigh level, right thigh, initial encounter: Secondary | ICD-10-CM | POA: Diagnosis not present

## 2020-02-07 ENCOUNTER — Other Ambulatory Visit: Payer: Self-pay

## 2020-02-07 ENCOUNTER — Telehealth: Payer: Self-pay | Admitting: Psychiatry

## 2020-02-07 MED ORDER — OLANZAPINE 2.5 MG PO TABS: 2.5000 mg | ORAL_TABLET | Freq: Every day | ORAL | 1 refills | Status: DC

## 2020-02-07 NOTE — Telephone Encounter (Signed)
Noted will update her current pharmacy, refill last month went to [facility] on [address]. Submitted today to [facility] on [address].

## 2020-02-07 NOTE — Telephone Encounter (Signed)
Pt would like a refill for

## 2020-02-07 NOTE — Telephone Encounter (Signed)
Pt made appt for 6/7. Pt would like a refill on Olanzapine to be sent to [facility] on [address].

## 2020-02-15 DIAGNOSIS — E782 Mixed hyperlipidemia: Secondary | ICD-10-CM | POA: Diagnosis not present

## 2020-02-15 DIAGNOSIS — F0391 Unspecified dementia with behavioral disturbance: Secondary | ICD-10-CM | POA: Diagnosis not present

## 2020-02-15 DIAGNOSIS — I10 Essential (primary) hypertension: Secondary | ICD-10-CM | POA: Diagnosis not present

## 2020-02-15 DIAGNOSIS — E039 Hypothyroidism, unspecified: Secondary | ICD-10-CM | POA: Diagnosis not present

## 2020-02-22 DIAGNOSIS — Z5112 Encounter for antineoplastic immunotherapy: Secondary | ICD-10-CM | POA: Diagnosis not present

## 2020-02-22 DIAGNOSIS — C9000 Multiple myeloma not having achieved remission: Secondary | ICD-10-CM | POA: Diagnosis not present

## 2020-02-22 DIAGNOSIS — Z66 Do not resuscitate: Secondary | ICD-10-CM | POA: Diagnosis not present

## 2020-02-22 DIAGNOSIS — R4689 Other symptoms and signs involving appearance and behavior: Secondary | ICD-10-CM | POA: Diagnosis not present

## 2020-02-22 DIAGNOSIS — C9002 Multiple myeloma in relapse: Secondary | ICD-10-CM | POA: Diagnosis not present

## 2020-02-22 DIAGNOSIS — R4189 Other symptoms and signs involving cognitive functions and awareness: Secondary | ICD-10-CM | POA: Diagnosis not present

## 2020-02-22 DIAGNOSIS — I119 Hypertensive heart disease without heart failure: Secondary | ICD-10-CM | POA: Diagnosis not present

## 2020-02-22 DIAGNOSIS — R634 Abnormal weight loss: Secondary | ICD-10-CM | POA: Diagnosis not present

## 2020-02-26 DIAGNOSIS — Z01419 Encounter for gynecological examination (general) (routine) without abnormal findings: Secondary | ICD-10-CM | POA: Diagnosis not present

## 2020-02-26 DIAGNOSIS — Z6822 Body mass index (BMI) 22.0-22.9, adult: Secondary | ICD-10-CM | POA: Diagnosis not present

## 2020-02-28 ENCOUNTER — Encounter: Payer: Self-pay | Admitting: Psychiatry

## 2020-02-28 ENCOUNTER — Other Ambulatory Visit: Payer: Self-pay

## 2020-02-28 ENCOUNTER — Ambulatory Visit (INDEPENDENT_AMBULATORY_CARE_PROVIDER_SITE_OTHER): Payer: Medicare HMO | Admitting: Psychiatry

## 2020-02-28 DIAGNOSIS — F4001 Agoraphobia with panic disorder: Secondary | ICD-10-CM

## 2020-02-28 DIAGNOSIS — F29 Unspecified psychosis not due to a substance or known physiological condition: Secondary | ICD-10-CM | POA: Diagnosis not present

## 2020-02-28 DIAGNOSIS — F411 Generalized anxiety disorder: Secondary | ICD-10-CM

## 2020-02-28 DIAGNOSIS — G3184 Mild cognitive impairment, so stated: Secondary | ICD-10-CM

## 2020-02-28 DIAGNOSIS — F5105 Insomnia due to other mental disorder: Secondary | ICD-10-CM | POA: Diagnosis not present
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
## 2020-02-28 NOTE — Patient Instructions (Addendum)
Bring meds to all medical appointments  I encourage you to take olanzapine 2.5 mg at night  Take memory pill donepezil every day

## 2020-03-10 ENCOUNTER — Ambulatory Visit: Payer: Medicare HMO | Admitting: Psychiatry

## 2020-03-13 DIAGNOSIS — F0390 Unspecified dementia without behavioral disturbance: Secondary | ICD-10-CM | POA: Diagnosis not present

## 2020-03-13 DIAGNOSIS — M79662 Pain in left lower leg: Secondary | ICD-10-CM | POA: Diagnosis not present

## 2020-03-17 ENCOUNTER — Encounter (HOSPITAL_COMMUNITY): Payer: Self-pay

## 2020-03-17 ENCOUNTER — Ambulatory Visit (HOSPITAL_COMMUNITY)
Admission: EM | Admit: 2020-03-17 | Discharge: 2020-03-17 | Disposition: A | Discharge status: Home / Self Care | Payer: Medicare HMO | Attending: Family Medicine | Admitting: Family Medicine

## 2020-03-17 ENCOUNTER — Other Ambulatory Visit: Payer: Self-pay

## 2020-03-17 DIAGNOSIS — R58 Hemorrhage, not elsewhere classified: Secondary | ICD-10-CM

## 2020-03-17 NOTE — ED Triage Notes (Addendum)
Pt is here with left leg pain that started 2 days ago, pt states she elevated it all day yesterday but still notice knots, states she was unable to get an appt & that she is also a cancer.

## 2020-03-17 NOTE — Discharge Instructions (Addendum)
Return as needed Try warmth to the area Tylenol for pain

## 2020-03-17 NOTE — ED Provider Notes (Signed)
[place]    CSN: [id number] Arrival date & time: 03/17/20  1410      History   Chief Complaint Chief Complaint  Patient presents with  . Leg Pain    HPI [name] is a 79 y.o. female.   HPI   Here for leg pain Does not recall trauma Has a bump that hurts and a bruise Is very concerned about a blood clot Is actively being treated for cancer Has never had a DVT  Is compliant with her treatement Lives alone, remains very active    Past Medical History:  Diagnosis Date  . Anxiety   . Arthritis   . Asthma   . Cancer (HCC)    THYROID, SKIN, NOSE, BONE  . Chronic kidney disease   . Diverticulosis    Pt reported on 03/15/12  . Eye cancer [facility])    R eye cancer - 3-4 y ago  . Eye problems    LOST VISION RIGHT EYE  . Hernia   . History of blood clots    LEG  . Hyperlipidemia   . Hypertension   . Melanoma ([place])   . Melanoma ([place]) 1989   chest  . MGUS (monoclonal gammopathy of unknown significance)   . MGUS (monoclonal gammopathy of unknown significance)   . Mitral valve prolapse   . Perforated bowel ([place])   . Peritonitis ([place])   . Smoldering multiple myeloma ([place])   . Stroke ([place])   . Thyroid disease     Patient Active Problem List   Diagnosis Date Noted  . Encounter for antineoplastic chemotherapy 09/11/2018  . Encounter for antineoplastic immunotherapy 07/17/2018  . Goals of care, counseling/discussion 07/17/2018  . Weight loss 07/15/2018  . Multiple myeloma not having achieved remission ([place]) 12/20/2017  . Counseling regarding advanced care planning and goals of care 12/20/2017  . Port-A-Cath in place 12/16/2017  . Immunocompromised state due to drug therapy 11/19/2017  . History of CVA (cerebrovascular accident) 11/19/2017  . Hypertension 11/19/2017  . Hyperlipidemia 11/19/2017  . Asthma 11/19/2017  . 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 Hypothyroidism, adult 11/19/2017  . Anxiety disorder 11/19/2017  . Mild  dementia ([place]) 11/19/2017  . Periorbital edema 01/22/2015  . Allergic rhinitis 08/12/2014  . 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[place]) 07/04/2014  . Multiple myeloma without remission ([place]) 11/01/2013  . Cerebral vascular accident ([place]) 02/19/2013  . Bloodgood disease 02/19/2013  . Acid reflux 02/19/2013  . Billowing mitral valve 02/19/2013  . Adenomatous colon polyp 02/19/2013  . 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 Cataract, nuclear 12/16/2011  . Intragel vitreous hemorrhage ([place]) 12/16/2011  . Age-related macular degeneration, dry 12/02/2011  . Post-radiation retinopathy 12/02/2011  . Pseudophakia 12/02/2011  . MGUS (monoclonal gammopathy of unknown significance) 11/26/2011  . 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 Extrinsic asthma 09/21/2011  . Colon, diverticulosis 07/26/2011  . Benign hypertensive heart disease without heart failure 03/31/2011  . H/O malignant melanoma of skin 03/31/2011    Past Surgical History:  Procedure Laterality Date  . ABDOMINAL HYSTERECTOMY    . BREAST LUMPECTOMY  12/2010   R breast  . BREAST LUMPECTOMY  2004   L axilla neg for cancer.  [name] CARDIOVASCULAR STRESS TEST  2006   NORMAL  . CYST EXCISION N/A 09/25/2019   Procedure: EXCISION OF SEBACEOUS CYST RIGHT LOWER CHEST;  Surgeon: Coralie Keens, MD;  Location: [place];  Service: General;  Laterality: N/A;  . HERNIA REPAIR    .  LEG SURGERY     VEIN & BLOOD CLOT  . MELANOMA EXCISION     R eye  . ROTATOR CUFF REPAIR  2004   RIGHT SHOULDER  . SKIN CANCER EXCISION  2011, 2013   squamous cell of nose x 2  . TRANSTHORACIC ECHOCARDIOGRAM  2006    OB History   No obstetric history on file.      Home Medications    Prior to Admission medications   Medication Sig Start Date End Date Taking? Authorizing Provider  acetaminophen (TYLENOL) 500 MG tablet Take 250-500 mg by mouth every 6 (six) hours as needed for mild pain, moderate pain, fever or headache.      [provider]  ALPRAZolam (XANAX) 0.5 MG tablet TAKE 1/2 TO 1 (ONE-HALF TO ONE) TABLET BY MOUTH UP TO THREE TIMES DAILY AS NEEDED FOR ANXIETY 11/15/19   Cottle, Carey G Jr., MD  amLODipine (NORVASC) 10 MG tablet Take 10 mg by mouth every evening.     [provider]  aspirin 81 MG tablet Chew 81 mg by mouth at bedtime.  07/26/11   [provider]  azelastine (ASTELIN) 0.1 % nasal spray Place 1 spray into both nostrils daily as needed for allergies.  05/08/17   [provider]  carboxymethylcellulose (REFRESH TEARS) 0.5 % SOLN Place 1 drop into both eyes 3 (three) times daily as needed (for allergies).    [provider]  donepezil (ARICEPT) 10 MG tablet donepezil 10 mg tablet    [provider]  estradiol (ESTRACE) 0.1 MG/GM vaginal cream INSERT 1 APPLICATORFUL VAGINALLY EVERY NIGHT 05/11/19   [provider]  fexofenadine (ALLEGRA) 180 MG tablet Take 180 mg by mouth 2 (two) times daily as needed for allergies.     [provider]  fluticasone (FLONASE) 50 MCG/ACT nasal spray Place 1 spray into both nostrils 2 (two) times daily. Patient taking differently: Place 1 spray into both nostrils 2 (two) times daily as needed for allergies.  07/09/15   Nestor, Jennings E, MD  levothyroxine (SYNTHROID, LEVOTHROID) 137 MCG tablet Take 137 mcg by mouth daily before breakfast.     [provider]  lidocaine (XYLOCAINE) 2 % jelly APPLY 1 TO 5 TIMES DAILY AS NEEDED FOR PAIN 05/14/19   [provider]  loperamide (IMODIUM A-D) 2 MG tablet Take 2-4 mg by mouth 4 (four) times daily as needed for diarrhea or loose stools.     [provider]  MICARDIS 40 MG tablet Take 1 tablet (40 mg total) by mouth daily. 01/05/17   Gerhardt, Lori C, NP  OLANZapine (ZYPREXA) 2.5 MG tablet Take 1 tablet (2.5 mg total) by mouth at bedtime. 02/07/20   Cottle, Carey G Jr., MD  Polyethyl Glycol-Propyl Glycol (SYSTANE) 0.4-0.3 % SOLN Place 1 drop  into both eyes 2 (two) times daily as needed (for allergies).    [provider]  sertraline (ZOLOFT) 25 MG tablet Take 25 mg by mouth. Hardly ever 12/13/18   [provider]  simvastatin (ZOCOR) 20 MG tablet Take 1 tablet (20 mg total) by mouth every evening. 10/12/19   Gerhardt, Lori C, NP  tobramycin-dexamethasone (TOBRADEX) ophthalmic solution tobramycin 0.3 %-dexamethasone 0.1 % eye drops,suspension    [provider]  traMADol (ULTRAM) 50 MG tablet Take 1 tablet (50 mg total) by mouth every 6 (six) hours as needed for moderate pain or severe pain. 09/25/19   Blackman, Douglas, MD  traZODone (DESYREL) 50 MG tablet Take 50 mg by   mouth at bedtime. 02/17/19   [provider]  valACYclovir HCl (VALTREX PO) Take by mouth.    [provider]    Family History Family History  Problem Relation Age of Onset  . Asthma Father   . Diabetes Father   . Heart failure Father   . Cancer Father        Prostate and kidney cancer  . Emphysema Father   . Diabetes Sister   . Cancer Sister   . Stroke Mother 53       Her mother passed away from this stroke at the age of 53  . Cancer Brother        brain cancer & prostate cancer  . Cervical cancer Sister   . Cancer Sister        Recurrent cancer involving the neck and the jaw    Social History Social History   Tobacco Use  . Smoking status: Passive Smoke Exposure - Never Smoker  . Smokeless tobacco: Never Used  . Tobacco comment: Exposure rarely through parents but significant exposure at work  Vaping Use  . Vaping Use: Never used  Substance Use Topics  . Alcohol use: No    Alcohol/week: 0.0 standard drinks  . Drug use: No     Allergies   Aldactone [spironolactone], Atorvastatin, Doxycycline, Fluconazole, Losartan, Quinolones, Revlimid [lenalidomide], Sertraline hcl, Hydrocodone, Levofloxacin, Prednisolone, Sertraline, Tramadol, Levofloxacin, Metronidazole, Prednisone, Pseudoephedrine,  Pseudoephedrine hcl, Sertraline hcl, Sudafed [pseudoephedrine hcl], Trazodone and nefazodone, Trazodone hcl, Hydrocodone-acetaminophen, Latex, and Tape   Review of Systems Review of Systems  Skin: Positive for color change.   Physical Exam Triage Vital Signs ED Triage Vitals  Enc Vitals Group     BP 03/17/20 1442 138/82     Pulse Rate 03/17/20 1442 70     Resp 03/17/20 1442 16     Temp 03/17/20 1442 97.9 F (36.6 C)     Temp Source 03/17/20 1442 Oral     SpO2 03/17/20 1442 96 %     Weight 03/17/20 1440 116 lb (52.6 kg)     Height --      Head Circumference --      Peak Flow --      Pain Score 03/17/20 1440 7     Pain Loc --      Pain Edu? --      Excl. in GC? --    No data found.  Updated Vital Signs BP 138/82 (BP Location: Right Arm)   Pulse 70   Temp 97.9 F (36.6 C) (Oral)   Resp 16   Wt 52.6 kg   SpO2 96%   BMI 21.92 kg/m      Physical Exam Constitutional:      General: She is not in acute distress.    Appearance: She is well-developed and normal weight.  HENT:     Head: Normocephalic and atraumatic.  Eyes:     Conjunctiva/sclera: Conjunctivae normal.     Pupils: Pupils are equal, round, and reactive to light.  Cardiovascular:     Rate and Rhythm: Normal rate.  Pulmonary:     Effort: Pulmonary effort is normal. No respiratory distress.  Abdominal:     General: There is no distension.     Palpations: Abdomen is soft.  Musculoskeletal:        General: Normal range of motion.     Cervical back: Normal range of motion.       Legs:     Comments: No palpable superficial   or deep venous thrombosis in left lower leg  Skin:    General: Skin is warm and dry.  Neurological:     Mental Status: She is alert.     Gait: Gait normal.  Psychiatric:     Comments: Mildly anxious      UC Treatments / Results  Labs (all labs ordered are listed, but only abnormal results are displayed) Labs Reviewed - No data to display  EKG   Radiology No results  found.  Procedures Procedures (including critical care time)  Medications Ordered in UC Medications - No data to display  Initial Impression / Assessment and Plan / UC Course  I have reviewed the triage vital signs and the nursing notes.  Pertinent labs & imaging results that were available during my care of the patient were reviewed by me and considered in my medical decision making (see chart for details).     I reassured the patient that this has all the appearance of a bruise.  She may have bumped her tibial/shin area on a hard object or piece of furniture and have some bruising.  No evidence of any dangerous diagnoses or clot.  Patient reassured.  This resolved with time.  Discussed warmth, Tylenol for pain, return as needed Final Clinical Impressions(s) / UC Diagnoses   Final diagnoses:  Ecchymosis     Discharge Instructions     Return as needed Try warmth to the area Tylenol for pain    ED Prescriptions    None     PDMP not reviewed this encounter.   ,  Sue, MD 03/17/20 1525  

## 2020-03-20 DIAGNOSIS — M79662 Pain in left lower leg: Secondary | ICD-10-CM | POA: Diagnosis not present

## 2020-03-21 DIAGNOSIS — F0391 Unspecified dementia with behavioral disturbance: Secondary | ICD-10-CM | POA: Diagnosis not present

## 2020-03-21 DIAGNOSIS — Z5112 Encounter for antineoplastic immunotherapy: Secondary | ICD-10-CM | POA: Diagnosis not present

## 2020-03-21 DIAGNOSIS — Z885 Allergy status to narcotic agent status: Secondary | ICD-10-CM | POA: Diagnosis not present

## 2020-03-21 DIAGNOSIS — Z7982 Long term (current) use of aspirin: Secondary | ICD-10-CM | POA: Diagnosis not present

## 2020-03-21 DIAGNOSIS — C9002 Multiple myeloma in relapse: Secondary | ICD-10-CM | POA: Diagnosis not present

## 2020-03-21 DIAGNOSIS — Z91048 Other nonmedicinal substance allergy status: Secondary | ICD-10-CM | POA: Diagnosis not present

## 2020-03-21 DIAGNOSIS — Z79899 Other long term (current) drug therapy: Secondary | ICD-10-CM | POA: Diagnosis not present

## 2020-03-21 DIAGNOSIS — C9000 Multiple myeloma not having achieved remission: Secondary | ICD-10-CM | POA: Diagnosis not present

## 2020-03-21 DIAGNOSIS — Z880 Allergy status to penicillin: Secondary | ICD-10-CM | POA: Diagnosis not present

## 2020-03-21 DIAGNOSIS — Z91040 Latex allergy status: Secondary | ICD-10-CM | POA: Diagnosis not present

## 2020-03-21 DIAGNOSIS — Z888 Allergy status to other drugs, medicaments and biological substances status: Secondary | ICD-10-CM | POA: Diagnosis not present

## 2020-03-21 DIAGNOSIS — Z6821 Body mass index (BMI) 21.0-21.9, adult: Secondary | ICD-10-CM | POA: Diagnosis not present

## 2020-04-01 ENCOUNTER — Other Ambulatory Visit: Payer: Self-pay | Admitting: Nurse Practitioner

## 2020-04-01 MED ORDER — SIMVASTATIN 20 MG PO TABS: 20.0000 mg | ORAL_TABLET | Freq: Every evening | ORAL | 0 refills | Status: DC

## 2020-04-01 NOTE — Telephone Encounter (Signed)
*  STAT* If patient is at the pharmacy, call can be transferred to refill team.   1. Which medications need to be refilled? (please list name of each medication and dose if known)  Simvastatin  2. Which pharmacy/location (including street and city if local pharmacy) is medication to be sent to? [facility] [address] [place], [place]  3. Do they need a 30 day or 90 day supply? Was not sure how many

## 2020-04-18 DIAGNOSIS — Z85820 Personal history of malignant melanoma of skin: Secondary | ICD-10-CM | POA: Diagnosis not present

## 2020-04-18 DIAGNOSIS — C9002 Multiple myeloma in relapse: Secondary | ICD-10-CM | POA: Diagnosis not present

## 2020-04-18 DIAGNOSIS — Z5112 Encounter for antineoplastic immunotherapy: Secondary | ICD-10-CM | POA: Diagnosis not present

## 2020-04-18 DIAGNOSIS — C9000 Multiple myeloma not having achieved remission: Secondary | ICD-10-CM | POA: Diagnosis not present

## 2020-04-21 DIAGNOSIS — H31001 Unspecified chorioretinal scars, right eye: Secondary | ICD-10-CM | POA: Diagnosis not present

## 2020-04-21 DIAGNOSIS — H524 Presbyopia: Secondary | ICD-10-CM | POA: Diagnosis not present

## 2020-04-21 DIAGNOSIS — Z961 Presence of intraocular lens: Secondary | ICD-10-CM | POA: Diagnosis not present

## 2020-04-22 DIAGNOSIS — D472 Monoclonal gammopathy: Secondary | ICD-10-CM | POA: Diagnosis not present

## 2020-04-22 DIAGNOSIS — C9000 Multiple myeloma not having achieved remission: Secondary | ICD-10-CM | POA: Diagnosis not present

## 2020-04-22 DIAGNOSIS — R6889 Other general symptoms and signs: Secondary | ICD-10-CM | POA: Diagnosis not present

## 2020-05-08 DIAGNOSIS — Z85840 Personal history of malignant neoplasm of eye: Secondary | ICD-10-CM | POA: Diagnosis not present

## 2020-05-08 DIAGNOSIS — E039 Hypothyroidism, unspecified: Secondary | ICD-10-CM | POA: Diagnosis not present

## 2020-05-08 DIAGNOSIS — E782 Mixed hyperlipidemia: Secondary | ICD-10-CM | POA: Diagnosis not present

## 2020-05-08 DIAGNOSIS — R413 Other amnesia: Secondary | ICD-10-CM | POA: Diagnosis not present

## 2020-05-08 DIAGNOSIS — C9000 Multiple myeloma not having achieved remission: Secondary | ICD-10-CM | POA: Diagnosis not present

## 2020-05-08 DIAGNOSIS — F411 Generalized anxiety disorder: Secondary | ICD-10-CM | POA: Diagnosis not present

## 2020-05-08 DIAGNOSIS — I10 Essential (primary) hypertension: Secondary | ICD-10-CM | POA: Diagnosis not present

## 2020-05-16 DIAGNOSIS — F0391 Unspecified dementia with behavioral disturbance: Secondary | ICD-10-CM | POA: Diagnosis not present

## 2020-05-16 DIAGNOSIS — I10 Essential (primary) hypertension: Secondary | ICD-10-CM | POA: Diagnosis not present

## 2020-05-16 DIAGNOSIS — E039 Hypothyroidism, unspecified: Secondary | ICD-10-CM | POA: Diagnosis not present

## 2020-05-16 DIAGNOSIS — E782 Mixed hyperlipidemia: Secondary | ICD-10-CM | POA: Diagnosis not present

## 2020-05-21 DIAGNOSIS — I129 Hypertensive chronic kidney disease with stage 1 through stage 4 chronic kidney disease, or unspecified chronic kidney disease: Secondary | ICD-10-CM | POA: Diagnosis not present

## 2020-05-21 DIAGNOSIS — D369 Benign neoplasm, unspecified site: Secondary | ICD-10-CM | POA: Diagnosis not present

## 2020-05-21 DIAGNOSIS — E039 Hypothyroidism, unspecified: Secondary | ICD-10-CM | POA: Diagnosis not present

## 2020-05-21 DIAGNOSIS — E785 Hyperlipidemia, unspecified: Secondary | ICD-10-CM | POA: Diagnosis not present

## 2020-05-21 DIAGNOSIS — C73 Malignant neoplasm of thyroid gland: Secondary | ICD-10-CM | POA: Diagnosis not present

## 2020-05-21 DIAGNOSIS — R319 Hematuria, unspecified: Secondary | ICD-10-CM | POA: Diagnosis not present

## 2020-05-21 DIAGNOSIS — N182 Chronic kidney disease, stage 2 (mild): Secondary | ICD-10-CM | POA: Diagnosis not present

## 2020-05-21 DIAGNOSIS — I639 Cerebral infarction, unspecified: Secondary | ICD-10-CM | POA: Diagnosis not present

## 2020-05-21 DIAGNOSIS — R160 Hepatomegaly, not elsewhere classified: Secondary | ICD-10-CM | POA: Diagnosis not present

## 2020-05-21 DIAGNOSIS — C9000 Multiple myeloma not having achieved remission: Secondary | ICD-10-CM | POA: Diagnosis not present

## 2020-05-22 ENCOUNTER — Telehealth: Payer: Self-pay | Admitting: Nurse Practitioner

## 2020-05-22 MED ORDER — SIMVASTATIN 20 MG PO TABS: 20.0000 mg | ORAL_TABLET | Freq: Every evening | ORAL | 0 refills | Status: DC

## 2020-05-22 NOTE — Telephone Encounter (Signed)
Pt's medication was sent to pt's pharmacy as requested. Confirmation received.  °

## 2020-05-22 NOTE — Telephone Encounter (Signed)
*  STAT* If patient is at the pharmacy, call can be transferred to refill team.   1. Which medications need to be refilled? (please list name of each medication and dose if known)  simvastatin (ZOCOR) 20 MG tablet  2. Which pharmacy/location (including street and city if local pharmacy) is medication to be sent to? [place], [place] - [address]  3. Do they need a 30 day or 90 day supply? 30 day supply  Patient has an appt scheduled for 09/15 and is also on the wait list.

## 2020-05-27 DIAGNOSIS — C9000 Multiple myeloma not having achieved remission: Secondary | ICD-10-CM | POA: Diagnosis not present

## 2020-05-29 ENCOUNTER — Telehealth: Payer: Self-pay | Admitting: Nurse Practitioner

## 2020-05-29 NOTE — Telephone Encounter (Signed)
*  STAT* If patient is at the pharmacy, call can be transferred to refill team.   1. Which medications need to be refilled? (please list name of each medication and dose if known) simvastatin (ZOCOR) 20 MG tablet  2. Which pharmacy/location (including street and city if local pharmacy) is medication to be sent to? [place], [place] - [address]  3. Do they need a 30 day or 90 day supply? 90 day   Patient has an appointment 06/18/2020

## 2020-05-29 NOTE — Telephone Encounter (Signed)
Called pt to inform her that her medication was sent to her pharmacy on 05/22/20 for a 30 day supply, because pt has not been seen since 2019 and pt needed to see the provider first, before a 90 day supply. Pt has an upcoming appt on 06/18/20 and pt has enough to last until appt at that time. I advised the pt that if she has any other problems, questions or concerns, to give our office a call back. Pt verbalized understanding.

## 2020-05-31 DIAGNOSIS — I10 Essential (primary) hypertension: Secondary | ICD-10-CM | POA: Diagnosis not present

## 2020-05-31 DIAGNOSIS — H04123 Dry eye syndrome of bilateral lacrimal glands: Secondary | ICD-10-CM | POA: Diagnosis not present

## 2020-06-04 ENCOUNTER — Encounter: Payer: Self-pay | Admitting: *Deleted
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
## 2020-06-16 DIAGNOSIS — F0391 Unspecified dementia with behavioral disturbance: Secondary | ICD-10-CM | POA: Diagnosis not present

## 2020-06-16 DIAGNOSIS — E782 Mixed hyperlipidemia: Secondary | ICD-10-CM | POA: Diagnosis not present

## 2020-06-16 DIAGNOSIS — I10 Essential (primary) hypertension: Secondary | ICD-10-CM | POA: Diagnosis not present

## 2020-06-16 DIAGNOSIS — E039 Hypothyroidism, unspecified: Secondary | ICD-10-CM | POA: Diagnosis not present

## 2020-06-18 ENCOUNTER — Ambulatory Visit: Payer: Medicare HMO | Admitting: Nurse Practitioner

## 2020-06-18 DIAGNOSIS — I10 Essential (primary) hypertension: Secondary | ICD-10-CM | POA: Diagnosis not present

## 2020-06-18 DIAGNOSIS — E782 Mixed hyperlipidemia: Secondary | ICD-10-CM | POA: Diagnosis not present

## 2020-06-18 DIAGNOSIS — K219 Gastro-esophageal reflux disease without esophagitis: Secondary | ICD-10-CM | POA: Diagnosis not present

## 2020-06-18 DIAGNOSIS — E039 Hypothyroidism, unspecified: Secondary | ICD-10-CM | POA: Diagnosis not present

## 2020-06-18 DIAGNOSIS — F0281 Dementia in other diseases classified elsewhere with behavioral disturbance: Secondary | ICD-10-CM | POA: Diagnosis not present

## 2020-06-18 DIAGNOSIS — M5441 Lumbago with sciatica, right side: Secondary | ICD-10-CM | POA: Diagnosis not present

## 2020-06-18 DIAGNOSIS — C9000 Multiple myeloma not having achieved remission: Secondary | ICD-10-CM | POA: Diagnosis not present

## 2020-06-18 DIAGNOSIS — K769 Liver disease, unspecified: Secondary | ICD-10-CM | POA: Diagnosis not present

## 2020-06-18 DIAGNOSIS — F411 Generalized anxiety disorder: Secondary | ICD-10-CM | POA: Diagnosis not present

## 2020-06-20 ENCOUNTER — Telehealth: Payer: Self-pay | Admitting: Internal Medicine

## 2020-06-20 ENCOUNTER — Other Ambulatory Visit: Payer: Self-pay

## 2020-06-20 DIAGNOSIS — F0281 Dementia in other diseases classified elsewhere with behavioral disturbance: Secondary | ICD-10-CM | POA: Diagnosis not present

## 2020-06-20 DIAGNOSIS — F411 Generalized anxiety disorder: Secondary | ICD-10-CM | POA: Diagnosis not present

## 2020-06-20 DIAGNOSIS — M5441 Lumbago with sciatica, right side: Secondary | ICD-10-CM | POA: Diagnosis not present

## 2020-06-20 DIAGNOSIS — E039 Hypothyroidism, unspecified: Secondary | ICD-10-CM | POA: Diagnosis not present

## 2020-06-20 DIAGNOSIS — I10 Essential (primary) hypertension: Secondary | ICD-10-CM | POA: Diagnosis not present

## 2020-06-20 DIAGNOSIS — C9000 Multiple myeloma not having achieved remission: Secondary | ICD-10-CM | POA: Diagnosis not present

## 2020-06-20 DIAGNOSIS — E782 Mixed hyperlipidemia: Secondary | ICD-10-CM | POA: Diagnosis not present

## 2020-06-20 DIAGNOSIS — K219 Gastro-esophageal reflux disease without esophagitis: Secondary | ICD-10-CM | POA: Diagnosis not present

## 2020-06-20 DIAGNOSIS — K769 Liver disease, unspecified: Secondary | ICD-10-CM | POA: Diagnosis not present

## 2020-06-20 MED ORDER — SIMVASTATIN 20 MG PO TABS: 20.0000 mg | ORAL_TABLET | Freq: Every evening | ORAL | 0 refills | Status: DC

## 2020-06-20 NOTE — Telephone Encounter (Signed)
*  STAT* If patient is at the pharmacy, call can be transferred to refill team.   1. Which medications need to be refilled? (please list name of each medication and dose if known) simvastatin 20 mg  2. Which pharmacy/location (including street and city if local pharmacy) is medication to be sent to? [place]   3. Do they need a 30 day or 90 day supply? 30 patient has a apt coming up.

## 2020-06-23 ENCOUNTER — Ambulatory Visit: Payer: Medicare HMO | Admitting: Internal Medicine

## 2020-06-23 DIAGNOSIS — Z79899 Other long term (current) drug therapy: Secondary | ICD-10-CM | POA: Diagnosis not present

## 2020-06-23 DIAGNOSIS — C9000 Multiple myeloma not having achieved remission: Secondary | ICD-10-CM | POA: Diagnosis not present

## 2020-06-23 DIAGNOSIS — D472 Monoclonal gammopathy: Secondary | ICD-10-CM | POA: Diagnosis not present

## 2020-06-23 DIAGNOSIS — C9002 Multiple myeloma in relapse: Secondary | ICD-10-CM | POA: Diagnosis not present

## 2020-06-23 DIAGNOSIS — Z5111 Encounter for antineoplastic chemotherapy: Secondary | ICD-10-CM | POA: Diagnosis not present

## 2020-06-23 DIAGNOSIS — R6889 Other general symptoms and signs: Secondary | ICD-10-CM | POA: Diagnosis not present

## 2020-06-24 DIAGNOSIS — E039 Hypothyroidism, unspecified: Secondary | ICD-10-CM | POA: Diagnosis not present

## 2020-06-24 DIAGNOSIS — I10 Essential (primary) hypertension: Secondary | ICD-10-CM | POA: Diagnosis not present

## 2020-06-24 DIAGNOSIS — K219 Gastro-esophageal reflux disease without esophagitis: Secondary | ICD-10-CM | POA: Diagnosis not present

## 2020-06-24 DIAGNOSIS — F0281 Dementia in other diseases classified elsewhere with behavioral disturbance: Secondary | ICD-10-CM | POA: Diagnosis not present

## 2020-06-24 DIAGNOSIS — F411 Generalized anxiety disorder: Secondary | ICD-10-CM | POA: Diagnosis not present

## 2020-06-24 DIAGNOSIS — K769 Liver disease, unspecified: Secondary | ICD-10-CM | POA: Diagnosis not present

## 2020-06-24 DIAGNOSIS — E782 Mixed hyperlipidemia: Secondary | ICD-10-CM | POA: Diagnosis not present

## 2020-06-24 DIAGNOSIS — M5441 Lumbago with sciatica, right side: Secondary | ICD-10-CM | POA: Diagnosis not present

## 2020-06-24 DIAGNOSIS — C9000 Multiple myeloma not having achieved remission: Secondary | ICD-10-CM | POA: Diagnosis not present

## 2020-06-26 DIAGNOSIS — G3184 Mild cognitive impairment, so stated: Secondary | ICD-10-CM | POA: Diagnosis not present

## 2020-06-26 DIAGNOSIS — E039 Hypothyroidism, unspecified: Secondary | ICD-10-CM | POA: Diagnosis not present

## 2020-06-26 DIAGNOSIS — Z23 Encounter for immunization: Secondary | ICD-10-CM | POA: Diagnosis not present

## 2020-06-27 DIAGNOSIS — K219 Gastro-esophageal reflux disease without esophagitis: Secondary | ICD-10-CM | POA: Diagnosis not present

## 2020-06-27 DIAGNOSIS — E039 Hypothyroidism, unspecified: Secondary | ICD-10-CM | POA: Diagnosis not present

## 2020-06-27 DIAGNOSIS — K769 Liver disease, unspecified: Secondary | ICD-10-CM | POA: Diagnosis not present

## 2020-06-27 DIAGNOSIS — C9000 Multiple myeloma not having achieved remission: Secondary | ICD-10-CM | POA: Diagnosis not present

## 2020-06-27 DIAGNOSIS — F411 Generalized anxiety disorder: Secondary | ICD-10-CM | POA: Diagnosis not present

## 2020-06-27 DIAGNOSIS — E782 Mixed hyperlipidemia: Secondary | ICD-10-CM | POA: Diagnosis not present

## 2020-06-27 DIAGNOSIS — M5441 Lumbago with sciatica, right side: Secondary | ICD-10-CM | POA: Diagnosis not present

## 2020-06-27 DIAGNOSIS — F0281 Dementia in other diseases classified elsewhere with behavioral disturbance: Secondary | ICD-10-CM | POA: Diagnosis not present

## 2020-06-27 DIAGNOSIS — I10 Essential (primary) hypertension: Secondary | ICD-10-CM | POA: Diagnosis not present

## 2020-07-01 ENCOUNTER — Other Ambulatory Visit: Payer: Self-pay | Admitting: Nurse Practitioner

## 2020-07-03 DIAGNOSIS — C9000 Multiple myeloma not having achieved remission: Secondary | ICD-10-CM | POA: Diagnosis not present

## 2020-07-03 DIAGNOSIS — Z5111 Encounter for antineoplastic chemotherapy: Secondary | ICD-10-CM | POA: Diagnosis not present

## 2020-07-03 DIAGNOSIS — C9002 Multiple myeloma in relapse: Secondary | ICD-10-CM | POA: Diagnosis not present

## 2020-07-07 DIAGNOSIS — E039 Hypothyroidism, unspecified: Secondary | ICD-10-CM | POA: Diagnosis not present

## 2020-07-07 DIAGNOSIS — E782 Mixed hyperlipidemia: Secondary | ICD-10-CM | POA: Diagnosis not present

## 2020-07-07 DIAGNOSIS — I10 Essential (primary) hypertension: Secondary | ICD-10-CM | POA: Diagnosis not present

## 2020-07-07 DIAGNOSIS — F0391 Unspecified dementia with behavioral disturbance: Secondary | ICD-10-CM | POA: Diagnosis not present

## 2020-07-08 DIAGNOSIS — M5441 Lumbago with sciatica, right side: Secondary | ICD-10-CM | POA: Diagnosis not present

## 2020-07-08 DIAGNOSIS — C9000 Multiple myeloma not having achieved remission: Secondary | ICD-10-CM | POA: Diagnosis not present

## 2020-07-08 DIAGNOSIS — K769 Liver disease, unspecified: Secondary | ICD-10-CM | POA: Diagnosis not present

## 2020-07-08 DIAGNOSIS — I10 Essential (primary) hypertension: Secondary | ICD-10-CM | POA: Diagnosis not present

## 2020-07-08 DIAGNOSIS — F411 Generalized anxiety disorder: Secondary | ICD-10-CM | POA: Diagnosis not present

## 2020-07-08 DIAGNOSIS — F0281 Dementia in other diseases classified elsewhere with behavioral disturbance: Secondary | ICD-10-CM | POA: Diagnosis not present

## 2020-07-08 DIAGNOSIS — E039 Hypothyroidism, unspecified: Secondary | ICD-10-CM | POA: Diagnosis not present

## 2020-07-08 DIAGNOSIS — E782 Mixed hyperlipidemia: Secondary | ICD-10-CM | POA: Diagnosis not present

## 2020-07-08 DIAGNOSIS — K219 Gastro-esophageal reflux disease without esophagitis: Secondary | ICD-10-CM | POA: Diagnosis not present

## 2020-07-09 DIAGNOSIS — K219 Gastro-esophageal reflux disease without esophagitis: Secondary | ICD-10-CM | POA: Diagnosis not present

## 2020-07-09 DIAGNOSIS — I10 Essential (primary) hypertension: Secondary | ICD-10-CM | POA: Diagnosis not present

## 2020-07-09 DIAGNOSIS — M5441 Lumbago with sciatica, right side: Secondary | ICD-10-CM | POA: Diagnosis not present

## 2020-07-09 DIAGNOSIS — F411 Generalized anxiety disorder: Secondary | ICD-10-CM | POA: Diagnosis not present

## 2020-07-09 DIAGNOSIS — F0281 Dementia in other diseases classified elsewhere with behavioral disturbance: Secondary | ICD-10-CM | POA: Diagnosis not present

## 2020-07-09 DIAGNOSIS — K769 Liver disease, unspecified: Secondary | ICD-10-CM | POA: Diagnosis not present

## 2020-07-09 DIAGNOSIS — E782 Mixed hyperlipidemia: Secondary | ICD-10-CM | POA: Diagnosis not present

## 2020-07-09 DIAGNOSIS — C9000 Multiple myeloma not having achieved remission: Secondary | ICD-10-CM | POA: Diagnosis not present

## 2020-07-09 DIAGNOSIS — E039 Hypothyroidism, unspecified: Secondary | ICD-10-CM | POA: Diagnosis not present

## 2020-07-10 DIAGNOSIS — Z5111 Encounter for antineoplastic chemotherapy: Secondary | ICD-10-CM | POA: Diagnosis not present

## 2020-07-10 DIAGNOSIS — C9002 Multiple myeloma in relapse: Secondary | ICD-10-CM | POA: Diagnosis not present

## 2020-07-10 DIAGNOSIS — Z20822 Contact with and (suspected) exposure to covid-19: Secondary | ICD-10-CM | POA: Diagnosis not present

## 2020-07-10 DIAGNOSIS — C9000 Multiple myeloma not having achieved remission: Secondary | ICD-10-CM | POA: Diagnosis not present

## 2020-07-14 ENCOUNTER — Ambulatory Visit: Payer: Medicare HMO | Admitting: Physician Assistant

## 2020-07-14 ENCOUNTER — Other Ambulatory Visit: Payer: Self-pay

## 2020-07-14 VITALS — BP 106/60 | HR 98 | Ht 61.0 in | Wt 112.6 lb

## 2020-07-14 DIAGNOSIS — K219 Gastro-esophageal reflux disease without esophagitis: Secondary | ICD-10-CM | POA: Diagnosis not present

## 2020-07-14 DIAGNOSIS — C9000 Multiple myeloma not having achieved remission: Secondary | ICD-10-CM | POA: Diagnosis not present

## 2020-07-14 DIAGNOSIS — E782 Mixed hyperlipidemia: Secondary | ICD-10-CM

## 2020-07-14 DIAGNOSIS — F411 Generalized anxiety disorder: Secondary | ICD-10-CM | POA: Diagnosis not present

## 2020-07-14 DIAGNOSIS — E039 Hypothyroidism, unspecified: Secondary | ICD-10-CM | POA: Diagnosis not present

## 2020-07-14 DIAGNOSIS — I10 Essential (primary) hypertension: Secondary | ICD-10-CM

## 2020-07-14 DIAGNOSIS — Z8673 Personal history of transient ischemic attack (TIA), and cerebral infarction without residual deficits: Secondary | ICD-10-CM

## 2020-07-14 DIAGNOSIS — M5441 Lumbago with sciatica, right side: Secondary | ICD-10-CM | POA: Diagnosis not present

## 2020-07-14 DIAGNOSIS — K769 Liver disease, unspecified: Secondary | ICD-10-CM | POA: Diagnosis not present

## 2020-07-14 DIAGNOSIS — F0281 Dementia in other diseases classified elsewhere with behavioral disturbance: Secondary | ICD-10-CM | POA: Diagnosis not present

## 2020-07-14 NOTE — Patient Instructions (Addendum)
Medication Instructions:  Your physician recommends that you continue on your current medications as directed. Please refer to the Current Medication list given to you today.  *If you need a refill on your cardiac medications before your next appointment, please call your pharmacy*   Lab Work: None ordered  If you have labs (blood work) drawn today and your tests are completely normal, you will receive your results only by: [name] MyChart Message (if you have MyChart) OR . A paper copy in the mail If you have any lab test that is abnormal or we need to change your treatment, we will call you to review the results.   Testing/Procedures: None ordered   Follow-Up: At [facility], you and your health needs are our priority.  As part of our continuing mission to provide you with exceptional heart care, we have created designated Provider Care Teams.  These Care Teams include your primary Cardiologist (physician) and Advanced Practice Providers (APPs -  Physician Assistants and Nurse Practitioners) who all work together to provide you with the care you need, when you need it.  We recommend signing up for the patient portal called "MyChart".  Sign up information is provided on this After Visit Summary.  MyChart is used to connect with patients for Virtual Visits (Telemedicine).  Patients are able to view lab/test results, encounter notes, upcoming appointments, etc.  Non-urgent messages can be sent to your provider as well.   To learn more about what you can do with MyChart, go to [email or web address].    Your next appointment:   1 year   The format for your next appointment:   In Person  Provider:   Dorris Carnes, MD   Other Instructions
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
## 2020-07-17 DIAGNOSIS — E782 Mixed hyperlipidemia: Secondary | ICD-10-CM | POA: Diagnosis not present

## 2020-07-17 DIAGNOSIS — M5441 Lumbago with sciatica, right side: Secondary | ICD-10-CM | POA: Diagnosis not present

## 2020-07-17 DIAGNOSIS — C9000 Multiple myeloma not having achieved remission: Secondary | ICD-10-CM | POA: Diagnosis not present

## 2020-07-17 DIAGNOSIS — K219 Gastro-esophageal reflux disease without esophagitis: Secondary | ICD-10-CM | POA: Diagnosis not present

## 2020-07-17 DIAGNOSIS — K769 Liver disease, unspecified: Secondary | ICD-10-CM | POA: Diagnosis not present

## 2020-07-17 DIAGNOSIS — F411 Generalized anxiety disorder: Secondary | ICD-10-CM | POA: Diagnosis not present

## 2020-07-17 DIAGNOSIS — E039 Hypothyroidism, unspecified: Secondary | ICD-10-CM | POA: Diagnosis not present

## 2020-07-17 DIAGNOSIS — F0281 Dementia in other diseases classified elsewhere with behavioral disturbance: Secondary | ICD-10-CM | POA: Diagnosis not present

## 2020-07-17 DIAGNOSIS — I10 Essential (primary) hypertension: Secondary | ICD-10-CM | POA: Diagnosis not present

## 2020-07-23 ENCOUNTER — Telehealth: Payer: Self-pay | Admitting: *Deleted

## 2020-07-23 NOTE — Telephone Encounter (Signed)
Received call from patient seeking to transfer care to this office. Reviewed chart and saw that she has been seen by multiple MD's in the [facility] practice and it has been advised that she only be seen by female MDs. Since Dr Marin Olp is our only physician at this time, patient notified that we cannot accept referral.

## 2020-07-24 DIAGNOSIS — C9002 Multiple myeloma in relapse: Secondary | ICD-10-CM | POA: Diagnosis not present

## 2020-07-24 DIAGNOSIS — Z5111 Encounter for antineoplastic chemotherapy: Secondary | ICD-10-CM | POA: Diagnosis not present

## 2020-07-24 DIAGNOSIS — R6889 Other general symptoms and signs: Secondary | ICD-10-CM | POA: Diagnosis not present

## 2020-07-24 DIAGNOSIS — D472 Monoclonal gammopathy: Secondary | ICD-10-CM | POA: Diagnosis not present

## 2020-07-24 DIAGNOSIS — C9000 Multiple myeloma not having achieved remission: Secondary | ICD-10-CM | POA: Diagnosis not present

## 2020-07-24 DIAGNOSIS — Z20822 Contact with and (suspected) exposure to covid-19: Secondary | ICD-10-CM | POA: Diagnosis not present

## 2020-07-24 DIAGNOSIS — Z79899 Other long term (current) drug therapy: Secondary | ICD-10-CM | POA: Diagnosis not present

## 2020-07-28 DIAGNOSIS — N760 Acute vaginitis: Secondary | ICD-10-CM | POA: Diagnosis not present

## 2020-07-30 DIAGNOSIS — R4182 Altered mental status, unspecified: Secondary | ICD-10-CM | POA: Diagnosis not present

## 2020-07-30 DIAGNOSIS — N390 Urinary tract infection, site not specified: Secondary | ICD-10-CM | POA: Diagnosis not present

## 2020-08-06 DIAGNOSIS — R102 Pelvic and perineal pain: Secondary | ICD-10-CM | POA: Diagnosis not present

## 2020-08-06 DIAGNOSIS — R309 Painful micturition, unspecified: Secondary | ICD-10-CM | POA: Diagnosis not present

## 2020-08-07 DIAGNOSIS — G309 Alzheimer's disease, unspecified: Secondary | ICD-10-CM | POA: Diagnosis not present

## 2020-08-07 DIAGNOSIS — Z5111 Encounter for antineoplastic chemotherapy: Secondary | ICD-10-CM | POA: Diagnosis not present

## 2020-08-07 DIAGNOSIS — C9000 Multiple myeloma not having achieved remission: Secondary | ICD-10-CM | POA: Diagnosis not present

## 2020-08-07 DIAGNOSIS — Z0184 Encounter for antibody response examination: Secondary | ICD-10-CM | POA: Diagnosis not present

## 2020-08-07 DIAGNOSIS — Z01812 Encounter for preprocedural laboratory examination: Secondary | ICD-10-CM | POA: Diagnosis not present

## 2020-08-07 DIAGNOSIS — Z20822 Contact with and (suspected) exposure to covid-19: Secondary | ICD-10-CM | POA: Diagnosis not present

## 2020-08-07 DIAGNOSIS — F0390 Unspecified dementia without behavioral disturbance: Secondary | ICD-10-CM | POA: Diagnosis not present

## 2020-08-11 ENCOUNTER — Other Ambulatory Visit: Payer: Self-pay | Admitting: Obstetrics and Gynecology

## 2020-08-11 DIAGNOSIS — N390 Urinary tract infection, site not specified: Secondary | ICD-10-CM | POA: Diagnosis not present

## 2020-08-11 DIAGNOSIS — R102 Pelvic and perineal pain: Secondary | ICD-10-CM

## 2020-08-13 DIAGNOSIS — G309 Alzheimer's disease, unspecified: Secondary | ICD-10-CM | POA: Diagnosis not present

## 2020-08-13 DIAGNOSIS — C9000 Multiple myeloma not having achieved remission: Secondary | ICD-10-CM | POA: Diagnosis not present

## 2020-08-14 DIAGNOSIS — R102 Pelvic and perineal pain: Secondary | ICD-10-CM | POA: Diagnosis not present

## 2020-08-15 ENCOUNTER — Ambulatory Visit: Payer: Medicare HMO | Admitting: Internal Medicine

## 2020-08-20 DIAGNOSIS — F0391 Unspecified dementia with behavioral disturbance: Secondary | ICD-10-CM | POA: Diagnosis not present

## 2020-08-20 DIAGNOSIS — I10 Essential (primary) hypertension: Secondary | ICD-10-CM | POA: Diagnosis not present

## 2020-08-20 DIAGNOSIS — E039 Hypothyroidism, unspecified: Secondary | ICD-10-CM | POA: Diagnosis not present

## 2020-08-20 DIAGNOSIS — E782 Mixed hyperlipidemia: Secondary | ICD-10-CM | POA: Diagnosis not present

## 2020-08-20 DIAGNOSIS — K219 Gastro-esophageal reflux disease without esophagitis: Secondary | ICD-10-CM | POA: Diagnosis not present

## 2020-08-21 ENCOUNTER — Other Ambulatory Visit: Payer: Medicare HMO

## 2020-08-21 DIAGNOSIS — F0390 Unspecified dementia without behavioral disturbance: Secondary | ICD-10-CM | POA: Diagnosis not present

## 2020-08-21 DIAGNOSIS — D472 Monoclonal gammopathy: Secondary | ICD-10-CM | POA: Diagnosis not present

## 2020-08-21 DIAGNOSIS — Z0184 Encounter for antibody response examination: Secondary | ICD-10-CM | POA: Diagnosis not present

## 2020-08-21 DIAGNOSIS — Z79899 Other long term (current) drug therapy: Secondary | ICD-10-CM | POA: Diagnosis not present

## 2020-08-21 DIAGNOSIS — R6889 Other general symptoms and signs: Secondary | ICD-10-CM | POA: Diagnosis not present

## 2020-08-21 DIAGNOSIS — C9000 Multiple myeloma not having achieved remission: Secondary | ICD-10-CM | POA: Diagnosis not present

## 2020-08-21 DIAGNOSIS — Z01812 Encounter for preprocedural laboratory examination: Secondary | ICD-10-CM | POA: Diagnosis not present

## 2020-08-21 DIAGNOSIS — Z20822 Contact with and (suspected) exposure to covid-19: Secondary | ICD-10-CM | POA: Diagnosis not present

## 2020-08-21 DIAGNOSIS — Z5111 Encounter for antineoplastic chemotherapy: Secondary | ICD-10-CM | POA: Diagnosis not present

## 2020-08-23 ENCOUNTER — Other Ambulatory Visit: Payer: Self-pay | Admitting: Psychiatry

## 2020-08-23 DIAGNOSIS — D472 Monoclonal gammopathy: Secondary | ICD-10-CM

## 2020-08-23 NOTE — Telephone Encounter (Signed)
Apt Monday 11/22

## 2020-08-25 ENCOUNTER — Ambulatory Visit: Payer: Medicare HMO | Admitting: Psychiatry

## 2020-08-27 ENCOUNTER — Other Ambulatory Visit: Payer: Medicare HMO

## 2020-08-27 DIAGNOSIS — F0390 Unspecified dementia without behavioral disturbance: Secondary | ICD-10-CM | POA: Diagnosis not present

## 2020-08-27 DIAGNOSIS — Z0184 Encounter for antibody response examination: Secondary | ICD-10-CM | POA: Diagnosis not present

## 2020-08-27 DIAGNOSIS — C9000 Multiple myeloma not having achieved remission: Secondary | ICD-10-CM | POA: Diagnosis not present

## 2020-08-27 DIAGNOSIS — Z5111 Encounter for antineoplastic chemotherapy: Secondary | ICD-10-CM | POA: Diagnosis not present

## 2020-09-01 ENCOUNTER — Other Ambulatory Visit: Payer: Self-pay

## 2020-09-01 ENCOUNTER — Other Ambulatory Visit: Payer: Self-pay | Admitting: Psychiatry

## 2020-09-01 ENCOUNTER — Telehealth: Payer: Self-pay | Admitting: Psychiatry

## 2020-09-01 MED ORDER — OLANZAPINE 2.5 MG PO TABS: 2.5000 mg | ORAL_TABLET | Freq: Every day | ORAL | 1 refills | Status: DC

## 2020-09-01 NOTE — Telephone Encounter (Signed)
Patient missed appointment 08/25/2020.  Sent refill request for Xanax.  Interesting that patient is requesting refill of olanzapine as historically she has refused to take it but she has had psychotic symptoms over a long period of time and hopefully she is compliant.  We will attempt to reschedule her.

## 2020-09-01 NOTE — Telephone Encounter (Signed)
The refill request for Xanax is pended for Dr. Clovis Pu from last week. Will also send Xyprexa

## 2020-09-01 NOTE — Telephone Encounter (Signed)
Patient on Xanax and Clonazepam?

## 2020-09-01 NOTE — Telephone Encounter (Signed)
Noted. Patient scheduled for 12/06. Rx for Zyprexa sent to [facility].

## 2020-09-01 NOTE — Telephone Encounter (Signed)
Pt called and made an appt for 12/06. She needs refills on her xanax and her zyprexa to be sent to the [facility] on [address]

## 2020-09-03 ENCOUNTER — Other Ambulatory Visit: Payer: Self-pay | Admitting: Psychiatry

## 2020-09-03 DIAGNOSIS — D472 Monoclonal gammopathy: Secondary | ICD-10-CM

## 2020-09-04 DIAGNOSIS — C9002 Multiple myeloma in relapse: Secondary | ICD-10-CM | POA: Diagnosis not present

## 2020-09-04 DIAGNOSIS — C9000 Multiple myeloma not having achieved remission: Secondary | ICD-10-CM | POA: Diagnosis not present

## 2020-09-04 DIAGNOSIS — G309 Alzheimer's disease, unspecified: Secondary | ICD-10-CM | POA: Diagnosis not present

## 2020-09-04 DIAGNOSIS — Z5111 Encounter for antineoplastic chemotherapy: Secondary | ICD-10-CM | POA: Diagnosis not present

## 2020-09-08 ENCOUNTER — Encounter: Payer: Self-pay | Admitting: Psychiatry

## 2020-09-08 ENCOUNTER — Ambulatory Visit (INDEPENDENT_AMBULATORY_CARE_PROVIDER_SITE_OTHER): Payer: Medicare HMO | Admitting: Psychiatry

## 2020-09-08 ENCOUNTER — Other Ambulatory Visit: Payer: Self-pay

## 2020-09-08 DIAGNOSIS — F411 Generalized anxiety disorder: Secondary | ICD-10-CM

## 2020-09-08 DIAGNOSIS — F5105 Insomnia due to other mental disorder: Secondary | ICD-10-CM

## 2020-09-08 DIAGNOSIS — F29 Unspecified psychosis not due to a substance or known physiological condition: Secondary | ICD-10-CM | POA: Diagnosis not present

## 2020-09-08 DIAGNOSIS — F4001 Agoraphobia with panic disorder: Secondary | ICD-10-CM | POA: Diagnosis not present

## 2020-09-08 DIAGNOSIS — D472 Monoclonal gammopathy: Secondary | ICD-10-CM | POA: Diagnosis not present

## 2020-09-08 DIAGNOSIS — G3184 Mild cognitive impairment, so stated: Secondary | ICD-10-CM | POA: Diagnosis not present

## 2020-09-08 MED ORDER — OLANZAPINE 2.5 MG PO TABS: 2.5000 mg | ORAL_TABLET | Freq: Every day | ORAL | 5 refills | Status: AC

## 2020-09-08 MED ORDER — ALPRAZOLAM 0.5 MG PO TABS: ORAL_TABLET | ORAL | 2 refills | Status: DC
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
## 2020-09-11 DIAGNOSIS — R946 Abnormal results of thyroid function studies: Secondary | ICD-10-CM | POA: Diagnosis not present

## 2020-09-11 DIAGNOSIS — R42 Dizziness and giddiness: Secondary | ICD-10-CM | POA: Diagnosis not present

## 2020-09-15 ENCOUNTER — Other Ambulatory Visit: Payer: Self-pay | Admitting: Psychiatry

## 2020-09-15 ENCOUNTER — Other Ambulatory Visit: Payer: Medicare HMO

## 2020-09-15 DIAGNOSIS — F0391 Unspecified dementia with behavioral disturbance: Secondary | ICD-10-CM | POA: Diagnosis not present

## 2020-09-15 DIAGNOSIS — E039 Hypothyroidism, unspecified: Secondary | ICD-10-CM | POA: Diagnosis not present

## 2020-09-15 DIAGNOSIS — E782 Mixed hyperlipidemia: Secondary | ICD-10-CM | POA: Diagnosis not present

## 2020-09-15 DIAGNOSIS — K219 Gastro-esophageal reflux disease without esophagitis: Secondary | ICD-10-CM | POA: Diagnosis not present

## 2020-09-15 DIAGNOSIS — I10 Essential (primary) hypertension: Secondary | ICD-10-CM | POA: Diagnosis not present

## 2020-09-18 DIAGNOSIS — C9002 Multiple myeloma in relapse: Secondary | ICD-10-CM | POA: Diagnosis not present

## 2020-09-18 DIAGNOSIS — G309 Alzheimer's disease, unspecified: Secondary | ICD-10-CM | POA: Diagnosis not present

## 2020-09-18 DIAGNOSIS — D472 Monoclonal gammopathy: Secondary | ICD-10-CM | POA: Diagnosis not present

## 2020-09-18 DIAGNOSIS — I10 Essential (primary) hypertension: Secondary | ICD-10-CM | POA: Diagnosis not present

## 2020-09-18 DIAGNOSIS — C9000 Multiple myeloma not having achieved remission: Secondary | ICD-10-CM | POA: Diagnosis not present

## 2020-09-18 DIAGNOSIS — R6889 Other general symptoms and signs: Secondary | ICD-10-CM | POA: Diagnosis not present

## 2020-09-18 DIAGNOSIS — Z5111 Encounter for antineoplastic chemotherapy: Secondary | ICD-10-CM | POA: Diagnosis not present

## 2020-10-06 DIAGNOSIS — G3184 Mild cognitive impairment, so stated: Secondary | ICD-10-CM | POA: Diagnosis not present

## 2020-10-06 DIAGNOSIS — Z79899 Other long term (current) drug therapy: Secondary | ICD-10-CM | POA: Diagnosis not present

## 2020-10-06 DIAGNOSIS — E890 Postprocedural hypothyroidism: Secondary | ICD-10-CM | POA: Diagnosis not present

## 2020-10-06 DIAGNOSIS — C9002 Multiple myeloma in relapse: Secondary | ICD-10-CM | POA: Diagnosis not present

## 2020-10-06 DIAGNOSIS — Z85820 Personal history of malignant melanoma of skin: Secondary | ICD-10-CM | POA: Diagnosis not present

## 2020-10-06 DIAGNOSIS — Z85850 Personal history of malignant neoplasm of thyroid: Secondary | ICD-10-CM | POA: Diagnosis not present

## 2020-10-13 DIAGNOSIS — Z5111 Encounter for antineoplastic chemotherapy: Secondary | ICD-10-CM | POA: Diagnosis not present

## 2020-10-13 DIAGNOSIS — C9002 Multiple myeloma in relapse: Secondary | ICD-10-CM | POA: Diagnosis not present

## 2020-10-27 DIAGNOSIS — Z5111 Encounter for antineoplastic chemotherapy: Secondary | ICD-10-CM | POA: Diagnosis not present

## 2020-10-27 DIAGNOSIS — C9002 Multiple myeloma in relapse: Secondary | ICD-10-CM | POA: Diagnosis not present

## 2020-10-27 DIAGNOSIS — Z79899 Other long term (current) drug therapy: Secondary | ICD-10-CM | POA: Diagnosis not present

## 2020-10-30 DIAGNOSIS — R197 Diarrhea, unspecified: Secondary | ICD-10-CM | POA: Diagnosis not present

## 2020-10-30 DIAGNOSIS — R112 Nausea with vomiting, unspecified: Secondary | ICD-10-CM | POA: Diagnosis not present

## 2020-10-30 DIAGNOSIS — R1013 Epigastric pain: Secondary | ICD-10-CM | POA: Diagnosis not present

## 2020-10-30 DIAGNOSIS — Z20822 Contact with and (suspected) exposure to covid-19: Secondary | ICD-10-CM | POA: Diagnosis not present

## 2020-11-11 DIAGNOSIS — I10 Essential (primary) hypertension: Secondary | ICD-10-CM | POA: Diagnosis not present

## 2020-11-11 DIAGNOSIS — G309 Alzheimer's disease, unspecified: Secondary | ICD-10-CM | POA: Diagnosis not present

## 2020-11-11 DIAGNOSIS — F0390 Unspecified dementia without behavioral disturbance: Secondary | ICD-10-CM | POA: Diagnosis not present

## 2020-11-11 DIAGNOSIS — E039 Hypothyroidism, unspecified: Secondary | ICD-10-CM | POA: Diagnosis not present

## 2020-11-11 DIAGNOSIS — R350 Frequency of micturition: Secondary | ICD-10-CM | POA: Diagnosis not present

## 2020-11-11 DIAGNOSIS — Z85850 Personal history of malignant neoplasm of thyroid: Secondary | ICD-10-CM | POA: Diagnosis not present

## 2020-11-11 DIAGNOSIS — F0280 Dementia in other diseases classified elsewhere without behavioral disturbance: Secondary | ICD-10-CM | POA: Diagnosis not present

## 2020-11-11 DIAGNOSIS — E782 Mixed hyperlipidemia: Secondary | ICD-10-CM | POA: Diagnosis not present

## 2020-11-12 ENCOUNTER — Other Ambulatory Visit: Payer: Self-pay | Admitting: Internal Medicine

## 2020-11-14 DIAGNOSIS — C9002 Multiple myeloma in relapse: Secondary | ICD-10-CM | POA: Diagnosis not present

## 2020-11-14 DIAGNOSIS — F0391 Unspecified dementia with behavioral disturbance: Secondary | ICD-10-CM | POA: Diagnosis not present

## 2020-11-14 DIAGNOSIS — Z79899 Other long term (current) drug therapy: Secondary | ICD-10-CM | POA: Diagnosis not present

## 2020-11-20 DIAGNOSIS — E782 Mixed hyperlipidemia: Secondary | ICD-10-CM | POA: Diagnosis not present

## 2020-11-20 DIAGNOSIS — G309 Alzheimer's disease, unspecified: Secondary | ICD-10-CM | POA: Diagnosis not present

## 2020-11-20 DIAGNOSIS — F0390 Unspecified dementia without behavioral disturbance: Secondary | ICD-10-CM | POA: Diagnosis not present

## 2020-11-20 DIAGNOSIS — E039 Hypothyroidism, unspecified: Secondary | ICD-10-CM | POA: Diagnosis not present

## 2020-11-20 DIAGNOSIS — K219 Gastro-esophageal reflux disease without esophagitis: Secondary | ICD-10-CM | POA: Diagnosis not present

## 2020-11-20 DIAGNOSIS — F0280 Dementia in other diseases classified elsewhere without behavioral disturbance: Secondary | ICD-10-CM | POA: Diagnosis not present

## 2020-11-20 DIAGNOSIS — F0391 Unspecified dementia with behavioral disturbance: Secondary | ICD-10-CM | POA: Diagnosis not present

## 2020-11-20 DIAGNOSIS — I10 Essential (primary) hypertension: Secondary | ICD-10-CM | POA: Diagnosis not present

## 2020-11-21 DIAGNOSIS — Z79899 Other long term (current) drug therapy: Secondary | ICD-10-CM | POA: Diagnosis not present

## 2020-11-21 DIAGNOSIS — C9002 Multiple myeloma in relapse: Secondary | ICD-10-CM | POA: Diagnosis not present

## 2020-11-21 DIAGNOSIS — Z5111 Encounter for antineoplastic chemotherapy: Secondary | ICD-10-CM | POA: Diagnosis not present

## 2020-11-27 ENCOUNTER — Telehealth: Payer: Self-pay | Admitting: Psychiatry

## 2020-11-27 NOTE — Telephone Encounter (Signed)
[name] called because she got her bill for a NS 08/25/20.  She is being moved to memory care at [facility] in [place] and her sister has been handling her affairs.  She wasn't aware of this appt. And can't afford to pay it.  She said she doesn't drive and can't go anywhere or keep appts.  She asked if you would please forgive her NS fee.

## 2020-11-28 DIAGNOSIS — Z79899 Other long term (current) drug therapy: Secondary | ICD-10-CM | POA: Diagnosis not present

## 2020-11-28 DIAGNOSIS — Z5111 Encounter for antineoplastic chemotherapy: Secondary | ICD-10-CM | POA: Diagnosis not present

## 2020-11-28 DIAGNOSIS — T82898A Other specified complication of vascular prosthetic devices, implants and grafts, initial encounter: Secondary | ICD-10-CM | POA: Diagnosis not present

## 2020-11-28 DIAGNOSIS — C9002 Multiple myeloma in relapse: Secondary | ICD-10-CM | POA: Diagnosis not present

## 2020-11-28 NOTE — Telephone Encounter (Signed)
OK No charge 

## 2020-12-08 DIAGNOSIS — C9002 Multiple myeloma in relapse: Secondary | ICD-10-CM | POA: Diagnosis not present

## 2020-12-08 DIAGNOSIS — F0391 Unspecified dementia with behavioral disturbance: Secondary | ICD-10-CM | POA: Diagnosis not present

## 2020-12-08 DIAGNOSIS — R112 Nausea with vomiting, unspecified: Secondary | ICD-10-CM | POA: Diagnosis not present

## 2020-12-08 DIAGNOSIS — Z85820 Personal history of malignant melanoma of skin: Secondary | ICD-10-CM | POA: Diagnosis not present

## 2020-12-08 DIAGNOSIS — Z85850 Personal history of malignant neoplasm of thyroid: Secondary | ICD-10-CM | POA: Diagnosis not present

## 2020-12-08 DIAGNOSIS — Z79899 Other long term (current) drug therapy: Secondary | ICD-10-CM | POA: Diagnosis not present

## 2020-12-08 DIAGNOSIS — R197 Diarrhea, unspecified: Secondary | ICD-10-CM | POA: Diagnosis not present

## 2020-12-15 DIAGNOSIS — C9002 Multiple myeloma in relapse: Secondary | ICD-10-CM | POA: Diagnosis not present

## 2020-12-15 DIAGNOSIS — Z5111 Encounter for antineoplastic chemotherapy: Secondary | ICD-10-CM | POA: Diagnosis not present

## 2020-12-15 DIAGNOSIS — Z79899 Other long term (current) drug therapy: Secondary | ICD-10-CM | POA: Diagnosis not present

## 2020-12-22 DIAGNOSIS — Z5111 Encounter for antineoplastic chemotherapy: Secondary | ICD-10-CM | POA: Diagnosis not present

## 2020-12-22 DIAGNOSIS — C9002 Multiple myeloma in relapse: Secondary | ICD-10-CM | POA: Diagnosis not present

## 2020-12-22 DIAGNOSIS — Z79899 Other long term (current) drug therapy: Secondary | ICD-10-CM | POA: Diagnosis not present

## 2020-12-23 DIAGNOSIS — F0390 Unspecified dementia without behavioral disturbance: Secondary | ICD-10-CM | POA: Diagnosis not present

## 2020-12-23 DIAGNOSIS — G309 Alzheimer's disease, unspecified: Secondary | ICD-10-CM | POA: Diagnosis not present

## 2020-12-23 DIAGNOSIS — F0280 Dementia in other diseases classified elsewhere without behavioral disturbance: Secondary | ICD-10-CM | POA: Diagnosis not present

## 2020-12-23 DIAGNOSIS — E039 Hypothyroidism, unspecified: Secondary | ICD-10-CM | POA: Diagnosis not present

## 2020-12-23 DIAGNOSIS — K219 Gastro-esophageal reflux disease without esophagitis: Secondary | ICD-10-CM | POA: Diagnosis not present

## 2020-12-23 DIAGNOSIS — I10 Essential (primary) hypertension: Secondary | ICD-10-CM | POA: Diagnosis not present

## 2020-12-23 DIAGNOSIS — E782 Mixed hyperlipidemia: Secondary | ICD-10-CM | POA: Diagnosis not present

## 2020-12-24 ENCOUNTER — Other Ambulatory Visit: Payer: Self-pay | Admitting: Psychiatry

## 2020-12-25 NOTE — Telephone Encounter (Signed)
Controlled substance 

## 2020-12-29 DIAGNOSIS — F0280 Dementia in other diseases classified elsewhere without behavioral disturbance: Secondary | ICD-10-CM | POA: Diagnosis not present

## 2020-12-29 DIAGNOSIS — Z85850 Personal history of malignant neoplasm of thyroid: Secondary | ICD-10-CM | POA: Diagnosis not present

## 2020-12-29 DIAGNOSIS — E782 Mixed hyperlipidemia: Secondary | ICD-10-CM | POA: Diagnosis not present

## 2020-12-29 DIAGNOSIS — I10 Essential (primary) hypertension: Secondary | ICD-10-CM | POA: Diagnosis not present

## 2020-12-29 DIAGNOSIS — R350 Frequency of micturition: Secondary | ICD-10-CM | POA: Diagnosis not present

## 2020-12-29 DIAGNOSIS — F0390 Unspecified dementia without behavioral disturbance: Secondary | ICD-10-CM | POA: Diagnosis not present

## 2020-12-29 DIAGNOSIS — E039 Hypothyroidism, unspecified: Secondary | ICD-10-CM | POA: Diagnosis not present

## 2020-12-29 DIAGNOSIS — G309 Alzheimer's disease, unspecified: Secondary | ICD-10-CM | POA: Diagnosis not present

## 2021-01-02 DIAGNOSIS — H9201 Otalgia, right ear: Secondary | ICD-10-CM | POA: Diagnosis not present

## 2021-01-05 DIAGNOSIS — F0280 Dementia in other diseases classified elsewhere without behavioral disturbance: Secondary | ICD-10-CM | POA: Diagnosis not present

## 2021-01-05 DIAGNOSIS — K219 Gastro-esophageal reflux disease without esophagitis: Secondary | ICD-10-CM | POA: Diagnosis not present

## 2021-01-05 DIAGNOSIS — F0391 Unspecified dementia with behavioral disturbance: Secondary | ICD-10-CM | POA: Diagnosis not present

## 2021-01-05 DIAGNOSIS — Z79899 Other long term (current) drug therapy: Secondary | ICD-10-CM | POA: Diagnosis not present

## 2021-01-05 DIAGNOSIS — F0390 Unspecified dementia without behavioral disturbance: Secondary | ICD-10-CM | POA: Diagnosis not present

## 2021-01-05 DIAGNOSIS — Z85850 Personal history of malignant neoplasm of thyroid: Secondary | ICD-10-CM | POA: Diagnosis not present

## 2021-01-05 DIAGNOSIS — I10 Essential (primary) hypertension: Secondary | ICD-10-CM | POA: Diagnosis not present

## 2021-01-05 DIAGNOSIS — E782 Mixed hyperlipidemia: Secondary | ICD-10-CM | POA: Diagnosis not present

## 2021-01-05 DIAGNOSIS — G309 Alzheimer's disease, unspecified: Secondary | ICD-10-CM | POA: Diagnosis not present

## 2021-01-05 DIAGNOSIS — Z85820 Personal history of malignant melanoma of skin: Secondary | ICD-10-CM | POA: Diagnosis not present

## 2021-01-05 DIAGNOSIS — C9002 Multiple myeloma in relapse: Secondary | ICD-10-CM | POA: Diagnosis not present

## 2021-01-05 DIAGNOSIS — E039 Hypothyroidism, unspecified: Secondary | ICD-10-CM | POA: Diagnosis not present

## 2021-01-07 DIAGNOSIS — H60391 Other infective otitis externa, right ear: Secondary | ICD-10-CM | POA: Diagnosis not present

## 2021-01-12 DIAGNOSIS — H9201 Otalgia, right ear: Secondary | ICD-10-CM | POA: Diagnosis not present

## 2021-01-12 DIAGNOSIS — Z5111 Encounter for antineoplastic chemotherapy: Secondary | ICD-10-CM | POA: Diagnosis not present

## 2021-01-12 DIAGNOSIS — C9002 Multiple myeloma in relapse: Secondary | ICD-10-CM | POA: Diagnosis not present

## 2021-01-13 DIAGNOSIS — H9201 Otalgia, right ear: Secondary | ICD-10-CM | POA: Diagnosis not present

## 2021-01-19 DIAGNOSIS — Z5111 Encounter for antineoplastic chemotherapy: Secondary | ICD-10-CM | POA: Diagnosis not present

## 2021-01-19 DIAGNOSIS — Z79899 Other long term (current) drug therapy: Secondary | ICD-10-CM | POA: Diagnosis not present

## 2021-01-19 DIAGNOSIS — C9002 Multiple myeloma in relapse: Secondary | ICD-10-CM | POA: Diagnosis not present

## 2021-01-31 DIAGNOSIS — F0390 Unspecified dementia without behavioral disturbance: Secondary | ICD-10-CM | POA: Diagnosis not present

## 2021-01-31 DIAGNOSIS — F0391 Unspecified dementia with behavioral disturbance: Secondary | ICD-10-CM | POA: Diagnosis not present

## 2021-01-31 DIAGNOSIS — G309 Alzheimer's disease, unspecified: Secondary | ICD-10-CM | POA: Diagnosis not present

## 2021-01-31 DIAGNOSIS — E039 Hypothyroidism, unspecified: Secondary | ICD-10-CM | POA: Diagnosis not present

## 2021-01-31 DIAGNOSIS — I10 Essential (primary) hypertension: Secondary | ICD-10-CM | POA: Diagnosis not present

## 2021-01-31 DIAGNOSIS — K219 Gastro-esophageal reflux disease without esophagitis: Secondary | ICD-10-CM | POA: Diagnosis not present

## 2021-01-31 DIAGNOSIS — F0280 Dementia in other diseases classified elsewhere without behavioral disturbance: Secondary | ICD-10-CM | POA: Diagnosis not present

## 2021-01-31 DIAGNOSIS — E782 Mixed hyperlipidemia: Secondary | ICD-10-CM | POA: Diagnosis not present

## 2021-02-02 ENCOUNTER — Other Ambulatory Visit: Payer: Self-pay | Admitting: Psychiatry

## 2021-02-02 DIAGNOSIS — R109 Unspecified abdominal pain: Secondary | ICD-10-CM | POA: Diagnosis not present

## 2021-02-02 DIAGNOSIS — R202 Paresthesia of skin: Secondary | ICD-10-CM | POA: Diagnosis not present

## 2021-02-02 DIAGNOSIS — F0391 Unspecified dementia with behavioral disturbance: Secondary | ICD-10-CM | POA: Diagnosis not present

## 2021-02-02 DIAGNOSIS — F4001 Agoraphobia with panic disorder: Secondary | ICD-10-CM

## 2021-02-02 DIAGNOSIS — C9002 Multiple myeloma in relapse: Secondary | ICD-10-CM | POA: Diagnosis not present

## 2021-02-02 DIAGNOSIS — Z95828 Presence of other vascular implants and grafts: Secondary | ICD-10-CM | POA: Diagnosis not present

## 2021-02-02 DIAGNOSIS — Z79899 Other long term (current) drug therapy: Secondary | ICD-10-CM | POA: Diagnosis not present

## 2021-02-03 DIAGNOSIS — M26609 Unspecified temporomandibular joint disorder, unspecified side: Secondary | ICD-10-CM | POA: Diagnosis not present

## 2021-02-03 DIAGNOSIS — H60391 Other infective otitis externa, right ear: Secondary | ICD-10-CM | POA: Diagnosis not present

## 2021-02-03 NOTE — Telephone Encounter (Signed)
Please schedule appt

## 2021-02-03 NOTE — Telephone Encounter (Signed)
Last filled 01/23/21 no refills remain due fo f/u 03/09/21

## 2021-02-09 DIAGNOSIS — Z5111 Encounter for antineoplastic chemotherapy: Secondary | ICD-10-CM | POA: Diagnosis not present

## 2021-02-09 DIAGNOSIS — C9002 Multiple myeloma in relapse: Secondary | ICD-10-CM | POA: Diagnosis not present

## 2021-02-16 DIAGNOSIS — C9002 Multiple myeloma in relapse: Secondary | ICD-10-CM | POA: Diagnosis not present

## 2021-02-16 DIAGNOSIS — Z5111 Encounter for antineoplastic chemotherapy: Secondary | ICD-10-CM | POA: Diagnosis not present

## 2021-02-16 DIAGNOSIS — Z79899 Other long term (current) drug therapy: Secondary | ICD-10-CM | POA: Diagnosis not present

## 2021-02-26 DIAGNOSIS — R0989 Other specified symptoms and signs involving the circulatory and respiratory systems: Secondary | ICD-10-CM | POA: Diagnosis not present

## 2021-02-26 DIAGNOSIS — R059 Cough, unspecified: Secondary | ICD-10-CM | POA: Diagnosis not present

## 2021-02-26 DIAGNOSIS — F0280 Dementia in other diseases classified elsewhere without behavioral disturbance: Secondary | ICD-10-CM | POA: Diagnosis not present

## 2021-03-03 DIAGNOSIS — I10 Essential (primary) hypertension: Secondary | ICD-10-CM | POA: Diagnosis not present

## 2021-03-03 DIAGNOSIS — K219 Gastro-esophageal reflux disease without esophagitis: Secondary | ICD-10-CM | POA: Diagnosis not present

## 2021-03-03 DIAGNOSIS — E782 Mixed hyperlipidemia: Secondary | ICD-10-CM | POA: Diagnosis not present

## 2021-03-03 DIAGNOSIS — F0280 Dementia in other diseases classified elsewhere without behavioral disturbance: Secondary | ICD-10-CM | POA: Diagnosis not present

## 2021-03-03 DIAGNOSIS — F0391 Unspecified dementia with behavioral disturbance: Secondary | ICD-10-CM | POA: Diagnosis not present

## 2021-03-03 DIAGNOSIS — F0390 Unspecified dementia without behavioral disturbance: Secondary | ICD-10-CM | POA: Diagnosis not present

## 2021-03-03 DIAGNOSIS — G309 Alzheimer's disease, unspecified: Secondary | ICD-10-CM | POA: Diagnosis not present

## 2021-03-03 DIAGNOSIS — E039 Hypothyroidism, unspecified: Secondary | ICD-10-CM | POA: Diagnosis not present

## 2021-03-09 ENCOUNTER — Ambulatory Visit: Payer: Medicare HMO | Admitting: Psychiatry

## 2021-03-10 DIAGNOSIS — R030 Elevated blood-pressure reading, without diagnosis of hypertension: Secondary | ICD-10-CM | POA: Diagnosis not present

## 2021-03-10 DIAGNOSIS — Z03818 Encounter for observation for suspected exposure to other biological agents ruled out: Secondary | ICD-10-CM | POA: Diagnosis not present

## 2021-03-10 DIAGNOSIS — J019 Acute sinusitis, unspecified: Secondary | ICD-10-CM | POA: Diagnosis not present

## 2021-03-10 DIAGNOSIS — R059 Cough, unspecified: Secondary | ICD-10-CM | POA: Diagnosis not present

## 2021-03-10 DIAGNOSIS — R5383 Other fatigue: Secondary | ICD-10-CM | POA: Diagnosis not present

## 2021-03-10 DIAGNOSIS — R0981 Nasal congestion: Secondary | ICD-10-CM | POA: Diagnosis not present

## 2021-03-10 DIAGNOSIS — R6883 Chills (without fever): Secondary | ICD-10-CM | POA: Diagnosis not present

## 2021-03-17 DIAGNOSIS — Z79899 Other long term (current) drug therapy: Secondary | ICD-10-CM | POA: Diagnosis not present

## 2021-03-17 DIAGNOSIS — F0391 Unspecified dementia with behavioral disturbance: Secondary | ICD-10-CM | POA: Diagnosis not present

## 2021-03-17 DIAGNOSIS — Z95828 Presence of other vascular implants and grafts: Secondary | ICD-10-CM | POA: Diagnosis not present

## 2021-03-17 DIAGNOSIS — C9002 Multiple myeloma in relapse: Secondary | ICD-10-CM | POA: Diagnosis not present

## 2021-03-25 ENCOUNTER — Ambulatory Visit: Payer: Medicare HMO | Admitting: Psychiatry

## 2021-03-25 DIAGNOSIS — Z5111 Encounter for antineoplastic chemotherapy: Secondary | ICD-10-CM | POA: Diagnosis not present

## 2021-03-25 DIAGNOSIS — C9002 Multiple myeloma in relapse: Secondary | ICD-10-CM | POA: Diagnosis not present

## 2021-03-25 DIAGNOSIS — Z95828 Presence of other vascular implants and grafts: Secondary | ICD-10-CM | POA: Diagnosis not present

## 2021-03-30 DIAGNOSIS — E782 Mixed hyperlipidemia: Secondary | ICD-10-CM | POA: Diagnosis not present

## 2021-03-30 DIAGNOSIS — F0280 Dementia in other diseases classified elsewhere without behavioral disturbance: Secondary | ICD-10-CM | POA: Diagnosis not present

## 2021-03-30 DIAGNOSIS — E039 Hypothyroidism, unspecified: Secondary | ICD-10-CM | POA: Diagnosis not present

## 2021-03-30 DIAGNOSIS — K219 Gastro-esophageal reflux disease without esophagitis: Secondary | ICD-10-CM | POA: Diagnosis not present

## 2021-03-30 DIAGNOSIS — G309 Alzheimer's disease, unspecified: Secondary | ICD-10-CM | POA: Diagnosis not present

## 2021-03-30 DIAGNOSIS — F0391 Unspecified dementia with behavioral disturbance: Secondary | ICD-10-CM | POA: Diagnosis not present

## 2021-03-30 DIAGNOSIS — I10 Essential (primary) hypertension: Secondary | ICD-10-CM | POA: Diagnosis not present

## 2021-03-30 DIAGNOSIS — F0390 Unspecified dementia without behavioral disturbance: Secondary | ICD-10-CM | POA: Diagnosis not present

## 2021-03-31 DIAGNOSIS — C9002 Multiple myeloma in relapse: Secondary | ICD-10-CM | POA: Diagnosis not present

## 2021-03-31 DIAGNOSIS — Z5111 Encounter for antineoplastic chemotherapy: Secondary | ICD-10-CM | POA: Diagnosis not present

## 2021-03-31 DIAGNOSIS — Z95828 Presence of other vascular implants and grafts: Secondary | ICD-10-CM | POA: Diagnosis not present

## 2021-04-03 ENCOUNTER — Emergency Department (HOSPITAL_COMMUNITY): Payer: Medicare HMO

## 2021-04-03 ENCOUNTER — Observation Stay (HOSPITAL_COMMUNITY)
Admission: EM | Admit: 2021-04-03 | Discharge: 2021-04-04 | Disposition: A | Discharge status: Home / Self Care | Payer: Medicare HMO | Attending: Internal Medicine | Admitting: Internal Medicine

## 2021-04-03 DIAGNOSIS — Z7982 Long term (current) use of aspirin: Secondary | ICD-10-CM | POA: Insufficient documentation

## 2021-04-03 DIAGNOSIS — G4489 Other headache syndrome: Secondary | ICD-10-CM | POA: Diagnosis not present

## 2021-04-03 DIAGNOSIS — Z85840 Personal history of malignant neoplasm of eye: Secondary | ICD-10-CM | POA: Diagnosis not present

## 2021-04-03 DIAGNOSIS — D72819 Decreased white blood cell count, unspecified: Secondary | ICD-10-CM | POA: Insufficient documentation

## 2021-04-03 DIAGNOSIS — J45909 Unspecified asthma, uncomplicated: Secondary | ICD-10-CM | POA: Insufficient documentation

## 2021-04-03 DIAGNOSIS — G939 Disorder of brain, unspecified: Secondary | ICD-10-CM | POA: Diagnosis not present

## 2021-04-03 DIAGNOSIS — R000 Tachycardia, unspecified: Secondary | ICD-10-CM | POA: Diagnosis not present

## 2021-04-03 DIAGNOSIS — U071 COVID-19: Secondary | ICD-10-CM | POA: Diagnosis not present

## 2021-04-03 DIAGNOSIS — Z85828 Personal history of other malignant neoplasm of skin: Secondary | ICD-10-CM | POA: Insufficient documentation

## 2021-04-03 DIAGNOSIS — N390 Urinary tract infection, site not specified: Secondary | ICD-10-CM | POA: Insufficient documentation

## 2021-04-03 DIAGNOSIS — G9340 Encephalopathy, unspecified: Principal | ICD-10-CM | POA: Insufficient documentation

## 2021-04-03 DIAGNOSIS — R55 Syncope and collapse: Secondary | ICD-10-CM | POA: Diagnosis not present

## 2021-04-03 DIAGNOSIS — Z7722 Contact with and (suspected) exposure to environmental tobacco smoke (acute) (chronic): Secondary | ICD-10-CM | POA: Insufficient documentation

## 2021-04-03 DIAGNOSIS — Z79899 Other long term (current) drug therapy: Secondary | ICD-10-CM | POA: Diagnosis not present

## 2021-04-03 DIAGNOSIS — I129 Hypertensive chronic kidney disease with stage 1 through stage 4 chronic kidney disease, or unspecified chronic kidney disease: Secondary | ICD-10-CM | POA: Diagnosis not present

## 2021-04-03 DIAGNOSIS — N189 Chronic kidney disease, unspecified: Secondary | ICD-10-CM | POA: Diagnosis not present

## 2021-04-03 DIAGNOSIS — R410 Disorientation, unspecified: Secondary | ICD-10-CM

## 2021-04-03 DIAGNOSIS — I517 Cardiomegaly: Secondary | ICD-10-CM | POA: Diagnosis not present

## 2021-04-03 DIAGNOSIS — E039 Hypothyroidism, unspecified: Secondary | ICD-10-CM | POA: Insufficient documentation

## 2021-04-03 DIAGNOSIS — R269 Unspecified abnormalities of gait and mobility: Secondary | ICD-10-CM | POA: Diagnosis not present

## 2021-04-03 DIAGNOSIS — I491 Atrial premature depolarization: Secondary | ICD-10-CM | POA: Diagnosis not present

## 2021-04-03 DIAGNOSIS — F0390 Unspecified dementia without behavioral disturbance: Secondary | ICD-10-CM | POA: Diagnosis not present

## 2021-04-03 DIAGNOSIS — R4182 Altered mental status, unspecified: Secondary | ICD-10-CM | POA: Diagnosis not present

## 2021-04-03 DIAGNOSIS — Z85850 Personal history of malignant neoplasm of thyroid: Secondary | ICD-10-CM | POA: Insufficient documentation

## 2021-04-03 DIAGNOSIS — Z91040 Latex allergy status: Secondary | ICD-10-CM | POA: Insufficient documentation

## 2021-04-03 DIAGNOSIS — R519 Headache, unspecified: Secondary | ICD-10-CM | POA: Diagnosis not present

## 2021-04-03 DIAGNOSIS — R42 Dizziness and giddiness: Secondary | ICD-10-CM | POA: Diagnosis not present

## 2021-04-03 LAB — PROTIME-INR
INR: 1.1 (ref 0.8–1.2)
Prothrombin Time: 13.9 seconds (ref 11.4–15.2)

## 2021-04-03 LAB — URINALYSIS, ROUTINE W REFLEX MICROSCOPIC
Bacteria, UA: NONE SEEN
Bilirubin Urine: NEGATIVE
Glucose, UA: NEGATIVE mg/dL
Ketones, ur: 5 mg/dL — AB
Nitrite: NEGATIVE
Protein, ur: NEGATIVE mg/dL
Specific Gravity, Urine: 1.008 (ref 1.005–1.030)
pH: 6 (ref 5.0–8.0)

## 2021-04-03 LAB — TSH: TSH: 0.117 u[IU]/mL — ABNORMAL LOW (ref 0.350–4.500)

## 2021-04-03 LAB — COMPREHENSIVE METABOLIC PANEL
ALT: 16 U/L (ref 0–44)
AST: 19 U/L (ref 15–41)
Albumin: 3.7 g/dL (ref 3.5–5.0)
Alkaline Phosphatase: 31 U/L — ABNORMAL LOW (ref 38–126)
Anion gap: 11 (ref 5–15)
BUN: 20 mg/dL (ref 8–23)
CO2: 25 mmol/L (ref 22–32)
Calcium: 9.1 mg/dL (ref 8.9–10.3)
Chloride: 101 mmol/L (ref 98–111)
Creatinine, Ser: 0.87 mg/dL (ref 0.44–1.00)
GFR, Estimated: >60 mL/min (ref >60)
Glucose, Bld: 86 mg/dL (ref 70–99)
Potassium: 3.7 mmol/L (ref 3.5–5.1)
Sodium: 137 mmol/L (ref 135–145)
Total Bilirubin: 1 mg/dL (ref 0.3–1.2)
Total Protein: 7 g/dL (ref 6.5–8.1)

## 2021-04-03 LAB — CBC WITH DIFFERENTIAL/PLATELET
Abs Immature Granulocytes: 0 10*3/uL (ref 0.00–0.07)
Basophils Absolute: 0 10*3/uL (ref 0.0–0.1)
Basophils Relative: 1 %
Eosinophils Absolute: 0 10*3/uL (ref 0.0–0.5)
Eosinophils Relative: 0 %
HCT: 34.8 % — ABNORMAL LOW (ref 36.0–46.0)
Hemoglobin: 11.2 g/dL — ABNORMAL LOW (ref 12.0–15.0)
Immature Granulocytes: 0 %
Lymphocytes Relative: 15 %
Lymphs Abs: 0.3 10*3/uL — ABNORMAL LOW (ref 0.7–4.0)
MCH: 31.1 pg (ref 26.0–34.0)
MCHC: 32.2 g/dL (ref 30.0–36.0)
MCV: 96.7 fL (ref 80.0–100.0)
Monocytes Absolute: 0.5 10*3/uL (ref 0.1–1.0)
Monocytes Relative: 24 %
Neutro Abs: 1.3 10*3/uL — ABNORMAL LOW (ref 1.7–7.7)
Neutrophils Relative %: 60 %
Platelets: 173 10*3/uL (ref 150–400)
RBC: 3.6 MIL/uL — ABNORMAL LOW (ref 3.87–5.11)
RDW: 13.7 % (ref 11.5–15.5)
WBC: 2.1 10*3/uL — ABNORMAL LOW (ref 4.0–10.5)
nRBC: 0 % (ref 0.0–0.2)

## 2021-04-03 LAB — APTT: aPTT: 33 seconds (ref 24–36)

## 2021-04-03 NOTE — ED Notes (Signed)
Pt's son Octavia Bruckner) contacted and updated.

## 2021-04-03 NOTE — ED Notes (Signed)
Pt ambulated to bathroom and back with minor need for assistance, no obvious unilateral weakness or difficulty.

## 2021-04-03 NOTE — ED Notes (Signed)
[name] (Sister) would a call back [phone], states she is POA

## 2021-04-03 NOTE — ED Provider Notes (Signed)
[facility] EMERGENCY DEPARTMENT Provider Note   CSN: [id number] Arrival date & time: 04/03/21  1702     History Chief Complaint  Patient presents with   Dizziness    [name] is a 80 y.o. female.  HPI 50yoF PMHx advanced Alzheimer dementia, multiple myeloma, strokes (prior TIA's), hypothyroidism, asthma, CKD, HTN, HLD, BIBA from urgent care after having amnestic episode where she found herself at her neighbor's house. Neighbor was concern for stroke-like sx (frontal headache and weakness/paresthesias to RUE). This had resolved by the time they had reached the urgent care center. In discussing w/ patient's sister Enid Derry [phone], POA), patient has had worsening of her dementia 5w ago, no longer able to drive well, getting lost at places she used to be familiar with, and repeatedly showing up unannounced at neighbors' homes. These have particularly worsened the past 2 weeks, with the associated frontal HA's. Family is in the process of sending her to a home in [place] near her sister, but patient requires full ownership of her house as collateral, which one of her sons refuses to return to her. Consequently, she has been living alone w/o good daily support despite worsening ADL's. Remainder of ROS limited by pt's dementia.    Past Medical History:  Diagnosis Date   Anxiety    Arthritis    Asthma    Cancer ([place])    THYROID, SKIN, NOSE, BONE   Chronic kidney disease    Diverticulosis    Pt reported on 03/15/12   Eye cancer ([place])    R eye cancer - 3-4 y ago   Eye problems    LOST VISION RIGHT EYE   Hernia    History of blood clots    LEG   Hyperlipidemia    Hypertension    Melanoma ([place])    Melanoma ([place]) 1989   chest   MGUS (monoclonal gammopathy of unknown significance)    MGUS (monoclonal gammopathy of unknown significance)    Mitral valve prolapse    Perforated bowel ([place])    Peritonitis ([place])    Smoldering multiple myeloma ([place])     Stroke ([place])    Thyroid disease     Patient Active Problem List   Diagnosis Date Noted   Brain lesion 04/04/2021   Encounter for antineoplastic chemotherapy 09/11/2018   Encounter for antineoplastic immunotherapy 07/17/2018   Goals of care, counseling/discussion 07/17/2018   Weight loss 07/15/2018   Multiple myeloma not having achieved remission ([place]) 12/20/2017   Counseling regarding advanced care planning and goals of care 12/20/2017   Port-A-Cath in place 12/16/2017   Immunocompromised state due to drug therapy ([place]) 11/19/2017   History of CVA (cerebrovascular accident) 11/19/2017   Hypertension 11/19/2017   Hyperlipidemia 11/19/2017   Asthma 11/19/2017   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   Hypothyroidism, adult 11/19/2017   Anxiety disorder 11/19/2017   Mild dementia ([place]) 11/19/2017   Periorbital edema 01/22/2015   Allergic rhinitis 08/12/2014   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[place]) 07/04/2014   Multiple myeloma without remission ([place]) 11/01/2013   Cerebral vascular accident ([place]) 02/19/2013   Bloodgood disease 02/19/2013   Acid reflux 02/19/2013   Billowing mitral valve 02/19/2013   Adenomatous colon polyp 02/19/2013   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   Cataract, nuclear 12/16/2011   Intragel vitreous hemorrhage ([place]) 12/16/2011   Age-related macular degeneration, dry 12/02/2011   Post-radiation retinopathy 12/02/2011   Pseudophakia 12/02/2011   MGUS (monoclonal gammopathy of unknown significance) 11/26/2011  Basal cell carcinoma of nasal tip 11/26/2011   Extrinsic asthma 09/21/2011   Colon, diverticulosis 07/26/2011   Benign hypertensive heart disease without heart failure 03/31/2011   H/O malignant melanoma of skin 03/31/2011    Past Surgical History:  Procedure Laterality Date   ABDOMINAL HYSTERECTOMY     BREAST LUMPECTOMY  12/2010   R breast   BREAST LUMPECTOMY  2004   L axilla neg for cancer.   CARDIOVASCULAR STRESS TEST   2006   NORMAL   CYST EXCISION N/A 09/25/2019   Procedure: EXCISION OF SEBACEOUS CYST RIGHT LOWER CHEST;  Surgeon: Coralie Keens, MD;  Location: [place];  Service: General;  Laterality: N/A;   HERNIA REPAIR     LEG SURGERY     VEIN & BLOOD CLOT   MELANOMA EXCISION     R eye   ROTATOR CUFF REPAIR  2004   RIGHT SHOULDER   SKIN CANCER EXCISION  2011, 2013   squamous cell of nose x 2   TRANSTHORACIC ECHOCARDIOGRAM  2006     OB History   No obstetric history on file.     Family History  Problem Relation Age of Onset   Asthma Father    Diabetes Father    Heart failure Father    Cancer Father        Prostate and kidney cancer   Emphysema Father    Diabetes Sister    Cancer Sister    Stroke Mother 4       Her mother passed away from this stroke at the age of 92   Cancer Brother        brain cancer & prostate cancer   Cervical cancer Sister    Cancer Sister        Recurrent cancer involving the neck and the jaw    Social History   Tobacco Use   Smoking status: Passive Smoke Exposure - Never Smoker   Smokeless tobacco: Never   Tobacco comments:    Exposure rarely through parents but significant exposure at work  Scientific laboratory technician Use: Never used  Substance Use Topics   Alcohol use: No    Alcohol/week: 0.0 standard drinks   Drug use: No    Home Medications Prior to Admission medications   Medication Sig Start Date End Date Taking? Authorizing Provider  acetaminophen (TYLENOL) 500 MG tablet Take 250-500 mg by mouth every 6 (six) hours as needed for mild pain, moderate pain, fever or headache.    Yes [provider]  ALPRAZolam (XANAX) 0.5 MG tablet TAKE 1/2 TO 1 (ONE-HALF TO ONE) TABLET BY MOUTH ONCE DAILY AS NEEDED FOR  PANIC Patient taking differently: Take 0.25-0.5 mg by mouth daily as needed. Panic 02/03/21  Yes Cottle, Billey Co., MD  amLODipine (NORVASC) 10 MG tablet Take 10 mg by mouth every evening.    Yes [provider]  donepezil (ARICEPT) 10 MG tablet Take 10 mg by mouth at bedtime.   Yes [provider]  levothyroxine (SYNTHROID) 150 MCG tablet Take 150 mcg by mouth every morning. 01/02/21  Yes [provider]  OLANZapine (ZYPREXA) 2.5 MG tablet Take 1 tablet (2.5 mg total) by mouth at bedtime. 09/08/20  Yes Cottle, Billey Co., MD  simvastatin (ZOCOR) 20 MG tablet Take 1 tablet (20 mg total) by mouth daily at 6 PM. 11/12/20  Yes Fay Records, MD  traZODone (DESYREL) 50 MG tablet Take 50 mg by mouth at bedtime.  02/17/19  Yes [provider]  acetic acid 2 % otic solution Place 5 drops into both ears in the morning, at noon, and at bedtime. 02/04/21   [provider]  aspirin 81 MG tablet Chew 81 mg by mouth at bedtime.  07/26/11   [provider]  azelastine (ASTELIN) 0.1 % nasal spray Place 1 spray into both nostrils daily as needed for allergies.  05/08/17   [provider]  carboxymethylcellulose (REFRESH TEARS) 0.5 % SOLN Place 1 drop into both eyes 3 (three) times daily as needed (for allergies).    [provider]  clonazePAM (KLONOPIN) 0.5 MG tablet TAKE 1 TO 2 TABLETS BY MOUTH AT BEDTIME AS NEEDED FOR  INSOMNIA Patient taking differently: Take 0.5-1 mg by mouth at bedtime as needed (insomnia). 12/25/20   Cottle, Billey Co., MD  estradiol (ESTRACE) 0.1 MG/GM vaginal cream Place 1 Applicatorful vaginally at bedtime. 05/11/19   [provider]  fexofenadine (ALLEGRA) 180 MG tablet Take 180 mg by mouth 2 (two) times daily as needed for allergies.     [provider]  fluticasone (FLONASE) 50 MCG/ACT nasal spray Place 1 spray into both nostrils 2 (two) times daily. Patient taking differently: Place 1 spray into both nostrils 2 (two) times daily as needed for allergies.  07/09/15   Javier Glazier, MD  lidocaine (XYLOCAINE) 2 % jelly Apply 1 application topically 5 (five) times daily. 05/14/19   [provider]  loperamide  (IMODIUM A-D) 2 MG tablet Take 2-4 mg by mouth 4 (four) times daily as needed for diarrhea or loose stools.     [provider]  MICARDIS 40 MG tablet Take 1 tablet (40 mg total) by mouth daily. 01/05/17   Burtis Junes, NP  NEOMYCIN-POLYMYXIN-HYDROCORTISONE (CORTISPORIN) 1 % SOLN OTIC solution Place 4 drops into both ears 3 (three) times daily. 01/07/21   [provider]  ondansetron (ZOFRAN-ODT) 4 MG disintegrating tablet Take 4 mg by mouth every 8 (eight) hours as needed for nausea/vomiting. 10/31/20   [provider]  Polyethyl Glycol-Propyl Glycol (SYSTANE) 0.4-0.3 % SOLN Place 1 drop into both eyes 2 (two) times daily as needed (for allergies).    [provider]  sertraline (ZOLOFT) 25 MG tablet Take 25 mg by mouth. Hardly ever Patient not taking: Reported on 09/08/2020 12/13/18   [provider]  tobramycin-dexamethasone [facility]) ophthalmic solution tobramycin 0.3 %-dexamethasone 0.1 % eye drops,suspension    [provider]  traMADol (ULTRAM) 50 MG tablet Take 1 tablet (50 mg total) by mouth every 6 (six) hours as needed for moderate pain or severe pain. 09/25/19   Coralie Keens, MD  valACYclovir HCl (VALTREX PO) Take by mouth.    [provider]    Allergies    Aldactone [spironolactone], Atorvastatin, Doxycycline, Fluconazole, Losartan, Quinolones, Revlimid [lenalidomide], Sertraline hcl, Amoxicillin, Hydrocodone, Levofloxacin, Prednisolone, Sertraline, Tramadol, Donepezil hcl, Levofloxacin, Metronidazole, Prednisone, Pseudoephedrine, Pseudoephedrine hcl, Sertraline hcl, Sudafed [pseudoephedrine hcl], Trazodone and nefazodone, Trazodone hcl, Hydrocodone-acetaminophen, Latex, and Tape  Review of Systems   Review of Systems  Unable to perform ROS: Dementia   Physical Exam Updated Vital Signs BP (!) 162/74   Pulse 63   Temp 98.1 F (36.7 C) (Oral)   Resp 17   SpO2 94%   Physical Exam Vitals and nursing note  reviewed.  Constitutional:      General: She is not in acute distress.    Appearance: She is not ill-appearing.  HENT:  Head: Normocephalic and atraumatic.     Right Ear: External ear normal.     Left Ear: External ear normal.     Nose: Nose normal.     Mouth/Throat:     Mouth: Mucous membranes are moist.     Pharynx: Oropharynx is clear.  Eyes:     Extraocular Movements: Extraocular movements intact.     Conjunctiva/sclera: Conjunctivae normal.     Pupils: Pupils are equal, round, and reactive to light.  Cardiovascular:     Rate and Rhythm: Normal rate and regular rhythm.     Heart sounds: No murmur heard.   No friction rub. No gallop.  Pulmonary:     Effort: Pulmonary effort is normal.     Breath sounds: No stridor. No wheezing, rhonchi or rales.  Abdominal:     General: There is no distension.     Palpations: Abdomen is soft.     Tenderness: There is no abdominal tenderness. There is no right CVA tenderness, left CVA tenderness, guarding or rebound.  Musculoskeletal:        General: No deformity or signs of injury.     Cervical back: Normal range of motion and neck supple. No rigidity.     Right lower leg: No edema.     Left lower leg: No edema.  Lymphadenopathy:     Cervical: No cervical adenopathy.  Skin:    General: Skin is warm and dry.     Capillary Refill: Capillary refill takes less than 2 seconds.  Neurological:     Mental Status: She is alert. Mental status is at baseline. She is disoriented.     Comments: CN II-XII intact, 5/5 strength to all four extremities, coordination grossly intact. Baseline per description provided by family  Psychiatric:     Comments: Varying affect    ED Results / Procedures / Treatments   Labs (all labs ordered are listed, but only abnormal results are displayed) Labs Reviewed  CBC WITH DIFFERENTIAL/PLATELET - Abnormal; Notable for the following components:      Result Value   WBC 2.1 (*)    RBC 3.60 (*)    Hemoglobin 11.2  (*)    HCT 34.8 (*)    Neutro Abs 1.3 (*)    Lymphs Abs 0.3 (*)    All other components within normal limits  COMPREHENSIVE METABOLIC PANEL - Abnormal; Notable for the following components:   Alkaline Phosphatase 31 (*)    All other components within normal limits  URINALYSIS, ROUTINE W REFLEX MICROSCOPIC - Abnormal; Notable for the following components:   Hgb urine dipstick MODERATE (*)    Ketones, ur 5 (*)    Leukocytes,Ua SMALL (*)    All other components within normal limits  TSH - Abnormal; Notable for the following components:   TSH 0.117 (*)    All other components within normal limits  PROTIME-INR  APTT  RAPID URINE DRUG SCREEN, HOSP PERFORMED  BASIC METABOLIC PANEL  CBC WITH DIFFERENTIAL/PLATELET  PATHOLOGIST SMEAR REVIEW  TECHNOLOGIST SMEAR REVIEW  IRON AND TIBC  FERRITIN    EKG EKG Interpretation  Date/Time:  Friday April 03 2021 17:42:58 EDT Ventricular Rate:  65 PR Interval:  119 QRS Duration: 94 QT Interval:  421 QTC Calculation: 438 R Axis:   -7 Text Interpretation: Sinus rhythm Atrial premature complex Borderline short PR interval Consider left atrial enlargement Abnormal R-wave progression, early transition Borderline T wave abnormalities PACS new but otherwise similar to previous Confirmed by Theotis Burrow [phone]) on  04/03/2021 6:27:13 PM  Radiology CT Head Wo Contrast  Result Date: 04/03/2021 CLINICAL DATA:  Headache EXAM: CT HEAD WITHOUT CONTRAST TECHNIQUE: Contiguous axial images were obtained from the base of the skull through the vertex without intravenous contrast. COMPARISON:  CT 07/27/2017 FINDINGS: Brain: Negative for acute intracranial hemorrhage. Atrophy and chronic small vessel ischemic changes of the white matter. Chronic lacunar infarct or dilated perivascular space in the left basal ganglia. Slightly asymmetrical hypodensity within the left cerebellar white matter Vascular: No hyperdense vessels.  Carotid vascular calcification Skull: Normal.  Negative for fracture or focal lesion. Sinuses/Orbits: Moderate mucosal thickening in the sinuses Other: None IMPRESSION: 1. Slightly asymmetrical hypodensity within the left cerebellar white matter, uncertain if this is due to artifact or an area of edema though does appear different compared to prior exams. Suggest MRI for further evaluation 2. Atrophy and chronic small vessel ischemic changes of the white matter Electronically Signed   By: Donavan Foil M.D.   On: 04/03/2021 19:08   DG Chest Portable 1 View  Result Date: 04/03/2021 CLINICAL DATA:  Altered mental status. Brief memory loss. Confusion. EXAM: PORTABLE CHEST 1 VIEW COMPARISON:  06/13/2018 FINDINGS: Right chest port in place. Borderline cardiomegaly with stable mediastinal contours. Calcified granuloma in the right lung again seen. No acute or focal airspace disease. No pneumothorax or pleural effusion. No pulmonary edema. Bilateral shoulder degenerative change. IMPRESSION: No acute chest finding. Borderline cardiomegaly. Electronically Signed   By: Keith Rake M.D.   On: 04/03/2021 18:02    Procedures Procedures   Medications Ordered in ED Medications  simvastatin (ZOCOR) tablet 20 mg (has no administration in time range)  OLANZapine (ZYPREXA) tablet 2.5 mg (has no administration in time range)  enoxaparin (LOVENOX) injection 40 mg (has no administration in time range)  acetaminophen (TYLENOL) tablet 650 mg (has no administration in time range)    Or  acetaminophen (TYLENOL) suppository 650 mg (has no administration in time range)    ED Course  I have reviewed the triage vital signs and the nursing notes.  Pertinent labs & imaging results that were available during my care of the patient were reviewed by me and considered in my medical decision making (see chart for details).    MDM Rules/Calculators/A&P                          This is a 79yoF w/ hx and pe as above.  Ddx included: stroke, ICH, tumor, metabolic  derangement, arrhythmia, sepsis, dementia, intoxication/ingestion  All studies independently reviewed by myself, d/w attending physician, factored into my mdm. -EKG: NSR 65bpm w/ PAC's, borderline L axis deviation, normal intervals, no acute ischemic changes; essentially unchanged compared to prior (12/2019) -CXR: no acute abn, borderline cardiomegaly -[facility] (radiology read): Slightly asymmetrical hypodensity within the left cerebellar white matter, uncertain if this is due to artifact or an area of edema though does appear different compared to prior exams -CBC: WBC 2.1, Hgb 11.2 -TSH 0.117 -unremarkable: UA, CMP, PT/PTT  Presentation most c/w worsening of patient's dementia, although imaging suggests possible new brain lesion. No evidence of ICH. TSH low, may be r/t inconsistent medication regimen adherence in context of dementia (something the family noted on phone call). Remainder of labs reassuring against further metabolic derangement. Does not appear acutely septic. UDS pending, but does not appear acutely intoxicated.  MRI Brain ordered to further work-up abnormality noted on CT. Plan to consult neuro after this has resulted. Will admit  given new finding, worsened neuro status, unsafe living environment. Agreed to bring in patient. Family understands and agrees w/ plan, and sister will come in tomorrow.  Pt HDS on reevaluation prior to signout. Course of care and plan discussed w/ oncoming team, to whom pt care was transferred.  Final Clinical Impression(s) / ED Diagnoses Final diagnoses:  None    Rx / DC Orders ED Discharge Orders     None        Levin Bacon, MD 04/04/21 0118    Rex Kras, Wenda Overland, MD 04/06/21 2203

## 2021-04-03 NOTE — H&P (Addendum)
Date: 04/04/2021               Patient Name:  [name] MRN: [id number]  DOB: 1941-04-19 Age / Sex: 80 y.o., female   PCP: Krystal Caraway, MD         Medical Service: Internal Medicine Teaching Service         Attending Physician: Dr. Velna Ochs, MD    First Contact: Dr. Jeanice [name] Pager: [phone]  Second Contact: Dr. Court [name] Pager: [phone]       After Hours (After 5p/  First Contact Pager: [phone]  weekends / holidays): Second Contact Pager: [phone]   Chief Complaint: subacute AMS  History of Present Illness:  [name] is an 80 year old female with active multiple myeloma and dementia who presents for a decline in her mental status over the past 5w since experiencing an upper respiratory infection. History primarily obtained from pt's sister/POA, [name], due to pt's dementia.  Her sister notes that her dementia has been progressing over the past 2 years. Over the past year, she has had increased issues with managing her medications. They have tried home health and pillboxes however patient has declined to use these. [name] lives in [place], with her sister living in [place], and a son who lives near [place]. Her sister, [name], calls to remind [name] to take her medications daily, however she feels that, often times, [name] forgets that she took her medications, and may take them again.  She has noted more of an acute change following an URI 5 weeks ago. She was called by the bank last week, as [name] had wondered into it and didn't know how to get back home.  [name] notes that [name] has been experiencing severe headaches over the past 5w which she has been taking tylenol for. She is not aware of any other new complaints during this time period.   She expresses concern for her [name]'s safety at home and has been working to get her into a dementia care facility at [facility]. Unfortunately, this has been delayed by needing to get financial matters  in line.   Meds:  Current Meds  Medication Sig   acetaminophen (TYLENOL) 500 MG tablet Take 250-500 mg by mouth every 6 (six) hours as needed for mild pain, moderate pain, fever or headache.    ALPRAZolam (XANAX) 0.5 MG tablet TAKE 1/2 TO 1 (ONE-HALF TO ONE) TABLET BY MOUTH ONCE DAILY AS NEEDED FOR  PANIC (Patient taking differently: Take 0.25-0.5 mg by mouth daily as needed. Panic)   amLODipine (NORVASC) 10 MG tablet Take 10 mg by mouth every evening.    donepezil (ARICEPT) 10 MG tablet Take 10 mg by mouth at bedtime.   levothyroxine (SYNTHROID) 150 MCG tablet Take 150 mcg by mouth every morning.   OLANZapine (ZYPREXA) 2.5 MG tablet Take 1 tablet (2.5 mg total) by mouth at bedtime.   simvastatin (ZOCOR) 20 MG tablet Take 1 tablet (20 mg total) by mouth daily at 6 PM.   traZODone (DESYREL) 50 MG tablet Take 50 mg by mouth at bedtime.     Allergies: Allergies as of 04/03/2021 - Review Complete 04/03/2021  Allergen Reaction Noted   Aldactone [spironolactone] Palpitations 02/15/2012   Atorvastatin Hives and Other (See Comments) 08/14/2014   Doxycycline Other (See Comments) 06/27/2013   Fluconazole Swelling 08/18/2016   Losartan Swelling 01/22/2015   Quinolones Hives, Anxiety, and Other (See Comments) 11/26/2011   Revlimid [lenalidomide]  12/20/2017  Sertraline hcl Palpitations 10/27/2011   Amoxicillin  02/17/2016   Hydrocodone Rash and Other (See Comments) 02/10/2011   Levofloxacin Hives 11/26/2011   Prednisolone Hives and Other (See Comments) 10/15/2015   Sertraline Anxiety and Rash 11/26/2011   Tramadol Rash and Other (See Comments) 06/19/2012   Donepezil hcl  09/11/2020   Levofloxacin Other (See Comments) 03/31/2011   Metronidazole  11/23/2018   Prednisone Other (See Comments) 12/10/2013   Pseudoephedrine  04/02/2013   Pseudoephedrine hcl Other (See Comments) 11/26/2011   Sertraline hcl  10/27/2011   Sudafed [pseudoephedrine hcl]  02/10/2011   Trazodone and nefazodone   02/03/2015   Trazodone hcl Other (See Comments) 04/08/2015   Hydrocodone-acetaminophen Anxiety 01/22/2015   Latex Rash 04/02/2013   Tape Rash 06/03/2013   Past Medical History:  Diagnosis Date   Anxiety    Arthritis    Asthma    Cancer ([place])    THYROID, SKIN, NOSE, BONE   Chronic kidney disease    Diverticulosis    Pt reported on 03/15/12   Eye cancer ([place])    R eye cancer - 3-4 y ago   Eye problems    LOST VISION RIGHT EYE   Hernia    History of blood clots    LEG   Hyperlipidemia    Hypertension    Melanoma ([place])    Melanoma ([place]) 1989   chest   MGUS (monoclonal gammopathy of unknown significance)    MGUS (monoclonal gammopathy of unknown significance)    Mitral valve prolapse    Perforated bowel ([place])    Peritonitis ([facility])    Smoldering multiple myeloma ([place])    Stroke ([place])    Thyroid disease     Family History  Problem Relation Age of Onset   Asthma Father    Diabetes Father    Heart failure Father    Cancer Father        Prostate and kidney cancer   Emphysema Father    Diabetes Sister    Cancer Sister    Stroke Mother 84       Her mother passed away from this stroke at the age of 9   Cancer Brother        brain cancer & prostate cancer   Cervical cancer Sister    Cancer Sister        Recurrent cancer involving the neck and the jaw     Social History: resides independently at her home in [place]. Denies alcohol, tobacco or illicit drug use.  Review of Systems: A complete ROS was negative except as per HPI.   Physical Exam: Blood pressure (!) 160/75, pulse 61, temperature 98.1 F (36.7 C), temperature source Oral, resp. rate 15, SpO2 96 %.  General: well appearing, in NAD HENT: atraumatic, MMM Eyes: no scleral icterus or conjunctival injection Cardiac: RRR, no LE edema, extremities warm Pulm: breathing comfortably on room air, lungs clear throughout GI: not distended, soft, not tender to palpation, no subrapubic tenderness Skin: no rash  or lesion on limited exam Neuro: alert. Oriented to person and place. Not oriented to time or situation. CN II-XII intact. 5/5 strength in all extremties. Finger to nose, heel to shin and rapid alternating movement intact. MSK: normal muscle tone and strength  EKG Interpretation  Date/Time:  Friday April 03 2021 17:42:58 EDT Ventricular Rate:  65 PR Interval:  119 QRS Duration: 94 QT Interval:  421 QTC Calculation: 438 R Axis:   -7 Text Interpretation: Sinus rhythm Atrial premature complex  Borderline short PR interval Consider left atrial enlargement Abnormal R-wave progression, early transition Borderline T wave abnormalities PACS new but otherwise similar to previous Confirmed by Theotis Burrow [phone]) on 04/03/2021 6:27:13 PM   CXR>> no acute findings. Port positioned in the right upper chest wall, calcified granuloma on the right, borderline cardiomegaly  Head CT w/o contrast>>slight asymmetrical hypotensity within the left cerebellar white matter, uncertain if this is due to artifact or an area of edema. Atrophy and chronic small vessel ischemic changes of the white matter.  MRI Brain>>no acute findings, particularly no mass in the cerebellum.   Assessment & Plan by Problem: Active Problems:   Brain lesion  Acute encephalopathy on chronic dementia Metabolic vs toxic vs progression of dementia Metabolic ddx includes hyperthroidism, sequalae of recent URI. Toxic in the setting of multiple centrally acting medications on medication list and difficulty with medication management. Takes xanax, olanzapine Low suspicion for infectious sources. UA clean Plan -UDS -hold synthroid -hold xanax for now, low threshold to restart for s/s of withdrawl -continue home zyprexa -delerium/fall precautions  Hx of hypothyroidism. TSH 0.117 on admission. Question medication mismanagement -hold synthroid  Multiple myeloma Treated at [facility]. Currently treated with velcade,  cyclophosphamide, dexamethasone since 2018. Last cycle received on 03/31/21 Chronic normocytic anemia. Stable from prior labs at [facility] Leukopenia--WBC 2.1. WBC was 5.9 from labs 03/31/21. -Mild neutropenia--ANC 1260 -Lymphopenia--absolute lymphocyte count is 315 Plan -check iron panel -blood smear -repeat labs in AM  Essential hypertension.  -continue home amlodipine and telmasartan Hyperlipidemia  -continue statin Hx of CVA 2003. No clear residual deficits. 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  Signed: RMitzi Hansen MD Internal Medicine Resident PGY-2 [facility] Pager: #[phone]/11/2020 1:22 AM

## 2021-04-04 ENCOUNTER — Observation Stay (HOSPITAL_COMMUNITY): Payer: Medicare HMO

## 2021-04-04 DIAGNOSIS — N3001 Acute cystitis with hematuria: Secondary | ICD-10-CM | POA: Insufficient documentation

## 2021-04-04 DIAGNOSIS — G9340 Encephalopathy, unspecified: Secondary | ICD-10-CM | POA: Diagnosis not present

## 2021-04-04 DIAGNOSIS — N390 Urinary tract infection, site not specified: Secondary | ICD-10-CM | POA: Diagnosis not present

## 2021-04-04 DIAGNOSIS — I129 Hypertensive chronic kidney disease with stage 1 through stage 4 chronic kidney disease, or unspecified chronic kidney disease: Secondary | ICD-10-CM | POA: Diagnosis not present

## 2021-04-04 DIAGNOSIS — F0390 Unspecified dementia without behavioral disturbance: Secondary | ICD-10-CM | POA: Diagnosis not present

## 2021-04-04 DIAGNOSIS — D72819 Decreased white blood cell count, unspecified: Secondary | ICD-10-CM | POA: Diagnosis not present

## 2021-04-04 DIAGNOSIS — G939 Disorder of brain, unspecified: Secondary | ICD-10-CM | POA: Diagnosis present

## 2021-04-04 DIAGNOSIS — U071 COVID-19: Secondary | ICD-10-CM | POA: Diagnosis not present

## 2021-04-04 DIAGNOSIS — J45909 Unspecified asthma, uncomplicated: Secondary | ICD-10-CM | POA: Diagnosis not present

## 2021-04-04 DIAGNOSIS — R4182 Altered mental status, unspecified: Secondary | ICD-10-CM | POA: Diagnosis not present

## 2021-04-04 DIAGNOSIS — E039 Hypothyroidism, unspecified: Secondary | ICD-10-CM | POA: Diagnosis not present

## 2021-04-04 DIAGNOSIS — D702 Other drug-induced agranulocytosis: Secondary | ICD-10-CM | POA: Diagnosis not present

## 2021-04-04 LAB — IRON AND TIBC
Iron: 22 ug/dL — ABNORMAL LOW (ref 28–170)
Saturation Ratios: 8 % — ABNORMAL LOW (ref 10.4–31.8)
TIBC: 277 ug/dL (ref 250–450)
UIBC: 255 ug/dL

## 2021-04-04 LAB — TECHNOLOGIST SMEAR REVIEW: Plt Morphology: NORMAL

## 2021-04-04 LAB — BASIC METABOLIC PANEL
Anion gap: 10 (ref 5–15)
BUN: 18 mg/dL (ref 8–23)
CO2: 24 mmol/L (ref 22–32)
Calcium: 8.9 mg/dL (ref 8.9–10.3)
Chloride: 103 mmol/L (ref 98–111)
Creatinine, Ser: 0.77 mg/dL (ref 0.44–1.00)
GFR, Estimated: >60 mL/min (ref >60)
Glucose, Bld: 81 mg/dL (ref 70–99)
Potassium: 3.3 mmol/L — ABNORMAL LOW (ref 3.5–5.1)
Sodium: 137 mmol/L (ref 135–145)

## 2021-04-04 LAB — CBC WITH DIFFERENTIAL/PLATELET
Abs Immature Granulocytes: 0 10*3/uL (ref 0.00–0.07)
Basophils Absolute: 0 10*3/uL (ref 0.0–0.1)
Basophils Relative: 1 %
Eosinophils Absolute: 0 10*3/uL (ref 0.0–0.5)
Eosinophils Relative: 0 %
HCT: 32.9 % — ABNORMAL LOW (ref 36.0–46.0)
Hemoglobin: 10.9 g/dL — ABNORMAL LOW (ref 12.0–15.0)
Immature Granulocytes: 0 %
Lymphocytes Relative: 20 %
Lymphs Abs: 0.4 10*3/uL — ABNORMAL LOW (ref 0.7–4.0)
MCH: 31.6 pg (ref 26.0–34.0)
MCHC: 33.1 g/dL (ref 30.0–36.0)
MCV: 95.4 fL (ref 80.0–100.0)
Monocytes Absolute: 0.3 10*3/uL (ref 0.1–1.0)
Monocytes Relative: 19 %
Neutro Abs: 1.1 10*3/uL — ABNORMAL LOW (ref 1.7–7.7)
Neutrophils Relative %: 60 %
Platelets: 156 10*3/uL (ref 150–400)
RBC: 3.45 MIL/uL — ABNORMAL LOW (ref 3.87–5.11)
RDW: 13.8 % (ref 11.5–15.5)
WBC: 1.8 10*3/uL — ABNORMAL LOW (ref 4.0–10.5)
nRBC: 0 % (ref 0.0–0.2)

## 2021-04-04 LAB — FERRITIN: Ferritin: 99 ng/mL (ref 11–307)

## 2021-04-04 LAB — SARS CORONAVIRUS 2 (TAT 6-24 HRS): SARS Coronavirus 2: POSITIVE — AB

## 2021-04-04 LAB — T4, FREE: Free T4: 1.17 ng/dL — ABNORMAL HIGH (ref 0.61–1.12)

## 2021-04-04 MED ORDER — ACETAMINOPHEN 325 MG PO TABS: 650.0000 mg | ORAL_TABLET | Freq: Four times a day (QID) | ORAL | Status: DC | PRN

## 2021-04-04 MED ORDER — SIMVASTATIN 20 MG PO TABS: 20.0000 mg | ORAL_TABLET | Freq: Every day | ORAL | Status: DC

## 2021-04-04 MED ORDER — FLUTICASONE PROPIONATE 50 MCG/ACT NA SUSP
1.0000 | Freq: Two times a day (BID) | NASAL | 0 refills | Status: DC
Start: 2021-04-04 — End: 2021-04-04

## 2021-04-04 MED ORDER — NITROFURANTOIN MONOHYD MACRO 100 MG PO CAPS: 100.0000 mg | ORAL_CAPSULE | Freq: Two times a day (BID) | ORAL | 0 refills | Status: AC

## 2021-04-04 MED ORDER — ENOXAPARIN SODIUM 40 MG/0.4ML IJ SOSY
40.0000 mg | PREFILLED_SYRINGE | Freq: Every day | INTRAMUSCULAR | Status: DC
  Administered 2021-04-04: 40 mg via SUBCUTANEOUS
  Filled 2021-04-04: qty 0.4

## 2021-04-04 MED ORDER — ACETAMINOPHEN 650 MG RE SUPP: 650.0000 mg | Freq: Four times a day (QID) | RECTAL | Status: DC | PRN

## 2021-04-04 MED ORDER — FLUTICASONE PROPIONATE 50 MCG/ACT NA SUSP: 1.0000 | Freq: Two times a day (BID) | NASAL | 0 refills | Status: AC

## 2021-04-04 MED ORDER — NITROFURANTOIN MONOHYD MACRO 100 MG PO CAPS: 100.0000 mg | ORAL_CAPSULE | Freq: Two times a day (BID) | ORAL | 0 refills | Status: DC

## 2021-04-04 MED ORDER — GADOBUTROL 1 MMOL/ML IV SOLN
5.0000 mL | Freq: Once | INTRAVENOUS | Status: AC | PRN
  Administered 2021-04-04: 5 mL via INTRAVENOUS

## 2021-04-04 MED ORDER — LEVOTHYROXINE SODIUM 75 MCG PO TABS: 150.0000 ug | ORAL_TABLET | Freq: Every morning | ORAL | Status: DC

## 2021-04-04 MED ORDER — ASPIRIN EC 81 MG PO TBEC
81.0000 mg | DELAYED_RELEASE_TABLET | Freq: Every day | ORAL | Status: DC
  Administered 2021-04-04: 81 mg via ORAL
  Filled 2021-04-04: qty 1

## 2021-04-04 MED ORDER — OLANZAPINE 5 MG PO TABS
2.5000 mg | ORAL_TABLET | Freq: Every day | ORAL | Status: DC
  Administered 2021-04-04: 2.5 mg via ORAL
  Filled 2021-04-04: qty 1

## 2021-04-04 MED ORDER — AMLODIPINE BESYLATE 5 MG PO TABS: 10.0000 mg | ORAL_TABLET | Freq: Every evening | ORAL | Status: DC

## 2021-04-04 MED ORDER — IRBESARTAN 300 MG PO TABS
150.0000 mg | ORAL_TABLET | Freq: Every day | ORAL | Status: DC
  Administered 2021-04-04: 150 mg via ORAL
  Filled 2021-04-04 (×2): qty 0.5
  Filled 2021-04-04: qty 1

## 2021-04-04 NOTE — Discharge Instructions (Addendum)
You were hospitalized for confusion. You possibly have a UTI, and we will start antibiotics. I will follow cultures and call you if we need to change treatment. In addition recommend going to stay with your sister at discharge. She can help manage your medications. Thank you for allowing [place] to be part of your care.    Please note these changes made to your medications:   Please START taking: Macrobid - antibiotic for urinary tract infection. Also sent new prescription for Flonase to help with allergies.   Please STOP taking: Zoloft, you reported no longer taking this but it is listed on your medication list.    Please make sure to follow up with your PCP as discussed.   Please call our clinic if you have any questions or concerns, we may be able to help and keep you from a long and expensive emergency room wait. Our clinic and after hours phone number is [phone], the best time to call is Monday through Friday 9 am to 4 pm but there is always someone available 24/7 if you have an emergency.   My best,  Tamsen Snider, MD

## 2021-04-04 NOTE — ED Notes (Signed)
Pt ambulated to restroom with caregiver. Caregiver will pull car around to main entrance. RN will escort pt to caregiver vehicle.

## 2021-04-04 NOTE — Discharge Summary (Addendum)
Name: [name] MRN: [id number] DOB: September 01, 1941 80 y.o. PCP: Cari Caraway, MD  Date of Admission: 04/03/2021  5:02 PM Date of Discharge:  Attending Physician: Velna Ochs, MD  Discharge Diagnosis: 1.  Dementia 2.  Urinary tract infection 3. Leukopenia, mild neutropenia  4. Abnormal thyroid function tests  Discharge Medications: Allergies as of 04/04/2021       Reactions   Aldactone [spironolactone] Palpitations   Burning in stomach   Atorvastatin Hives, Other (See Comments)   Patient stated legs hurt so bad she could hardly walk    Doxycycline Other (See Comments)   SEVERE HEADACHES   Fluconazole Swelling   Facial swelling   Losartan Swelling   Quinolones Hives, Anxiety, Other (See Comments)   Leg Pain   Revlimid [lenalidomide] Swelling   Angioedema   Sertraline Hcl Palpitations   Amoxicillin Other (See Comments)   Other reaction(s): yeast infection, headache Other reaction(s): Red color (finding), Redness   Hydrocodone Rash   Levofloxacin Hives, Other (See Comments)   Leg Pain   Prednisolone Hives, Other (See Comments)   Headache, left eye swelling, forehead swelling   Sertraline Anxiety, Rash   Tramadol Rash, Other (See Comments)   Headaches    Donepezil Hcl Other (See Comments)    insomnia   Levofloxacin Other (See Comments)   Severe Myalgias   Metronidazole Other (See Comments)   Unknown reaction   Prednisone Other (See Comments)   Pseudoephedrine Other (See Comments)   stroke   Pseudoephedrine Hcl Other (See Comments)   Pt states she had a stroke while taking sudafed   Sertraline Hcl Other (See Comments)   Unknown reaction   Sudafed [pseudoephedrine Hcl] Other (See Comments)   stroke   Trazodone And Nefazodone Other (See Comments)   Unknown side effects   Trazodone Hcl Other (See Comments)   Unknown side affects   Hydrocodone-acetaminophen Anxiety   Latex Rash   Tape Rash        Medication List     STOP taking these medications     sertraline 25 MG tablet Commonly known as: ZOLOFT       TAKE these medications    acetaminophen 500 MG tablet Commonly known as: TYLENOL Take 250-500 mg by mouth every 6 (six) hours as needed for mild pain, moderate pain, fever or headache.   acetic acid 2 % otic solution Place 5 drops into both ears in the morning, at noon, and at bedtime.   acyclovir 400 MG tablet Commonly known as: ZOVIRAX Take 400 mg by mouth 2 (two) times daily. For shingles   ALPRAZolam 0.5 MG tablet Commonly known as: XANAX TAKE 1/2 TO 1 (ONE-HALF TO ONE) TABLET BY MOUTH ONCE DAILY AS NEEDED FOR  PANIC What changed:  how much to take how to take this when to take this reasons to take this additional instructions   amLODipine 10 MG tablet Commonly known as: NORVASC Take 10 mg by mouth every evening.   aspirin 81 MG tablet Chew 81 mg by mouth at bedtime.   azelastine 0.1 % nasal spray Commonly known as: ASTELIN Place 1 spray into both nostrils daily as needed for allergies.   carboxymethylcellulose 0.5 % Soln Commonly known as: REFRESH PLUS Place 1 drop into both eyes 3 (three) times daily as needed (for allergies).   clonazePAM 0.5 MG tablet Commonly known as: KLONOPIN TAKE 1 TO 2 TABLETS BY MOUTH AT BEDTIME AS NEEDED FOR  INSOMNIA What changed: See the new instructions.  donepezil 10 MG tablet Commonly known as: ARICEPT Take 10 mg by mouth at bedtime.   estradiol 0.1 MG/GM vaginal cream Commonly known as: ESTRACE Place 1 Applicatorful vaginally at bedtime.   fexofenadine 180 MG tablet Commonly known as: ALLEGRA Take 180 mg by mouth 2 (two) times daily as needed for allergies.   fluticasone 50 MCG/ACT nasal spray Commonly known as: FLONASE Place 1 spray into both nostrils 2 (two) times daily. What changed:  when to take this reasons to take this   levothyroxine 150 MCG tablet Commonly known as: SYNTHROID Take 150 mcg by mouth every morning.   lidocaine 2 %  jelly Commonly known as: XYLOCAINE Apply 1 application topically 5 (five) times daily.   lidocaine-prilocaine cream Commonly known as: EMLA Apply 1 application topically as needed.   loperamide 2 MG tablet Commonly known as: IMODIUM A-D Take 2-4 mg by mouth 4 (four) times daily as needed for diarrhea or loose stools.   Micardis 40 MG tablet Generic drug: telmisartan Take 1 tablet (40 mg total) by mouth daily.   NEOMYCIN-POLYMYXIN-HYDROCORTISONE 1 % Soln OTIC solution Commonly known as: CORTISPORIN Place 4 drops into both ears 3 (three) times daily.   nitrofurantoin (macrocrystal-monohydrate) 100 MG capsule Commonly known as: Macrobid Take 1 capsule (100 mg total) by mouth 2 (two) times daily for 5 days.   OLANZapine 2.5 MG tablet Commonly known as: ZYPREXA Take 1 tablet (2.5 mg total) by mouth at bedtime.   ondansetron 4 MG disintegrating tablet Commonly known as: ZOFRAN-ODT Take 4 mg by mouth every 8 (eight) hours as needed for nausea/vomiting.   Polyethyl Glycol-Propyl Glycol 0.4-0.3 % Soln Place 1 drop into both eyes 2 (two) times daily as needed (for allergies).   simvastatin 20 MG tablet Commonly known as: ZOCOR Take 1 tablet (20 mg total) by mouth daily at 6 PM.   traMADol 50 MG tablet Commonly known as: ULTRAM Take 1 tablet (50 mg total) by mouth every 6 (six) hours as needed for moderate pain or severe pain.   traZODone 50 MG tablet Commonly known as: DESYREL Take 50 mg by mouth at bedtime.        Disposition and follow-up:   Ms.Jaclyne AZHAR YOGI was discharged from [facility] in Stable condition.  At the hospital follow up visit please address:  1.  Dementia: Patient's sister will manage medications, they are pursuing long-term care facilities. Follow up on progress and medication reconciliation.   UTI: 5 days of Macrobid, we will follow cultures and change therapy if needed. Confusion was patients only symptom. UA + for leukocytes.    Hypothyroidism on levothyroxine: TSH 0.117, T4 1.17 , - Recheck TSH level after patient's sister has been ensuring patient takes appropriate dose of levothyroxine, adjust as needed.  Leukopenia, mild neutropenia: Likely 2/2 to her multiple myeloma treatment. Please repeat CBC with diff at follow up. .  2.  Labs / imaging needed at time of follow-up: TSH, free T4, and CBC with diff  3.  Pending labs/ test needing follow-up: Urine culture  Follow-up Appointments:  Sister reports patient has follow up schedules with PCP on July 16th   Hospital Course by problem list: 1.  Acute encephalopathy on chronic dementia This very pleasant 80 year old presented with worsening confusion and difficulty managing her medications at home.  Work-up did show possible UTI and TSH low suggesting patient may be taking too much of her thyroid medication.  CT head suggested a hypodensity which was confirmed to be  artifact on MRI.  Otherwise patient had chronic small vessel ischemic changes on MRI, no acute cranial abnormalities.  Patient was back at her baseline on morning rounds.  Patient prescribed Macrobid for possible UTI and Flonase for nasal congestion.  She will follow-up with her PCP and discharged under her sister's care.  Her sister is pursuing long-term care facilities , will have patient stay at her home and will manage patient's medication.   Lastly - found to have new neutropenia this admission with mild neutropenia. Suspect this is 2/2 to her multiple myeloma treatment. Please follow up.   Subjective: Patient is sitting up in bed and alert on exam.  She reports she can manage all ADLs/IADLs.  She has been having more confusion and remembers possible mini stroke at the bank.  On further conversation with patient's sister, Enid Derry, patient has been having worsening confusion and they are pursuing a memory care unit.    Discharge Exam:   BP (!) 154/117   Pulse 73   Temp 98.1 F (36.7 C) (Oral)    Resp 20   SpO2 100%  Discharge exam:   General: Well-appearing, pleasant, elderly Cardiovascular: Normal rate, regular rhythm.  No murmurs, rubs, or gallops Pulmonary : Effort normal, breath sounds normal. No wheezes, rales, or rhonchi Abdominal: soft, mild tenderness in left lower quadrant,  bowel sounds present Musculoskeletal: no swelling , deformity, injury ,or tenderness in extremities, Skin: Warm, dry , no bruising, erythema, or rash Neuro: Alert and oriented to person, place, and time.  She was not fully aware of all events leading up to admission.   Pertinent Labs, Studies, and Procedures:  CBC Latest Ref Rng & Units 04/04/2021 04/03/2021 11/23/2018  WBC 4.0 - 10.5 K/uL 1.8(L) 2.1(L) 5.7  Hemoglobin 12.0 - 15.0 g/dL 10.9(L) 11.2(L) 12.4  Hematocrit 36.0 - 46.0 % 32.9(L) 34.8(L) 36.9  Platelets 150 - 400 K/uL 156 173 216   CMP Latest Ref Rng & Units 04/04/2021 04/03/2021 11/23/2018  Glucose 70 - 99 mg/dL 81 86 75  BUN 8 - 23 mg/dL _0 Creatinine 0.44 - 1.00 mg/dL 0.77 0.87 0.77  Sodium 135 - 145 mmol/L 137 137 138  Potassium 3.5 - 5.1 mmol/L 3.3(L) 3.7 3.8  Chloride 98 - 111 mmol/L 103 101 103  CO2 22 - 32 mmol/L _1 Calcium 8.9 - 10.3 mg/dL 8.9 9.1 9.6  Total Protein 6.5 - 8.1 g/dL - 7.0 7.7  Total Bilirubin 0.3 - 1.2 mg/dL - 1.0 0.8  Alkaline Phos 38 - 126 U/L - 31(L) 33(L)  AST 15 - 41 U/L - 19 21  ALT 0 - 44 U/L - 16 15   CT Head Wo Contrast  Result Date: 04/03/2021 CLINICAL DATA:  Headache EXAM: CT HEAD WITHOUT CONTRAST TECHNIQUE: Contiguous axial images were obtained from the base of the skull through the vertex without intravenous contrast. COMPARISON:  CT 07/27/2017 FINDINGS: Brain: Negative for acute intracranial hemorrhage. Atrophy and chronic small vessel ischemic changes of the white matter. Chronic lacunar infarct or dilated perivascular space in the left basal ganglia. Slightly asymmetrical hypodensity within the left cerebellar white matter Vascular: No  hyperdense vessels.  Carotid vascular calcification Skull: Normal. Negative for fracture or focal lesion. Sinuses/Orbits: Moderate mucosal thickening in the sinuses Other: None IMPRESSION: 1. Slightly asymmetrical hypodensity within the left cerebellar white matter, uncertain if this is due to artifact or an area of edema though does appear different compared to prior exams. Suggest MRI for further evaluation  2. Atrophy and chronic small vessel ischemic changes of the white matter Electronically Signed   By: Donavan Foil M.D.   On: 04/03/2021 19:08   MR BRAIN W WO CONTRAST  Result Date: 04/04/2021 CLINICAL DATA:  Alzheimer's dementia. Multiple. Abnormal head CT. Altered mental status. EXAM: MRI HEAD WITHOUT AND WITH CONTRAST TECHNIQUE: Multiplanar, multiecho pulse sequences of the brain and surrounding structures were obtained without and with intravenous contrast. CONTRAST:  64m GADAVIST GADOBUTROL 1 MMOL/ML IV SOLN COMPARISON:  04/10/2012 FINDINGS: Brain: No acute infarct, mass effect or extra-axial collection. Fewer than 5 scattered microhemorrhages in a nonspecific pattern. There is multifocal hyperintense T2-weighted signal within the white matter. Generalized volume loss without a clear lobar predilection. The midline structures are normal. There is no abnormal contrast enhancement. Vascular: Major flow voids are preserved. Skull and upper cervical spine: Normal calvarium and skull base. Visualized upper cervical spine and soft tissues are normal. Sinuses/Orbits:Right maxillary mucosal thickening. No mastoid or middle ear effusion. Normal orbits. IMPRESSION: 1. No acute intracranial abnormality. No mass within the cerebellum. 2. Generalized volume loss without a clear lobar predilection. 3. Findings of mild chronic microvascular disease. Electronically Signed   By: KUlyses JarredM.D.   On: 04/04/2021 01:44   DG Chest Portable 1 View  Result Date: 04/03/2021 CLINICAL DATA:  Altered mental status. Brief  memory loss. Confusion. EXAM: PORTABLE CHEST 1 VIEW COMPARISON:  06/13/2018 FINDINGS: Right chest port in place. Borderline cardiomegaly with stable mediastinal contours. Calcified granuloma in the right lung again seen. No acute or focal airspace disease. No pneumothorax or pleural effusion. No pulmonary edema. Bilateral shoulder degenerative change. IMPRESSION: No acute chest finding. Borderline cardiomegaly. Electronically Signed   By: MKeith RakeM.D.   On: 04/03/2021 18:02     Discharge Instructions: Discharge Instructions     Call MD for:  temperature >100.4   Complete by: As directed    Increase activity slowly   Complete by: As directed        Signed: SMadalyn Rob MD 04/04/2021, 2:28 PM   Pager: [phone]

## 2021-04-04 NOTE — ED Notes (Signed)
Patient transported to MRI 

## 2021-04-04 NOTE — ED Notes (Signed)
Changed patient sheets patient is resting with call bell in reach

## 2021-04-08 LAB — PATHOLOGIST SMEAR REVIEW

## 2021-04-13 DIAGNOSIS — Z95828 Presence of other vascular implants and grafts: Secondary | ICD-10-CM | POA: Diagnosis not present

## 2021-04-13 DIAGNOSIS — E039 Hypothyroidism, unspecified: Secondary | ICD-10-CM | POA: Diagnosis not present

## 2021-04-13 DIAGNOSIS — C9002 Multiple myeloma in relapse: Secondary | ICD-10-CM | POA: Diagnosis not present

## 2021-04-13 DIAGNOSIS — Z79899 Other long term (current) drug therapy: Secondary | ICD-10-CM | POA: Diagnosis not present

## 2021-04-13 DIAGNOSIS — F0391 Unspecified dementia with behavioral disturbance: Secondary | ICD-10-CM | POA: Diagnosis not present

## 2021-04-16 DIAGNOSIS — E039 Hypothyroidism, unspecified: Secondary | ICD-10-CM | POA: Diagnosis not present

## 2021-04-16 DIAGNOSIS — I10 Essential (primary) hypertension: Secondary | ICD-10-CM | POA: Diagnosis not present

## 2021-04-16 DIAGNOSIS — N390 Urinary tract infection, site not specified: Secondary | ICD-10-CM | POA: Diagnosis not present

## 2021-04-16 DIAGNOSIS — F0391 Unspecified dementia with behavioral disturbance: Secondary | ICD-10-CM | POA: Diagnosis not present

## 2021-04-27 DIAGNOSIS — Z5111 Encounter for antineoplastic chemotherapy: Secondary | ICD-10-CM | POA: Diagnosis not present

## 2021-04-27 DIAGNOSIS — Z95828 Presence of other vascular implants and grafts: Secondary | ICD-10-CM | POA: Diagnosis not present

## 2021-04-27 DIAGNOSIS — C9002 Multiple myeloma in relapse: Secondary | ICD-10-CM | POA: Diagnosis not present

## 2021-05-07 DIAGNOSIS — F0390 Unspecified dementia without behavioral disturbance: Secondary | ICD-10-CM | POA: Diagnosis not present

## 2021-05-07 DIAGNOSIS — F0391 Unspecified dementia with behavioral disturbance: Secondary | ICD-10-CM | POA: Diagnosis not present

## 2021-05-07 DIAGNOSIS — K219 Gastro-esophageal reflux disease without esophagitis: Secondary | ICD-10-CM | POA: Diagnosis not present

## 2021-05-07 DIAGNOSIS — I10 Essential (primary) hypertension: Secondary | ICD-10-CM | POA: Diagnosis not present

## 2021-05-07 DIAGNOSIS — E039 Hypothyroidism, unspecified: Secondary | ICD-10-CM | POA: Diagnosis not present

## 2021-05-07 DIAGNOSIS — G309 Alzheimer's disease, unspecified: Secondary | ICD-10-CM | POA: Diagnosis not present

## 2021-05-07 DIAGNOSIS — F0280 Dementia in other diseases classified elsewhere without behavioral disturbance: Secondary | ICD-10-CM | POA: Diagnosis not present

## 2021-05-07 DIAGNOSIS — E782 Mixed hyperlipidemia: Secondary | ICD-10-CM | POA: Diagnosis not present

## 2021-05-11 DIAGNOSIS — Z95828 Presence of other vascular implants and grafts: Secondary | ICD-10-CM | POA: Diagnosis not present

## 2021-05-11 DIAGNOSIS — Z5111 Encounter for antineoplastic chemotherapy: Secondary | ICD-10-CM | POA: Diagnosis not present

## 2021-05-11 DIAGNOSIS — C9002 Multiple myeloma in relapse: Secondary | ICD-10-CM | POA: Diagnosis not present

## 2021-05-12 DIAGNOSIS — H548 Legal blindness, as defined in USA: Secondary | ICD-10-CM | POA: Diagnosis not present

## 2021-05-12 DIAGNOSIS — C9000 Multiple myeloma not having achieved remission: Secondary | ICD-10-CM | POA: Diagnosis not present

## 2021-05-12 DIAGNOSIS — I10 Essential (primary) hypertension: Secondary | ICD-10-CM | POA: Diagnosis not present

## 2021-05-12 DIAGNOSIS — E039 Hypothyroidism, unspecified: Secondary | ICD-10-CM | POA: Diagnosis not present

## 2021-05-12 DIAGNOSIS — E785 Hyperlipidemia, unspecified: Secondary | ICD-10-CM | POA: Diagnosis not present

## 2021-05-12 DIAGNOSIS — C6931 Malignant neoplasm of right choroid: Secondary | ICD-10-CM | POA: Diagnosis not present

## 2021-05-12 DIAGNOSIS — F0280 Dementia in other diseases classified elsewhere without behavioral disturbance: Secondary | ICD-10-CM | POA: Diagnosis not present

## 2021-05-12 DIAGNOSIS — G309 Alzheimer's disease, unspecified: Secondary | ICD-10-CM | POA: Diagnosis not present

## 2021-05-19 DIAGNOSIS — F411 Generalized anxiety disorder: Secondary | ICD-10-CM | POA: Diagnosis not present

## 2021-05-19 DIAGNOSIS — R350 Frequency of micturition: Secondary | ICD-10-CM | POA: Diagnosis not present

## 2021-05-19 DIAGNOSIS — I10 Essential (primary) hypertension: Secondary | ICD-10-CM | POA: Diagnosis not present

## 2021-05-19 DIAGNOSIS — E782 Mixed hyperlipidemia: Secondary | ICD-10-CM | POA: Diagnosis not present

## 2021-05-19 DIAGNOSIS — G309 Alzheimer's disease, unspecified: Secondary | ICD-10-CM | POA: Diagnosis not present

## 2021-05-19 DIAGNOSIS — E039 Hypothyroidism, unspecified: Secondary | ICD-10-CM | POA: Diagnosis not present

## 2021-05-19 DIAGNOSIS — C9000 Multiple myeloma not having achieved remission: Secondary | ICD-10-CM | POA: Diagnosis not present

## 2021-05-21 DIAGNOSIS — F411 Generalized anxiety disorder: Secondary | ICD-10-CM | POA: Diagnosis not present

## 2021-05-21 DIAGNOSIS — I10 Essential (primary) hypertension: Secondary | ICD-10-CM | POA: Diagnosis not present

## 2021-05-21 DIAGNOSIS — E039 Hypothyroidism, unspecified: Secondary | ICD-10-CM | POA: Diagnosis not present

## 2021-05-21 DIAGNOSIS — F4322 Adjustment disorder with anxiety: Secondary | ICD-10-CM | POA: Diagnosis not present

## 2021-05-21 DIAGNOSIS — F29 Unspecified psychosis not due to a substance or known physiological condition: Secondary | ICD-10-CM | POA: Diagnosis not present

## 2021-05-21 DIAGNOSIS — D649 Anemia, unspecified: Secondary | ICD-10-CM | POA: Diagnosis not present

## 2021-05-21 DIAGNOSIS — F0391 Unspecified dementia with behavioral disturbance: Secondary | ICD-10-CM | POA: Diagnosis not present

## 2021-05-25 DIAGNOSIS — Z95828 Presence of other vascular implants and grafts: Secondary | ICD-10-CM | POA: Diagnosis not present

## 2021-05-25 DIAGNOSIS — Z79899 Other long term (current) drug therapy: Secondary | ICD-10-CM | POA: Diagnosis not present

## 2021-05-25 DIAGNOSIS — C9002 Multiple myeloma in relapse: Secondary | ICD-10-CM | POA: Diagnosis not present

## 2021-05-25 DIAGNOSIS — F0391 Unspecified dementia with behavioral disturbance: Secondary | ICD-10-CM | POA: Diagnosis not present

## 2021-05-26 DIAGNOSIS — F4322 Adjustment disorder with anxiety: Secondary | ICD-10-CM | POA: Diagnosis not present

## 2021-06-02 DIAGNOSIS — G309 Alzheimer's disease, unspecified: Secondary | ICD-10-CM | POA: Diagnosis not present

## 2021-06-02 DIAGNOSIS — C9000 Multiple myeloma not having achieved remission: Secondary | ICD-10-CM | POA: Diagnosis not present

## 2021-06-02 DIAGNOSIS — R451 Restlessness and agitation: Secondary | ICD-10-CM | POA: Diagnosis not present

## 2021-06-02 DIAGNOSIS — F419 Anxiety disorder, unspecified: Secondary | ICD-10-CM | POA: Diagnosis not present

## 2021-06-04 DIAGNOSIS — F411 Generalized anxiety disorder: Secondary | ICD-10-CM | POA: Diagnosis not present

## 2021-06-04 DIAGNOSIS — F0391 Unspecified dementia with behavioral disturbance: Secondary | ICD-10-CM | POA: Diagnosis not present

## 2021-06-04 DIAGNOSIS — F29 Unspecified psychosis not due to a substance or known physiological condition: Secondary | ICD-10-CM | POA: Diagnosis not present

## 2021-06-09 DIAGNOSIS — Z79890 Hormone replacement therapy (postmenopausal): Secondary | ICD-10-CM | POA: Diagnosis not present

## 2021-06-09 DIAGNOSIS — Z79899 Other long term (current) drug therapy: Secondary | ICD-10-CM | POA: Diagnosis not present

## 2021-06-09 DIAGNOSIS — L239 Allergic contact dermatitis, unspecified cause: Secondary | ICD-10-CM | POA: Diagnosis not present

## 2021-06-09 DIAGNOSIS — G309 Alzheimer's disease, unspecified: Secondary | ICD-10-CM | POA: Diagnosis not present

## 2021-06-09 DIAGNOSIS — R55 Syncope and collapse: Secondary | ICD-10-CM | POA: Diagnosis not present

## 2021-06-09 DIAGNOSIS — F0280 Dementia in other diseases classified elsewhere without behavioral disturbance: Secondary | ICD-10-CM | POA: Diagnosis not present

## 2021-06-09 DIAGNOSIS — R42 Dizziness and giddiness: Secondary | ICD-10-CM | POA: Diagnosis not present

## 2021-06-09 DIAGNOSIS — Z7982 Long term (current) use of aspirin: Secondary | ICD-10-CM | POA: Diagnosis not present

## 2021-06-09 DIAGNOSIS — I6782 Cerebral ischemia: Secondary | ICD-10-CM | POA: Diagnosis not present

## 2021-06-09 DIAGNOSIS — C9000 Multiple myeloma not having achieved remission: Secondary | ICD-10-CM | POA: Diagnosis not present

## 2021-06-09 DIAGNOSIS — R9082 White matter disease, unspecified: Secondary | ICD-10-CM | POA: Diagnosis not present

## 2021-06-09 DIAGNOSIS — R0789 Other chest pain: Secondary | ICD-10-CM | POA: Diagnosis not present

## 2021-06-09 DIAGNOSIS — I10 Essential (primary) hypertension: Secondary | ICD-10-CM | POA: Diagnosis not present

## 2021-06-09 DIAGNOSIS — E119 Type 2 diabetes mellitus without complications: Secondary | ICD-10-CM | POA: Diagnosis not present

## 2021-06-09 DIAGNOSIS — G319 Degenerative disease of nervous system, unspecified: Secondary | ICD-10-CM | POA: Diagnosis not present

## 2021-06-10 DIAGNOSIS — G319 Degenerative disease of nervous system, unspecified: Secondary | ICD-10-CM | POA: Diagnosis not present

## 2021-06-10 DIAGNOSIS — I6782 Cerebral ischemia: Secondary | ICD-10-CM | POA: Diagnosis not present

## 2021-06-10 DIAGNOSIS — R42 Dizziness and giddiness: Secondary | ICD-10-CM | POA: Diagnosis not present

## 2021-06-10 DIAGNOSIS — C9000 Multiple myeloma not having achieved remission: Secondary | ICD-10-CM | POA: Diagnosis not present

## 2021-06-10 DIAGNOSIS — G301 Alzheimer's disease with late onset: Secondary | ICD-10-CM | POA: Diagnosis not present

## 2021-06-10 DIAGNOSIS — F0280 Dementia in other diseases classified elsewhere without behavioral disturbance: Secondary | ICD-10-CM | POA: Diagnosis not present

## 2021-06-15 DIAGNOSIS — R208 Other disturbances of skin sensation: Secondary | ICD-10-CM | POA: Diagnosis not present

## 2021-06-15 DIAGNOSIS — F0391 Unspecified dementia with behavioral disturbance: Secondary | ICD-10-CM | POA: Diagnosis not present

## 2021-06-15 DIAGNOSIS — R519 Headache, unspecified: Secondary | ICD-10-CM | POA: Diagnosis not present

## 2021-06-15 DIAGNOSIS — Z5111 Encounter for antineoplastic chemotherapy: Secondary | ICD-10-CM | POA: Diagnosis not present

## 2021-06-15 DIAGNOSIS — Z79899 Other long term (current) drug therapy: Secondary | ICD-10-CM | POA: Diagnosis not present

## 2021-06-15 DIAGNOSIS — C9002 Multiple myeloma in relapse: Secondary | ICD-10-CM | POA: Diagnosis not present

## 2021-06-15 DIAGNOSIS — Z95828 Presence of other vascular implants and grafts: Secondary | ICD-10-CM | POA: Diagnosis not present

## 2021-06-23 DIAGNOSIS — F0390 Unspecified dementia without behavioral disturbance: Secondary | ICD-10-CM | POA: Diagnosis not present

## 2021-06-23 DIAGNOSIS — R600 Localized edema: Secondary | ICD-10-CM | POA: Diagnosis not present

## 2021-06-23 DIAGNOSIS — K219 Gastro-esophageal reflux disease without esophagitis: Secondary | ICD-10-CM | POA: Diagnosis not present

## 2021-06-23 DIAGNOSIS — E039 Hypothyroidism, unspecified: Secondary | ICD-10-CM | POA: Diagnosis not present

## 2021-06-23 DIAGNOSIS — C9000 Multiple myeloma not having achieved remission: Secondary | ICD-10-CM | POA: Diagnosis not present

## 2021-06-23 DIAGNOSIS — E782 Mixed hyperlipidemia: Secondary | ICD-10-CM | POA: Diagnosis not present

## 2021-06-23 DIAGNOSIS — I10 Essential (primary) hypertension: Secondary | ICD-10-CM | POA: Diagnosis not present

## 2021-06-23 DIAGNOSIS — F0280 Dementia in other diseases classified elsewhere without behavioral disturbance: Secondary | ICD-10-CM | POA: Diagnosis not present

## 2021-06-23 DIAGNOSIS — F0391 Unspecified dementia with behavioral disturbance: Secondary | ICD-10-CM | POA: Diagnosis not present

## 2021-06-23 DIAGNOSIS — G309 Alzheimer's disease, unspecified: Secondary | ICD-10-CM | POA: Diagnosis not present

## 2021-06-24 DIAGNOSIS — F411 Generalized anxiety disorder: Secondary | ICD-10-CM | POA: Diagnosis not present

## 2021-06-25 DIAGNOSIS — I10 Essential (primary) hypertension: Secondary | ICD-10-CM | POA: Diagnosis not present

## 2021-06-26 DIAGNOSIS — F0391 Unspecified dementia with behavioral disturbance: Secondary | ICD-10-CM | POA: Diagnosis not present

## 2021-06-26 DIAGNOSIS — F411 Generalized anxiety disorder: Secondary | ICD-10-CM | POA: Diagnosis not present

## 2021-06-26 DIAGNOSIS — F29 Unspecified psychosis not due to a substance or known physiological condition: Secondary | ICD-10-CM | POA: Diagnosis not present

## 2021-07-01 DIAGNOSIS — M25512 Pain in left shoulder: Secondary | ICD-10-CM | POA: Diagnosis not present

## 2021-07-01 DIAGNOSIS — C9002 Multiple myeloma in relapse: Secondary | ICD-10-CM | POA: Diagnosis not present

## 2021-07-01 DIAGNOSIS — Z5111 Encounter for antineoplastic chemotherapy: Secondary | ICD-10-CM | POA: Diagnosis not present

## 2021-07-01 DIAGNOSIS — M47813 Spondylosis without myelopathy or radiculopathy, cervicothoracic region: Secondary | ICD-10-CM | POA: Diagnosis not present

## 2021-07-01 DIAGNOSIS — M19012 Primary osteoarthritis, left shoulder: Secondary | ICD-10-CM | POA: Diagnosis not present

## 2021-07-01 DIAGNOSIS — Z95828 Presence of other vascular implants and grafts: Secondary | ICD-10-CM | POA: Diagnosis not present

## 2021-07-01 DIAGNOSIS — R609 Edema, unspecified: Secondary | ICD-10-CM | POA: Diagnosis not present

## 2021-07-01 DIAGNOSIS — R936 Abnormal findings on diagnostic imaging of limbs: Secondary | ICD-10-CM | POA: Diagnosis not present

## 2021-07-01 DIAGNOSIS — M85812 Other specified disorders of bone density and structure, left shoulder: Secondary | ICD-10-CM | POA: Diagnosis not present

## 2021-07-06 DIAGNOSIS — M545 Low back pain, unspecified: Secondary | ICD-10-CM | POA: Diagnosis not present

## 2021-07-06 DIAGNOSIS — M5136 Other intervertebral disc degeneration, lumbar region: Secondary | ICD-10-CM | POA: Diagnosis not present

## 2021-07-06 DIAGNOSIS — M25511 Pain in right shoulder: Secondary | ICD-10-CM | POA: Diagnosis not present

## 2021-07-07 DIAGNOSIS — R451 Restlessness and agitation: Secondary | ICD-10-CM | POA: Diagnosis not present

## 2021-07-07 DIAGNOSIS — G309 Alzheimer's disease, unspecified: Secondary | ICD-10-CM | POA: Diagnosis not present

## 2021-07-07 DIAGNOSIS — C9000 Multiple myeloma not having achieved remission: Secondary | ICD-10-CM | POA: Diagnosis not present

## 2021-07-07 DIAGNOSIS — R600 Localized edema: Secondary | ICD-10-CM | POA: Diagnosis not present

## 2021-07-09 DIAGNOSIS — I10 Essential (primary) hypertension: Secondary | ICD-10-CM | POA: Diagnosis not present

## 2021-07-13 DIAGNOSIS — F0282 Dementia in other diseases classified elsewhere, unspecified severity, with psychotic disturbance: Secondary | ICD-10-CM | POA: Diagnosis not present

## 2021-07-13 DIAGNOSIS — I10 Essential (primary) hypertension: Secondary | ICD-10-CM | POA: Diagnosis not present

## 2021-07-13 DIAGNOSIS — E785 Hyperlipidemia, unspecified: Secondary | ICD-10-CM | POA: Diagnosis not present

## 2021-07-13 DIAGNOSIS — F0284 Dementia in other diseases classified elsewhere, unspecified severity, with anxiety: Secondary | ICD-10-CM | POA: Diagnosis not present

## 2021-07-13 DIAGNOSIS — K219 Gastro-esophageal reflux disease without esophagitis: Secondary | ICD-10-CM | POA: Diagnosis not present

## 2021-07-13 DIAGNOSIS — G309 Alzheimer's disease, unspecified: Secondary | ICD-10-CM | POA: Diagnosis not present

## 2021-07-13 DIAGNOSIS — F02811 Dementia in other diseases classified elsewhere, unspecified severity, with agitation: Secondary | ICD-10-CM | POA: Diagnosis not present

## 2021-07-13 DIAGNOSIS — E039 Hypothyroidism, unspecified: Secondary | ICD-10-CM | POA: Diagnosis not present

## 2021-07-13 DIAGNOSIS — C9000 Multiple myeloma not having achieved remission: Secondary | ICD-10-CM | POA: Diagnosis not present

## 2021-07-14 DIAGNOSIS — Z85820 Personal history of malignant melanoma of skin: Secondary | ICD-10-CM | POA: Diagnosis not present

## 2021-07-14 DIAGNOSIS — C9002 Multiple myeloma in relapse: Secondary | ICD-10-CM | POA: Diagnosis not present

## 2021-07-14 DIAGNOSIS — Z923 Personal history of irradiation: Secondary | ICD-10-CM | POA: Diagnosis not present

## 2021-07-14 DIAGNOSIS — Z85850 Personal history of malignant neoplasm of thyroid: Secondary | ICD-10-CM | POA: Diagnosis not present

## 2021-07-14 DIAGNOSIS — R4189 Other symptoms and signs involving cognitive functions and awareness: Secondary | ICD-10-CM | POA: Diagnosis not present

## 2021-07-14 DIAGNOSIS — Z79899 Other long term (current) drug therapy: Secondary | ICD-10-CM | POA: Diagnosis not present

## 2021-07-14 DIAGNOSIS — Z7952 Long term (current) use of systemic steroids: Secondary | ICD-10-CM | POA: Diagnosis not present

## 2021-07-14 DIAGNOSIS — F03918 Unspecified dementia, unspecified severity, with other behavioral disturbance: Secondary | ICD-10-CM | POA: Diagnosis not present

## 2021-07-14 DIAGNOSIS — I071 Rheumatic tricuspid insufficiency: Secondary | ICD-10-CM | POA: Diagnosis not present

## 2021-07-15 DIAGNOSIS — F0282 Dementia in other diseases classified elsewhere, unspecified severity, with psychotic disturbance: Secondary | ICD-10-CM | POA: Diagnosis not present

## 2021-07-15 DIAGNOSIS — G309 Alzheimer's disease, unspecified: Secondary | ICD-10-CM | POA: Diagnosis not present

## 2021-07-15 DIAGNOSIS — E039 Hypothyroidism, unspecified: Secondary | ICD-10-CM | POA: Diagnosis not present

## 2021-07-15 DIAGNOSIS — E785 Hyperlipidemia, unspecified: Secondary | ICD-10-CM | POA: Diagnosis not present

## 2021-07-15 DIAGNOSIS — F0284 Dementia in other diseases classified elsewhere, unspecified severity, with anxiety: Secondary | ICD-10-CM | POA: Diagnosis not present

## 2021-07-15 DIAGNOSIS — F02811 Dementia in other diseases classified elsewhere, unspecified severity, with agitation: Secondary | ICD-10-CM | POA: Diagnosis not present

## 2021-07-15 DIAGNOSIS — I10 Essential (primary) hypertension: Secondary | ICD-10-CM | POA: Diagnosis not present

## 2021-07-15 DIAGNOSIS — K219 Gastro-esophageal reflux disease without esophagitis: Secondary | ICD-10-CM | POA: Diagnosis not present

## 2021-07-15 DIAGNOSIS — C9000 Multiple myeloma not having achieved remission: Secondary | ICD-10-CM | POA: Diagnosis not present

## 2021-07-20 DIAGNOSIS — K219 Gastro-esophageal reflux disease without esophagitis: Secondary | ICD-10-CM | POA: Diagnosis not present

## 2021-07-20 DIAGNOSIS — F0282 Dementia in other diseases classified elsewhere, unspecified severity, with psychotic disturbance: Secondary | ICD-10-CM | POA: Diagnosis not present

## 2021-07-20 DIAGNOSIS — E785 Hyperlipidemia, unspecified: Secondary | ICD-10-CM | POA: Diagnosis not present

## 2021-07-20 DIAGNOSIS — C9000 Multiple myeloma not having achieved remission: Secondary | ICD-10-CM | POA: Diagnosis not present

## 2021-07-20 DIAGNOSIS — G309 Alzheimer's disease, unspecified: Secondary | ICD-10-CM | POA: Diagnosis not present

## 2021-07-20 DIAGNOSIS — F0284 Dementia in other diseases classified elsewhere, unspecified severity, with anxiety: Secondary | ICD-10-CM | POA: Diagnosis not present

## 2021-07-20 DIAGNOSIS — F02811 Dementia in other diseases classified elsewhere, unspecified severity, with agitation: Secondary | ICD-10-CM | POA: Diagnosis not present

## 2021-07-20 DIAGNOSIS — H10423 Simple chronic conjunctivitis, bilateral: Secondary | ICD-10-CM | POA: Diagnosis not present

## 2021-07-20 DIAGNOSIS — I10 Essential (primary) hypertension: Secondary | ICD-10-CM | POA: Diagnosis not present

## 2021-07-20 DIAGNOSIS — E039 Hypothyroidism, unspecified: Secondary | ICD-10-CM | POA: Diagnosis not present

## 2021-07-23 DIAGNOSIS — F0284 Dementia in other diseases classified elsewhere, unspecified severity, with anxiety: Secondary | ICD-10-CM | POA: Diagnosis not present

## 2021-07-23 DIAGNOSIS — F0282 Dementia in other diseases classified elsewhere, unspecified severity, with psychotic disturbance: Secondary | ICD-10-CM | POA: Diagnosis not present

## 2021-07-23 DIAGNOSIS — F29 Unspecified psychosis not due to a substance or known physiological condition: Secondary | ICD-10-CM | POA: Diagnosis not present

## 2021-07-23 DIAGNOSIS — E785 Hyperlipidemia, unspecified: Secondary | ICD-10-CM | POA: Diagnosis not present

## 2021-07-23 DIAGNOSIS — K219 Gastro-esophageal reflux disease without esophagitis: Secondary | ICD-10-CM | POA: Diagnosis not present

## 2021-07-23 DIAGNOSIS — I10 Essential (primary) hypertension: Secondary | ICD-10-CM | POA: Diagnosis not present

## 2021-07-23 DIAGNOSIS — F411 Generalized anxiety disorder: Secondary | ICD-10-CM | POA: Diagnosis not present

## 2021-07-23 DIAGNOSIS — C9000 Multiple myeloma not having achieved remission: Secondary | ICD-10-CM | POA: Diagnosis not present

## 2021-07-23 DIAGNOSIS — G309 Alzheimer's disease, unspecified: Secondary | ICD-10-CM | POA: Diagnosis not present

## 2021-07-23 DIAGNOSIS — F03918 Unspecified dementia, unspecified severity, with other behavioral disturbance: Secondary | ICD-10-CM | POA: Diagnosis not present

## 2021-07-23 DIAGNOSIS — F02811 Dementia in other diseases classified elsewhere, unspecified severity, with agitation: Secondary | ICD-10-CM | POA: Diagnosis not present

## 2021-07-23 DIAGNOSIS — E039 Hypothyroidism, unspecified: Secondary | ICD-10-CM | POA: Diagnosis not present

## 2021-07-27 DIAGNOSIS — E039 Hypothyroidism, unspecified: Secondary | ICD-10-CM | POA: Diagnosis not present

## 2021-07-27 DIAGNOSIS — C9000 Multiple myeloma not having achieved remission: Secondary | ICD-10-CM | POA: Diagnosis not present

## 2021-07-27 DIAGNOSIS — E785 Hyperlipidemia, unspecified: Secondary | ICD-10-CM | POA: Diagnosis not present

## 2021-07-27 DIAGNOSIS — F0284 Dementia in other diseases classified elsewhere, unspecified severity, with anxiety: Secondary | ICD-10-CM | POA: Diagnosis not present

## 2021-07-27 DIAGNOSIS — G309 Alzheimer's disease, unspecified: Secondary | ICD-10-CM | POA: Diagnosis not present

## 2021-07-27 DIAGNOSIS — F0282 Dementia in other diseases classified elsewhere, unspecified severity, with psychotic disturbance: Secondary | ICD-10-CM | POA: Diagnosis not present

## 2021-07-27 DIAGNOSIS — I10 Essential (primary) hypertension: Secondary | ICD-10-CM | POA: Diagnosis not present

## 2021-07-27 DIAGNOSIS — F02811 Dementia in other diseases classified elsewhere, unspecified severity, with agitation: Secondary | ICD-10-CM | POA: Diagnosis not present

## 2021-07-27 DIAGNOSIS — K219 Gastro-esophageal reflux disease without esophagitis: Secondary | ICD-10-CM | POA: Diagnosis not present

## 2021-07-28 DIAGNOSIS — Z5111 Encounter for antineoplastic chemotherapy: Secondary | ICD-10-CM | POA: Diagnosis not present

## 2021-07-28 DIAGNOSIS — C9002 Multiple myeloma in relapse: Secondary | ICD-10-CM | POA: Diagnosis not present

## 2021-07-28 DIAGNOSIS — Z95828 Presence of other vascular implants and grafts: Secondary | ICD-10-CM | POA: Diagnosis not present

## 2021-07-28 DIAGNOSIS — F411 Generalized anxiety disorder: Secondary | ICD-10-CM | POA: Diagnosis not present

## 2021-07-29 DIAGNOSIS — G309 Alzheimer's disease, unspecified: Secondary | ICD-10-CM | POA: Diagnosis not present

## 2021-07-30 DIAGNOSIS — F0282 Dementia in other diseases classified elsewhere, unspecified severity, with psychotic disturbance: Secondary | ICD-10-CM | POA: Diagnosis not present

## 2021-07-30 DIAGNOSIS — E039 Hypothyroidism, unspecified: Secondary | ICD-10-CM | POA: Diagnosis not present

## 2021-07-30 DIAGNOSIS — I10 Essential (primary) hypertension: Secondary | ICD-10-CM | POA: Diagnosis not present

## 2021-07-30 DIAGNOSIS — F0284 Dementia in other diseases classified elsewhere, unspecified severity, with anxiety: Secondary | ICD-10-CM | POA: Diagnosis not present

## 2021-07-30 DIAGNOSIS — C9000 Multiple myeloma not having achieved remission: Secondary | ICD-10-CM | POA: Diagnosis not present

## 2021-07-30 DIAGNOSIS — G309 Alzheimer's disease, unspecified: Secondary | ICD-10-CM | POA: Diagnosis not present

## 2021-07-30 DIAGNOSIS — R8290 Unspecified abnormal findings in urine: Secondary | ICD-10-CM | POA: Diagnosis not present

## 2021-07-30 DIAGNOSIS — R8279 Other abnormal findings on microbiological examination of urine: Secondary | ICD-10-CM | POA: Diagnosis not present

## 2021-07-30 DIAGNOSIS — K219 Gastro-esophageal reflux disease without esophagitis: Secondary | ICD-10-CM | POA: Diagnosis not present

## 2021-07-30 DIAGNOSIS — E785 Hyperlipidemia, unspecified: Secondary | ICD-10-CM | POA: Diagnosis not present

## 2021-07-30 DIAGNOSIS — F02811 Dementia in other diseases classified elsewhere, unspecified severity, with agitation: Secondary | ICD-10-CM | POA: Diagnosis not present

## 2021-08-04 DIAGNOSIS — E785 Hyperlipidemia, unspecified: Secondary | ICD-10-CM | POA: Diagnosis not present

## 2021-08-04 DIAGNOSIS — I10 Essential (primary) hypertension: Secondary | ICD-10-CM | POA: Diagnosis not present

## 2021-08-04 DIAGNOSIS — F02811 Dementia in other diseases classified elsewhere, unspecified severity, with agitation: Secondary | ICD-10-CM | POA: Diagnosis not present

## 2021-08-04 DIAGNOSIS — F0282 Dementia in other diseases classified elsewhere, unspecified severity, with psychotic disturbance: Secondary | ICD-10-CM | POA: Diagnosis not present

## 2021-08-04 DIAGNOSIS — G309 Alzheimer's disease, unspecified: Secondary | ICD-10-CM | POA: Diagnosis not present

## 2021-08-04 DIAGNOSIS — E039 Hypothyroidism, unspecified: Secondary | ICD-10-CM | POA: Diagnosis not present

## 2021-08-04 DIAGNOSIS — F0284 Dementia in other diseases classified elsewhere, unspecified severity, with anxiety: Secondary | ICD-10-CM | POA: Diagnosis not present

## 2021-08-04 DIAGNOSIS — K219 Gastro-esophageal reflux disease without esophagitis: Secondary | ICD-10-CM | POA: Diagnosis not present

## 2021-08-04 DIAGNOSIS — C9000 Multiple myeloma not having achieved remission: Secondary | ICD-10-CM | POA: Diagnosis not present

## 2021-08-06 DIAGNOSIS — F0284 Dementia in other diseases classified elsewhere, unspecified severity, with anxiety: Secondary | ICD-10-CM | POA: Diagnosis not present

## 2021-08-06 DIAGNOSIS — K219 Gastro-esophageal reflux disease without esophagitis: Secondary | ICD-10-CM | POA: Diagnosis not present

## 2021-08-06 DIAGNOSIS — F411 Generalized anxiety disorder: Secondary | ICD-10-CM | POA: Diagnosis not present

## 2021-08-06 DIAGNOSIS — E785 Hyperlipidemia, unspecified: Secondary | ICD-10-CM | POA: Diagnosis not present

## 2021-08-06 DIAGNOSIS — I10 Essential (primary) hypertension: Secondary | ICD-10-CM | POA: Diagnosis not present

## 2021-08-06 DIAGNOSIS — F29 Unspecified psychosis not due to a substance or known physiological condition: Secondary | ICD-10-CM | POA: Diagnosis not present

## 2021-08-06 DIAGNOSIS — F0282 Dementia in other diseases classified elsewhere, unspecified severity, with psychotic disturbance: Secondary | ICD-10-CM | POA: Diagnosis not present

## 2021-08-06 DIAGNOSIS — E039 Hypothyroidism, unspecified: Secondary | ICD-10-CM | POA: Diagnosis not present

## 2021-08-06 DIAGNOSIS — C9000 Multiple myeloma not having achieved remission: Secondary | ICD-10-CM | POA: Diagnosis not present

## 2021-08-06 DIAGNOSIS — F03918 Unspecified dementia, unspecified severity, with other behavioral disturbance: Secondary | ICD-10-CM | POA: Diagnosis not present

## 2021-08-06 DIAGNOSIS — G309 Alzheimer's disease, unspecified: Secondary | ICD-10-CM | POA: Diagnosis not present

## 2021-08-06 DIAGNOSIS — F02811 Dementia in other diseases classified elsewhere, unspecified severity, with agitation: Secondary | ICD-10-CM | POA: Diagnosis not present

## 2021-08-10 DIAGNOSIS — F02811 Dementia in other diseases classified elsewhere, unspecified severity, with agitation: Secondary | ICD-10-CM | POA: Diagnosis not present

## 2021-08-10 DIAGNOSIS — E785 Hyperlipidemia, unspecified: Secondary | ICD-10-CM | POA: Diagnosis not present

## 2021-08-10 DIAGNOSIS — K219 Gastro-esophageal reflux disease without esophagitis: Secondary | ICD-10-CM | POA: Diagnosis not present

## 2021-08-10 DIAGNOSIS — I10 Essential (primary) hypertension: Secondary | ICD-10-CM | POA: Diagnosis not present

## 2021-08-10 DIAGNOSIS — F0284 Dementia in other diseases classified elsewhere, unspecified severity, with anxiety: Secondary | ICD-10-CM | POA: Diagnosis not present

## 2021-08-10 DIAGNOSIS — F0282 Dementia in other diseases classified elsewhere, unspecified severity, with psychotic disturbance: Secondary | ICD-10-CM | POA: Diagnosis not present

## 2021-08-10 DIAGNOSIS — E039 Hypothyroidism, unspecified: Secondary | ICD-10-CM | POA: Diagnosis not present

## 2021-08-10 DIAGNOSIS — G309 Alzheimer's disease, unspecified: Secondary | ICD-10-CM | POA: Diagnosis not present

## 2021-08-10 DIAGNOSIS — C9000 Multiple myeloma not having achieved remission: Secondary | ICD-10-CM | POA: Diagnosis not present

## 2021-08-10 DIAGNOSIS — F0280 Dementia in other diseases classified elsewhere without behavioral disturbance: Secondary | ICD-10-CM | POA: Diagnosis not present

## 2021-08-10 DIAGNOSIS — E782 Mixed hyperlipidemia: Secondary | ICD-10-CM | POA: Diagnosis not present

## 2021-08-10 DIAGNOSIS — F0390 Unspecified dementia without behavioral disturbance: Secondary | ICD-10-CM | POA: Diagnosis not present

## 2021-08-11 DIAGNOSIS — Z85820 Personal history of malignant melanoma of skin: Secondary | ICD-10-CM | POA: Diagnosis not present

## 2021-08-11 DIAGNOSIS — F0282 Dementia in other diseases classified elsewhere, unspecified severity, with psychotic disturbance: Secondary | ICD-10-CM | POA: Diagnosis not present

## 2021-08-11 DIAGNOSIS — K219 Gastro-esophageal reflux disease without esophagitis: Secondary | ICD-10-CM | POA: Diagnosis not present

## 2021-08-11 DIAGNOSIS — Z8616 Personal history of COVID-19: Secondary | ICD-10-CM | POA: Diagnosis not present

## 2021-08-11 DIAGNOSIS — E039 Hypothyroidism, unspecified: Secondary | ICD-10-CM | POA: Diagnosis not present

## 2021-08-11 DIAGNOSIS — Z79899 Other long term (current) drug therapy: Secondary | ICD-10-CM | POA: Diagnosis not present

## 2021-08-11 DIAGNOSIS — Z85850 Personal history of malignant neoplasm of thyroid: Secondary | ICD-10-CM | POA: Diagnosis not present

## 2021-08-11 DIAGNOSIS — R0981 Nasal congestion: Secondary | ICD-10-CM | POA: Diagnosis not present

## 2021-08-11 DIAGNOSIS — F0284 Dementia in other diseases classified elsewhere, unspecified severity, with anxiety: Secondary | ICD-10-CM | POA: Diagnosis not present

## 2021-08-11 DIAGNOSIS — C9002 Multiple myeloma in relapse: Secondary | ICD-10-CM | POA: Diagnosis not present

## 2021-08-11 DIAGNOSIS — Z95828 Presence of other vascular implants and grafts: Secondary | ICD-10-CM | POA: Diagnosis not present

## 2021-08-11 DIAGNOSIS — G309 Alzheimer's disease, unspecified: Secondary | ICD-10-CM | POA: Diagnosis not present

## 2021-08-11 DIAGNOSIS — E785 Hyperlipidemia, unspecified: Secondary | ICD-10-CM | POA: Diagnosis not present

## 2021-08-11 DIAGNOSIS — C9000 Multiple myeloma not having achieved remission: Secondary | ICD-10-CM | POA: Diagnosis not present

## 2021-08-11 DIAGNOSIS — F03918 Unspecified dementia, unspecified severity, with other behavioral disturbance: Secondary | ICD-10-CM | POA: Diagnosis not present

## 2021-08-11 DIAGNOSIS — F02811 Dementia in other diseases classified elsewhere, unspecified severity, with agitation: Secondary | ICD-10-CM | POA: Diagnosis not present

## 2021-08-11 DIAGNOSIS — I10 Essential (primary) hypertension: Secondary | ICD-10-CM | POA: Diagnosis not present

## 2021-08-12 DIAGNOSIS — I10 Essential (primary) hypertension: Secondary | ICD-10-CM | POA: Diagnosis not present

## 2021-08-12 DIAGNOSIS — K219 Gastro-esophageal reflux disease without esophagitis: Secondary | ICD-10-CM | POA: Diagnosis not present

## 2021-08-12 DIAGNOSIS — C9000 Multiple myeloma not having achieved remission: Secondary | ICD-10-CM | POA: Diagnosis not present

## 2021-08-12 DIAGNOSIS — F02811 Dementia in other diseases classified elsewhere, unspecified severity, with agitation: Secondary | ICD-10-CM | POA: Diagnosis not present

## 2021-08-12 DIAGNOSIS — F411 Generalized anxiety disorder: Secondary | ICD-10-CM | POA: Diagnosis not present

## 2021-08-12 DIAGNOSIS — H0100B Unspecified blepharitis left eye, upper and lower eyelids: Secondary | ICD-10-CM | POA: Diagnosis not present

## 2021-08-12 DIAGNOSIS — G309 Alzheimer's disease, unspecified: Secondary | ICD-10-CM | POA: Diagnosis not present

## 2021-08-12 DIAGNOSIS — Z961 Presence of intraocular lens: Secondary | ICD-10-CM | POA: Diagnosis not present

## 2021-08-12 DIAGNOSIS — E039 Hypothyroidism, unspecified: Secondary | ICD-10-CM | POA: Diagnosis not present

## 2021-08-12 DIAGNOSIS — H524 Presbyopia: Secondary | ICD-10-CM | POA: Diagnosis not present

## 2021-08-12 DIAGNOSIS — H5203 Hypermetropia, bilateral: Secondary | ICD-10-CM | POA: Diagnosis not present

## 2021-08-12 DIAGNOSIS — F0282 Dementia in other diseases classified elsewhere, unspecified severity, with psychotic disturbance: Secondary | ICD-10-CM | POA: Diagnosis not present

## 2021-08-12 DIAGNOSIS — E785 Hyperlipidemia, unspecified: Secondary | ICD-10-CM | POA: Diagnosis not present

## 2021-08-12 DIAGNOSIS — H31001 Unspecified chorioretinal scars, right eye: Secondary | ICD-10-CM | POA: Diagnosis not present

## 2021-08-12 DIAGNOSIS — F0284 Dementia in other diseases classified elsewhere, unspecified severity, with anxiety: Secondary | ICD-10-CM | POA: Diagnosis not present

## 2021-08-13 DIAGNOSIS — D0361 Melanoma in situ of right upper limb, including shoulder: Secondary | ICD-10-CM | POA: Diagnosis not present

## 2021-08-13 DIAGNOSIS — D492 Neoplasm of unspecified behavior of bone, soft tissue, and skin: Secondary | ICD-10-CM | POA: Diagnosis not present

## 2021-08-13 DIAGNOSIS — L821 Other seborrheic keratosis: Secondary | ICD-10-CM | POA: Diagnosis not present

## 2021-08-13 DIAGNOSIS — L820 Inflamed seborrheic keratosis: Secondary | ICD-10-CM | POA: Diagnosis not present

## 2021-08-13 DIAGNOSIS — Z85820 Personal history of malignant melanoma of skin: Secondary | ICD-10-CM | POA: Diagnosis not present

## 2021-08-13 DIAGNOSIS — L578 Other skin changes due to chronic exposure to nonionizing radiation: Secondary | ICD-10-CM | POA: Diagnosis not present

## 2021-08-16 DIAGNOSIS — R102 Pelvic and perineal pain: Secondary | ICD-10-CM | POA: Diagnosis not present

## 2021-08-16 DIAGNOSIS — M545 Low back pain, unspecified: Secondary | ICD-10-CM | POA: Diagnosis not present

## 2021-08-16 DIAGNOSIS — R2681 Unsteadiness on feet: Secondary | ICD-10-CM | POA: Diagnosis not present

## 2021-08-17 DIAGNOSIS — F0282 Dementia in other diseases classified elsewhere, unspecified severity, with psychotic disturbance: Secondary | ICD-10-CM | POA: Diagnosis not present

## 2021-08-17 DIAGNOSIS — K219 Gastro-esophageal reflux disease without esophagitis: Secondary | ICD-10-CM | POA: Diagnosis not present

## 2021-08-17 DIAGNOSIS — E039 Hypothyroidism, unspecified: Secondary | ICD-10-CM | POA: Diagnosis not present

## 2021-08-17 DIAGNOSIS — F02811 Dementia in other diseases classified elsewhere, unspecified severity, with agitation: Secondary | ICD-10-CM | POA: Diagnosis not present

## 2021-08-17 DIAGNOSIS — G309 Alzheimer's disease, unspecified: Secondary | ICD-10-CM | POA: Diagnosis not present

## 2021-08-17 DIAGNOSIS — C9000 Multiple myeloma not having achieved remission: Secondary | ICD-10-CM | POA: Diagnosis not present

## 2021-08-17 DIAGNOSIS — E785 Hyperlipidemia, unspecified: Secondary | ICD-10-CM | POA: Diagnosis not present

## 2021-08-17 DIAGNOSIS — F0284 Dementia in other diseases classified elsewhere, unspecified severity, with anxiety: Secondary | ICD-10-CM | POA: Diagnosis not present

## 2021-08-17 DIAGNOSIS — I10 Essential (primary) hypertension: Secondary | ICD-10-CM | POA: Diagnosis not present

## 2021-08-20 DIAGNOSIS — D0361 Melanoma in situ of right upper limb, including shoulder: Secondary | ICD-10-CM | POA: Diagnosis not present

## 2021-08-24 DIAGNOSIS — I10 Essential (primary) hypertension: Secondary | ICD-10-CM | POA: Diagnosis not present

## 2021-08-24 DIAGNOSIS — F0282 Dementia in other diseases classified elsewhere, unspecified severity, with psychotic disturbance: Secondary | ICD-10-CM | POA: Diagnosis not present

## 2021-08-24 DIAGNOSIS — K219 Gastro-esophageal reflux disease without esophagitis: Secondary | ICD-10-CM | POA: Diagnosis not present

## 2021-08-24 DIAGNOSIS — C9000 Multiple myeloma not having achieved remission: Secondary | ICD-10-CM | POA: Diagnosis not present

## 2021-08-24 DIAGNOSIS — G309 Alzheimer's disease, unspecified: Secondary | ICD-10-CM | POA: Diagnosis not present

## 2021-08-24 DIAGNOSIS — E785 Hyperlipidemia, unspecified: Secondary | ICD-10-CM | POA: Diagnosis not present

## 2021-08-24 DIAGNOSIS — F411 Generalized anxiety disorder: Secondary | ICD-10-CM | POA: Diagnosis not present

## 2021-08-24 DIAGNOSIS — F0284 Dementia in other diseases classified elsewhere, unspecified severity, with anxiety: Secondary | ICD-10-CM | POA: Diagnosis not present

## 2021-08-24 DIAGNOSIS — E039 Hypothyroidism, unspecified: Secondary | ICD-10-CM | POA: Diagnosis not present

## 2021-08-24 DIAGNOSIS — F02811 Dementia in other diseases classified elsewhere, unspecified severity, with agitation: Secondary | ICD-10-CM | POA: Diagnosis not present

## 2021-08-25 DIAGNOSIS — C9002 Multiple myeloma in relapse: Secondary | ICD-10-CM | POA: Diagnosis not present

## 2021-08-25 DIAGNOSIS — Z5111 Encounter for antineoplastic chemotherapy: Secondary | ICD-10-CM | POA: Diagnosis not present

## 2021-08-25 DIAGNOSIS — Z95828 Presence of other vascular implants and grafts: Secondary | ICD-10-CM | POA: Diagnosis not present

## 2021-08-31 DIAGNOSIS — K219 Gastro-esophageal reflux disease without esophagitis: Secondary | ICD-10-CM | POA: Diagnosis not present

## 2021-08-31 DIAGNOSIS — F0282 Dementia in other diseases classified elsewhere, unspecified severity, with psychotic disturbance: Secondary | ICD-10-CM | POA: Diagnosis not present

## 2021-08-31 DIAGNOSIS — F0284 Dementia in other diseases classified elsewhere, unspecified severity, with anxiety: Secondary | ICD-10-CM | POA: Diagnosis not present

## 2021-08-31 DIAGNOSIS — E039 Hypothyroidism, unspecified: Secondary | ICD-10-CM | POA: Diagnosis not present

## 2021-08-31 DIAGNOSIS — G309 Alzheimer's disease, unspecified: Secondary | ICD-10-CM | POA: Diagnosis not present

## 2021-08-31 DIAGNOSIS — C9000 Multiple myeloma not having achieved remission: Secondary | ICD-10-CM | POA: Diagnosis not present

## 2021-08-31 DIAGNOSIS — E785 Hyperlipidemia, unspecified: Secondary | ICD-10-CM | POA: Diagnosis not present

## 2021-08-31 DIAGNOSIS — F02811 Dementia in other diseases classified elsewhere, unspecified severity, with agitation: Secondary | ICD-10-CM | POA: Diagnosis not present

## 2021-08-31 DIAGNOSIS — I10 Essential (primary) hypertension: Secondary | ICD-10-CM | POA: Diagnosis not present

## 2021-09-01 DIAGNOSIS — C9000 Multiple myeloma not having achieved remission: Secondary | ICD-10-CM | POA: Diagnosis not present

## 2021-09-01 DIAGNOSIS — L989 Disorder of the skin and subcutaneous tissue, unspecified: Secondary | ICD-10-CM | POA: Diagnosis not present

## 2021-09-01 DIAGNOSIS — G309 Alzheimer's disease, unspecified: Secondary | ICD-10-CM | POA: Diagnosis not present

## 2021-09-07 DIAGNOSIS — E785 Hyperlipidemia, unspecified: Secondary | ICD-10-CM | POA: Diagnosis not present

## 2021-09-07 DIAGNOSIS — F0284 Dementia in other diseases classified elsewhere, unspecified severity, with anxiety: Secondary | ICD-10-CM | POA: Diagnosis not present

## 2021-09-07 DIAGNOSIS — F0282 Dementia in other diseases classified elsewhere, unspecified severity, with psychotic disturbance: Secondary | ICD-10-CM | POA: Diagnosis not present

## 2021-09-07 DIAGNOSIS — I10 Essential (primary) hypertension: Secondary | ICD-10-CM | POA: Diagnosis not present

## 2021-09-07 DIAGNOSIS — K219 Gastro-esophageal reflux disease without esophagitis: Secondary | ICD-10-CM | POA: Diagnosis not present

## 2021-09-07 DIAGNOSIS — G309 Alzheimer's disease, unspecified: Secondary | ICD-10-CM | POA: Diagnosis not present

## 2021-09-07 DIAGNOSIS — E039 Hypothyroidism, unspecified: Secondary | ICD-10-CM | POA: Diagnosis not present

## 2021-09-07 DIAGNOSIS — F02811 Dementia in other diseases classified elsewhere, unspecified severity, with agitation: Secondary | ICD-10-CM | POA: Diagnosis not present

## 2021-09-07 DIAGNOSIS — C9000 Multiple myeloma not having achieved remission: Secondary | ICD-10-CM | POA: Diagnosis not present

## 2021-09-09 DIAGNOSIS — F411 Generalized anxiety disorder: Secondary | ICD-10-CM | POA: Diagnosis not present

## 2021-09-11 DIAGNOSIS — I10 Essential (primary) hypertension: Secondary | ICD-10-CM | POA: Diagnosis not present

## 2021-09-11 DIAGNOSIS — E039 Hypothyroidism, unspecified: Secondary | ICD-10-CM | POA: Diagnosis not present

## 2021-09-11 DIAGNOSIS — C9000 Multiple myeloma not having achieved remission: Secondary | ICD-10-CM | POA: Diagnosis not present

## 2021-09-11 DIAGNOSIS — K219 Gastro-esophageal reflux disease without esophagitis: Secondary | ICD-10-CM | POA: Diagnosis not present

## 2021-09-11 DIAGNOSIS — F0282 Dementia in other diseases classified elsewhere, unspecified severity, with psychotic disturbance: Secondary | ICD-10-CM | POA: Diagnosis not present

## 2021-09-11 DIAGNOSIS — F02811 Dementia in other diseases classified elsewhere, unspecified severity, with agitation: Secondary | ICD-10-CM | POA: Diagnosis not present

## 2021-09-11 DIAGNOSIS — E785 Hyperlipidemia, unspecified: Secondary | ICD-10-CM | POA: Diagnosis not present

## 2021-09-11 DIAGNOSIS — G309 Alzheimer's disease, unspecified: Secondary | ICD-10-CM | POA: Diagnosis not present

## 2021-09-11 DIAGNOSIS — F0284 Dementia in other diseases classified elsewhere, unspecified severity, with anxiety: Secondary | ICD-10-CM | POA: Diagnosis not present

## 2021-09-14 DIAGNOSIS — Z95828 Presence of other vascular implants and grafts: Secondary | ICD-10-CM | POA: Diagnosis not present

## 2021-09-14 DIAGNOSIS — Z85820 Personal history of malignant melanoma of skin: Secondary | ICD-10-CM | POA: Diagnosis not present

## 2021-09-14 DIAGNOSIS — C9002 Multiple myeloma in relapse: Secondary | ICD-10-CM | POA: Diagnosis not present

## 2021-09-14 DIAGNOSIS — Z79899 Other long term (current) drug therapy: Secondary | ICD-10-CM | POA: Diagnosis not present

## 2021-09-14 DIAGNOSIS — Z8616 Personal history of COVID-19: Secondary | ICD-10-CM | POA: Diagnosis not present

## 2021-09-14 DIAGNOSIS — Z85850 Personal history of malignant neoplasm of thyroid: Secondary | ICD-10-CM | POA: Diagnosis not present

## 2021-09-14 DIAGNOSIS — Z736 Limitation of activities due to disability: Secondary | ICD-10-CM | POA: Diagnosis not present

## 2021-09-14 DIAGNOSIS — F03918 Unspecified dementia, unspecified severity, with other behavioral disturbance: Secondary | ICD-10-CM | POA: Diagnosis not present

## 2021-09-16 DIAGNOSIS — I10 Essential (primary) hypertension: Secondary | ICD-10-CM | POA: Diagnosis not present

## 2021-09-16 DIAGNOSIS — E785 Hyperlipidemia, unspecified: Secondary | ICD-10-CM | POA: Diagnosis not present

## 2021-09-16 DIAGNOSIS — G309 Alzheimer's disease, unspecified: Secondary | ICD-10-CM | POA: Diagnosis not present

## 2021-09-16 DIAGNOSIS — F0284 Dementia in other diseases classified elsewhere, unspecified severity, with anxiety: Secondary | ICD-10-CM | POA: Diagnosis not present

## 2021-09-16 DIAGNOSIS — E039 Hypothyroidism, unspecified: Secondary | ICD-10-CM | POA: Diagnosis not present

## 2021-09-16 DIAGNOSIS — F02811 Dementia in other diseases classified elsewhere, unspecified severity, with agitation: Secondary | ICD-10-CM | POA: Diagnosis not present

## 2021-09-16 DIAGNOSIS — K219 Gastro-esophageal reflux disease without esophagitis: Secondary | ICD-10-CM | POA: Diagnosis not present

## 2021-09-16 DIAGNOSIS — F0282 Dementia in other diseases classified elsewhere, unspecified severity, with psychotic disturbance: Secondary | ICD-10-CM | POA: Diagnosis not present

## 2021-09-16 DIAGNOSIS — C9000 Multiple myeloma not having achieved remission: Secondary | ICD-10-CM | POA: Diagnosis not present

## 2021-09-17 DIAGNOSIS — F411 Generalized anxiety disorder: Secondary | ICD-10-CM | POA: Diagnosis not present

## 2021-09-17 DIAGNOSIS — F29 Unspecified psychosis not due to a substance or known physiological condition: Secondary | ICD-10-CM | POA: Diagnosis not present

## 2021-09-17 DIAGNOSIS — F03918 Unspecified dementia, unspecified severity, with other behavioral disturbance: Secondary | ICD-10-CM | POA: Diagnosis not present

## 2021-09-21 DIAGNOSIS — C9000 Multiple myeloma not having achieved remission: Secondary | ICD-10-CM | POA: Diagnosis not present

## 2021-09-21 DIAGNOSIS — E785 Hyperlipidemia, unspecified: Secondary | ICD-10-CM | POA: Diagnosis not present

## 2021-09-21 DIAGNOSIS — F02811 Dementia in other diseases classified elsewhere, unspecified severity, with agitation: Secondary | ICD-10-CM | POA: Diagnosis not present

## 2021-09-21 DIAGNOSIS — G309 Alzheimer's disease, unspecified: Secondary | ICD-10-CM | POA: Diagnosis not present

## 2021-09-21 DIAGNOSIS — K219 Gastro-esophageal reflux disease without esophagitis: Secondary | ICD-10-CM | POA: Diagnosis not present

## 2021-09-21 DIAGNOSIS — F0284 Dementia in other diseases classified elsewhere, unspecified severity, with anxiety: Secondary | ICD-10-CM | POA: Diagnosis not present

## 2021-09-21 DIAGNOSIS — E039 Hypothyroidism, unspecified: Secondary | ICD-10-CM | POA: Diagnosis not present

## 2021-09-21 DIAGNOSIS — I10 Essential (primary) hypertension: Secondary | ICD-10-CM | POA: Diagnosis not present

## 2021-09-21 DIAGNOSIS — F0282 Dementia in other diseases classified elsewhere, unspecified severity, with psychotic disturbance: Secondary | ICD-10-CM | POA: Diagnosis not present

## 2021-09-30 DIAGNOSIS — Z5111 Encounter for antineoplastic chemotherapy: Secondary | ICD-10-CM | POA: Diagnosis not present

## 2021-09-30 DIAGNOSIS — Z85820 Personal history of malignant melanoma of skin: Secondary | ICD-10-CM | POA: Diagnosis not present

## 2021-09-30 DIAGNOSIS — Z85850 Personal history of malignant neoplasm of thyroid: Secondary | ICD-10-CM | POA: Diagnosis not present

## 2021-09-30 DIAGNOSIS — C9002 Multiple myeloma in relapse: Secondary | ICD-10-CM | POA: Diagnosis not present

## 2021-09-30 DIAGNOSIS — Z95828 Presence of other vascular implants and grafts: Secondary | ICD-10-CM | POA: Diagnosis not present

## 2021-10-01 DIAGNOSIS — I10 Essential (primary) hypertension: Secondary | ICD-10-CM | POA: Diagnosis not present

## 2021-10-01 DIAGNOSIS — K219 Gastro-esophageal reflux disease without esophagitis: Secondary | ICD-10-CM | POA: Diagnosis not present

## 2021-10-01 DIAGNOSIS — E785 Hyperlipidemia, unspecified: Secondary | ICD-10-CM | POA: Diagnosis not present

## 2021-10-01 DIAGNOSIS — E039 Hypothyroidism, unspecified: Secondary | ICD-10-CM | POA: Diagnosis not present

## 2021-10-01 DIAGNOSIS — F0282 Dementia in other diseases classified elsewhere, unspecified severity, with psychotic disturbance: Secondary | ICD-10-CM | POA: Diagnosis not present

## 2021-10-01 DIAGNOSIS — F02811 Dementia in other diseases classified elsewhere, unspecified severity, with agitation: Secondary | ICD-10-CM | POA: Diagnosis not present

## 2021-10-01 DIAGNOSIS — C9000 Multiple myeloma not having achieved remission: Secondary | ICD-10-CM | POA: Diagnosis not present

## 2021-10-01 DIAGNOSIS — F0284 Dementia in other diseases classified elsewhere, unspecified severity, with anxiety: Secondary | ICD-10-CM | POA: Diagnosis not present

## 2021-10-01 DIAGNOSIS — G309 Alzheimer's disease, unspecified: Secondary | ICD-10-CM | POA: Diagnosis not present

## 2021-10-02 DIAGNOSIS — L603 Nail dystrophy: Secondary | ICD-10-CM | POA: Diagnosis not present

## 2021-10-02 DIAGNOSIS — M21611 Bunion of right foot: Secondary | ICD-10-CM | POA: Diagnosis not present

## 2021-10-02 DIAGNOSIS — B351 Tinea unguium: Secondary | ICD-10-CM | POA: Diagnosis not present

## 2021-10-02 DIAGNOSIS — M21622 Bunionette of left foot: Secondary | ICD-10-CM | POA: Diagnosis not present

## 2021-10-02 DIAGNOSIS — M19072 Primary osteoarthritis, left ankle and foot: Secondary | ICD-10-CM | POA: Diagnosis not present

## 2021-10-02 DIAGNOSIS — M21621 Bunionette of right foot: Secondary | ICD-10-CM | POA: Diagnosis not present

## 2021-10-02 DIAGNOSIS — L600 Ingrowing nail: Secondary | ICD-10-CM | POA: Diagnosis not present

## 2021-10-02 DIAGNOSIS — M19071 Primary osteoarthritis, right ankle and foot: Secondary | ICD-10-CM | POA: Diagnosis not present

## 2021-10-02 DIAGNOSIS — M792 Neuralgia and neuritis, unspecified: Secondary | ICD-10-CM | POA: Diagnosis not present

## 2021-10-06 DIAGNOSIS — E785 Hyperlipidemia, unspecified: Secondary | ICD-10-CM | POA: Diagnosis not present

## 2021-10-06 DIAGNOSIS — K219 Gastro-esophageal reflux disease without esophagitis: Secondary | ICD-10-CM | POA: Diagnosis not present

## 2021-10-06 DIAGNOSIS — E039 Hypothyroidism, unspecified: Secondary | ICD-10-CM | POA: Diagnosis not present

## 2021-10-06 DIAGNOSIS — F0282 Dementia in other diseases classified elsewhere, unspecified severity, with psychotic disturbance: Secondary | ICD-10-CM | POA: Diagnosis not present

## 2021-10-06 DIAGNOSIS — C9000 Multiple myeloma not having achieved remission: Secondary | ICD-10-CM | POA: Diagnosis not present

## 2021-10-06 DIAGNOSIS — I10 Essential (primary) hypertension: Secondary | ICD-10-CM | POA: Diagnosis not present

## 2021-10-06 DIAGNOSIS — F0284 Dementia in other diseases classified elsewhere, unspecified severity, with anxiety: Secondary | ICD-10-CM | POA: Diagnosis not present

## 2021-10-06 DIAGNOSIS — G309 Alzheimer's disease, unspecified: Secondary | ICD-10-CM | POA: Diagnosis not present

## 2021-10-06 DIAGNOSIS — F02811 Dementia in other diseases classified elsewhere, unspecified severity, with agitation: Secondary | ICD-10-CM | POA: Diagnosis not present

## 2021-10-07 DIAGNOSIS — F411 Generalized anxiety disorder: Secondary | ICD-10-CM | POA: Diagnosis not present

## 2021-10-11 DIAGNOSIS — C9000 Multiple myeloma not having achieved remission: Secondary | ICD-10-CM | POA: Diagnosis not present

## 2021-10-11 DIAGNOSIS — I10 Essential (primary) hypertension: Secondary | ICD-10-CM | POA: Diagnosis not present

## 2021-10-11 DIAGNOSIS — E785 Hyperlipidemia, unspecified: Secondary | ICD-10-CM | POA: Diagnosis not present

## 2021-10-11 DIAGNOSIS — G309 Alzheimer's disease, unspecified: Secondary | ICD-10-CM | POA: Diagnosis not present

## 2021-10-11 DIAGNOSIS — F02811 Dementia in other diseases classified elsewhere, unspecified severity, with agitation: Secondary | ICD-10-CM | POA: Diagnosis not present

## 2021-10-11 DIAGNOSIS — F0284 Dementia in other diseases classified elsewhere, unspecified severity, with anxiety: Secondary | ICD-10-CM | POA: Diagnosis not present

## 2021-10-11 DIAGNOSIS — F0282 Dementia in other diseases classified elsewhere, unspecified severity, with psychotic disturbance: Secondary | ICD-10-CM | POA: Diagnosis not present

## 2021-10-11 DIAGNOSIS — K219 Gastro-esophageal reflux disease without esophagitis: Secondary | ICD-10-CM | POA: Diagnosis not present

## 2021-10-11 DIAGNOSIS — E039 Hypothyroidism, unspecified: Secondary | ICD-10-CM | POA: Diagnosis not present

## 2021-10-12 DIAGNOSIS — C9000 Multiple myeloma not having achieved remission: Secondary | ICD-10-CM | POA: Diagnosis not present

## 2021-10-12 DIAGNOSIS — E785 Hyperlipidemia, unspecified: Secondary | ICD-10-CM | POA: Diagnosis not present

## 2021-10-12 DIAGNOSIS — Z95828 Presence of other vascular implants and grafts: Secondary | ICD-10-CM | POA: Diagnosis not present

## 2021-10-12 DIAGNOSIS — Z8616 Personal history of COVID-19: Secondary | ICD-10-CM | POA: Diagnosis not present

## 2021-10-12 DIAGNOSIS — F02811 Dementia in other diseases classified elsewhere, unspecified severity, with agitation: Secondary | ICD-10-CM | POA: Diagnosis not present

## 2021-10-12 DIAGNOSIS — Z79899 Other long term (current) drug therapy: Secondary | ICD-10-CM | POA: Diagnosis not present

## 2021-10-12 DIAGNOSIS — F03918 Unspecified dementia, unspecified severity, with other behavioral disturbance: Secondary | ICD-10-CM | POA: Diagnosis not present

## 2021-10-12 DIAGNOSIS — K219 Gastro-esophageal reflux disease without esophagitis: Secondary | ICD-10-CM | POA: Diagnosis not present

## 2021-10-12 DIAGNOSIS — Z736 Limitation of activities due to disability: Secondary | ICD-10-CM | POA: Diagnosis not present

## 2021-10-12 DIAGNOSIS — F0284 Dementia in other diseases classified elsewhere, unspecified severity, with anxiety: Secondary | ICD-10-CM | POA: Diagnosis not present

## 2021-10-12 DIAGNOSIS — G309 Alzheimer's disease, unspecified: Secondary | ICD-10-CM | POA: Diagnosis not present

## 2021-10-12 DIAGNOSIS — F0282 Dementia in other diseases classified elsewhere, unspecified severity, with psychotic disturbance: Secondary | ICD-10-CM | POA: Diagnosis not present

## 2021-10-12 DIAGNOSIS — I10 Essential (primary) hypertension: Secondary | ICD-10-CM | POA: Diagnosis not present

## 2021-10-12 DIAGNOSIS — Z85850 Personal history of malignant neoplasm of thyroid: Secondary | ICD-10-CM | POA: Diagnosis not present

## 2021-10-12 DIAGNOSIS — E039 Hypothyroidism, unspecified: Secondary | ICD-10-CM | POA: Diagnosis not present

## 2021-10-12 DIAGNOSIS — C9002 Multiple myeloma in relapse: Secondary | ICD-10-CM | POA: Diagnosis not present

## 2021-10-12 DIAGNOSIS — Z85820 Personal history of malignant melanoma of skin: Secondary | ICD-10-CM | POA: Diagnosis not present

## 2021-10-19 DIAGNOSIS — I10 Essential (primary) hypertension: Secondary | ICD-10-CM | POA: Diagnosis not present

## 2021-10-19 DIAGNOSIS — F0282 Dementia in other diseases classified elsewhere, unspecified severity, with psychotic disturbance: Secondary | ICD-10-CM | POA: Diagnosis not present

## 2021-10-19 DIAGNOSIS — E785 Hyperlipidemia, unspecified: Secondary | ICD-10-CM | POA: Diagnosis not present

## 2021-10-19 DIAGNOSIS — E039 Hypothyroidism, unspecified: Secondary | ICD-10-CM | POA: Diagnosis not present

## 2021-10-19 DIAGNOSIS — K219 Gastro-esophageal reflux disease without esophagitis: Secondary | ICD-10-CM | POA: Diagnosis not present

## 2021-10-19 DIAGNOSIS — C9000 Multiple myeloma not having achieved remission: Secondary | ICD-10-CM | POA: Diagnosis not present

## 2021-10-19 DIAGNOSIS — F0284 Dementia in other diseases classified elsewhere, unspecified severity, with anxiety: Secondary | ICD-10-CM | POA: Diagnosis not present

## 2021-10-19 DIAGNOSIS — G309 Alzheimer's disease, unspecified: Secondary | ICD-10-CM | POA: Diagnosis not present

## 2021-10-19 DIAGNOSIS — F02811 Dementia in other diseases classified elsewhere, unspecified severity, with agitation: Secondary | ICD-10-CM | POA: Diagnosis not present

## 2021-10-20 DIAGNOSIS — F411 Generalized anxiety disorder: Secondary | ICD-10-CM | POA: Diagnosis not present

## 2021-10-26 DIAGNOSIS — Z95828 Presence of other vascular implants and grafts: Secondary | ICD-10-CM | POA: Diagnosis not present

## 2021-10-26 DIAGNOSIS — C9002 Multiple myeloma in relapse: Secondary | ICD-10-CM | POA: Diagnosis not present

## 2021-10-26 DIAGNOSIS — Z5111 Encounter for antineoplastic chemotherapy: Secondary | ICD-10-CM | POA: Diagnosis not present

## 2021-10-28 DIAGNOSIS — F0284 Dementia in other diseases classified elsewhere, unspecified severity, with anxiety: Secondary | ICD-10-CM | POA: Diagnosis not present

## 2021-10-28 DIAGNOSIS — C9000 Multiple myeloma not having achieved remission: Secondary | ICD-10-CM | POA: Diagnosis not present

## 2021-10-28 DIAGNOSIS — E785 Hyperlipidemia, unspecified: Secondary | ICD-10-CM | POA: Diagnosis not present

## 2021-10-28 DIAGNOSIS — E039 Hypothyroidism, unspecified: Secondary | ICD-10-CM | POA: Diagnosis not present

## 2021-10-28 DIAGNOSIS — K219 Gastro-esophageal reflux disease without esophagitis: Secondary | ICD-10-CM | POA: Diagnosis not present

## 2021-10-28 DIAGNOSIS — I10 Essential (primary) hypertension: Secondary | ICD-10-CM | POA: Diagnosis not present

## 2021-10-28 DIAGNOSIS — F02811 Dementia in other diseases classified elsewhere, unspecified severity, with agitation: Secondary | ICD-10-CM | POA: Diagnosis not present

## 2021-10-28 DIAGNOSIS — F0282 Dementia in other diseases classified elsewhere, unspecified severity, with psychotic disturbance: Secondary | ICD-10-CM | POA: Diagnosis not present

## 2021-10-28 DIAGNOSIS — G309 Alzheimer's disease, unspecified: Secondary | ICD-10-CM | POA: Diagnosis not present

## 2021-11-02 DIAGNOSIS — F02811 Dementia in other diseases classified elsewhere, unspecified severity, with agitation: Secondary | ICD-10-CM | POA: Diagnosis not present

## 2021-11-02 DIAGNOSIS — G309 Alzheimer's disease, unspecified: Secondary | ICD-10-CM | POA: Diagnosis not present

## 2021-11-02 DIAGNOSIS — M792 Neuralgia and neuritis, unspecified: Secondary | ICD-10-CM | POA: Diagnosis not present

## 2021-11-02 DIAGNOSIS — M19072 Primary osteoarthritis, left ankle and foot: Secondary | ICD-10-CM | POA: Diagnosis not present

## 2021-11-02 DIAGNOSIS — M21622 Bunionette of left foot: Secondary | ICD-10-CM | POA: Diagnosis not present

## 2021-11-02 DIAGNOSIS — F0284 Dementia in other diseases classified elsewhere, unspecified severity, with anxiety: Secondary | ICD-10-CM | POA: Diagnosis not present

## 2021-11-02 DIAGNOSIS — Z85820 Personal history of malignant melanoma of skin: Secondary | ICD-10-CM | POA: Diagnosis not present

## 2021-11-02 DIAGNOSIS — I10 Essential (primary) hypertension: Secondary | ICD-10-CM | POA: Diagnosis not present

## 2021-11-02 DIAGNOSIS — F0282 Dementia in other diseases classified elsewhere, unspecified severity, with psychotic disturbance: Secondary | ICD-10-CM | POA: Diagnosis not present

## 2021-11-02 DIAGNOSIS — M21611 Bunion of right foot: Secondary | ICD-10-CM | POA: Diagnosis not present

## 2021-11-02 DIAGNOSIS — E039 Hypothyroidism, unspecified: Secondary | ICD-10-CM | POA: Diagnosis not present

## 2021-11-02 DIAGNOSIS — E785 Hyperlipidemia, unspecified: Secondary | ICD-10-CM | POA: Diagnosis not present

## 2021-11-02 DIAGNOSIS — K219 Gastro-esophageal reflux disease without esophagitis: Secondary | ICD-10-CM | POA: Diagnosis not present

## 2021-11-02 DIAGNOSIS — M19071 Primary osteoarthritis, right ankle and foot: Secondary | ICD-10-CM | POA: Diagnosis not present

## 2021-11-02 DIAGNOSIS — L600 Ingrowing nail: Secondary | ICD-10-CM | POA: Diagnosis not present

## 2021-11-02 DIAGNOSIS — L408 Other psoriasis: Secondary | ICD-10-CM | POA: Diagnosis not present

## 2021-11-02 DIAGNOSIS — M21621 Bunionette of right foot: Secondary | ICD-10-CM | POA: Diagnosis not present

## 2021-11-02 DIAGNOSIS — B351 Tinea unguium: Secondary | ICD-10-CM | POA: Diagnosis not present

## 2021-11-02 DIAGNOSIS — L603 Nail dystrophy: Secondary | ICD-10-CM | POA: Diagnosis not present

## 2021-11-02 DIAGNOSIS — C9000 Multiple myeloma not having achieved remission: Secondary | ICD-10-CM | POA: Diagnosis not present

## 2021-11-10 DIAGNOSIS — F411 Generalized anxiety disorder: Secondary | ICD-10-CM | POA: Diagnosis not present

## 2021-11-16 DIAGNOSIS — F03918 Unspecified dementia, unspecified severity, with other behavioral disturbance: Secondary | ICD-10-CM | POA: Diagnosis not present

## 2021-11-16 DIAGNOSIS — N179 Acute kidney failure, unspecified: Secondary | ICD-10-CM | POA: Diagnosis not present

## 2021-11-16 DIAGNOSIS — R7989 Other specified abnormal findings of blood chemistry: Secondary | ICD-10-CM | POA: Diagnosis not present

## 2021-11-16 DIAGNOSIS — Z79899 Other long term (current) drug therapy: Secondary | ICD-10-CM | POA: Diagnosis not present

## 2021-11-16 DIAGNOSIS — Z95828 Presence of other vascular implants and grafts: Secondary | ICD-10-CM | POA: Diagnosis not present

## 2021-11-16 DIAGNOSIS — R197 Diarrhea, unspecified: Secondary | ICD-10-CM | POA: Diagnosis not present

## 2021-11-16 DIAGNOSIS — C9000 Multiple myeloma not having achieved remission: Secondary | ICD-10-CM | POA: Diagnosis not present

## 2021-11-16 DIAGNOSIS — Z792 Long term (current) use of antibiotics: Secondary | ICD-10-CM | POA: Diagnosis not present

## 2021-11-16 DIAGNOSIS — C9002 Multiple myeloma in relapse: Secondary | ICD-10-CM | POA: Diagnosis not present

## 2021-11-16 DIAGNOSIS — Z8616 Personal history of COVID-19: Secondary | ICD-10-CM | POA: Diagnosis not present

## 2021-11-19 DIAGNOSIS — I10 Essential (primary) hypertension: Secondary | ICD-10-CM | POA: Diagnosis not present

## 2021-11-20 DIAGNOSIS — F411 Generalized anxiety disorder: Secondary | ICD-10-CM | POA: Diagnosis not present

## 2021-11-30 DIAGNOSIS — Z95828 Presence of other vascular implants and grafts: Secondary | ICD-10-CM | POA: Diagnosis not present

## 2021-11-30 DIAGNOSIS — Z5111 Encounter for antineoplastic chemotherapy: Secondary | ICD-10-CM | POA: Diagnosis not present

## 2021-11-30 DIAGNOSIS — C9002 Multiple myeloma in relapse: Secondary | ICD-10-CM | POA: Diagnosis not present

## 2021-12-14 DIAGNOSIS — C9002 Multiple myeloma in relapse: Secondary | ICD-10-CM | POA: Diagnosis not present

## 2021-12-14 DIAGNOSIS — R300 Dysuria: Secondary | ICD-10-CM | POA: Diagnosis not present

## 2021-12-14 DIAGNOSIS — Z95828 Presence of other vascular implants and grafts: Secondary | ICD-10-CM | POA: Diagnosis not present

## 2021-12-14 DIAGNOSIS — Z792 Long term (current) use of antibiotics: Secondary | ICD-10-CM | POA: Diagnosis not present

## 2021-12-14 DIAGNOSIS — Z79899 Other long term (current) drug therapy: Secondary | ICD-10-CM | POA: Diagnosis not present

## 2021-12-14 DIAGNOSIS — Z8616 Personal history of COVID-19: Secondary | ICD-10-CM | POA: Diagnosis not present

## 2021-12-14 DIAGNOSIS — C6931 Malignant neoplasm of right choroid: Secondary | ICD-10-CM | POA: Diagnosis not present

## 2021-12-14 DIAGNOSIS — Z85850 Personal history of malignant neoplasm of thyroid: Secondary | ICD-10-CM | POA: Diagnosis not present

## 2021-12-14 DIAGNOSIS — F419 Anxiety disorder, unspecified: Secondary | ICD-10-CM | POA: Diagnosis not present

## 2021-12-14 DIAGNOSIS — F03918 Unspecified dementia, unspecified severity, with other behavioral disturbance: Secondary | ICD-10-CM | POA: Diagnosis not present

## 2021-12-14 DIAGNOSIS — Z85820 Personal history of malignant melanoma of skin: Secondary | ICD-10-CM | POA: Diagnosis not present

## 2021-12-16 DIAGNOSIS — R8279 Other abnormal findings on microbiological examination of urine: Secondary | ICD-10-CM | POA: Diagnosis not present

## 2021-12-16 DIAGNOSIS — R8290 Unspecified abnormal findings in urine: Secondary | ICD-10-CM | POA: Diagnosis not present

## 2021-12-18 DIAGNOSIS — F4322 Adjustment disorder with anxiety: Secondary | ICD-10-CM | POA: Diagnosis not present

## 2021-12-28 DIAGNOSIS — C9002 Multiple myeloma in relapse: Secondary | ICD-10-CM | POA: Diagnosis not present

## 2021-12-28 DIAGNOSIS — Z5111 Encounter for antineoplastic chemotherapy: Secondary | ICD-10-CM | POA: Diagnosis not present

## 2022-01-05 DIAGNOSIS — G301 Alzheimer's disease with late onset: Secondary | ICD-10-CM | POA: Diagnosis not present

## 2022-01-05 DIAGNOSIS — F209 Schizophrenia, unspecified: Secondary | ICD-10-CM | POA: Diagnosis not present

## 2022-01-05 DIAGNOSIS — F02B18 Dementia in other diseases classified elsewhere, moderate, with other behavioral disturbance: Secondary | ICD-10-CM | POA: Diagnosis not present

## 2022-01-05 DIAGNOSIS — C73 Malignant neoplasm of thyroid gland: Secondary | ICD-10-CM | POA: Diagnosis not present

## 2022-01-11 DIAGNOSIS — Z792 Long term (current) use of antibiotics: Secondary | ICD-10-CM | POA: Diagnosis not present

## 2022-01-11 DIAGNOSIS — F03918 Unspecified dementia, unspecified severity, with other behavioral disturbance: Secondary | ICD-10-CM | POA: Diagnosis not present

## 2022-01-11 DIAGNOSIS — Z736 Limitation of activities due to disability: Secondary | ICD-10-CM | POA: Diagnosis not present

## 2022-01-11 DIAGNOSIS — F209 Schizophrenia, unspecified: Secondary | ICD-10-CM | POA: Diagnosis not present

## 2022-01-11 DIAGNOSIS — Z79899 Other long term (current) drug therapy: Secondary | ICD-10-CM | POA: Diagnosis not present

## 2022-01-11 DIAGNOSIS — Z95828 Presence of other vascular implants and grafts: Secondary | ICD-10-CM | POA: Diagnosis not present

## 2022-01-11 DIAGNOSIS — G629 Polyneuropathy, unspecified: Secondary | ICD-10-CM | POA: Diagnosis not present

## 2022-01-11 DIAGNOSIS — C9002 Multiple myeloma in relapse: Secondary | ICD-10-CM | POA: Diagnosis not present

## 2022-01-11 DIAGNOSIS — Z85820 Personal history of malignant melanoma of skin: Secondary | ICD-10-CM | POA: Diagnosis not present

## 2022-02-08 DIAGNOSIS — C9002 Multiple myeloma in relapse: Secondary | ICD-10-CM | POA: Diagnosis not present

## 2022-02-08 DIAGNOSIS — F209 Schizophrenia, unspecified: Secondary | ICD-10-CM | POA: Diagnosis not present

## 2022-02-08 DIAGNOSIS — Z95828 Presence of other vascular implants and grafts: Secondary | ICD-10-CM | POA: Diagnosis not present

## 2022-02-08 DIAGNOSIS — Z792 Long term (current) use of antibiotics: Secondary | ICD-10-CM | POA: Diagnosis not present

## 2022-02-08 DIAGNOSIS — F03918 Unspecified dementia, unspecified severity, with other behavioral disturbance: Secondary | ICD-10-CM | POA: Diagnosis not present

## 2022-02-08 DIAGNOSIS — T451X5A Adverse effect of antineoplastic and immunosuppressive drugs, initial encounter: Secondary | ICD-10-CM | POA: Diagnosis not present

## 2022-02-08 DIAGNOSIS — Z85820 Personal history of malignant melanoma of skin: Secondary | ICD-10-CM | POA: Diagnosis not present

## 2022-02-08 DIAGNOSIS — G629 Polyneuropathy, unspecified: Secondary | ICD-10-CM | POA: Diagnosis not present

## 2022-02-08 DIAGNOSIS — R112 Nausea with vomiting, unspecified: Secondary | ICD-10-CM | POA: Diagnosis not present

## 2022-02-08 DIAGNOSIS — Z79899 Other long term (current) drug therapy: Secondary | ICD-10-CM | POA: Diagnosis not present

## 2022-02-08 DIAGNOSIS — Z736 Limitation of activities due to disability: Secondary | ICD-10-CM | POA: Diagnosis not present

## 2022-02-17 DIAGNOSIS — C9002 Multiple myeloma in relapse: Secondary | ICD-10-CM | POA: Diagnosis not present

## 2022-02-17 DIAGNOSIS — Z5111 Encounter for antineoplastic chemotherapy: Secondary | ICD-10-CM | POA: Diagnosis not present

## 2022-02-24 DIAGNOSIS — Z5111 Encounter for antineoplastic chemotherapy: Secondary | ICD-10-CM | POA: Diagnosis not present

## 2022-02-24 DIAGNOSIS — C9002 Multiple myeloma in relapse: Secondary | ICD-10-CM | POA: Diagnosis not present

## 2022-03-02 DIAGNOSIS — R8279 Other abnormal findings on microbiological examination of urine: Secondary | ICD-10-CM | POA: Diagnosis not present

## 2022-03-02 DIAGNOSIS — R8290 Unspecified abnormal findings in urine: Secondary | ICD-10-CM | POA: Diagnosis not present

## 2022-03-03 DIAGNOSIS — Z5111 Encounter for antineoplastic chemotherapy: Secondary | ICD-10-CM | POA: Diagnosis not present

## 2022-03-03 DIAGNOSIS — C9002 Multiple myeloma in relapse: Secondary | ICD-10-CM | POA: Diagnosis not present

## 2022-03-04 DIAGNOSIS — D2270 Melanocytic nevi of unspecified lower limb, including hip: Secondary | ICD-10-CM | POA: Diagnosis not present

## 2022-03-04 DIAGNOSIS — L821 Other seborrheic keratosis: Secondary | ICD-10-CM | POA: Diagnosis not present

## 2022-03-04 DIAGNOSIS — Z85820 Personal history of malignant melanoma of skin: Secondary | ICD-10-CM | POA: Diagnosis not present

## 2022-03-04 DIAGNOSIS — D225 Melanocytic nevi of trunk: Secondary | ICD-10-CM | POA: Diagnosis not present

## 2022-03-04 DIAGNOSIS — D2260 Melanocytic nevi of unspecified upper limb, including shoulder: Secondary | ICD-10-CM | POA: Diagnosis not present

## 2022-03-04 DIAGNOSIS — L814 Other melanin hyperpigmentation: Secondary | ICD-10-CM | POA: Diagnosis not present

## 2022-03-04 DIAGNOSIS — L578 Other skin changes due to chronic exposure to nonionizing radiation: Secondary | ICD-10-CM | POA: Diagnosis not present

## 2022-03-04 DIAGNOSIS — D1801 Hemangioma of skin and subcutaneous tissue: Secondary | ICD-10-CM | POA: Diagnosis not present

## 2022-03-09 DIAGNOSIS — Z736 Limitation of activities due to disability: Secondary | ICD-10-CM | POA: Diagnosis not present

## 2022-03-09 DIAGNOSIS — F02B18 Dementia in other diseases classified elsewhere, moderate, with other behavioral disturbance: Secondary | ICD-10-CM | POA: Diagnosis not present

## 2022-03-09 DIAGNOSIS — F209 Schizophrenia, unspecified: Secondary | ICD-10-CM | POA: Diagnosis not present

## 2022-03-09 DIAGNOSIS — F02818 Dementia in other diseases classified elsewhere, unspecified severity, with other behavioral disturbance: Secondary | ICD-10-CM | POA: Diagnosis not present

## 2022-03-09 DIAGNOSIS — Z79899 Other long term (current) drug therapy: Secondary | ICD-10-CM | POA: Diagnosis not present

## 2022-03-09 DIAGNOSIS — G629 Polyneuropathy, unspecified: Secondary | ICD-10-CM | POA: Diagnosis not present

## 2022-03-09 DIAGNOSIS — C9002 Multiple myeloma in relapse: Secondary | ICD-10-CM | POA: Diagnosis not present

## 2022-03-09 DIAGNOSIS — G308 Other Alzheimer's disease: Secondary | ICD-10-CM | POA: Diagnosis not present

## 2022-03-09 DIAGNOSIS — Z8616 Personal history of COVID-19: Secondary | ICD-10-CM | POA: Diagnosis not present

## 2022-03-11 DIAGNOSIS — F209 Schizophrenia, unspecified: Secondary | ICD-10-CM | POA: Diagnosis not present

## 2022-03-11 DIAGNOSIS — F419 Anxiety disorder, unspecified: Secondary | ICD-10-CM | POA: Diagnosis not present

## 2022-03-11 DIAGNOSIS — R419 Unspecified symptoms and signs involving cognitive functions and awareness: Secondary | ICD-10-CM | POA: Diagnosis not present

## 2022-03-17 DIAGNOSIS — C9002 Multiple myeloma in relapse: Secondary | ICD-10-CM | POA: Diagnosis not present

## 2022-03-17 DIAGNOSIS — Z5111 Encounter for antineoplastic chemotherapy: Secondary | ICD-10-CM | POA: Diagnosis not present

## 2022-03-24 DIAGNOSIS — F02818 Dementia in other diseases classified elsewhere, unspecified severity, with other behavioral disturbance: Secondary | ICD-10-CM | POA: Diagnosis not present

## 2022-03-24 DIAGNOSIS — E860 Dehydration: Secondary | ICD-10-CM | POA: Diagnosis not present

## 2022-03-24 DIAGNOSIS — K591 Functional diarrhea: Secondary | ICD-10-CM | POA: Diagnosis not present

## 2022-03-24 DIAGNOSIS — F411 Generalized anxiety disorder: Secondary | ICD-10-CM | POA: Diagnosis not present

## 2022-03-24 DIAGNOSIS — R531 Weakness: Secondary | ICD-10-CM | POA: Diagnosis not present

## 2022-03-24 DIAGNOSIS — E039 Hypothyroidism, unspecified: Secondary | ICD-10-CM | POA: Diagnosis not present

## 2022-03-24 DIAGNOSIS — C9002 Multiple myeloma in relapse: Secondary | ICD-10-CM | POA: Diagnosis not present

## 2022-03-24 DIAGNOSIS — D708 Other neutropenia: Secondary | ICD-10-CM | POA: Diagnosis not present

## 2022-03-24 DIAGNOSIS — D709 Neutropenia, unspecified: Secondary | ICD-10-CM | POA: Diagnosis not present

## 2022-03-24 DIAGNOSIS — D649 Anemia, unspecified: Secondary | ICD-10-CM | POA: Diagnosis not present

## 2022-03-24 DIAGNOSIS — R197 Diarrhea, unspecified: Secondary | ICD-10-CM | POA: Diagnosis not present

## 2022-03-24 DIAGNOSIS — R404 Transient alteration of awareness: Secondary | ICD-10-CM | POA: Diagnosis not present

## 2022-03-24 DIAGNOSIS — E7800 Pure hypercholesterolemia, unspecified: Secondary | ICD-10-CM | POA: Diagnosis not present

## 2022-03-24 DIAGNOSIS — C9000 Multiple myeloma not having achieved remission: Secondary | ICD-10-CM | POA: Diagnosis not present

## 2022-03-24 DIAGNOSIS — M6281 Muscle weakness (generalized): Secondary | ICD-10-CM | POA: Diagnosis not present

## 2022-03-24 DIAGNOSIS — N179 Acute kidney failure, unspecified: Secondary | ICD-10-CM | POA: Diagnosis not present

## 2022-03-24 DIAGNOSIS — Z66 Do not resuscitate: Secondary | ICD-10-CM | POA: Diagnosis not present

## 2022-03-24 DIAGNOSIS — F03918 Unspecified dementia, unspecified severity, with other behavioral disturbance: Secondary | ICD-10-CM | POA: Diagnosis not present

## 2022-03-24 DIAGNOSIS — Z7409 Other reduced mobility: Secondary | ICD-10-CM | POA: Diagnosis not present

## 2022-03-24 DIAGNOSIS — G309 Alzheimer's disease, unspecified: Secondary | ICD-10-CM | POA: Diagnosis not present

## 2022-03-24 DIAGNOSIS — Z743 Need for continuous supervision: Secondary | ICD-10-CM | POA: Diagnosis not present

## 2022-03-24 DIAGNOSIS — F209 Schizophrenia, unspecified: Secondary | ICD-10-CM | POA: Diagnosis not present

## 2022-03-24 DIAGNOSIS — I10 Essential (primary) hypertension: Secondary | ICD-10-CM | POA: Diagnosis not present

## 2022-03-24 DIAGNOSIS — K529 Noninfective gastroenteritis and colitis, unspecified: Secondary | ICD-10-CM | POA: Diagnosis not present

## 2022-03-24 DIAGNOSIS — G301 Alzheimer's disease with late onset: Secondary | ICD-10-CM | POA: Diagnosis not present

## 2022-03-24 DIAGNOSIS — Z79899 Other long term (current) drug therapy: Secondary | ICD-10-CM | POA: Diagnosis not present

## 2022-03-24 DIAGNOSIS — Z961 Presence of intraocular lens: Secondary | ICD-10-CM | POA: Diagnosis not present

## 2022-03-24 DIAGNOSIS — F02C0 Dementia in other diseases classified elsewhere, severe, without behavioral disturbance, psychotic disturbance, mood disturbance, and anxiety: Secondary | ICD-10-CM | POA: Diagnosis not present

## 2022-03-24 DIAGNOSIS — E878 Other disorders of electrolyte and fluid balance, not elsewhere classified: Secondary | ICD-10-CM | POA: Diagnosis not present

## 2022-04-01 DIAGNOSIS — Z743 Need for continuous supervision: Secondary | ICD-10-CM | POA: Diagnosis not present

## 2022-04-01 DIAGNOSIS — D649 Anemia, unspecified: Secondary | ICD-10-CM | POA: Diagnosis not present

## 2022-04-01 DIAGNOSIS — F209 Schizophrenia, unspecified: Secondary | ICD-10-CM | POA: Diagnosis not present

## 2022-04-01 DIAGNOSIS — Z8673 Personal history of transient ischemic attack (TIA), and cerebral infarction without residual deficits: Secondary | ICD-10-CM | POA: Diagnosis not present

## 2022-04-01 DIAGNOSIS — N179 Acute kidney failure, unspecified: Secondary | ICD-10-CM | POA: Diagnosis not present

## 2022-04-01 DIAGNOSIS — R531 Weakness: Secondary | ICD-10-CM | POA: Diagnosis not present

## 2022-04-01 DIAGNOSIS — Z66 Do not resuscitate: Secondary | ICD-10-CM | POA: Diagnosis not present

## 2022-04-01 DIAGNOSIS — E119 Type 2 diabetes mellitus without complications: Secondary | ICD-10-CM | POA: Diagnosis not present

## 2022-04-01 DIAGNOSIS — Z79899 Other long term (current) drug therapy: Secondary | ICD-10-CM | POA: Diagnosis not present

## 2022-04-01 DIAGNOSIS — F02818 Dementia in other diseases classified elsewhere, unspecified severity, with other behavioral disturbance: Secondary | ICD-10-CM | POA: Diagnosis not present

## 2022-04-01 DIAGNOSIS — G301 Alzheimer's disease with late onset: Secondary | ICD-10-CM | POA: Diagnosis not present

## 2022-04-01 DIAGNOSIS — R404 Transient alteration of awareness: Secondary | ICD-10-CM | POA: Diagnosis not present

## 2022-04-01 DIAGNOSIS — Z961 Presence of intraocular lens: Secondary | ICD-10-CM | POA: Diagnosis not present

## 2022-04-01 DIAGNOSIS — K529 Noninfective gastroenteritis and colitis, unspecified: Secondary | ICD-10-CM | POA: Diagnosis not present

## 2022-04-01 DIAGNOSIS — F03918 Unspecified dementia, unspecified severity, with other behavioral disturbance: Secondary | ICD-10-CM | POA: Diagnosis not present

## 2022-04-01 DIAGNOSIS — G309 Alzheimer's disease, unspecified: Secondary | ICD-10-CM | POA: Diagnosis not present

## 2022-04-01 DIAGNOSIS — M6281 Muscle weakness (generalized): Secondary | ICD-10-CM | POA: Diagnosis not present

## 2022-04-01 DIAGNOSIS — F02C0 Dementia in other diseases classified elsewhere, severe, without behavioral disturbance, psychotic disturbance, mood disturbance, and anxiety: Secondary | ICD-10-CM | POA: Diagnosis not present

## 2022-04-01 DIAGNOSIS — F411 Generalized anxiety disorder: Secondary | ICD-10-CM | POA: Diagnosis not present

## 2022-04-01 DIAGNOSIS — C9000 Multiple myeloma not having achieved remission: Secondary | ICD-10-CM | POA: Diagnosis not present

## 2022-04-01 DIAGNOSIS — I10 Essential (primary) hypertension: Secondary | ICD-10-CM | POA: Diagnosis not present

## 2022-04-01 DIAGNOSIS — E039 Hypothyroidism, unspecified: Secondary | ICD-10-CM | POA: Diagnosis not present

## 2022-04-01 DIAGNOSIS — D709 Neutropenia, unspecified: Secondary | ICD-10-CM | POA: Diagnosis not present

## 2022-04-01 DIAGNOSIS — R197 Diarrhea, unspecified: Secondary | ICD-10-CM | POA: Diagnosis not present

## 2022-04-02 DIAGNOSIS — I10 Essential (primary) hypertension: Secondary | ICD-10-CM | POA: Diagnosis not present

## 2022-04-03 DIAGNOSIS — I10 Essential (primary) hypertension: Secondary | ICD-10-CM | POA: Diagnosis not present

## 2022-04-05 DIAGNOSIS — M6281 Muscle weakness (generalized): Secondary | ICD-10-CM | POA: Diagnosis not present

## 2022-04-05 DIAGNOSIS — I10 Essential (primary) hypertension: Secondary | ICD-10-CM | POA: Diagnosis not present

## 2022-04-07 DIAGNOSIS — E119 Type 2 diabetes mellitus without complications: Secondary | ICD-10-CM | POA: Diagnosis not present

## 2022-04-08 DIAGNOSIS — E119 Type 2 diabetes mellitus without complications: Secondary | ICD-10-CM | POA: Diagnosis not present

## 2022-04-09 DIAGNOSIS — E119 Type 2 diabetes mellitus without complications: Secondary | ICD-10-CM | POA: Diagnosis not present

## 2022-04-19 DIAGNOSIS — G301 Alzheimer's disease with late onset: Secondary | ICD-10-CM | POA: Diagnosis not present

## 2022-04-19 DIAGNOSIS — Z8673 Personal history of transient ischemic attack (TIA), and cerebral infarction without residual deficits: Secondary | ICD-10-CM | POA: Diagnosis not present

## 2022-04-19 DIAGNOSIS — R197 Diarrhea, unspecified: Secondary | ICD-10-CM | POA: Diagnosis not present

## 2022-04-19 DIAGNOSIS — F02C0 Dementia in other diseases classified elsewhere, severe, without behavioral disturbance, psychotic disturbance, mood disturbance, and anxiety: Secondary | ICD-10-CM | POA: Diagnosis not present

## 2022-04-19 DIAGNOSIS — C9000 Multiple myeloma not having achieved remission: Secondary | ICD-10-CM | POA: Diagnosis not present

## 2022-04-26 ENCOUNTER — Other Ambulatory Visit: Payer: Self-pay

## 2022-05-05 DIAGNOSIS — F0392 Unspecified dementia, unspecified severity, with psychotic disturbance: Secondary | ICD-10-CM | POA: Diagnosis not present

## 2022-05-05 DIAGNOSIS — R2689 Other abnormalities of gait and mobility: Secondary | ICD-10-CM | POA: Diagnosis not present

## 2022-05-05 DIAGNOSIS — M6281 Muscle weakness (generalized): Secondary | ICD-10-CM | POA: Diagnosis not present

## 2022-05-06 DIAGNOSIS — M6281 Muscle weakness (generalized): Secondary | ICD-10-CM | POA: Diagnosis not present

## 2022-05-06 DIAGNOSIS — F0392 Unspecified dementia, unspecified severity, with psychotic disturbance: Secondary | ICD-10-CM | POA: Diagnosis not present

## 2022-05-06 DIAGNOSIS — R2689 Other abnormalities of gait and mobility: Secondary | ICD-10-CM | POA: Diagnosis not present

## 2022-05-12 DIAGNOSIS — F0392 Unspecified dementia, unspecified severity, with psychotic disturbance: Secondary | ICD-10-CM | POA: Diagnosis not present

## 2022-05-12 DIAGNOSIS — M6281 Muscle weakness (generalized): Secondary | ICD-10-CM | POA: Diagnosis not present

## 2022-05-12 DIAGNOSIS — R2689 Other abnormalities of gait and mobility: Secondary | ICD-10-CM | POA: Diagnosis not present

## 2022-05-14 DIAGNOSIS — F0392 Unspecified dementia, unspecified severity, with psychotic disturbance: Secondary | ICD-10-CM | POA: Diagnosis not present

## 2022-05-14 DIAGNOSIS — R2689 Other abnormalities of gait and mobility: Secondary | ICD-10-CM | POA: Diagnosis not present

## 2022-05-14 DIAGNOSIS — M6281 Muscle weakness (generalized): Secondary | ICD-10-CM | POA: Diagnosis not present

## 2022-05-17 DIAGNOSIS — M6281 Muscle weakness (generalized): Secondary | ICD-10-CM | POA: Diagnosis not present

## 2022-05-17 DIAGNOSIS — F0392 Unspecified dementia, unspecified severity, with psychotic disturbance: Secondary | ICD-10-CM | POA: Diagnosis not present

## 2022-05-17 DIAGNOSIS — R2689 Other abnormalities of gait and mobility: Secondary | ICD-10-CM | POA: Diagnosis not present

## 2022-05-19 DIAGNOSIS — M6281 Muscle weakness (generalized): Secondary | ICD-10-CM | POA: Diagnosis not present

## 2022-05-19 DIAGNOSIS — R2689 Other abnormalities of gait and mobility: Secondary | ICD-10-CM | POA: Diagnosis not present

## 2022-05-19 DIAGNOSIS — F0392 Unspecified dementia, unspecified severity, with psychotic disturbance: Secondary | ICD-10-CM | POA: Diagnosis not present

## 2022-05-20 DIAGNOSIS — E119 Type 2 diabetes mellitus without complications: Secondary | ICD-10-CM | POA: Diagnosis not present

## 2022-05-20 DIAGNOSIS — I10 Essential (primary) hypertension: Secondary | ICD-10-CM | POA: Diagnosis not present

## 2022-05-26 DIAGNOSIS — M6281 Muscle weakness (generalized): Secondary | ICD-10-CM | POA: Diagnosis not present

## 2022-05-26 DIAGNOSIS — R2689 Other abnormalities of gait and mobility: Secondary | ICD-10-CM | POA: Diagnosis not present

## 2022-05-26 DIAGNOSIS — F0392 Unspecified dementia, unspecified severity, with psychotic disturbance: Secondary | ICD-10-CM | POA: Diagnosis not present

## 2022-05-31 DIAGNOSIS — M6281 Muscle weakness (generalized): Secondary | ICD-10-CM | POA: Diagnosis not present

## 2022-05-31 DIAGNOSIS — R2689 Other abnormalities of gait and mobility: Secondary | ICD-10-CM | POA: Diagnosis not present

## 2022-05-31 DIAGNOSIS — F0392 Unspecified dementia, unspecified severity, with psychotic disturbance: Secondary | ICD-10-CM | POA: Diagnosis not present

## 2022-06-03 DIAGNOSIS — R2689 Other abnormalities of gait and mobility: Secondary | ICD-10-CM | POA: Diagnosis not present

## 2022-06-03 DIAGNOSIS — F0392 Unspecified dementia, unspecified severity, with psychotic disturbance: Secondary | ICD-10-CM | POA: Diagnosis not present

## 2022-06-03 DIAGNOSIS — M6281 Muscle weakness (generalized): Secondary | ICD-10-CM | POA: Diagnosis not present

## 2022-08-06 ENCOUNTER — Other Ambulatory Visit: Payer: Self-pay

## 2022-12-03 DEATH — deceased
